# Patient Record
Sex: Male | Born: 1950 | Race: White | Hispanic: No | Marital: Single | Smoking: Former smoker
Health system: Southern US, Community
[De-identification: ages and names within clinical notes are randomized; demographics above are authoritative.]

## PROBLEM LIST (undated history)

## (undated) DIAGNOSIS — M3500 Sicca syndrome, unspecified: Secondary | ICD-10-CM

## (undated) DIAGNOSIS — E039 Hypothyroidism, unspecified: Secondary | ICD-10-CM

## (undated) DIAGNOSIS — I2510 Atherosclerotic heart disease of native coronary artery without angina pectoris: Secondary | ICD-10-CM

## (undated) DIAGNOSIS — J449 Chronic obstructive pulmonary disease, unspecified: Secondary | ICD-10-CM

## (undated) DIAGNOSIS — I2699 Other pulmonary embolism without acute cor pulmonale: Secondary | ICD-10-CM

## (undated) DIAGNOSIS — Z720 Tobacco use: Secondary | ICD-10-CM

## (undated) DIAGNOSIS — G7000 Myasthenia gravis without (acute) exacerbation: Secondary | ICD-10-CM

## (undated) DIAGNOSIS — J479 Bronchiectasis, uncomplicated: Secondary | ICD-10-CM

## (undated) DIAGNOSIS — K219 Gastro-esophageal reflux disease without esophagitis: Secondary | ICD-10-CM

## (undated) DIAGNOSIS — F418 Other specified anxiety disorders: Secondary | ICD-10-CM

## (undated) DIAGNOSIS — I951 Orthostatic hypotension: Secondary | ICD-10-CM

## (undated) HISTORY — PX: OTHER SURGICAL HISTORY: SHX169

---

## 2013-08-13 DIAGNOSIS — I951 Orthostatic hypotension: Secondary | ICD-10-CM | POA: Insufficient documentation

## 2013-12-28 ENCOUNTER — Inpatient Hospital Stay
Admit: 2013-12-28 | Discharge: 2013-12-30 | Discharge status: Other Acute Inpatient Hospital | Source: Other Acute Inpatient Hospital

## 2013-12-28 DIAGNOSIS — R55 Syncope and collapse: Secondary | ICD-10-CM | POA: Insufficient documentation

## 2013-12-28 DIAGNOSIS — M4802 Spinal stenosis, cervical region: Secondary | ICD-10-CM | POA: Insufficient documentation

## 2013-12-28 MED ORDER — venlafaxine (EFFEXOR) tablet 75 mg
75 | Freq: Three times a day (TID) | ORAL | Status: AC
Start: 2013-12-28 — End: 2013-12-29
  Administered 2013-12-29: 12:00:00 75 mg via ORAL

## 2013-12-28 MED ORDER — finasteride (PROSCAR) tablet 5 mg
5 | Freq: Every day | ORAL | Status: AC
Start: 2013-12-28 — End: 2013-12-30
  Administered 2013-12-29 – 2013-12-30 (×2): 5 mg via ORAL

## 2013-12-28 MED ORDER — albuterol 2.5 mg in 3 ml NS NEB inhalation
2.5 | RESPIRATORY_TRACT | Status: AC | PRN
Start: 2013-12-28 — End: 2013-12-30

## 2013-12-28 MED ORDER — pyridostigmine (MESTINON) tablet 30 mg
60 | Freq: Three times a day (TID) | ORAL | Status: AC
Start: 2013-12-28 — End: 2013-12-30
  Administered 2013-12-29 – 2013-12-30 (×6): 30 mg via ORAL

## 2013-12-28 MED ORDER — heparin (porcine) injection 5,000 Units
5000 | Freq: Three times a day (TID) | INTRAMUSCULAR | Status: AC
Start: 2013-12-28 — End: 2013-12-30
  Administered 2013-12-29 (×2): 5000 [IU] via SUBCUTANEOUS

## 2013-12-28 MED ORDER — sodium chloride 0.9 % flush 10 mL
INTRAMUSCULAR | Status: AC
Start: 2013-12-28 — End: 2013-12-30
  Administered 2013-12-29 – 2013-12-30 (×5): 10 mL via INTRAVENOUS

## 2013-12-28 MED ORDER — buPROPion (WELLBUTRIN) tablet 150 mg
75 | Freq: Two times a day (BID) | ORAL | Status: AC
Start: 2013-12-28 — End: 2013-12-30
  Administered 2013-12-29 – 2013-12-30 (×4): 150 mg via ORAL

## 2013-12-28 MED ORDER — montelukast (SINGULAIR) tablet 10 mg
10 | Freq: Every evening | ORAL | Status: AC
Start: 2013-12-28 — End: 2013-12-30
  Administered 2013-12-29 – 2013-12-30 (×2): 10 mg via ORAL

## 2013-12-28 MED ORDER — ondansetron (ZOFRAN) 4 mg/2 mL injection 4 mg
4 | Freq: Four times a day (QID) | INTRAMUSCULAR | Status: AC | PRN
Start: 2013-12-28 — End: 2013-12-30

## 2013-12-28 MED ORDER — mometasone-formoterol (DULERA HFA) 100-5 mcg/actuation inhaler 2 puff
100-5 | Freq: Two times a day (BID) | RESPIRATORY_TRACT | Status: AC
Start: 2013-12-28 — End: 2013-12-30
  Administered 2013-12-29 – 2013-12-30 (×4): 2 via RESPIRATORY_TRACT

## 2013-12-28 MED ORDER — atorvastatin (LIPITOR) tablet 10 mg
10 | Freq: Every evening | ORAL | Status: AC
Start: 2013-12-28 — End: 2013-12-30
  Administered 2013-12-29 – 2013-12-30 (×2): 10 mg via ORAL

## 2013-12-28 MED ORDER — aspirin EC tablet 81 mg
81 | Freq: Every day | ORAL | Status: AC
Start: 2013-12-28 — End: 2013-12-30
  Administered 2013-12-29 – 2013-12-30 (×2): 81 mg via ORAL

## 2013-12-28 MED ORDER — azaTHIOprine (IMURAN) tablet 200 mg
50 | Freq: Every evening | ORAL | Status: AC
Start: 2013-12-28 — End: 2013-12-30
  Administered 2013-12-29 – 2013-12-30 (×2): 200 mg via ORAL

## 2013-12-28 MED ORDER — azaTHIOprine (IMURAN) tablet 150 mg
50 | Freq: Every evening | ORAL | Status: AC
Start: 2013-12-28 — End: 2013-12-28

## 2013-12-28 MED ORDER — ondansetron (ZOFRAN) tablet 4 mg
4 | Freq: Four times a day (QID) | ORAL | Status: AC | PRN
Start: 2013-12-28 — End: 2013-12-30

## 2013-12-28 MED ORDER — tamsulosin (FLOMAX) capsule Cp24 0.4 mg
0.4 | Freq: Every evening | ORAL | Status: AC
Start: 2013-12-28 — End: 2013-12-30
  Administered 2013-12-29 – 2013-12-30 (×2): 0.4 mg via ORAL

## 2013-12-28 MED ORDER — cyanocobalamin (VITAMIN B-12) tablet 1,000 mcg
1000 | Freq: Every day | ORAL | Status: AC
Start: 2013-12-28 — End: 2013-12-30
  Administered 2013-12-29 – 2013-12-30 (×2): 1000 ug via ORAL

## 2013-12-28 MED ORDER — pantoprazole (PROTONIX) EC tablet 40 mg
40 | Freq: Every day | ORAL | Status: AC
Start: 2013-12-28 — End: 2013-12-30
  Administered 2013-12-29 – 2013-12-30 (×2): 40 mg via ORAL

## 2013-12-28 MED ORDER — levothyroxine (SYNTHROID, LEVOTHROID) tablet 224 mcg
112 | Freq: Every day | ORAL | Status: AC
Start: 2013-12-28 — End: 2013-12-30
  Administered 2013-12-29 – 2013-12-30 (×2): 224 ug via ORAL

## 2013-12-28 MED ORDER — midodrine (PROAMATINE) tablet 10 mg
10 | Freq: Three times a day (TID) | ORAL | Status: AC
Start: 2013-12-28 — End: 2013-12-30
  Administered 2013-12-29 – 2013-12-30 (×6): 10 mg via ORAL

## 2013-12-28 MED FILL — PYRIDOSTIGMINE BROMIDE 60 MG TABLET: 60 60 mg | ORAL | Qty: 1

## 2013-12-28 MED FILL — DULERA 100 MCG-5 MCG/ACTUATION HFA AEROSOL INHALER: 100-5 100-5 mcg/actuation | RESPIRATORY_TRACT | Qty: 8.8

## 2013-12-28 MED FILL — ATORVASTATIN 10 MG TABLET: 10 10 MG | ORAL | Qty: 1

## 2013-12-28 MED FILL — BUPROPION HCL 75 MG TABLET: 75 75 MG | ORAL | Qty: 2

## 2013-12-28 MED FILL — HEPARIN (PORCINE) 5,000 UNIT/ML INJECTION SOLUTION: 5000 5,000 unit/mL | INTRAMUSCULAR | Qty: 1

## 2013-12-28 MED FILL — TAMSULOSIN 0.4 MG CAPSULE: 0.4 0.4 mg | ORAL | Qty: 1

## 2013-12-28 MED FILL — LEVOTHYROXINE 112 MCG TABLET: 112 112 MCG | ORAL | Qty: 2

## 2013-12-28 MED FILL — MONTELUKAST 10 MG TABLET: 10 10 mg | ORAL | Qty: 1

## 2013-12-28 MED FILL — MIDODRINE 10 MG TABLET: 10 10 MG | ORAL | Qty: 1

## 2013-12-28 MED FILL — AZATHIOPRINE 50 MG TABLET: 50 50 mg | ORAL | Qty: 4

## 2013-12-28 NOTE — Unmapped (Signed)
Please see my note from today

## 2013-12-28 NOTE — Unmapped (Signed)
[facility]    Name: Andre Olson  MRN: [id number]  CSN: [id number]    Chief complaint:  Syncope    History of Present Illness:    Andre Olson is an 63 y.o. male  has a past medical history of Coronary artery disease; Cancer; BPH; COPD (chronic obstructive pulmonary disease); Hypothyroid; Myasthenia gravis; Orthostatic hypotension; Hypertension; GERD and Depression. admitted with orthostasis and cervical spine disease.    [facility] after noting abnl on cervical spine MRI.  He was initially seen for this 20 years ago in [place] but did not have sx.    He has been c/o orthostasis since March 2015 resulting in 4 episodes of LOC. Cardiac w/u incl ECHO, tilt table testing.  Was treated w midrine and cont to have sx so fluorinef was tried and had resulting high BP causing vision changes; did not feel well so came to [facility] after [facility] did not want to admit him.  He did start taking the effexor around the time his sx started or shortly after, 75 mg tid (short acting)    +fatigue, intermittent total bilateral upper arm numbness, stocking numbness of LE, tremor  -at the [facility] neuro was considering doing a     PMH:  ROS negative except for: noted above  PMH reviewed from resident note  PSH reviewed from resident note  [facility] reviewed from resident note  SOC: living situation  -tobacco: 36 pl yr, quit 2001  -alcohol: 12-14 cans of beer per week  -drug use: none  Meds   No current facility-administered medications on file prior to encounter.     No current outpatient prescriptions on file prior to encounter.     No Known Allergies      Vitals:     Temp:  [97.1 ??F (36.2 ??C)-97.6 ??F (36.4 ??C)] 97.6 ??F (36.4 ??C)  Heart Rate:  [58-66] 66  Resp:  [16-18] 16  BP: (118-140)/(63-75) 123/74 mmHg    Intake/Output Summary (Last 24 hours) at 12/29/13 1422  Last data filed at 12/29/13 1133   Gross per 24 hour   Intake    620 ml   Output   1500 ml      Net   -880 ml     EXAM: thin, NAD  ENT EOMI  SKIN: no rash or lesion  LYMPH: neg cervical, supraclavicular LAN  Neck: kyphosis moderate, scar over thyroid  Lungs:  BS normal  Heart: RRR, 2/6 sys murmur RUSB, no heart sounds  Abd soft, -HSM, nt nd  Ext: -cce  Neuro: DTRs symmetrical: 1+ biceps, 2+ patellar, plantar down going, no clocus, course tremor        Labs:    Laboratory:  Lab Results   Component Value Date    WBC 6.7 12/29/2013    HGB 15.2 12/29/2013    HCT 44.3 12/29/2013    MCV 94.1 12/29/2013    PLT 177 12/29/2013     Lab Results   Component Value Date    GLUCOSE 86 12/29/2013    CREATININE 1.10 12/29/2013    BUN 23 12/29/2013    NA 135 12/29/2013    K 4.0 12/29/2013    CL 101 12/29/2013    CO2 27 12/29/2013     Lab Results   Component Value Date    INR 1.0 12/29/2013    PROTIME 12.6 12/29/2013     12/27/13 MRA HEAD w/o Findings:  Brain MRI:   No acute territorial or brainstem infarcts are present. No focal areas of diffusion restriction are present. There is age-appropriate mild periventricular and subcortical T2 FLAIR hyperintensities consistent with chronic small vessel ischemic  disease. No significant abnormal white matter signal abnormalities are present. There is no evidence of cytotoxic or vasogenic edema. No acute intracerebral hemorrhage, mass effect or extra-axial fluid collection is present.     The ventricles and cisterns are normal in size, shape, and configuration. The corpus callosum, pituitary and the optic   chiasm appear normal. The sulci and gyri are unremarkable. The cerebellar tonsils are normal in position.  The anterior and posterior circulation arteries show expected normal flow voids. The dural venous sinuses are patent.  The orbital tissue planes are normal. The paranasal sinuses and the mastoid air cells are clear.   Brain MRA:   There is no ICA stenosis at the skull base. Both petrous and cavernous ICAs are normally patent. Both carotid termini and   paraclinoid ICAs are normally patent.  Both A1 and A2 ACAs are patent. The left A1 ACA is dominant. The anterior communicating artery is unremarkable. Both M1 and M2 MCAs are normally patent.  There is no MCA bifurcation aneurysm. The visualized posterior communicating arteries are unremarkable.  The right vertebral artery is moderately dominant. The left vertebral artery is diminutive. Both vertebral arteries join to form a normally patent basilar artery. There is no basilar apex aneurysm. Both posterior cerebral arteries are normally patent. There is no PICA aneurysm.   Neck MRA:   There is normal 3 vessel aortic arch. The great vessels arising from the arch shows no ostial stenosis. No evidence of subclavian artery stenosis. The bilateral common carotid arteries and both cervical internal carotid arteries are normally patent. There is no stenosis or occlusion. The right vertebral artery is moderately dominant. The left vertebral artery is markedly diminutive. Both vertebral arteries show normal antegrade flow. There is no flow-limiting vertebral or carotid artery dissection.   Impression:   There is no acute intracerebral hemorrhage, enhancing mass lesion or acute territorial infarct.   The anterior and posterior cerebral circulation arteries show no major branch occlusion.   The bilateral cervical internal carotid arteries show no hemodynamically significant focal diameter stenosis.   There is no flow-limiting vertebral or carotid artery dissection   12/27/13 XRAY CSPINE:Impression:   1. Anatomical alignment. Normal craniovertebral junction. No  acute fracture or traumatic subluxation.   2. 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   3. Upon flexion there is minimal 1 mm anterolisthesis of C2 on C3 likely on degenerative basis.   07/27/15MRI NECK:Findings: The alignment is anatomic. There is straightening of the cervical spine. The craniovertebral junction is normal. There is no tonsillar herniation.  The craniovertebral junction is normal. There is diffuse disc desiccation. Disc space narrowing is present at C5-C6 and C6-C7. There is no prevertebral soft tissue swelling. There is no acute fracture, traumatic listhesis or canal compromise by fracture.   C2-C3: There is mild right and moderate left foraminal stenosis due to uncovertebral DJD.   C3-C4: There is focal posterocentral disc protrusion compressing the thecal sac and mildly indenting the ventral aspect of the cord. There is no significant foraminal stenosis.   C4-C5: There is mild broad-based annular bulge. There is minor foraminal encroachment due to uncovertebral DJD. There is minimal 1 mm anterolisthesis of C4 in relation to C5.   C5-C6: There is posterior central osteophyte  compressing the thecal sac and indenting the ventral aspect of the cord. There is severe bilateral foraminal stenosis with probable impingement of the exiting C6 nerve roots on both sides. There is minimal 1 mm retrolisthesis of C5 in relation to C6 on degenerative basis.   C6-C7: There is posterior central osteophyte compressing the thecal sac and mildly indenting the ventral aspect of the cord.   There is no canal stenosis. There is severe bilateral foraminal stenosis due to uncovertebral DJD with possible impingement of the exiting C7 nerve roots. There is 2 mm retrolisthesis of C6 in relation to C7 on a degenerative basis.   C7-T1: Minor degenerative changes The cervical cord shows heterogenous intramedullary T2 signal. There is however no focal cord edema, syrinx cavitation or cord enlargement.   Impression:   Moderate multilevel spondyloarthrosis, most significant at C3-C4, C5-C6 and C6-C7 as described.   At C5-C6 and C6-C7 posterior central osteophytes are present resulting in spinal canal stenosis and mild indentation of the ventral aspect of the cord. There is foraminal stenosis with probable impingement of the exiting bilateral C6 and C7 nerve   roots.   There is no  acute fracture, traumatic subluxation or canal   compromise by fracture.         Assessment/Plan:      Principal Problem:    Syncope: cardiac w/u is negative, unlikely to be related to spinal disease if cord is not significantly affected.  Recommend d/c of effexor since 10% of patients will have orthostasis from this medication, observe off midodrine and florinef for now    Active Problems:    Stenosis, cervical spine, read as mild (see above) NS to see    Myasthenia gravis with exacerbation, adult form: care per [facility] neurology          I performed a history and physical examination of the patient and discussed the management with the residents and patient. I reviewed the resident's note and agree with the documented findings and plan of care.   Graylin Shiver, MD  12/29/2013  2:22 PM

## 2013-12-28 NOTE — Unmapped (Signed)
Pt admitted to [facility] to room 8034. Pt's vss, a/o, with no c/o pain at this time, will continue to monitor. Pt oriented to call light system and instructed to call for assistance.  Patient is resting in a low, locked bed with call light and bedside table within reach.

## 2013-12-28 NOTE — Unmapped (Signed)
[place] [facility]  Department of Medicine  History and Physical    Admitting Physician: Leia Alf, MD  Date of Admit: 12/28/2013    Subjective:     Chief Complaint:  63 yo male with PMH significant for seronegative Myasthenia Gravis presents as transfer from [place] with CC of syncope and bilateral arm numbness.     HPI: Patient endorses 4 episodes of full loss of consciousness for several seconds since March that seem to be progressing in severity and frequency. It is associated with positional changes as well as shortness of breath and vision problems similar to previous MG exacerbations. He was admitted to [facility] for 2 weeks for these episodes and diagnosed with orthostatic hypotension. Per patient, tilt table testing and multiple imaging studies including echos, carotids, MRI brain have all been normal. He was discharged on midodrine therapy but was later readmitted for a blood pressure of 64/45. Marland Kitchen He was started on florinef but developed a blood pressure of 198/107 with a headache, tingling around his scalp and back, and flushing and then was admitted to the [place]. At the [facility], he was worked up for potential causes of syncope including a MRI/MRA that revealed foraminal stenosis with probable impingement of the exiting bilateral C6 and C7 nerve roots. for which he transferred to [facility] for neurosurgical evaluation.     Patient continues to endorse neck pain with head rotation bilaterally and consistent orthostasis on standing. On further questioning, he reports a diagnosis of cervical stenosis in 1990s and followed with ortho in [place] for 5 years, but they said it wasn't severe enough for surgery. He endorses intermittent right arm numbness and weakness during this time.     He also endorses worsening of his double vision, which he states has been constant for years. Patient also thinks that his voice has become slowly softer and more hoarse in the last several  months in addition to increasing fatigue with deep inspiration/expiration. Stopped pyridostigmine 3 years ago because of associated nausea.       Past Medical History:    GERD  papillary thyroid CA s/p ablation  Hypothyroid  COPD  CAD (s/p stent to RCA 05/2012)  Ureteral stricture  BPH    Past Surgical History:    Ulnar nerve transposition - 2008 [facility] [place])  RCA stent placement - 2013  Dilation of Urethra   Ablation of Thyroid cancer - 1995    Medications:    1) AZATHIOPRINE TAB ACTIVE Strt:12-25-13   Give: 150 MG PO Q BEDTIME Stop:01-24-14     2) AZELASTINE INHL,NASAL ACTIVE Strt:12-25-13   Give: 2 PUFFS INH NAS BID Stop:01-24-14     3) BUDESONIDE/FORMOTEROL INHL,ORAL ACTIVE Strt:12-25-13   Give: 2 PUFFS INHL BID Stop:01-24-14     4) BUPROPION (SR 12-HR) TAB,SA ACTIVE Strt:12-27-13   Give: 150MG  PO BID Stop:01-24-14     5) CYANOCOBALAMIN TAB ACTIVE Strt:12-25-13   Give: PO QDAY Stop:01-24-14     6) FINASTERIDE TAB ACTIVE Strt:12-25-13   Give: 5MG  PO QDAY Stop:01-24-14      8) IPRATROPIUM SOLN,SPRAY,NASAL ACTIVE Strt:12-25-13   Give: 2 SPRAYS INH NAS TID Stop:01-01-14     9) LEVOTHYROXINE TAB ACTIVE Strt:12-26-13   Give: 0.224MG  PO QDAY7 Stop:01-25-14     10) MIDODRINE TAB ACTIVE Strt:12-25-13   Give: 10MG  PO TID Stop:01-24-14     11) MONTELUKAST TAB ACTIVE Strt:12-25-13   Give: 10MG  PO Q BEDTIME Stop:01-24-14      12) MULTIVITAMIN/OPHTH (  AREDS2) CAP/TAB ACTIVE Strt:12-25-13   Give: 1 CAP/TAB PO BID Stop:01-24-14   (FOR EYES)     13) PANTOPRAZOLE TAB,EC ACTIVE Strt:12-25-13   Give: 40MG  PO QDAY Stop:01-24-14     14) PRAVASTATIN TAB ACTIVE Strt:12-27-13   Give: 40MG  PO Q BEDTIME Stop:01-24-14     15) PYRIDOSTIGMINE TAB ACTIVE Strt:12-26-13   Give: 30MG  PO TID Stop:01-25-14     16) TAMSULOSIN CAP,ORAL ACTIVE Strt:12-25-13   Give: 0.4MG  PO Q BEDTIME Stop:01-24-14     17) VENLAFAXINE TAB ACTIVE Strt:12-25-13   Give: 75MG  PO TID Stop:01-24-14      Code Status:  Full Code    Allergies: NKDA    Social  history:   Living situation: Lives in [place], with his fiancee Debbie   Social/Occupation: Retired (but still mows lawns)   Tobacco:  1 ppd for 35 years   Alcohol: 1-2 beers per day   Illicit Drugs: none       Family history:  Non-contributory    Review of Systems:    Constitutional: Negative for fever, chills and unexpected weight change. +fatigue  Eyes: +blurry and double vision.   Respiratory: Baseline COPD shortness of breath, cough stable,   Cardiovascular: Intermittent chest pain with indigestion couple of times a month  Gastrointestinal: Negative for nausea, vomiting, constipation and blood in stool.  Genitourinary: Negative for hematuria.   Musculoskeletal: +Arthritis in thumbs; +Neck pain  Skin: Negative for rash and wound.   Neurological: +dizziness, syncope, light-headedness and headaches on positional changes      Objective:   Temp:  [97.5 ??F (36.4 ??C)] 97.5 ??F (36.4 ??C)  Heart Rate:  [65] 65  BP: (127)/(73) 127/73 mmHg  No intake or output data in the 24 hours ending 12/28/13 2013       General: Alert, well nourished, in no acute distress  HEENT: PERRL  Neck: Supple, no JVD  Lungs: Normal effort, CTAB, no rubs, crackles, or wheezes  Heart:  Regular rate and rhythm; Very soft heart sounds; +1/6 systolic murmur heard best over LSB. No carotid bruits appreciated  Abd: Abdomen soft and non-tender. BS+  Ext: No edema, 2+ pulses bilat in UE/LE.  Skin: Normal color, no rashes, 1 tattoo on each of forearms    Mental Status:     LOC/Orientation: Awake and alert. Oriented x4 to self, place, date and situation  Language/Speech: no aphasia and no dysarthria.    Cranial Nerve II - XII grossly intact    Motor:    R L   R L   Deltoid 5 5  Hip Flex 5 5   Biceps 5 5  Knee Ext 5 5   Triceps 5 5  Knee Flex 5 5   Wrist Ext 5 5  Plantar Flex 5 5   Wrist Flex 5 5  Ankle Dorsiflex 5 5   IO 5 5       Grip 5 5         Sensory: B/l sensation to pinprick diminished up to ankles; B/l sensation to light touch diminished up to  mid-shins. Endorses intermittent numbness and tingling in BUE that extends up to his shoulders  Coord: finger nose finger  normal bilateral, finger tapping normal bilateral and heel to shin ; somewhat limited to exertion fatigue. Mild tremor bilaterally with extended effort  Gait: Did not attempt gait testing. Became dizzy when transitioning to sitting position from supine position.    Deep Tendon Reflexes:    R L   Biceps 2  2   Patellar 2+ 2+   Achilles 2 2   Toes ? ?     Clonus: Normal tone, no clonus  Dysarthia:Hoarseness, soft speech upon long conversations    Laboratory Data: CBC, Renal Panel, EKG      Assessment & Plan:   This is a 63 y.o. male with PMH significant for seronegative Myasthenia Gravis presents as transfer from [place] with CC of syncope and bilateral arm numbness.       1. Orthostatic hypotension:  [facility]: patient's peripheral neuropathy and orthostatic hypotension may have an autoimmune etiology related to his MG.   -Per [facility] note: MG Ab panel, IVIG  -Neurology consulted; appreciate recommendations  -10 mg Midrodrine daily  -Possible discontinuation of venlafaxine for contribution to orthostasis    3. Syncope: Given association of position changes and hypotension, syncope most likely 2/2 orthostatic hypotension  -Consider Echo to r/o cardiogenic syncope  -f/u OSH records for tilt-table testing, carotid US    4. Cervical Stenosis: Endorses symmetric parasthesias in BUE with OSH MRI positive for cervical stenosis  -Neurosurgery consulted; Appreciate recs  -Monitor for focal neurologic deficit and vascular compromise    5. Myasthenia Gravis: diplopia, hypophonia that increases with sustained effort  -Appreciate Neurology Recs  -30 mg Pyridostigmine PO TID  -200 mg Azathioprine daily   -Zofran TID PRN  -obtain baseline Vital Capacity and Maximal Inspiratory Pressure  -Monitor for Myasthenic Crisis --> Dyspnea while supine, severe dysphagia, increased WOB ==> low threshold for intubation    6.  Peripheral Neuropathy  -w/u ongoing at [place]; f/u on TSH, SPEP, ANA, ESR, RPR, B12    7. Hypothyroid  -Continue Synthroid    8. Smoking Cessation  -Continue Wellbutrin    9. CAD  -Continue Statin  -Consider ASA    10. GERD  -Continue Protonix    11. BPH  -Continue Tamulosin, Finasteride     12. Depression  -Continue Venlafaxine    13. COPD  -Continue dulera, montelukast, +albuterol prn Q4    14. Vitamin B12 Deficiency  -Continue B12 dose    15. PPX  -SubQ Heparin, Fall Precautions      Damaris Geers,   8:13 PM, 12/28/2013

## 2013-12-28 NOTE — Unmapped (Signed)
[facility]  DEPARTMENT OF INTERNAL MEDICINE  HISTORY & PHYSICAL  Patient: Andre Olson  MRN: [id number]  Admitting Physician: Leia Alf, MD  Date of Admit: 12/28/2013    Chief Complaint     Cervical cord compression    History of Present Illness     Andre Olson is a 63 y.o. male with a history significant for seronegative Myasthenia Gravis, BPH, HTN and hx of orthostatic hypotension who is transferred from [facility] for neurosurgical evaluation of cervical cord compression.    Pt was being worked up and follow for orthostatic hypotension at [facility] by neurology. Recent admission to [facility] where he was diagnosed. He had 4 episodes with LOC for several seconds since 08/2013. It is associated with positional changes as well as shortness of breath and vision problems similar to previous MG exacerbations. At OSH he reports multiple tests including; TTE, tilt-table and MRI were within nl. He was discharged on midodrine and later readmitted for low blood pressures. Started on florinef. He monitors his BP at home and noted a bp >200/>100 and went to [facility] ED. Syncopal workup there was done including MRI/MRA which revealed likely impingement of b/l C6 and C7 nerve roots, for which he was transferred to [facility] for NSG eval.     He states he was previously diagnosed with cervical canal stenosis in the 90s in [place], but was not severe enough for surgery. Also hx of R arm numbness/tingling for which he underwent ulnar nerve release.       Review of Systems     Review of Systems   Constitutional: Positive for malaise/fatigue. Negative for fever, chills and weight loss.   HENT: Negative for nosebleeds and sore throat.    Eyes: Positive for blurred vision and double vision. Negative for pain.   Respiratory: Negative for cough, shortness of breath and wheezing.    Cardiovascular: Negative for chest pain, palpitations and orthopnea.   Gastrointestinal: Positive for heartburn. Negative for nausea,  vomiting, abdominal pain and diarrhea.   Genitourinary: Negative for dysuria, urgency and frequency.   Musculoskeletal: Positive for neck pain. Negative for back pain, joint pain and myalgias.   Skin: Negative for rash.   Neurological: Positive for dizziness, tingling and loss of consciousness. Negative for seizures and headaches.   Psychiatric/Behavioral: Negative for depression. The patient is not nervous/anxious.          Past Medical History     Past Medical History   Diagnosis Date   ??? Coronary artery disease    ??? Cancer        Past Surgical History     Past Surgical History   Procedure Laterality Date   ??? Total thyroidectomy  March 1995       Family History     Non-contribuatory    Social History     History     Social History   ??? Marital Status: Single     Spouse Name: N/A     Number of Children: N/A   ??? Years of Education: N/A     Occupational History   ??? Not on file.     Social History Main Topics   ??? Smoking status: Former Smoker -- 1.00 packs/day for 36 years     Types: Cigarettes     Quit date: 02/02/2000   ??? Smokeless tobacco: Not on file   ??? Alcohol Use: 7.2 - 8.4 oz/week     12-14 Cans of beer per week   ???  Drug Use: No   ??? Sexual Activity: Yes     Partners: Female     Other Topics Concern   ??? Not on file     Social History Narrative   ??? No narrative on file       Medications     Allergies:  No Known Allergies    Home Meds:  Prior to Admission medications    Medication Sig Start Date End Date Taking? Authorizing Provider   azaTHIOprine (IMURAN) 50 mg tablet Take 150 mg by mouth at bedtime.   Yes Historical Provider, MD   azelastine (ASTELIN) 137 mcg (0.1 %) nasal spray Use 2 sprays into each nostril 2 times a day. Use in each nostril as directed   Yes Historical Provider, MD   budesonide-formoterol (SYMBICORT) 80-4.5 mcg/actuation inhaler Inhale 2 puffs into the lungs 2 times a day.   Yes Historical Provider, MD   buPROPion SR (WELLBUTRIN SR) 150 MG tablet Take 150 mg by mouth 2 times a day.   Yes  Historical Provider, MD   cyanocobalamin (VITAMIN B-12) 1000 MCG tablet Take 1,000 mcg by mouth daily.   Yes Historical Provider, MD   finasteride (PROSCAR) 5 mg tablet Take 5 mg by mouth daily.   Yes Historical Provider, MD   ipratropium (ATROVENT) 0.03 % nasal spray Use 2 sprays into each nostril every 12 (twelve) hours.   Yes Historical Provider, MD   levothyroxine (SYNTHROID, LEVOTHROID) 112 MCG tablet Take 224 mcg by mouth daily.   Yes Historical Provider, MD   midodrine (PROAMATINE) 10 MG tablet Take 10 mg by mouth 3 times a day.   Yes Historical Provider, MD   montelukast (SINGULAIR) 10 mg tablet Take 10 mg by mouth at bedtime.   Yes Historical Provider, MD   multivitamin capsule Take 1 capsule by mouth daily.   Yes Historical Provider, MD   pantoprazole (PROTONIX) 40 MG tablet Take 40 mg by mouth every morning before breakfast.   Yes Historical Provider, MD   pravastatin (PRAVACHOL) 40 MG tablet Take 40 mg by mouth at bedtime.   Yes Historical Provider, MD   pyridostigmine (MESTINON) 60 mg tablet Take 60 mg by mouth 3 times a day.   Yes Historical Provider, MD   tamsulosin (FLOMAX) 0.4 mg Cp24 Take 0.4 mg by mouth at bedtime.   Yes Historical Provider, MD   venlafaxine (EFFEXOR) 75 MG tablet Take 75 mg by mouth 3 times a day.   Yes Historical Provider, MD        Inpatient Meds:  Scheduled:  ??? atorvastatin  10 mg Oral Nightly (2100)   ??? azaTHIOprine  200 mg Oral QHS   ??? buPROPion  150 mg Oral BID   ??? [START ON 12/29/2013] cyanocobalamin  1,000 mcg Oral Daily 0900   ??? [START ON 12/29/2013] finasteride  5 mg Oral Daily 0900   ??? heparin (porcine)  5,000 Units Subcutaneous Q8H   ??? [START ON 12/29/2013] levothyroxine  224 mcg Oral DAILY 0600   ??? midodrine  10 mg Oral TID   ??? mometasone-formoterol  2 puff Inhalation RT Q12H   ??? montelukast  10 mg Oral Nightly (2100)   ??? [START ON 12/29/2013] pantoprazole  40 mg Oral DAILY 0600   ??? pyridostigmine  30 mg Oral TID   ??? sodium chloride  10 mL Intravenous QS   ??? tamsulosin   0.4 mg Oral Nightly (2100)   ??? [START ON 12/29/2013] venlafaxine  75 mg Oral TID WC  Continuous:     [id number] 2.5 mg in 3 ml NS NEB inhalation, ondansetron, ondansetron    Vital Signs     Temp:  [97.5 ??F (36.4 ??C)] 97.5 ??F (36.4 ??C)  Heart Rate:  [65] 65  Resp:  [16] 16  BP: (127)/(73) 127/73 mmHg  No intake or output data in the 24 hours ending 12/28/13 2249    Physical Exam     Physical Exam   Constitutional: He is oriented to person, place, and time. He appears well-developed and well-nourished. No distress.   HENT:   Head: Normocephalic and atraumatic.   Mouth/Throat: No oropharyngeal exudate.   Eyes: EOM are normal. Pupils are equal, round, and reactive to light. No scleral icterus.   Neck: Normal range of motion. Neck supple. No JVD present. No thyromegaly present.   Cardiovascular: Normal rate and regular rhythm.    Murmur heard.  2/6 holosystolic murmur best heard of LUSB   Pulmonary/Chest: Effort normal and breath sounds normal. No respiratory distress. He has no wheezes.   Abdominal: Soft. Bowel sounds are normal. He exhibits no distension. There is no tenderness.   Musculoskeletal: Normal range of motion. He exhibits no tenderness.   Neurological: He is alert and oriented to person, place, and time. He has normal strength and normal reflexes. No cranial nerve deficit or sensory deficit. He exhibits normal muscle tone.   Skin: Skin is warm and dry. No rash noted.   Psychiatric: He has a normal mood and affect. His behavior is normal. Judgment normal.         Laboratory Data     CBC:   HCT: 44.4 (12/26/13 06:00)   HGB: 15.2 (12/26/13 06:00)   MCH: 31.9 (12/26/13 06:00)   MCHC: 34.2 (12/26/13 06:00)   MCV: 93.3 (12/26/13 06:00)   MPV: 9.4 (12/26/13 06:00)   PLT: 211 (12/26/13 06:00)   RBC: 4.76 (12/26/13 06:00)   RDW-CV: 12.4 (12/26/13 06:00)   WBC: 6.7 (12/26/13 06:00)     ANI GAP: 12 (11/16/12 06:00)   BUN: 10 (11/16/12 06:00)   CA: 8.3 (11/16/12 06:00)   CL: 110 (11/16/12 06:00)   CO2: 25  (11/16/12 06:00)   CREAT: 0.96 (11/16/12 06:00)   GLUCOSE: 93 (11/16/12 06:00)   K: 4.4 (11/16/12 06:00)   NA: 142 (11/16/12 06:00)   eGFR: >60 (11/16/12 06:00)   MG: 2.0 (12/26/13 06:00)   PO4: 3.7 (12/26/13 06:00)       Diagnostic Studies   From OSH  7/27/15MRI BRAIN AND STEM W/O Contrast:   12/27/13 MRA HEAD w/o Findings:   Brain MRI:   No acute territorial or brainstem infarcts are present. No focal   areas of diffusion restriction are present. There is   age-appropriate mild periventricular and subcortical T2 FLAIR   hyperintensities consistent with chronic small vessel ischemic   disease. No significant abnormal white matter signal   abnormalities are present. There is no evidence of cytotoxic or   vasogenic edema. No acute intracerebral hemorrhage, mass effect   or extra-axial fluid collection is present.   The ventricles and cisterns are normal in size, shape, and   configuration. The corpus callosum, pituitary and the optic   chiasm appear normal. The sulci and gyri are unremarkable. The   cerebellar tonsils are normal in position.   The anterior and posterior circulation arteries show expected   normal flow voids. The dural venous sinuses are patent.   The orbital tissue planes are normal. The paranasal sinuses and  the mastoid air cells are clear.   Brain MRA:   There is no ICA stenosis at the skull base. Both petrous and   cavernous ICAs are normally patent. Both carotid termini and   paraclinoid ICAs are normally patent. Both A1 and A2 ACAs are   patent. The left A1 ACA is dominant. The anterior communicating   artery is unremarkable. Both M1 and M2 MCAs are normally patent.   There is no MCA bifurcation aneurysm. The visualized posterior   communicating arteries are unremarkable.   The right vertebral artery is moderately dominant. The left   vertebral artery is diminutive. Both vertebral arteries join to   form a normally patent basilar artery. There is no basilar apex   aneurysm. Both posterior  cerebral arteries are normally patent.   There is no PICA aneurysm.   Neck MRA:   There is normal 3 vessel aortic arch. The great vessels arising   from the arch shows no ostial stenosis. No evidence of subclavian   artery stenosis. The bilateral common carotid arteries and both   cervical internal carotid arteries are normally patent. There is   no stenosis or occlusion. The right vertebral artery is   moderately dominant. The left vertebral artery is markedly   diminutive. Both vertebral arteries show normal antegrade flow.   There is no flow-limiting vertebral or carotid artery dissection.   Impression:   There is no acute intracerebral hemorrhage, enhancing mass lesion   or acute territorial infarct.   The anterior and posterior cerebral circulation arteries show no   major branch occlusion.   The bilateral cervical internal carotid arteries show no   hemodynamically significant focal diameter stenosis.   There is no flow-limiting vertebral or carotid artery dissection   12/27/13 XRAY CSPINE:Impression:   1. Anatomical alignment. Normal craniovertebral junction. No   acute fracture or traumatic subluxation.   2. 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   3. Upon flexion there is minimal 1 mm anterolisthesis of C2 on C3   likely on degenerative basis.   07/27/15MRI NECK:Findings: The alignment is anatomic. There is straightening of   the cervical spine. The craniovertebral junction is normal. There   is no tonsillar herniation. The craniovertebral junction is   normal. There is diffuse disc desiccation. Disc space narrowing   is present at C5-C6 and C6-C7. There is no prevertebral soft   tissue swelling.   There is no acute fracture, traumatic listhesis or canal   compromise by fracture.   C2-C3: There is mild right and moderate left foraminal stenosis   due to uncovertebral DJD.   C3-C4: There is focal posterocentral disc protrusion compressing   the thecal sac  and mildly indenting the ventral aspect of the   cord. There is no significant foraminal stenosis.   C4-C5: There is mild broad-based annular bulge. There is minor   foraminal encroachment due to uncovertebral DJD. There is minimal   1 mm anterolisthesis of C4 in relation to C5.   C5-C6: There is posterior central osteophyte compressing the   thecal sac and indenting the ventral aspect of the cord. There is   severe bilateral foraminal stenosis with probable impingement of   the exiting C6 nerve roots on both sides. There is minimal 1 mm   retrolisthesis of C5 in relation to C6 on degenerative basis.   C6-C7: There is posterior central osteophyte compressing the   thecal sac and mildly indenting  the ventral aspect of the cord.   There is no canal stenosis. There is severe bilateral foraminal   stenosis due to uncovertebral DJD with possible impingement of   the exiting C7 nerve roots. There is 2 mm retrolisthesis of C6 in   relation to C7 on a degenerative basis.   C7-T1: Minor degenerative changes   The cervical cord shows heterogenous intramedullary T2 signal.   There is however no focal cord edema, syrinx cavitation or cord   enlargement.   Impression:   Moderate multilevel spondyloarthrosis, most significant at C3-C4,   C5-C6 and C6-C7 as described.   At C5-C6 and C6-C7 posterior central osteophytes are present   resulting in spinal canal stenosis and mild indentation of the   ventral aspect of the cord. There is foraminal stenosis with   probable impingement of the exiting bilateral C6 and C7 nerve   roots.   There is no acute fracture, traumatic subluxation or canal   compromise by fracture.         Assessment & Plan     Andre Olson is a 63 y.o. male with Syncope. Medical problems being addressed in this encounter include the following:    Active Hospital Problems    Diagnosis Date Noted   ??? Syncope [780.2] 12/28/2013   ??? 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 Myasthenia gravis with  exacerbation, adult form [358.01] 12/28/2013      Resolved Hospital Problems    Diagnosis Date Noted Date Resolved   No resolved problems to display.     #Orthostatic hypotension  Per neuro, possibly autoimmune component a/w his myasthenia.   - Obtain outside imaging and TTE records  - Neurology consulted, appreciate recs  - continue midodine 10mg  qday  - zofran prn    #Cervical stenosis, with cord impingement  Seen on imaging incidentally at OSH  - NSG consulted, appreciate recs  - serial neuro checks    #Myasthenia Gravis  - Neurology consulted, appreciate recs  - cont Pyridostigmine 30mg  po tid, discuss with neuro increasing dose  - azathioprine 200mg  qhs  - bedside PFTs  - IS, ezpap    #Periphreal neuropathy  Unclear if 2/t cord impingement  - F/u Labs at [facility];  TSH, SPEP, ANA, ESR, RPR, B12    #COPD  Not in exacerbation  - cont home symbicort (therapeutic interchange), singular  - albuterol nebs q4h prn    #hypothyroidism  - cont synthroid    #CAD  - asa 81 daily, lipitor 10 qhs    #Depression  - cont venlafaxine    #GERD  - cont home ppi    #Vitamin b12 deficiency  - cont home b12 supplement     #BPH  - cont home tamulosin, finasteride    #Tobacco abuse  Counseled on tobacco cessation.   - cont Wellbutrin       Nutrition:   Diet Orders         Diet regular starting at 07/28 2114          Code Status:  Full Code      Signed:  Josue Hector, MD  Internal Medicine Resident  12/28/2013, 10:49 PM

## 2013-12-29 ENCOUNTER — Inpatient Hospital Stay: Admit: 2013-12-29 | Attending: Student in an Organized Health Care Education/Training Program

## 2013-12-29 LAB — CBC
Hematocrit: 44.3 % (ref 38.5–50.0)
Hemoglobin: 15.2 g/dL (ref 13.2–17.1)
MCH: 32.2 pg (ref 27.0–33.0)
MCHC: 34.2 g/dL (ref 32.0–36.0)
MCV: 94.1 fL (ref 80.0–100.0)
MPV: 7.9 fL (ref 7.5–11.5)
Platelets: 177 10*3/uL (ref 140–400)
RBC: 4.71 10*6/uL (ref 4.20–5.80)
RDW: 12.8 % (ref 11.0–15.0)
WBC: 6.7 10*3/uL (ref 3.8–10.8)

## 2013-12-29 LAB — RENAL FUNCTION PANEL W/EGFR
Albumin: 4.1 g/dL (ref 3.5–5.7)
Anion Gap: 7 mmol/L (ref 3–16)
BUN: 23 mg/dL (ref 7–25)
CO2: 27 mmol/L (ref 21–33)
Calcium: 9.1 mg/dL (ref 8.6–10.3)
Chloride: 101 mmol/L (ref 98–110)
Creatinine: 1.1 mg/dL (ref 0.60–1.30)
GFR MDRD Af Amer: 81 See note.
GFR MDRD Non Af Amer: 67 See note.
Glucose: 86 mg/dL (ref 70–100)
Osmolality, Calculated: 283 mOsm/kg (ref 278–305)
Phosphorus: 3.6 mg/dL (ref 2.1–4.5)
Potassium: 4 mmol/L (ref 3.5–5.3)
Sodium: 135 mmol/L (ref 133–146)

## 2013-12-29 LAB — PROTIME-INR
INR: 1 (ref 0.9–1.1)
Protime: 12.6 seconds (ref 11.6–14.4)

## 2013-12-29 MED ORDER — acetaminophen (TYLENOL) tablet 650 mg
325 | ORAL | Status: AC | PRN
Start: 2013-12-29 — End: 2013-12-30
  Administered 2013-12-29: 650 mg via ORAL

## 2013-12-29 MED ORDER — venlafaxine (EFFEXOR) tablet 37.5 mg
37.5 | Freq: Three times a day (TID) | ORAL | Status: AC
Start: 2013-12-29 — End: 2013-12-30
  Administered 2013-12-29 – 2013-12-30 (×4): 37.5 mg via ORAL

## 2013-12-29 MED ORDER — venlafaxine (EFFEXOR) tablet 25 mg
25 | Freq: Three times a day (TID) | ORAL | Status: AC
Start: 2013-12-29 — End: 2013-12-30

## 2013-12-29 MED ORDER — venlafaxine (EFFEXOR) tablet 25 mg
25 | Freq: Two times a day (BID) | ORAL | Status: AC
Start: 2013-12-29 — End: 2013-12-30

## 2013-12-29 MED FILL — LEVOTHYROXINE 112 MCG TABLET: 112 112 MCG | ORAL | Qty: 2

## 2013-12-29 MED FILL — AZATHIOPRINE 50 MG TABLET: 50 50 mg | ORAL | Qty: 4

## 2013-12-29 MED FILL — VENLAFAXINE 75 MG TABLET: 75 75 MG | ORAL | Qty: 1

## 2013-12-29 MED FILL — MIDODRINE 10 MG TABLET: 10 10 MG | ORAL | Qty: 1

## 2013-12-29 MED FILL — BUPROPION HCL 75 MG TABLET: 75 75 MG | ORAL | Qty: 2

## 2013-12-29 MED FILL — TAMSULOSIN 0.4 MG CAPSULE: 0.4 0.4 mg | ORAL | Qty: 1

## 2013-12-29 MED FILL — PYRIDOSTIGMINE BROMIDE 60 MG TABLET: 60 60 mg | ORAL | Qty: 1

## 2013-12-29 MED FILL — FINASTERIDE 5 MG TABLET: 5 5 mg | ORAL | Qty: 1

## 2013-12-29 MED FILL — MONTELUKAST 10 MG TABLET: 10 10 mg | ORAL | Qty: 1

## 2013-12-29 MED FILL — VENLAFAXINE 37.5 MG TABLET: 37.5 37.5 MG | ORAL | Qty: 1

## 2013-12-29 MED FILL — ATORVASTATIN 10 MG TABLET: 10 10 MG | ORAL | Qty: 1

## 2013-12-29 MED FILL — VITAMIN B-12  1,000 MCG TABLET: 1000 1000 MCG | ORAL | Qty: 1

## 2013-12-29 MED FILL — PANTOPRAZOLE 40 MG TABLET,DELAYED RELEASE: 40 40 MG | ORAL | Qty: 1

## 2013-12-29 MED FILL — TYLENOL 325 MG TABLET: 325 325 mg | ORAL | Qty: 2

## 2013-12-29 MED FILL — ASPIRIN 81 MG TABLET,DELAYED RELEASE: 81 81 MG | ORAL | Qty: 1

## 2013-12-29 MED FILL — HEPARIN (PORCINE) 5,000 UNIT/ML INJECTION SOLUTION: 5000 5,000 unit/mL | INTRAMUSCULAR | Qty: 1

## 2013-12-29 NOTE — Unmapped (Signed)
Please see my note from the date of this note

## 2013-12-29 NOTE — Unmapped (Signed)
Problem: Inadequate Airway Clearance  Goal: Patient will maintain patent airway  Assess and monitor breath sounds, cough and sputum (if present), and intake/output. Collaborate with respiratory therapy to administer medications and treatments.   Outcome: Progressing  Incentive Spirometry ordered to assist in lung expansion and improve oxygenation.

## 2013-12-29 NOTE — Unmapped (Signed)
[place]  Inpatient Discharge Summary     Patient: Andre Olson  Age: 63 y.o.    MRN: [id number]   CSN: [id number]    Date of Admission: 12/28/2013  Date of Discharge: 12/30/2013  Attending Physician: Leia Alf, MD   Primary Care Physician: Marolyn Haller, MD     Diagnoses Present on Admission     Past Medical History   Diagnosis Date   ??? Coronary artery disease    ??? Cancer    ??? Benign prostate hyperplasia    ??? COPD (chronic obstructive pulmonary disease)    ??? Hypothyroid    ??? Myasthenia gravis    ??? Orthostatic hypotension    ??? Hypertension    ??? GERD (gastroesophageal reflux disease)    ??? Depression         Discharge Diagnoses     Active Hospital Problems    Diagnosis Date Noted   ??? Syncope [780.2] 12/28/2013   ??? 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 Myasthenia gravis with exacerbation, adult form [358.01] 12/28/2013      Resolved Hospital Problems    Diagnosis Date Noted Date Resolved   No resolved problems to display.       Operations/Procedures Performed (include dates)     Surgeries: none      Lines and tubes:  Active Line / PIV Line    Name:   Placement date:   Placement time:   Site:   Days:    Peripheral IV Left Forearm        Forearm             Other Procedures / Pertinent Imaging: none      Consulting Services (include reason)          Consult Orders            Start     Ordered    12/28/13 2131  Inpatient consult to Neurology   Once     Provider:  (Not yet assigned)   Question Answer Comment   Reason for Consult? 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                   At C5-C6 and C6-C7 posterior central osteophytes are present         resulting in spinal canal stenosis and mild indentation of  the         ventral aspect of the cord. There is foraminal stenosis with         probable impingement of the exiting bilateral C6 and C7 nerve         roots.                   There is no acute fracture, traumatic subluxation or canal         compromise by fracture.   Provider:  (Not yet assigned)   Question Answer Comment   Reason for Consult? c5/c6 compression on MRI    Treatment previously provided by: [facility]    Consulting Provider Team: Marrion Coy TEAM        12/28/13 2130  Allergies     No Known Allergies    Discharge Medications        Medication List      TAKE these medications, which are NEW      Quantity/Refills    aspirin 81 MG EC tablet   Take 1 tablet (81 mg total) by mouth daily with breakfast.    Quantity:  30 tablet   Refills:  0       ondansetron 4 MG tablet   Commonly known as:  ZOFRAN   Take 1 tablet (4 mg total) by mouth every 6 hours as needed for Nausea.    Quantity:  20 tablet   Refills:  0       ondansetron 4 mg/2 mL Soln injection   Commonly known as:  ZOFRAN   Inject 2 mLs (4 mg total) into the vein every 6 hours as needed.    Refills:  0         TAKE these medication, which have CHANGED      Quantity/Refills    azaTHIOprine 100 mg tablet   Commonly known as:  IMURAN   Take 2 tablets (200 mg total) by mouth At bedtime.   What changed:    - medication strength  - how much to take  - when to take this    Refills:  0         TAKE these medications, which you were ALREADY TAKING      Quantity/Refills    azelastine 137 mcg (0.1 %) nasal spray   Commonly known as:  ASTELIN   Use 2 sprays into each nostril 2 times a day. Use in each nostril as directed    Refills:  0       budesonide-formoterol 80-4.5 mcg/actuation inhaler   Commonly known as:  SYMBICORT   Inhale 2 puffs into the lungs 2 times a day.    Refills:  0       buPROPion SR 150 MG tablet   Commonly known as:  WELLBUTRIN SR   Take 150 mg by mouth 2 times a day.    Refills:  0       cyanocobalamin 1000 MCG tablet   Commonly known as:   VITAMIN B-12   Take 1,000 mcg by mouth daily.    Refills:  0       finasteride 5 mg tablet   Commonly known as:  PROSCAR   Take 5 mg by mouth daily.    Refills:  0       ipratropium 0.03 % nasal spray   Commonly known as:  ATROVENT   Use 2 sprays into each nostril every 12 (twelve) hours.    Refills:  0       levothyroxine 112 MCG tablet   Commonly known as:  SYNTHROID, LEVOTHROID   Take 224 mcg by mouth daily.    Refills:  0       midodrine 10 MG tablet   Commonly known as:  PROAMATINE   Take 10 mg by mouth 3 times a day.    Refills:  0       montelukast 10 mg tablet   Commonly known as:  SINGULAIR   Take 10 mg by mouth at bedtime.    Refills:  0       multivitamin capsule   Take 1 capsule by mouth daily.    Refills:  0       pantoprazole 40 MG tablet   Commonly  known as:  PROTONIX   Take 40 mg by mouth every morning before breakfast.    Refills:  0       pravastatin 40 MG tablet   Commonly known as:  PRAVACHOL   Take 40 mg by mouth at bedtime.    Refills:  0       pyridostigmine 60 mg tablet   Commonly known as:  MESTINON   Take 60 mg by mouth 3 times a day.    Refills:  0       tamsulosin 0.4 mg Cp24   Commonly known as:  FLOMAX   Take 0.4 mg by mouth at bedtime.    Refills:  0       venlafaxine 75 MG tablet   Commonly known as:  EFFEXOR   Take 75 mg by mouth 3 times a day.    Refills:  0         Where to Get Your Medications    Information on where to get these meds is not yet available. Ask your nurse or doctor.        -  aspirin 81 MG EC tablet   -  azaTHIOprine 100 mg tablet   -  ondansetron 4 MG tablet   -  ondansetron 4 mg/2 mL Soln injection                        Discharge Exam     General: Alert, well nourished male in no acute distress.   Pulm: CTAB. Non-labored. No chest wall TTP. No external signs of chest wall trauma.   Cardiac: Regular rate and rhythm with +1/6 systolic murmur over LSB   Abd: Abdomen soft and non-tender. BS+   Skin: Warm, dry. No rashes or lesions.   Extremities: 2+ pulses bilat in  UE/LE.   Neuro: Alert and Oriented x3. Responds and moves all extremities appropriately. Decreased b/l sensation to light touch     Reason for Admission     Haroun Cotham is a 63 y.o. male with PMH of seronegative myasthenia gravis, BPH, HTN, orthostatic hypotension who presents as transfer from [place] for neurosurgical evaluation of foraminal stenosis with probable impingement of the exiting bilateral C6 and C7 nerve roots.    Hospital Course     Active Hospital Problems    Diagnosis Date Noted   ??? Syncope [780.2] 12/28/2013   ??? 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 Myasthenia gravis with exacerbation, adult form [358.01] 12/28/2013      Resolved Hospital Problems    Diagnosis Date Noted Date Resolved   No resolved problems to display.     #Orthostatic Hypotension  Being worked up at [place] with possible autoimmune etiology related to patient's Myasthenia Gravis. We continued his 10 mg Midodrine and would consider Venlafaxine taper to reduce potential complicating factors.    #Foraminal Stenosis  While being worked up for orthostatic hypotension at [facility], MRI/MRA revealed foraminal stenosis with probable impingement of the exiting bilateral C6 and C7 nerve roots. Patient was transferred to [facility] for neurosurgical evaluation who recommended no acute surgical intervention required at this time with activity as tolerated and no brace with plan for f/u with Orthopedic Surgeon Dr. Sandi Mealy after discharge.      #Myasthenia Gravis  Continued Pyridostigmine at 60 mg PO TID and 200 mg Azathioprine daily. Baseline NIF on admission were found to be -40+ and VC 4.3 L.      Condition on  Discharge     1. Functional Status: mildly impaired   Describe limitations, if any: Orthostatic Hypotension; Fall Risk    2. Mental Status: Alert/Oriented   Describe limitations, if any: None    3. Dietary Restrictions / Tube Feeding / TPN  Diet Orders         Diet regular starting at 07/28 2114        As listed above    4. Discharge  specific orders:   None required    5. Core measures followed: (if this is a core measure patient)  Discharge Weight: 164 lb 8 oz (74.617 kg)     Core Measure Documentation  Was the Pneumonia Vaccine Screening Completed?: Yes  Was the pneumococcal vaccine ordered?: No  Was the Influenza Vaccine Screening Completed?: No, April-September Discharge  The core measures checklist is complete for discharge?: Yes    Disposition     [facility]      Follow-Up Appointments     No future appointments.  Cephus Shelling, MD  [address]  [address]  [place] [address]  [phone]    On 01/05/2014        Signed:    Josue Hector, MD  12/30/2013, 2:12 PM

## 2013-12-29 NOTE — Unmapped (Signed)
[place]  DEPARTMENT OF INTERNAL MEDICINE  DAILY PROGRESS NOTE    Chief Complaint / Reason for Follow-Up     Andre Olson is a 63 y.o. male on hospital day 1.  The principal reason for today's follow up visit is Syncope.    Interval History / Subjective     No acute events overnight. Continues to endorse dizziness with standing. Easy fatigue with continued exertion.      Review of Systems     Review of Systems    As above    Medications     Scheduled Meds:  ??? aspirin  81 mg Oral Daily with breakfast   ??? atorvastatin  10 mg Oral Nightly (2100)   ??? azaTHIOprine  200 mg Oral QHS   ??? buPROPion  150 mg Oral BID   ??? cyanocobalamin  1,000 mcg Oral Daily 0900   ??? finasteride  5 mg Oral Daily 0900   ??? heparin (porcine)  5,000 Units Subcutaneous Q8H   ??? levothyroxine  224 mcg Oral DAILY 0600   ??? midodrine  10 mg Oral TID   ??? mometasone-formoterol  2 puff Inhalation RT Q12H   ??? montelukast  10 mg Oral Nightly (2100)   ??? pantoprazole  40 mg Oral DAILY 0600   ??? pyridostigmine  30 mg Oral TID   ??? sodium chloride  10 mL Intravenous QS   ??? tamsulosin  0.4 mg Oral Nightly (2100)   ??? venlafaxine  75 mg Oral TID WC     Continuous Infusions:   PRN Meds:albuterol 2.5 mg in 3 ml NS NEB inhalation, ondansetron, ondansetron    Vital Signs     Temp:  [97.1 ??F (36.2 ??C)-97.5 ??F (36.4 ??C)] 97.1 ??F (36.2 ??C)  Heart Rate:  [60-65] 63  Resp:  [16] 16  BP: (118-140)/(63-75) 140/75 mmHg    Intake/Output Summary (Last 24 hours) at 12/29/13 0754  Last data filed at 12/29/13 0602   Gross per 24 hour   Intake    260 ml   Output   1000 ml   Net   -740 ml       Physical Exam     General: Alert, well nourished male in no acute distress.   Pulm: CTAB. Non-labored. No chest wall TTP. No external signs of chest wall trauma.   Cardiac: Regular rate and rhythm with +1/6 systolic murmur over LSB    Abd: Abdomen soft and non-tender. BS+   Skin: Warm, dry. No rashes or lesions.  Extremities: 2+ pulses bilat in UE/LE.  Neuro: Alert and Oriented x3.  Responds and moves all extremities appropriately. Decreased b/l sensation to light touch unchanged from yesterday.    Laboratory Data         Lab 12/29/13  0451   WBC 6.7   HEMOGLOBIN 15.2   HEMATOCRIT 44.3   MEAN CORPUSCULAR VOLUME 94.1   PLATELETS 177         Lab 12/29/13  0451   SODIUM 135   POTASSIUM 4.0   CHLORIDE 101   CO2 27   BUN 23   CREATININE 1.10   GLUCOSE 86   CALCIUM 9.1   PHOSPHORUS 3.6         Lab 12/29/13  0451   INR 1.0   PROTHROMBIN TIME 12.6         Lab 12/29/13  0451   ALBUMIN 4.1         Specialty labs:    Per RT note:  NIF -40+   VC 4.3 L          Diagnostic Studies     none    Assessment & Plan     Andre Olson is a 63 y.o. male on HD# 1 with Syncope.  The medical issues being addressed in today's encounter are as follows:    Active Hospital Problems    Diagnosis Date Noted   ??? Syncope [780.2] 12/28/2013   ??? 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 Myasthenia gravis with exacerbation, adult form [358.01] 12/28/2013      Resolved Hospital Problems    Diagnosis Date Noted Date Resolved   No resolved problems to display.     1. [facility]ostatic hypotension: [facility]: patient's peripheral neuropathy and orthostatic hypotension may have an autoimmune etiology related to his MG.   -Neurology curbsided; will see if stays inpatient at [facility]. Otherwise, recommend transfer back to [place] to Medicine for completion of work up for orthostatic hypotension  -10 mg Midrodrine daily   -Weaning Venlafaxine over next 2 weeks    2. Syncope: Given association of position changes and hypotension, syncope most likely 2/2 orthostatic hypotension   -Consider Echo to r/o cardiogenic syncope   -f/u OSH records for tilt-table testing, carotid US     3. Cervical Stenosis: Endorses symmetric parasthesias in BUE with OSH MRI positive for cervical stenosis   -Neurosurgery consulted; Orthopedic covering for spine today; Appreciate recs --> if not surgical candidate, transfer back to [place] to complete w/u for  orthostatic hypotension/MG  -Monitor for focal neurologic deficit and vascular compromise     4. Myasthenia Gravis: diplopia, hypophonia that increases with sustained effort   -Appreciate Neurology Recs   -30 mg Pyridostigmine PO TID   -200 mg Azathioprine daily   -Zofran TID PRN   -obtain baseline Vital Capacity and Maximal Inspiratory Pressure   -Monitor for Myasthenic Crisis --> Dyspnea while supine, severe dysphagia, increased WOB ==> low threshold for intubation     5. Peripheral Neuropathy   -w/u ongoing at [place]; f/u on TSH, SPEP, ANA, ESR, RPR, B12     6. Chronic Health problems  - Continue Synthroid   -Continue Wellbutrin   -Continue Statin   -Consider ASA   -Continue Protonix   -Continue Tamulosin, Finasteride   -Continue dulera, montelukast, +albuterol prn Q4    -Continue B12 dose     7. PPX   -SubQ Heparin, Fall Precautions      Nutrition:   Diet Orders         Diet regular starting at 07/28 2114          Code Status: Full Code    Signed:  Maryclare Bean, MS  12/29/2013, 7:54 AM

## 2013-12-29 NOTE — Unmapped (Signed)
I have reviewed the resident's discharge summary and agree with the documented hospital course.    Salome Arnt , MD  Internal Medicine Attending  12/30/2013 5:13 PM

## 2013-12-29 NOTE — Unmapped (Signed)
Problem: Inadequate Gas Exchange  Goal: Patient is adequately oxygenated and ventilation is improved  Assess and monitor vital signs, oxygen saturation, respiratory status to include rate, depth, effort, and lung sounds, mental status, cyanosis, and labs (ABG???s). Monitor effects of medications that may sedate the patient. Collaborate with respiratory therapy to administer medications and treatments.  Outcome: Progressing  Aerosolized medications given to treat airway disease and improve gas exchange.

## 2013-12-29 NOTE — Unmapped (Signed)
Problem: Fall Prevention  Goal: Patient will remain free of falls  Assess and monitor vitals signs, neurological status including level of consciousness and orientation. Reassess fall risk per hospital policy.    Ensure arm band on, uncluttered walking paths in room, adequate room lighting, call light and overbed table within reach, bed in low position, wheels locked, side rails up per policy, and non-skid footwear provided.   Outcome: Progressing  Patient is free from falls. Patient's vs stable, A/O. Patient's room is free from clutter and non-skid footwear is in place. Patient is resting in a low, locked bed with call light and bedside table within reach.

## 2013-12-29 NOTE — Unmapped (Signed)
Pt performed VC and NIF.  He performed each three times with no complications.  He tolerated the procedure well.    NIF  -40+  VC  4.3 L

## 2013-12-29 NOTE — Unmapped (Deleted)
[place]  DEPARTMENT OF INTERNAL MEDICINE  DAILY PROGRESS NOTE    Chief Complaint / Reason for Follow-Up     Andre Olson is a 63 y.o. male on hospital day 1.  The principal reason for today's follow up visit is Syncope.    Interval History / Subjective     No acute events overnight. Continues to endorse dizziness with standing. Easy fatigue with continued exertion.      Review of Systems     Review of Systems    As above    Medications     Scheduled Meds:  ??? aspirin  81 mg Oral Daily with breakfast   ??? atorvastatin  10 mg Oral Nightly (2100)   ??? azaTHIOprine  200 mg Oral QHS   ??? buPROPion  150 mg Oral BID   ??? cyanocobalamin  1,000 mcg Oral Daily 0900   ??? finasteride  5 mg Oral Daily 0900   ??? heparin (porcine)  5,000 Units Subcutaneous Q8H   ??? levothyroxine  224 mcg Oral DAILY 0600   ??? midodrine  10 mg Oral TID   ??? mometasone-formoterol  2 puff Inhalation RT Q12H   ??? montelukast  10 mg Oral Nightly (2100)   ??? pantoprazole  40 mg Oral DAILY 0600   ??? pyridostigmine  30 mg Oral TID   ??? sodium chloride  10 mL Intravenous QS   ??? tamsulosin  0.4 mg Oral Nightly (2100)   ??? venlafaxine  37.5 mg Oral TID WC    Followed by   ??? [START ON 01/03/2014] venlafaxine  25 mg Oral TID WC    Followed by   ??? [START ON 01/08/2014] venlafaxine  25 mg Oral BID WC     Continuous Infusions:   PRN Meds:albuterol 2.5 mg in 3 ml NS NEB inhalation, ondansetron, ondansetron    Vital Signs     Temp:  [97.1 ??F (36.2 ??C)-97.6 ??F (36.4 ??C)] 97.6 ??F (36.4 ??C)  Heart Rate:  [58-66] 66  Resp:  [16-18] 16  BP: (118-140)/(63-75) 123/74 mmHg    Intake/Output Summary (Last 24 hours) at 12/29/13 1342  Last data filed at 12/29/13 1133   Gross per 24 hour   Intake    620 ml   Output   1500 ml   Net   -880 ml       Physical Exam     General: Alert, well nourished male in no acute distress.   Pulm: CTAB. Non-labored. No chest wall TTP. No external signs of chest wall trauma.   Cardiac: Regular rate and rhythm with +1/6 systolic murmur over LSB    Abd:  Abdomen soft and non-tender. BS+   Skin: Warm, dry. No rashes or lesions.  Extremities: 2+ pulses bilat in UE/LE.  Neuro: Alert and Oriented x3. Responds and moves all extremities appropriately. Decreased b/l sensation to light touch unchanged from yesterday.    Laboratory Data         Lab 12/29/13  0451   WBC 6.7   HEMOGLOBIN 15.2   HEMATOCRIT 44.3   MEAN CORPUSCULAR VOLUME 94.1   PLATELETS 177         Lab 12/29/13  0451   SODIUM 135   POTASSIUM 4.0   CHLORIDE 101   CO2 27   BUN 23   CREATININE 1.10   GLUCOSE 86   CALCIUM 9.1   PHOSPHORUS 3.6         Lab 12/29/13  0451   INR 1.0  PROTHROMBIN TIME 12.6         Lab 12/29/13  0451   ALBUMIN 4.1         Specialty labs:    Per RT note:     NIF -40+   VC 4.3 L          Diagnostic Studies     none    Assessment & Plan     Andre Olson is a 63 y.o. male on HD# 1 with Syncope.  The medical issues being addressed in today's encounter are as follows:    Active Hospital Problems    Diagnosis Date Noted   ??? Syncope [780.2] 12/28/2013   ??? 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 Myasthenia gravis with exacerbation, adult form [358.01] 12/28/2013      Resolved Hospital Problems    Diagnosis Date Noted Date Resolved   No resolved problems to display.     1. Orthostatic hypotension: [facility]: patient's peripheral neuropathy and orthostatic hypotension may have an autoimmune etiology related to his MG.   -Neurology curbsided; will see if stays inpatient at [facility] Otherwise, recommend transfer back to [place] to Medicine for completion of work up for orthostatic hypotension  -10 mg Midrodrine daily   -Weaning Venlafaxine over next 2 weeks    2. Syncope: Given association of position changes and hypotension, syncope most likely 2/2 orthostatic hypotension   -Consider Echo to r/o cardiogenic syncope   -f/u OSH records for tilt-table testing, carotid US     3. Cervical Stenosis: Endorses symmetric parasthesias in BUE with OSH MRI positive for cervical stenosis      -Neurosurgery consulted; Orthopedic covering for spine today; Appreciate recs --> if not surgical candidate, transfer back to [place] to complete w/u for orthostatic hypotension/MG  -Monitor for focal neurologic deficit and vascular compromise     4. Myasthenia Gravis: diplopia, hypophonia that increases with sustained effort   -Appreciate Neurology Recs   -30 mg Pyridostigmine PO TID   -200 mg Azathioprine daily   -Zofran TID PRN   -obtain baseline Vital Capacity and Maximal Inspiratory Pressure   -Monitor for Myasthenic Crisis --> Dyspnea while supine, severe dysphagia, increased WOB ==> low threshold for intubation     5. Peripheral Neuropathy   -w/u ongoing at [place]; f/u on TSH, SPEP, ANA, ESR, RPR, B12     6. Chronic Health problems  - Continue Synthroid   -Continue Wellbutrin   -Continue Statin   -Consider ASA   -Continue Protonix   -Continue Tamulosin, Finasteride   -Continue dulera, montelukast, +albuterol prn Q4    -Continue B12 dose     7. PPX   -SubQ Heparin, Fall Precautions      Nutrition:   Diet Orders         Diet regular starting at 07/28 2114          Code Status: Full Code    Signed:  Maryclare Bean, MS  12/29/2013, 1:42 PM

## 2013-12-29 NOTE — Unmapped (Signed)
FVC=3.9  NIF= >-40

## 2013-12-29 NOTE — Unmapped (Signed)
Problem: Pain  Goal: Patient???s pain is progressing toward patient???s stated pain goal  Assess and monitor patient???s pain using appropriate pain scale. Collaborate with interdisciplinary team and initiate plan and interventions as ordered. Re-assess patient???s pain level 30 - 60 minutes after pain management intervention.   Outcome: Progressing  Pt verbalizes understanding of 0-10 pain scale, no complaints of pain currently, will continue to monitor for pain

## 2013-12-29 NOTE — Unmapped (Signed)
ORTHOPAEDIC SPINE CONSULT H&P    ASSESSMENT:  Andre Olson is an 63 y.o. male with a PMHx of seronegative myasthenia gravis, BPH, HTN, orthostatic hypotension who presents as a transfer from [facility] with concern for cervical cord compression.     PLAN:  - no acute surgical intervention required at this time  -Activity as tolerated, no brace needed  - Patient imaging and case was discussed and reviewed with Dr. Sandi Mealy  - F/u with Dr. Sandi Mealy one week after discharge.    Danton Sewer, MD  PGY-1 Orthopedic Surgery  12/29/2013      ________________________________________________________________________      12/29/2013 2:05 PM  Requesting Service: Medicine  Ortho Attending: Dr. Launa Grill Andre Olson is an 63 y.o.  male.    HPI:  Transfer: 12/28/2013  Antibiotics: n/a    Andre Olson is an 63 y.o.  male with a PMHx of seronegative myasthenia gravis, BPH, HTN, orthostatic hypotension who presents as a transfer from [facility] with concern for cervical cord compression.     The patient has had several syncopal episodes since March 2015 and was has been taking midodrine for orthostatic hypotension. Was recently started on fluorinef. He has been taking his blood pressure at home and noted a recent reading of 240s/140s. Presented to [facility] with concern for this hypertension. They did not want to admit him and he was admitted to instead to [facility].     Patient reports having had 10 years of neck pain which has worsened over the last year. The pain is sharp, 4/10, and radiates down left and right arms as a tingling sensation in left and right arms and hands, both posteriorly and anteriorly. Says if he sleeps with head turned to left or right, he awakes with upper extremity numbness. Also complains of pain down right leg to feet that occurs daily.     During working at [place], an MRI/MRA was performed and revealed likely impingement of C6 and C7 nerve roots, bilateraly. The patient does note that  he was diagnosed with cervical canal stenosis in the 1990s but was not found to warrant surgery at that time. Also states history of right arm numbness/tingling status post ulnar nerve release in 2007 in [place], [place].     Patient does have myasthenia gravis, and reports fatigability of muscles, eye lids after exertion. Complains of dropping objects after holding in right hand for extended period of time. Patient is left handed.       Past Med/Surg/Family History  Past Medical History   Diagnosis Date   ??? Coronary artery disease    ??? Cancer    ??? Benign prostate hyperplasia    ??? COPD (chronic obstructive pulmonary disease)    ??? Hypothyroid    ??? Myasthenia gravis    ??? Orthostatic hypotension    ??? Hypertension    ??? GERD (gastroesophageal reflux disease)    ??? Depression      Past Surgical History   Procedure Laterality Date   ??? Total thyroidectomy  March 1995   ??? Ulnar nerve release       No family history on file.    Past Social History  History   Substance Use Topics   ??? Smoking status: Former Smoker -- 1.00 packs/day for 36 years     Types: Cigarettes     Quit date: 02/02/2000   ??? Smokeless tobacco: Not on file   ??? Alcohol Use: 7.2 -  8.4 oz/week     12-14 Cans of beer per week     No Known Allergies    Medications:  Prescriptions prior to admission   Medication Sig Dispense Refill   ??? azaTHIOprine (IMURAN) 50 mg tablet Take 150 mg by mouth at bedtime.       ??? azelastine (ASTELIN) 137 mcg (0.1 %) nasal spray Use 2 sprays into each nostril 2 times a day. Use in each nostril as directed       ??? budesonide-formoterol (SYMBICORT) 80-4.5 mcg/actuation inhaler Inhale 2 puffs into the lungs 2 times a day.       ??? buPROPion SR (WELLBUTRIN SR) 150 MG tablet Take 150 mg by mouth 2 times a day.       ??? cyanocobalamin (VITAMIN B-12) 1000 MCG tablet Take 1,000 mcg by mouth daily.       ??? finasteride (PROSCAR) 5 mg tablet Take 5 mg by mouth daily.       ??? ipratropium (ATROVENT) 0.03 % nasal spray Use 2 sprays into each nostril every  12 (twelve) hours.       ??? levothyroxine (SYNTHROID, LEVOTHROID) 112 MCG tablet Take 224 mcg by mouth daily.       ??? midodrine (PROAMATINE) 10 MG tablet Take 10 mg by mouth 3 times a day.       ??? montelukast (SINGULAIR) 10 mg tablet Take 10 mg by mouth at bedtime.       ??? multivitamin capsule Take 1 capsule by mouth daily.       ??? pantoprazole (PROTONIX) 40 MG tablet Take 40 mg by mouth every morning before breakfast.       ??? pravastatin (PRAVACHOL) 40 MG tablet Take 40 mg by mouth at bedtime.       ??? pyridostigmine (MESTINON) 60 mg tablet Take 60 mg by mouth 3 times a day.       ??? tamsulosin (FLOMAX) 0.4 mg Cp24 Take 0.4 mg by mouth at bedtime.       ??? venlafaxine (EFFEXOR) 75 MG tablet Take 75 mg by mouth 3 times a day.         Current Facility-Administered Medications   Medication Dose Route Frequency Provider Last Rate Last Dose   ??? albuterol 2.5 mg in 3 ml NS NEB inhalation   Nebulization RT Q4H PRN Barrie Folk Ditty, MD       ??? aspirin EC tablet 81 mg  81 mg Oral Daily with breakfast Barrie Folk Ditty, MD   81 mg at 12/29/13 0810   ??? atorvastatin (LIPITOR) tablet 10 mg  10 mg Oral Nightly (2100) Barrie Folk Ditty, MD   10 mg at 12/28/13 2240   ??? azaTHIOprine (IMURAN) tablet 200 mg  200 mg Oral QHS Barrie Folk Ditty, MD   200 mg at 12/28/13 2240   ??? buPROPion (WELLBUTRIN) tablet 150 mg  150 mg Oral BID Barrie Folk Ditty, MD   150 mg at 12/29/13 0809   ??? cyanocobalamin (VITAMIN B-12) tablet 1,000 mcg  1,000 mcg Oral Daily 0900 Barrie Folk Ditty, MD   1,000 mcg at 12/29/13 0809   ??? finasteride (PROSCAR) tablet 5 mg  5 mg Oral Daily 0900 Barrie Folk Ditty, MD   5 mg at 12/29/13 0809   ??? heparin (porcine) injection 5,000 Units  5,000 Units Subcutaneous Q8H Josue Hector, MD   5,000 Units at 12/29/13 0546   ??? levothyroxine (SYNTHROID, LEVOTHROID) tablet 224 mcg  224 mcg Oral DAILY 0600 Barrie Folk  Ditty, MD   224 mcg at 12/29/13 0545   ??? midodrine (PROAMATINE) tablet 10 mg  10 mg Oral TID Barrie Folk Ditty, MD   10 mg at 12/29/13  1244   ??? mometasone-formoterol (DULERA HFA) 100-5 mcg/actuation inhaler 2 puff  2 puff Inhalation RT Q12H Barrie Folk Ditty, MD   2 puff at 12/29/13 0930   ??? montelukast (SINGULAIR) tablet 10 mg  10 mg Oral Nightly (2100) Barrie Folk Ditty, MD   10 mg at 12/28/13 2240   ??? ondansetron (ZOFRAN) 4 mg/2 mL injection 4 mg  4 mg Intravenous Q6H PRN Barrie Folk Ditty, MD        Or   ??? ondansetron (ZOFRAN) tablet 4 mg  4 mg Oral Q6H PRN Barrie Folk Ditty, MD       ??? pantoprazole (PROTONIX) EC tablet 40 mg  40 mg Oral DAILY 0600 Barrie Folk Ditty, MD   40 mg at 12/29/13 0546   ??? pyridostigmine (MESTINON) tablet 30 mg  30 mg Oral TID Barrie Folk Ditty, MD   30 mg at 12/29/13 1244   ??? sodium chloride 0.9 % flush 10 mL  10 mL Intravenous QS Barrie Folk Ditty, MD   10 mL at 12/29/13 1245   ??? tamsulosin (FLOMAX) capsule Cp24 0.4 mg  0.4 mg Oral Nightly (2100) Barrie Folk Ditty, MD   0.4 mg at 12/28/13 2240   ??? venlafaxine (EFFEXOR) tablet 37.5 mg  37.5 mg Oral TID WC Leia Alf, MD   37.5 mg at 12/29/13 1244    Followed by   ??? [START ON 01/03/2014] venlafaxine (EFFEXOR) tablet 25 mg  25 mg Oral TID WC Leia Alf, MD        Followed by   ??? [START ON 01/08/2014] venlafaxine (EFFEXOR) tablet 25 mg  25 mg Oral BID WC Leia Alf, MD           ROS:  Negative except for HPI above    PHYSICAL EXAM:  Patient Vitals for the past 24 hrs:   BP Temp Temp src Pulse Resp SpO2 Height Weight   12/29/13 1133 123/74 mmHg 97.6 ??F (36.4 ??C) Oral 66 16 100 % - -   12/29/13 0930 - - - 63 16 98 % - -   12/29/13 0815 134/71 mmHg 97.5 ??F (36.4 ??C) Oral 58 18 97 % - -   12/29/13 0602 - - - - - - - 165 lb 6.4 oz (75.025 kg)   12/29/13 0500 140/75 mmHg 97.1 ??F (36.2 ??C) Oral 63 16 96 % - -   12/29/13 0100 118/63 mmHg 97.3 ??F (36.3 ??C) Oral 60 16 97 % - -   12/28/13 2053 - - - - - - 6' 2 (1.88 m) 175 lb (79.379 kg)   12/28/13 1953 127/73 mmHg 97.5 ??F (36.4 ??C) Oral 65 16 95 % - -     Pain Score: Pain Score:   4    General: NAD  HEENT: normocephalic,  atraumatic. PERRLA.    CV/P: unlabored breathing  ABD: belly soft, non-tender  MSK:  Spine:  no point tenderness along cervical, thoracic, lumbar spine,   LUE:  no obvious deformity or osseous TTP; gross SILT in radial/median/ulnar nerve distributions; finger cross, A-OK, thumbs up; radial pulse 2+  RUE:  no obvious deformity or osseous TTP; gross SILT in radial/median/ulnar nerve distributions; finger cross, A-OK, thumbs up; radial pulse 2+  LLE:  no obvious deformity or osseous TTP; gross SILT in sural/saphenous/tibial/deep peroneal/superficial peroneal  nerve distributions; patient reports subjective decrease in sensation to light touch along tibial nerve distribution versus right leg; DP and PT pulses 2+  RLE:  no obvious deformity or osseous TTP; gross SILT in sural/saphenous/tibial/deep peroneal/superficial peroneal nerve distributions; DP and PT pulses 2+    Upper Extremity Motor Exam   R L  D 5 5  B 5 5  T 5 5  WF 5 5  WE 5 5  Grip 5 5  IO 5 5    Lower Extremity Motor Exam   R L  HF 5 5  HE 5 5  Q 5 5  (right marginally weaker than left)  H 5 5 (right marginally weaker than left)  TA 5 5  GS 5 5  EHL 5 5       Procedures Performed:   none    LABS:  Lab Results   Component Value Date    WBC 6.7 12/29/2013    HGB 15.2 12/29/2013    HCT 44.3 12/29/2013    MCV 94.1 12/29/2013    PLT 177 12/29/2013     Lab Results   Component Value Date    GLUCOSE 86 12/29/2013    BUN 23 12/29/2013    CO2 27 12/29/2013    CREATININE 1.10 12/29/2013    K 4.0 12/29/2013    NA 135 12/29/2013    CL 101 12/29/2013    CALCIUM 9.1 12/29/2013     Lab Results   Component Value Date    INR 1.0 12/29/2013     No results found for this basename: SEDRATE     No results found for this basename: CRP       IMAGING:  [facility]   [facility]/13 MRA HEAD w/o Findings:   Brain MRI:   No acute territorial or brainstem infarcts are present. No focal areas of diffusion restriction are present. There is age-appropriate mild periventricular and subcortical  T2 FLAIR hyperintensities consistent with chronic small vessel ischemic disease. No significant abnormal white matter signal abnormalities are present. There is no evidence of cytotoxic or vasogenic edema. No acute intracerebral hemorrhage, mass effect or extra-axial fluid collection is present.   The ventricles and cisterns are normal in size, shape, and configuration. The corpus callosum, pituitary and the optic   chiasm appear normal. The sulci and gyri are unremarkable. The cerebellar tonsils are normal in position. The anterior and posterior circulation arteries show expected normal flow voids. The dural venous sinuses are patent. The orbital tissue planes are normal. The paranasal sinuses and the mastoid air cells are clear.   Brain MRA:   There is no ICA stenosis at the skull base. Both petrous and cavernous ICAs are normally patent. Both carotid termini and   paraclinoid ICAs are normally patent. Both A1 and A2 ACAs are patent. The left A1 ACA is dominant. The anterior communicating artery is unremarkable. Both M1 and M2 MCAs are normally patent. There is no MCA bifurcation aneurysm. The visualized posterior communicating arteries are unremarkable. The right vertebral artery is moderately dominant. The left vertebral artery is diminutive. Both vertebral arteries join to form a normally patent basilar artery. There is no basilar apex aneurysm. Both posterior cerebral arteries are normally patent. There is no PICA aneurysm.   Neck MRA:   There is normal 3 vessel aortic arch. The great vessels arising from the arch shows no ostial stenosis. No evidence of subclavian artery stenosis. The bilateral common carotid arteries and both cervical internal  carotid arteries are normally patent. There is no stenosis or occlusion. The right vertebral artery is moderately dominant. The left vertebral artery is markedly diminutive. Both vertebral arteries show normal antegrade flow. There is no flow-limiting vertebral or  carotid artery dissection.   Impression:   There is no acute intracerebral hemorrhage, enhancing mass lesion or acute territorial infarct.   The anterior and posterior cerebral circulation arteries show no major branch occlusion.   The bilateral cervical internal carotid arteries show no hemodynamically significant focal diameter stenosis.   There is no flow-limiting vertebral or carotid artery dissection   12/27/13 XRAY CSPINE:Impression:   1. Anatomical alignment. Normal craniovertebral junction. No acute fracture or traumatic subluxation.   2. 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   3. Upon flexion there is minimal 1 mm anterolisthesis of C2 on C3 likely on degenerative basis.   07/27/15MRI NECK:Findings: The alignment is anatomic. There is straightening of the cervical spine. The craniovertebral junction is normal. There is no tonsillar herniation. The craniovertebral junction is normal. There is diffuse disc desiccation. Disc space narrowing is present at C5-C6 and C6-C7. There is no prevertebral soft tissue swelling. There is no acute fracture, traumatic listhesis or canal compromise by fracture.   C2-C3: There is mild right and moderate left foraminal stenosis due to uncovertebral DJD.   C3-C4: There is focal posterocentral disc protrusion compressing the thecal sac and mildly indenting the ventral aspect of the cord. There is no significant foraminal stenosis.   C4-C5: There is mild broad-based annular bulge. There is minor foraminal encroachment due to uncovertebral DJD. There is minimal 1 mm anterolisthesis of C4 in relation to C5.   C5-C6: There is posterior central osteophyte compressing the thecal sac and indenting the ventral aspect of the cord. There is severe bilateral foraminal stenosis with probable impingement of the exiting C6 nerve roots on both sides. There is minimal 1 mm retrolisthesis of C5 in relation to C6 on degenerative basis.   C6-C7:  There is posterior central osteophyte compressing the thecal sac and mildly indenting the ventral aspect of the cord.   There is no canal stenosis. There is severe bilateral foraminal stenosis due to uncovertebral DJD with possible impingement of the exiting C7 nerve roots. There is 2 mm retrolisthesis of C6 in relation to C7 on a degenerative basis.   C7-T1: Minor degenerative changes The cervical cord shows heterogenous intramedullary T2 signal. There is however no focal cord edema, syrinx cavitation or cord enlargement.   Impression:   Moderate multilevel spondyloarthrosis, most significant at C3-C4, C5-C6 and C6-C7 as described.   At C5-C6 and C6-C7 posterior central osteophytes are present resulting in spinal canal stenosis and mild indentation of the ventral aspect of the cord. There is foraminal stenosis with probable impingement of the exiting bilateral C6 and C7 nerve   roots.   There is no acute fracture, traumatic subluxation or canal   compromise by fracture.       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

## 2013-12-30 LAB — CBC
Hematocrit: 45.2 % (ref 38.5–50.0)
Hemoglobin: 15.7 g/dL (ref 13.2–17.1)
MCH: 32.7 pg (ref 27.0–33.0)
MCHC: 34.8 g/dL (ref 32.0–36.0)
MCV: 93.9 fL (ref 80.0–100.0)
MPV: 8.1 fL (ref 7.5–11.5)
Platelets: 179 10E3/uL (ref 140–400)
RBC: 4.81 10E6/uL (ref 4.20–5.80)
RDW: 12.6 % (ref 11.0–15.0)
WBC: 7.2 10E3/uL (ref 3.8–10.8)

## 2013-12-30 LAB — RENAL FUNCTION PANEL W/EGFR
Albumin: 4.2 g/dL (ref 3.5–5.7)
Anion Gap: 8 mmol/L (ref 3–16)
BUN: 19 mg/dL (ref 7–25)
CO2: 26 mmol/L (ref 21–33)
Calcium: 9.3 mg/dL (ref 8.6–10.3)
Chloride: 104 mmol/L (ref 98–110)
Creatinine: 1.1 mg/dL (ref 0.60–1.30)
GFR MDRD Af Amer: 81 See note.
GFR MDRD Non Af Amer: 67 See note.
Glucose: 92 mg/dL (ref 70–100)
Osmolality, Calculated: 288 mosm/kg (ref 278–305)
Phosphorus: 3.9 mg/dL (ref 2.1–4.5)
Potassium: 4.2 mmol/L (ref 3.5–5.3)
Sodium: 138 mmol/L (ref 133–146)

## 2013-12-30 MED ORDER — ondansetron (ZOFRAN) 4 MG tablet
4 | ORAL_TABLET | Freq: Four times a day (QID) | ORAL | Status: AC | PRN
Start: 2013-12-30 — End: ?

## 2013-12-30 MED ORDER — aspirin 81 MG EC tablet
81 | ORAL_TABLET | Freq: Every day | ORAL | 1.00 refills | 30.00000 days | Status: AC
Start: 2013-12-30 — End: 2016-12-17

## 2013-12-30 MED ORDER — ondansetron (ZOFRAN) 4 mg/2 mL Soln injection
4 | Freq: Four times a day (QID) | INTRAMUSCULAR | Status: AC | PRN
Start: 2013-12-30 — End: ?

## 2013-12-30 MED ORDER — azaTHIOprine (IMURAN) 100 mg tablet
100 | Freq: Every evening | ORAL | Status: AC
Start: 2013-12-30 — End: ?

## 2013-12-30 MED FILL — ASPIRIN 81 MG TABLET,DELAYED RELEASE: 81 81 MG | ORAL | Qty: 1

## 2013-12-30 MED FILL — PANTOPRAZOLE 40 MG TABLET,DELAYED RELEASE: 40 40 MG | ORAL | Qty: 1

## 2013-12-30 MED FILL — LEVOTHYROXINE 112 MCG TABLET: 112 112 MCG | ORAL | Qty: 2

## 2013-12-30 NOTE — Unmapped (Signed)
[place]    Social Worker Discharge Summary     Patient name: Andre Olson                                        Patient MRN: [id number]  DOB: [birth year]                              Age: 63 y.o.              Gender: male  Patient emergency contact: Extended Emergency Contact Information  Primary Emergency Contact: Dietrich Pates [place]  Home Phone: [phone]  Relation: Significant other      Attending provider: Salome Arnt, MD  Primary care physician: Marolyn Haller, MD    The MD has indicated that the patient is ready for discharge.  Sou Nohr was referred and accepted at Facility Name: [facility]   Number to Call Report: [phone] Ask for Obed.  The patient will be transported by Name of Transport Service: [facility], molecular Number: [phone] at ETA of Transport: 4:00 pm    Transfer Mode/Level of Care: Basic Life Support Stretcher (BLS)    PASARR/HENS 7000 Completed: N/A    DC Summary and COC have been faxed to facility [phone]. A packet with progress notes was sent with the patient.     The plan has been reviewed:     Patient/Family Informed of Discharge Plan: Yes    Plan Reviewed With Patient, Family, or Significant Other: Yes    Patient and or family are aware and in agreement with the discharge plan: Yes    Family Member Name and Relationship Notified at Discharge: The patient is in charge of his own care        Plan reviewed with MD and other members of the health care team: Yes  Care Plan Completed: Yes    No further SW needs anticipated    Shaune Pollack, MSW, [place]  [phone]

## 2013-12-30 NOTE — Unmapped (Signed)
Ortho Nurse Clinician Consult Note:     Chart Reviewed.    Diagnosis/Activity/WBS: Pt is s/p C6-C7 foraminal stenosis. OOBAT without brace.     Wound Care: n/a    Ortho plan of care: Follow up with Dr. Sandi Mealy in 1 week.     DVT prophylaxis/Anticoagulation: per primary team. Encourage ambulation and SCDs.    Follow up has been scheduled with Dr. Sandi Mealy 8/5 at 0800. Information in dc navigator. Please call with questions or concerns.     Ortho Charge: [phone]

## 2013-12-30 NOTE — Unmapped (Signed)
[place]  DEPARTMENT OF INTERNAL MEDICINE  DAILY PROGRESS NOTE    Chief Complaint / Reason for Follow-Up     Andre Olson is a 63 y.o. male on hospital day 2.  The principal reason for today's follow up visit is Syncope.    Interval History / Subjective     No acute events overnight. Slept well overnight. Continues to endorse dizziness with extended standing. No changes in numbness/tingling.      Review of Systems     Review of Systems    As above.      Medications     Scheduled Meds:  ??? aspirin  81 mg Oral Daily with breakfast   ??? atorvastatin  10 mg Oral Nightly (2100)   ??? azaTHIOprine  200 mg Oral QHS   ??? buPROPion  150 mg Oral BID   ??? cyanocobalamin  1,000 mcg Oral Daily 0900   ??? finasteride  5 mg Oral Daily 0900   ??? heparin (porcine)  5,000 Units Subcutaneous Q8H   ??? levothyroxine  224 mcg Oral DAILY 0600   ??? midodrine  10 mg Oral TID   ??? mometasone-formoterol  2 puff Inhalation RT Q12H   ??? montelukast  10 mg Oral Nightly (2100)   ??? pantoprazole  40 mg Oral DAILY 0600   ??? pyridostigmine  30 mg Oral TID   ??? sodium chloride  10 mL Intravenous QS   ??? tamsulosin  0.4 mg Oral Nightly (2100)   ??? venlafaxine  37.5 mg Oral TID WC    Followed by   ??? [START ON 01/03/2014] venlafaxine  25 mg Oral TID WC    Followed by   ??? [START ON 01/08/2014] venlafaxine  25 mg Oral BID WC     Continuous Infusions:   PRN Meds:acetaminophen, albuterol 2.5 mg in 3 ml NS NEB inhalation, ondansetron, ondansetron    Vital Signs     Temp:  [97.2 ??F (36.2 ??C)-97.8 ??F (36.6 ??C)] 97.8 ??F (36.6 ??C)  Heart Rate:  [59-119] 60  Resp:  [16] 16  BP: (107-138)/(68-82) 131/73 mmHg    Intake/Output Summary (Last 24 hours) at 12/30/13 1141  Last data filed at 12/30/13 1129   Gross per 24 hour   Intake    730 ml   Output   1400 ml   Net   -670 ml       Physical Exam     General: Alert, well nourished male in no acute distress.   Pulm: CTAB. Non-labored.a.   Cardiac: Regular rate and rhythm with +1/6 systolic murmur over LSB    Abd: Abdomen soft and  non-tender. BS+   Skin: Warm, dry. No rashes or lesions.  Extremities: 2+ pulses bilat in UE/LE.  Neuro: Alert and Oriented x3. Responds and moves all extremities appropriately. Decreased b/l sensation to light touch that is stable    Laboratory Data         Lab 12/30/13  0522 12/29/13  0451   WBC 7.2 6.7   HEMOGLOBIN 15.7 15.2   HEMATOCRIT 45.2 44.3   MEAN CORPUSCULAR VOLUME 93.9 94.1   PLATELETS 179 177         Lab 12/30/13  0522 12/29/13  0451   SODIUM 138 135   POTASSIUM 4.2 4.0   CHLORIDE 104 101   CO2 26 27   BUN 19 23   CREATININE 1.10 1.10   GLUCOSE 92 86   CALCIUM 9.3 9.1   PHOSPHORUS 3.9 3.6  Lab 12/29/13  0451   INR 1.0   PROTHROMBIN TIME 12.6         Lab 12/30/13  0522 12/29/13  0451   ALBUMIN 4.2 4.1         Specialty labs:    Per RT note:     VC= 3.2L  NIF= -45cwp       Supine: 118/70 with HR 73  Sitting: 110/74 with HR of 79  Standing: 81/63 with HR of 163    Diagnostic Studies     none    Assessment & Plan     Andre Olson is a 63 y.o. male on HD# 2 with Syncope.  The medical issues being addressed in today's encounter are as follows:    Active Hospital Problems    Diagnosis Date Noted   ??? Syncope [780.2] 12/28/2013   ??? 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 Myasthenia gravis with exacerbation, adult form [358.01] 12/28/2013      Resolved Hospital Problems    Diagnosis Date Noted Date Resolved   No resolved problems to display.     1. [facility]ostatic hypotension: [facility]: patient's peripheral neuropathy and orthostatic hypotension may have an autoimmune etiology related to his MG; Neurology curbsided; Stable  -10 mg Midrodrine daily   -Would consider Venlafaxine Taper as possible confounding factor    2. Cervical Stenosis: Endorses symmetric parasthesias in BUE with OSH MRI positive for cervical stenosis; Stable  -Ortho covering for spine --> recommends no acute surgical intervention, with AAT, no brace and f/u OP in 1 week after discharge    3. Myasthenia Gravis: diplopia,  hypophonia that increases with sustained effort; Stable  -Continue 60 mg Pyridostigmine PO TID and 200 mg Azathioprine daily   -Monitor for Myasthenic Crisis --> Dyspnea while supine, severe dysphagia, increased WOB ==> low threshold for intubation      4. PPX   -SubQ Heparin, Fall Precautions    5. Dispo  -Will transfer back to [facility] to complete w/u for orthostatic hypotension    Nutrition:   Diet Orders         Diet regular starting at 07/28 2114          Code Status: Full Code    Signed:  Maryclare Bean, MS  12/30/2013, 11:41 AM

## 2013-12-30 NOTE — Unmapped (Signed)
ATTENDING PHYSICIAN DAILY PROGRESS NOTE     Subjective:      Patient ID: Andre Olson is a 64 y.o. male.  Hospital day: 2    Chief complaint: Syncope    Interval History:The patient reports feeling fairly well.  Still with + orthostasis.   Seen by orthopedics with no plan to intervene surgically or need for brace.      Objective:      Vital signs in last 24 hours:  Temp:  [97.2 ??F (36.2 ??C)-97.8 ??F (36.6 ??C)] 97.8 ??F (36.6 ??C)  Heart Rate:  [59-119] 60  Resp:  [16] 16  BP: (107-131)/(68-78) 131/73 mmHg    Intake/Output Summary (Last 24 hours) at 12/30/13 1709  Last data filed at 12/30/13 1404   Gross per 24 hour   Intake   1210 ml   Output    800 ml   Net    410 ml     GEN: alert, no acute distress, well developed, well nourished   HEENT: NC/ AT, MMM   CHEST: CTAB, no wheezes, rhonchi,  HEART: RR, , no m/r/g  ABD: soft, nt  EXT:, warm extremities, well perfused  NEURO: AOx4, grossly intact           Laboratory:  Lab Results   Component Value Date    WBC 7.2 12/30/2013    HGB 15.7 12/30/2013    HCT 45.2 12/30/2013    MCV 93.9 12/30/2013    PLT 179 12/30/2013     Lab Results   Component Value Date    GLUCOSE 92 12/30/2013    CREATININE 1.10 12/30/2013    BUN 19 12/30/2013    NA 138 12/30/2013    K 4.2 12/30/2013    CL 104 12/30/2013    CO2 26 12/30/2013     Lab Results   Component Value Date    INR 1.0 12/29/2013    PROTIME 12.6 12/29/2013     No results found for this basename: ALT, AST, GGT, ALKPHOS, BILITOT     No results found for this basename: COLORU, CLARITYU, PH, PROTEINUA, PHUR, LABSPEC, GLUCOSEU, KEYTONESU, BLOODU, LEUKOCYTESUR, NITRITE, BILIRUBINUR, UROBILINOGEN, RBCUA, WBCUA, BACTERIA, AMORPHOUS, CRYSTAL, CASTS       Assessment & Plan:       Cervical spinal stenosis - seen by ortho-spine, no need for urgent surgical intervention or bracing.  Will f/u as outpatient    Syncope- Orthostatic.  Cardiac work up has been negative.  Will transfer back to [facility] for further work up and management.     Hospital Day#2. I saw  and evaluated the patient. I agree with the findings and the plan of care as documented in the resident's note.      Salome Arnt, MD  12/30/2013  5:09 PM

## 2013-12-30 NOTE — Unmapped (Signed)
VC= 3.2L  NIF= -45cwp

## 2013-12-30 NOTE — Unmapped (Addendum)
CONTINUITY OF CARE FORM     Patient name: Andre Olson        Patient MRN: [id number]  DOB: January 23, 1951    Age: 63 y.o.    Gender: male  Patient emergency contact: Extended Emergency Contact Information  Primary Emergency Contact: Dietrich Pates [place]  Home Phone: [phone]  Relation: Significant other    Date of admission: 12/28/2013  Date of discharge:  12/30/2013    Attending provider: Salome Arnt, MD  Primary care physician: Marolyn Haller, MD    Code status: Full Code  Allergies: No Known Allergies    Diagnoses Present on Admission [facility] Problem List)   Primary Diagnosis: Syncope  Discharge Diagnosis :   Active Hospital Problems    Diagnosis Date Noted   ??? Syncope [780.2] 12/28/2013   ??? 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 Myasthenia gravis with exacerbation, adult form [358.01] 12/28/2013      Resolved Hospital Problems    Diagnosis Date Noted Date Resolved   No resolved problems to display.     Prognosis: good  Rehabilitation potential: good    Surgery and Date during this Admission   n/a    Diet     Diet Orders         Diet regular starting at 07/28 2114        Dysphagia Assessment and Recommendations:    As listed above    Services Required   Skilled Nursing: Yes    PT Interventions and Frequency:    PT Recommendations:      OT Interventions and Frequency:    OT Recommendations:      Speech Frequency and Recommendations:      Needs 24 hour supervision due to cognitive impairment: No    Weight bearing status: full as tolerated    Medications and Discharge Specific Orders   Medications:  Current Discharge Medication List      START taking these medications    Details   aspirin 81 MG EC tablet Take 1 tablet (81 mg total) by mouth daily with breakfast.  Qty: 30 tablet, Refills: 0      ondansetron (ZOFRAN) 4 MG tablet Take 1 tablet (4 mg total) by mouth every 6 hours as needed for Nausea.  Qty: 20 tablet, Refills: 0      ondansetron (ZOFRAN) 4 mg/2 mL Soln  injection Inject 2 mLs (4 mg total) into the vein every 6 hours as needed.         CONTINUE these medications which have CHANGED    Details   azaTHIOprine (IMURAN) 100 mg tablet Take 2 tablets (200 mg total) by mouth At bedtime.  Refills: 0         CONTINUE these medications which have NOT CHANGED    Details   azelastine (ASTELIN) 137 mcg (0.1 %) nasal spray Use 2 sprays into each nostril 2 times a day. Use in each nostril as directed      budesonide-formoterol (SYMBICORT) 80-4.5 mcg/actuation inhaler Inhale 2 puffs into the lungs 2 times a day.      buPROPion SR (WELLBUTRIN SR) 150 MG tablet Take 150 mg by mouth 2 times a day.      cyanocobalamin (VITAMIN B-12) 1000 MCG tablet Take 1,000 mcg by mouth daily.      finasteride (PROSCAR) 5 mg tablet Take 5 mg by mouth daily.      ipratropium (ATROVENT) 0.03 % nasal spray Use 2 sprays into each nostril every  12 (twelve) hours.      levothyroxine (SYNTHROID, LEVOTHROID) 112 MCG tablet Take 224 mcg by mouth daily.      midodrine (PROAMATINE) 10 MG tablet Take 10 mg by mouth 3 times a day.      montelukast (SINGULAIR) 10 mg tablet Take 10 mg by mouth at bedtime.      multivitamin capsule Take 1 capsule by mouth daily.      pantoprazole (PROTONIX) 40 MG tablet Take 40 mg by mouth every morning before breakfast.      pravastatin (PRAVACHOL) 40 MG tablet Take 40 mg by mouth at bedtime.      pyridostigmine (MESTINON) 60 mg tablet Take 60 mg by mouth 3 times a day.      tamsulosin (FLOMAX) 0.4 mg Cp24 Take 0.4 mg by mouth at bedtime.      venlafaxine (EFFEXOR) 75 MG tablet Take 75 mg by mouth 3 times a day.               Discharge specific orders:  None required    Isolation     Active Isolation    None      Removed Isolation    None        Physician Certification of Medically Necessary Transportation   Type and reason for transportation: Stretcher - patient is bedbound.  Reason for transport to another facility: continued care at [facility], home institution   Patient  requires: Continuous medical supervision enroute    Follow-up Appointments and [facility] Discharge Physician Name   Follow up with their PCP as scheduled.    Physician Signature and Credentials   Discharging Physician: Electronically signed by Barrie Folk Cerissa Zeiger  12/30/2013, 2:15 PM    SOCIAL WORK DOCUMENTATION     Facility/Agency:    Facility Name: [facility]    Number to Call Report: [phone] Ask for Obed    Level of Care at Discharge: [facility]           Less than 30 day convalescent stay: N/A    PASARR/HENS 7000 Completed: N/A    Family Member Name and Relationship Notified at Discharge: The patient is in charge of his own care         Social Worker Name and Telephone Number: Shaune Pollack, MSW, [place] [phone]                 NURSE DISCHARGE ASSESSMENT       Vitals:  Patient Vitals for the past 4 hrs:   BP Temp Temp src Pulse Resp SpO2   12/30/13 1129 131/73 mmHg 97.8 ??F (36.6 ??C) Oral 60 16 96 %        Orientation:       Orientation Level: Oriented X4    Respiratory:  Respiratory (WDL): Within Defined Limits  Respiratory Pattern: Regular  Chest Assessment: Chest expansion symmetrical  Bilateral Breath Sounds: Clear          Cardiac:  Cardiac (WDL): Exceptions to WDL  Heart Sounds: S1, S2  Pacemaker: No      Edema:  Peripheral Vascular (WDL): Within Defined Limits    Wounds:       Comfort/Mattress:       Musculoskeletal:  Musculoskeletal (WDL): Within Defined Limits    GI:  Gastrointestinal (WDL): Within Defined Limits  Last BM Date: 12/29/13    GU:  Genitourinary (WDL): Within Defined Limits    Lines and Drains:  Active Line / PIV Line  Name:   Placement date:   Placement time:   Site:   Days:    Peripheral IV Left Forearm        Forearm           ADL's:  Level of Assistance: Standby assist, set-up cues, supervision of patient - no hands on  Level of Assistance: Independent    Morse Fall Risk  Score: 35    Restraints:     Nurse and Credentials   RN Assessment Completed by: RN Giving  Report:: Jonny Ruiz 12/30/2013

## 2013-12-30 NOTE — Unmapped (Signed)
ORTHO SPINE PROGRESS NOTE    No major issues  Pain controlled  Denies numbness/tingling    Filed Vitals:    12/30/13 0100 12/30/13 0500 12/30/13 0607 12/30/13 0750   BP: 107/68 112/74  118/78   Pulse: 61 59  73   Temp: 97.4 ??F (36.3 ??C) 97.2 ??F (36.2 ??C)  97.3 ??F (36.3 ??C)   TempSrc: Oral Oral  Oral   Resp: 16 16  16    Height:       Weight:   164 lb 8 oz (74.617 kg)    SpO2: 96% 96%  99%       NAD    Upper Extremity Motor Exam   R L  D 5 5  B 5 5  T 5 5  WE 5 5  Grip 5 5  IO 5 5    Sensation:   Sensation intact to light touch in all cutaneous nerve distributions of the hand    Lower Extremity Motor Exam   R L  HF 5 5  HE 5 5  Q 5 5  H 5 5  TA 5 5  GS 5 5  EHL 5 5    Sensation:   Sensation intact in all cutaneous nerve distributions of the foot (SP, DP, Tibial, Saphenous, Sural) patient reports subjective decrease in sensation to light touch along tibial nerve distribution versus right leg but intact sensation grossly    Lab Results   Component Value Date    WBC 7.2 12/30/2013    HGB 15.7 12/30/2013    HCT 45.2 12/30/2013    MCV 93.9 12/30/2013    PLT 179 12/30/2013     Lab Results   Component Value Date    CREATININE 1.10 12/30/2013    BUN 19 12/30/2013    NA 138 12/30/2013    K 4.2 12/30/2013    CL 104 12/30/2013    CO2 26 12/30/2013     Lab Results   Component Value Date    INR 1.0 12/29/2013     No results found for this basename: SEDRATE     No results found for this basename: CRP       A/P:  63 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 Sandi Mealy one week after discharge.  -Anticoag per primary  -PTOT    Clarene Essex, MD

## 2013-12-30 NOTE — Unmapped (Signed)
Patient was discharged to [facility] hospital,he was transported by ambulance via stretcher ,report was given to [facility] nurse at [facility]

## 2014-01-05 ENCOUNTER — Ambulatory Visit

## 2014-01-28 ENCOUNTER — Inpatient Hospital Stay: Admit: 2014-01-28 | Discharge: 2014-01-28 | Attending: Emergency Medicine

## 2014-01-28 LAB — CBC WITH AUTO DIFFERENTIAL
Basophils %: 0.4 %
Basophils Absolute: 0 10*3/uL (ref 0.0–0.2)
Eosinophils %: 2.6 %
Eosinophils Absolute: 0.1 10*3/uL (ref 0.0–0.6)
Hematocrit: 41.9 % (ref 40.5–52.5)
Hemoglobin: 14.3 g/dL (ref 13.5–17.5)
Lymphocytes %: 32.3 %
Lymphocytes Absolute: 1.3 10*3/uL (ref 1.0–5.1)
MCH: 32.4 pg (ref 26.0–34.0)
MCHC: 34.2 g/dL (ref 31.0–36.0)
MCV: 94.8 fL (ref 80.0–100.0)
MPV: 7.6 fL (ref 5.0–10.5)
Monocytes %: 9.5 %
Monocytes Absolute: 0.4 10*3/uL (ref 0.0–1.3)
Neutrophils %: 55.2 %
Neutrophils Absolute: 2.3 10*3/uL (ref 1.7–7.7)
Platelets: 262 10*3/uL (ref 135–450)
RBC: 4.42 M/uL (ref 4.20–5.90)
RDW: 13 % (ref 12.4–15.4)
WBC: 4.2 10*3/uL (ref 4.0–11.0)

## 2014-01-28 LAB — COMPREHENSIVE METABOLIC PANEL
ALT: 21 U/L (ref 10–40)
AST: 33 U/L (ref 15–37)
Albumin/Globulin Ratio: 0.9 — ABNORMAL LOW (ref 1.1–2.2)
Albumin: 3.7 g/dL (ref 3.4–5.0)
Alkaline Phosphatase: 75 U/L (ref 40–129)
Anion Gap: 13 (ref 3–16)
BUN: 16 mg/dL (ref 7–20)
CO2: 21 mmol/L (ref 21–32)
Calcium: 9.2 mg/dL (ref 8.3–10.6)
Chloride: 105 mmol/L (ref 99–110)
Creatinine: 1.1 mg/dL (ref 0.8–1.3)
GFR African American: >60 (ref >60)
GFR Non-African American: >60 (ref >60)
Globulin: 4.1 g/dL
Glucose: 104 mg/dL — ABNORMAL HIGH (ref 70–99)
Potassium: 4.1 mmol/L (ref 3.5–5.1)
Sodium: 139 mmol/L (ref 136–145)
Total Bilirubin: 0.7 mg/dL (ref 0.0–1.0)
Total Protein: 7.8 g/dL (ref 6.4–8.2)

## 2014-01-28 LAB — TROPONIN: Troponin: <0.01 ng/mL (ref <0.01)

## 2014-01-28 MED ORDER — SODIUM CHLORIDE 0.9 % IV BOLUS
0.9 % | Freq: Once | INTRAVENOUS | Status: AC
Start: 2014-01-28 — End: 2014-01-28
  Administered 2014-01-28: 21:00:00 1000 mL via INTRAVENOUS

## 2014-01-28 MED FILL — SODIUM CHLORIDE 0.9 % IV SOLN: 0.9 % | INTRAVENOUS | Qty: 1000

## 2014-01-28 NOTE — ED Notes (Signed)
Pt is on a continuous pulse oximetry and telemetry monitoring. Pt continued on cycling blood pressure. Fall risk precautions in place, call light in reach, bed side table within reach, bed alarm on, will continue to monitor.          Michiel Sites, RN  01/28/14 [phone]

## 2014-01-28 NOTE — Discharge Instructions (Signed)
Orthostatic Hypotension: After Your Visit  Your Care Instructions  Orthostatic hypotension is a quick drop in blood pressure. It happens when you get up from sitting or lying down. You may feel faint, lightheaded, or dizzy.  When a person sits up or stands up, the body changes the way it pumps blood. This can slow the flow of blood to the brain for a very short time. And that can make you feel lightheaded.  Many medicines can cause this problem, especially in older people. Lack of fluids (dehydration) or illnesses such as diabetes or heart disease also can cause it.  Follow-up care is a key part of your treatment and safety. Be sure to make and go to all appointments, and call your doctor if you are having problems. It's also a good idea to know your test results and keep a list of the medicines you take.  How can you care for yourself at home?   Tell your doctor about any problems you have with your medicines.   If your doctor prescribes medicine to help prevent a low blood pressure problem, take it exactly as prescribed. Call your doctor if you think you are having a problem with your medicine.   Drink plenty of fluids, enough so that your urine is light yellow or clear like water. Choose water and other caffeine-free clear liquids. If you have kidney, heart, or liver disease and have to limit fluids, talk with your doctor before you increase the amount of fluids you drink.   Limit or avoid alcohol and caffeine.   Get up slowly from bed or after sitting for a long time. If you are in bed, roll to your side and swing your legs over the edge of the bed and onto the floor. Push your body up to a sitting position. Wait for a while before you slowly stand up. If you are dizzy or lightheaded, sit or lie down.  When should you call for help?  Call 911 anytime you think you may need emergency care. For example, call if:   You passed out (lost consciousness).  Watch closely for changes in your health, and be sure  to contact your doctor if:   You do not get better as expected.   Where can you learn more?   Go to [email or web address] and sign in to your MyChart account. Enter V984 in the Search Health Information box to learn more about "Orthostatic Hypotension: After Your Visit."    If you do not have an account, please click on the "Sign Up Now" link.      2006-2015 [employer]. Care instructions adapted under license by [facility]. This care instruction is for use with your licensed healthcare professional. If you have questions about a medical condition or this instruction, always ask your healthcare professional. Health[employer], Incorporated disclaims any warranty or liability for your use of this information.  Content Version: 10.5.422740; Current as of: July 23, 2013

## 2014-01-28 NOTE — ED Notes (Signed)
Pt resting in bed. No signs of distress. Regular respiratory pattern, normal respiratory depth, unlabored respirations.  Bed in lowest position, 2/2 bedrails up, bedside table within reach, bed wheels locked.  Denies any needs at this time.  Call light in reach. Will continue to monitor.          Michiel Sites, RN  01/28/14 [phone]

## 2014-01-28 NOTE — ED Provider Notes (Signed)
[facility] Emergency Department    Pt Name: Frank Kaiser  MRN: [id number]  Birthdate 1950/12/03  Date of evaluation: 01/28/2014  Provider: Sidonie Dickens, MD    CHIEF COMPLAINT  Chief Complaint   Patient presents with   ??? Hypotension     Pt in by squad from home.  Pt walking this afternoon and felt like he was going to pass out.  Pt has been hospitalized before for Orthostatic hypotension     HPI  Frank Kaiser is a 63 y.o. male who presents because of nearly passing out.  He has a history of myasthenia gravis and receives IV IG for treatment since his disease.  He has specialists at the [facility] hospital.  He has been told his disease affects his blood pressure and he has orthostatic hypotension.  He also takes midodrine and has been compliant with his other medicines.  His last infusion was a week ago.  He takes his blood pressure on a regular basis and noted today that he has orthostasis.  He tried to walk a short distance to the store and felt like he might pass out.  He denies any injury.  He does have some pain in the back of his neck which he typically experiences when his blood pressure gets low.  He has slight discomfort on the left side of his chest which is only present if he presses on it.  He denies any shortness of breath.  He denies any other recent illness.    REVIEW OF SYSTEMS:  No fever, no vomiting, normal fluid intake, no dysuria, no abdominal pain  See HPI for further details. Remainder of review of systems reviewed and negative.  Nursing notes reviewed.    PAST MEDICAL HISTORY  Past Medical History   Diagnosis Date   ??? Thyroid cancer (HCC)    ??? MI (myocardial infarction) (HCC)    ??? Orthostatic hypotension    ??? Myasthenia gravis (HCC)    ??? COPD (chronic obstructive pulmonary disease) (HCC)    ??? PTSD (post-traumatic stress disorder)    ??? Peripheral neuropathy (HCC)    ??? Depression      SURGICAL HISTORY  Past Surgical History   Procedure Laterality Date   ??? Total thyroidectomy     ??? Coronary  angioplasty with stent placement       MEDICATIONS:  No current facility-administered medications on file prior to encounter.     No current outpatient prescriptions on file prior to encounter.     ALLERGIES  Lamictal  FAMILY HISTORY  History reviewed. No pertinent family history.  SOCIAL HISTORY:  History   Substance Use Topics   ??? Smoking status: Current Some Day Smoker     Types: Cigars   ??? Smokeless tobacco: Not on file   ??? Alcohol Use: Yes      Comment: occ     IMMUNIZATIONS:  Noncontributory    PHYSICAL EXAM  VITAL SIGNS:  BP 102/77 mmHg   Pulse 79   Temp(Src) 97.7 ??F (36.5 ??C) (Oral)   Resp 16   Ht  (1.88 m)   Wt 164 lb (74.39 kg)   BMI 21.05 kg/m2   SpO2 97%  Constitutional:  63 y.o. male alert, cooperative, nontoxic  HENT:  Atraumatic, mucous membranes moist  Eyes:   Conjunctiva clear, no icterus  Neck:  Supple, no JVD, no signs of injury  Cardiovascular:  Regular, no rubs, no discernible murmur  Thorax & Lungs:  No accessory muscle usage,  clear, one area of point tenderness on the left chest  Abdomen:  Soft, non distended, bowel sounds present, NT  Back:  No deformity  Genitalia:  Deferred  Rectal:  Deferred  Extremities:  No cyanosis, no edema  Skin:  Warm, dry  Neurologic:  Alert, no slurred speech, no focal deficits  Psychiatric:  Affect appropriate    DIAGNOSTIC RESULTS:  Labs resulted at the time of this note reviewed.  Labs Reviewed   COMPREHENSIVE METABOLIC PANEL - Abnormal; Notable for the following:     Glucose 104 (*)     Albumin/Globulin Ratio 0.9 (*)     All other components within normal limits   CBC WITH AUTO DIFFERENTIAL   TROPONIN     EKG:  Read by me in the absence of a cardiologist shows:  Sinus rhythm, normal rate, normal conduction intervals, normal axis, no acute injury pattern, no prior EKG available for comparison     RADIOLOGY:  Plain x-rays were viewed by me: Xr Chest Portable    01/28/2014   Chest, portable upright.    Jan 28, 2014 03:07:44 PM:   INDICATION:  "low bp."                                        COMPARISON: None.      FINDINGS: The heart, lungs and mediastinal structures are within normal limits.        01/28/2014   IMPRESSION:  Normal portable chest.          ED COURSE:    Medications administered:  Medications   0.9 % sodium chloride bolus (0 mLs Intravenous Stopped 01/28/14 1822)     Filed Vitals:    01/28/14 1646 01/28/14 1701 01/28/14 1716 01/28/14 1731   BP: 140/80 132/78 130/79 129/73   Pulse: 66 66 61 64   Temp:       TempSrc:       Resp: Height:       Weight:       SpO2:         PROCEDURES:  None    CRITICAL CARE:  None    CONSULTATIONS:  Dr. Bayard Beaver from [place] in [place]    MEDICAL DECISION MAKING: [name] is a 63 y.o. male who presented because of nearly passing out with a history of orthostatic hypotension and MG.  His BPs are stable here after hydration.  I discussed the case with Dr. Bayard Beaver at the [facility] hospital as I did consider transfer for further care.  Dr. Bayard Beaver did not feel admission is warranted and did review the patient's medicine list and some notes from his recent admission.  She found a variety of medicines that can contribute to orthostasis and suggested he stop taking them over the weekend and report to her on Monday on his home blood pressure readings.  I went over the recommendations with the patient.  He immediately dismissed the idea of going off of any of them because he felt his doctor's finally found a good balance for him.  He will need to discuss this with his neurologist on Monday.  Dr Bayard Beaver also said she would speak with the prescribing physicians about his care as well.  Appropriate d/c instructions given.  Return to ED if worsening symptoms.  Referral for follow up care.     FINAL IMPRESSION:  1. Orthostatic hypotension        (Please note that I used voice recognition software to generate this note.  Occasionally words are mistranscribed despite my efforts to edit errors.)      Frank Pope, MD  01/28/14 1929

## 2014-01-29 LAB — EKG 12-LEAD
Atrial Rate: 79 {beats}/min
P Axis: 57 degrees
P-R Interval: 138 ms
Q-T Interval: 376 ms
QRS Duration: 88 ms
QTc Calculation (Bazett): 431 ms
R Axis: 47 degrees
T Axis: 71 degrees
Ventricular Rate: 79 {beats}/min

## 2014-06-17 NOTE — Unmapped (Signed)
Left message for patient, informing him that we needed the letter from [place] stated the date range he is approved to come to office and how many visits he is approved for.

## 2014-08-15 ENCOUNTER — Encounter: Attending: Family

## 2014-09-22 DIAGNOSIS — D72819 Decreased white blood cell count, unspecified: Secondary | ICD-10-CM | POA: Insufficient documentation

## 2015-06-13 DIAGNOSIS — I6522 Occlusion and stenosis of left carotid artery: Secondary | ICD-10-CM | POA: Insufficient documentation

## 2015-06-13 DIAGNOSIS — R200 Anesthesia of skin: Secondary | ICD-10-CM | POA: Insufficient documentation

## 2015-06-13 DIAGNOSIS — R202 Paresthesia of skin: Secondary | ICD-10-CM

## 2016-11-18 ENCOUNTER — Inpatient Hospital Stay

## 2016-12-17 ENCOUNTER — Ambulatory Visit: Admit: 2016-12-17 | Discharge: 2016-12-26

## 2016-12-17 DIAGNOSIS — N5201 Erectile dysfunction due to arterial insufficiency: Secondary | ICD-10-CM

## 2016-12-17 NOTE — Unmapped (Signed)
Review of Systems   Constitutional: Negative for chills, fever and malaise/fatigue.   HENT: Positive for congestion and sinus pain.    Eyes: Positive for double vision. Negative for blurred vision.   Respiratory: Positive for cough and shortness of breath.    Cardiovascular: Positive for chest pain (left sided chest pain pt Dx with PE). Negative for leg swelling.   Gastrointestinal: Positive for diarrhea. Negative for abdominal pain, constipation, nausea and vomiting.   Genitourinary: Positive for frequency and urgency. Negative for dysuria, flank pain and hematuria.   Musculoskeletal: Positive for back pain and neck pain.   Skin: Negative for itching and rash.   Neurological: Positive for dizziness and headaches.   Endo/Heme/Allergies: Bruises/bleeds easily.   Psychiatric/Behavioral: Positive for depression. The patient is nervous/anxious (PTSD).

## 2016-12-17 NOTE — Unmapped (Signed)
Patient: Andre Olson 12/17/2016  Referring physician: Provider Not In System  No address on file  Chief Complaint: erectile dysfunction    HPI: Andre Olson is a 66 y.o. male with PMH of myasthenia gravis, bilateral PE in May 2018, currently on Andre Olson, Andre Olson in December 2013, NSTEMI 2016 CAD s/p Inferior STEMI - PCI mid to distal RCA. Followed up at the [facility]. history of moderate LAD stenosis. TIA and NSTEMI in 06/2015 after receiving IVIG for Myasthenia Gravis  Non-STEMI status post left heart cath January 2017. Patent stent mid RCA. Moderate 60-70% stenosis of ostial PDA. Moderate to severe 60-70% stenosis in the mid LAD, PTSD, MDD, BPH, COPD smoking a couple cigars daily who is here to discuss his ED, peyronie's and need for testosterone replacement. He has SOB walking up and down stairs.   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 He feels he has noted a loss of the size of his penis which he has noted since about 2001 or 2002. He has not tried testosterone replacement in the past.     He has no erections in the morning. He is using a VED for erections and to minimize his peyronie's. He states this was diagnosed in 2009. He has not had penetration for a couple years now.  He states he was given viagra but he did not take it. He was scared to take the medication.     He states he has BPH. He was rx flomax and finasteride. He has been taking this for 6 years. His previous urology at the [facility] in [place]. He stated the wait to see the urologists was too long. He did have a TURP in 2016 at the [place] and a UDS on 05/24/14. He states he does not take finasteride about one year ago. He states he has never tried a day without his flomax.     Chief Complaint:  Inability to obtain erection, Loss of libido and Penile curvature during erection  Onset:  gradual  Duration:  years  Personal History:  Status:  Engaged currently  Sexual Orientation:   Heterosexual  Appearance:  Masculine    Sexual Health Inventory for Men (SHIM):         SCORE:  1-7 Severe ED   8-11 Moderate ED   12-16 Mild to Moderate ED   17-21 Mild ED      Libido:  1 1 = no interest     10 = high interest   Rigidity:  1 1 = none              10 = high   Tumescence: 1 1 - no change      10 = adequate     Orgasm:  1 1 = none              10 = very good   Partner Satisfaction: 1 1 = poor               10 = very satisfied     Contributing Factors:  cardiovascular disease, peripheral vascular disease and TURP    Prior Treatments:  Vaccum Device:  Yes    Location: penis Quality: ED Radiating: na/ Severity: n/a  Duration: chronic Timing: chronic  Context: see hpi Modifying factors: see hpi Associated Signs/Sx:  See hpi    Medical History:  Past Medical History:   Diagnosis Date   ??? Benign prostate hyperplasia    ???  Cancer (CMS Dx)    ??? COPD (chronic obstructive pulmonary disease) (CMS Dx)    ??? Coronary artery disease    ??? Depression    ??? GERD (gastroesophageal reflux disease)    ??? Hypertension    ??? Hypothyroid    ??? Myasthenia gravis (CMS Dx)    ??? Orthostatic hypotension      Past Surgical History:   Procedure Laterality Date   ??? TOTAL THYROIDECTOMY  March 1995   ??? ulnar nerve release       Medications:   Outpatient Meds:  Previous Medications    ATORVASTATIN (LIPITOR) 40 MG TABLET    Take by mouth.    AZATHIOPRINE (IMURAN) 100 MG TABLET    Take 2 tablets (200 mg total) by mouth At bedtime.    AZELASTINE (ASTELIN) 137 MCG (0.1 %) NASAL SPRAY    Use 2 sprays into each nostril 2 times a day. Use in each nostril as directed    BACLOFEN (LIORESAL) 10 MG TABLET    Take by mouth.    BUDESONIDE-FORMOTEROL (SYMBICORT) 80-4.5 MCG/ACTUATION INHALER    Inhale 2 puffs into the lungs 2 times a day.    BUPROPION SR (WELLBUTRIN SR) 150 MG TABLET    Take 150 mg by mouth 2 times a day.    CLOPIDOGREL (PLAVIX) 75 MG TABLET    Take by mouth.    CYANOCOBALAMIN (VITAMIN B-12) 1000 MCG TABLET    Take 1,000 mcg by mouth  daily.    DIVALPROEX (DEPAKOTE) 125 MG DELAYED RELEASE TABLET    Take by mouth.    FINASTERIDE (PROSCAR) 5 MG TABLET    Take 5 mg by mouth daily.    HYDROCODONE-ACETAMINOPHEN (NORCO) 5-325 MG PER TABLET    TK 1 T PO  BID PRN P    IPRATROPIUM (ATROVENT) 0.03 % NASAL SPRAY    Use 2 sprays into each nostril every 12 (twelve) hours.    IPRATROPIUM-ALBUTEROL (DUO-NEB) 0.5 MG-3 MG(2.5 MG BASE)/3 ML NEBULIZER SOLUTION    Inhale into the lungs.    LEVOTHYROXINE (SYNTHROID, LEVOTHROID) 112 MCG TABLET    Take 224 mcg by mouth daily.    MIDODRINE (PROAMATINE) 10 MG TABLET    Take 10 mg by mouth 3 times a day.    MONTELUKAST (SINGULAIR) 10 MG TABLET    Take 10 mg by mouth at bedtime.    MULTIVITAMIN CAPSULE    Take 1 capsule by mouth daily.    OMEPRAZOLE (PRILOSEC) 40 MG CAPSULE    Take by mouth.    ONDANSETRON (ZOFRAN) 4 MG TABLET    Take 1 tablet (4 mg total) by mouth every 6 hours as needed for Nausea.    ONDANSETRON (ZOFRAN) 4 MG/2 ML SOLN INJECTION    Inject 2 mLs (4 mg total) into the vein every 6 hours as needed.    PANTOPRAZOLE (PROTONIX) 40 MG TABLET    Take 40 mg by mouth every morning before breakfast.    PRAVASTATIN (PRAVACHOL) 40 MG TABLET    Take 40 mg by mouth at bedtime.    PYRIDOSTIGMINE (MESTINON) 60 MG TABLET    Take 60 mg by mouth 3 times a day.    PYRIDOSTIGMINE (MESTINON) 60 MG TABLET    Take by mouth.    RIVAROXABAN (XARELTO) 20 MG TAB    Take by mouth.    TAMSULOSIN (FLOMAX) 0.4 MG CP24    Take 0.4 mg by mouth at bedtime.    UNABLE TO FIND    inhaler  VENLAFAXINE (EFFEXOR) 75 MG TABLET    Take 75 mg by mouth 3 times a day.     Allergies:   Allergies   Allergen Reactions   ??? Dilaudid  [Hydromorphone (Bulk)]      hallucinations   ??? Lamotrigine Hives   SH:   Social History   Substance Use Topics   ??? Smoking status: Former Smoker     Packs/day: 1.00     Years: 36.00     Types: Cigarettes     Quit date: 02/02/2000   ??? Smokeless tobacco: Never Used   ??? Alcohol use 7.2 - 8.4 oz/week     12 - 14 Cans of beer per  week   1 - 1.5 ppd x 36 years  2 beers daily    FH: no Fhx of GU malignancies     ROS: see MA note    The following portions of the patient's history were reviewed and updated as appropriate: past medical history, past surgical history, current medications, allergies, past social history, past family history, problem list.    Objective:  Vitals:    12/17/16 0844   BP: 138/80   Pulse: 90   SpO2: 95%   Physical Exam  Constitutional: Appears well-developed and well-nourished. No distress. White male   Head: Normocephalic and atraumatic.   Eyes: Conjunctivae and EOM are normal.   Ears: Normal hearing and external ear.  Neck: Normal range of motion. No lymphadenopathy.   Cardiovascular: s1 s2  Pulmonary: Effort normal. No respiratory distress. Equal chest rise bilaterally  Abdominal: Soft. Nontender, nondistended. No rebound or guarding.   Musculoskeletal: Normal range of motion.   Neurological: Alert and oriented to person, place, and time.   Skin: Skin is warm and dry.   Psychiatric: Behavior is normal.  GU: Bilaterally descended testes. Normal penis. No scrotal/testicular masses. Normal palpable cord structures.   Labs:  No results for input(s): WBC, HGB, HCT, PLT in the last 72 hours.  No results for input(s): NA, K, CL, CO2, BUN, CREATININE, GLUCOSE, CALCIUM, MG, PHOS in the last 72 hours.  No results for input(s): INR, PROTIME in the last 72 hours.  No results found for: PSA    No results found for: COLORU, CLARITYU, PH, PROTEINUA, PHUR, LABSPEC, GLUCOSEU, BLOODU, LEUKOCYTESUR, NITRITE, BILIRUBINUR, UROBILINOGEN, RBCUA, WBCUA, BACTERIA, AMORPHOUS, CRYSTAL, CASTS    Imaging:   CONCLUSIONS    SUMMARY:    1. Left ventricle: The cavity size was normal. Wall thickness was  normal. Systolic function was at the lower limits of normal. The  estimated ejection fraction was in the range of 50% to 55%. Wall  motion was normal; there were no regional wall motion  abnormalities. Doppler parameters are consistent with  abnormal  left ventricular relaxation (grade 1 diastolic dysfunction).  2. Right ventricle: The cavity size was mildly to moderately  dilated. Wall thickness was normal. Systolic function was  normal.  3. Right atrium: The atrium was dilated.  4. Pulmonary arteries: Estimated PA peak pressure is 16mm Hg (S).    Impressions: Comparison was made to the study of 06/14/2015.  Ejection fraction appears to have slightly improved.     Assessment:  Andre Olson is a 66 y.o. male   1. ED  2. Peyronie's    Plan:  Discussed need for cardiac clearance to be on a PDE5i. Would like to ensure patient is not in need of nitrates nor at increased cardiac risk with PDE5i given his previous exertional angina worked  up in December. Would have patient return to discuss next steps based on this.     Discussed the etiology of ED and how his other medical conditions are impacting his erectile abilities and health. Discussed options for treatment of ED:     First line treatment are PDE5 inhibitors such as Viagra, Cialis, Levitra, and Stendra. Discussed that Viagra is best taken on an empty stomach and the duration of onset and action of each. Discussed side effects such as headache, blue vision, congestion, back pain, priapism, etc.      Discussed options for treatment of erectile dysfunction when oral PDE5 inhibitors fail:  Vacuum erection device (VED) -  Which he has used in the past   -discussed importance of having a regulator and a breakable constriction ring. He is not on blood thinners and would be a candidate for this therapy. Advised that this could only be used for ~30 minutes due to lack of blood flow to penis during constriction.   Intracorporal injection therapy (ICI)   -discussed side effects of priapism, penile pain, bruising and scarring at injection site   Intraurethral suppository (MUSE)   -discussed side effects of low blood pressure, penile pain, and priapism   Inflatable penile prosthesis (IPP   -discussed that  this is a surgical procedure with risk of bleeding, infection, device failure.     Discussed treatment options for peyronie's. Would need to evaluate degree of curvature first. Patient has a picture he will bring with him at his next visit. Did recommend a visit for an induced erection and measurement of his curvature. Discussed no treatement, xiaflex, versus plication.     All questions answered.     He will follow up after cardiac clearance.   Medical Decision Making  The following items were considered in medical decision making:  Obtain records and history from outside facility/provider  Review / order clinical lab tests  Review / order other diagnostic tests/interventions  Reviewed outside records    Mikenzi Raysor M. Allena Katz, MD

## 2017-01-14 ENCOUNTER — Ambulatory Visit

## 2017-05-01 NOTE — Unmapped (Signed)
Received request from [place] for last office notes 12/17/2016, and appt date and time. Faxed back successfully on this date. Pt is not scheduled for an appt at this time.

## 2018-02-06 ENCOUNTER — Emergency Department (HOSPITAL_COMMUNITY): Payer: Medicare HMO

## 2018-02-06 ENCOUNTER — Encounter (HOSPITAL_COMMUNITY): Payer: Self-pay | Admitting: Emergency Medicine

## 2018-02-06 ENCOUNTER — Inpatient Hospital Stay (HOSPITAL_COMMUNITY)
Admission: EM | Admit: 2018-02-06 | Discharge: 2018-03-05 | DRG: 870 | Disposition: A | Discharge status: Home Health | Payer: Medicare HMO | Attending: Internal Medicine | Admitting: Internal Medicine

## 2018-02-06 ENCOUNTER — Other Ambulatory Visit: Payer: Self-pay

## 2018-02-06 DIAGNOSIS — Z888 Allergy status to other drugs, medicaments and biological substances status: Secondary | ICD-10-CM

## 2018-02-06 DIAGNOSIS — M3500 Sicca syndrome, unspecified: Secondary | ICD-10-CM | POA: Diagnosis present

## 2018-02-06 DIAGNOSIS — J9601 Acute respiratory failure with hypoxia: Secondary | ICD-10-CM

## 2018-02-06 DIAGNOSIS — I2699 Other pulmonary embolism without acute cor pulmonale: Secondary | ICD-10-CM

## 2018-02-06 DIAGNOSIS — J441 Chronic obstructive pulmonary disease with (acute) exacerbation: Secondary | ICD-10-CM

## 2018-02-06 DIAGNOSIS — Z8701 Personal history of pneumonia (recurrent): Secondary | ICD-10-CM

## 2018-02-06 DIAGNOSIS — K219 Gastro-esophageal reflux disease without esophagitis: Secondary | ICD-10-CM | POA: Diagnosis present

## 2018-02-06 DIAGNOSIS — A403 Sepsis due to Streptococcus pneumoniae: Secondary | ICD-10-CM | POA: Diagnosis not present

## 2018-02-06 DIAGNOSIS — Z01818 Encounter for other preprocedural examination: Secondary | ICD-10-CM

## 2018-02-06 DIAGNOSIS — Z885 Allergy status to narcotic agent status: Secondary | ICD-10-CM

## 2018-02-06 DIAGNOSIS — Z955 Presence of coronary angioplasty implant and graft: Secondary | ICD-10-CM

## 2018-02-06 DIAGNOSIS — A419 Sepsis, unspecified organism: Secondary | ICD-10-CM | POA: Diagnosis present

## 2018-02-06 DIAGNOSIS — G9349 Other encephalopathy: Secondary | ICD-10-CM | POA: Diagnosis not present

## 2018-02-06 DIAGNOSIS — R5381 Other malaise: Secondary | ICD-10-CM | POA: Diagnosis not present

## 2018-02-06 DIAGNOSIS — J449 Chronic obstructive pulmonary disease, unspecified: Secondary | ICD-10-CM | POA: Diagnosis not present

## 2018-02-06 DIAGNOSIS — Z86711 Personal history of pulmonary embolism: Secondary | ICD-10-CM

## 2018-02-06 DIAGNOSIS — F419 Anxiety disorder, unspecified: Secondary | ICD-10-CM | POA: Diagnosis present

## 2018-02-06 DIAGNOSIS — Z79899 Other long term (current) drug therapy: Secondary | ICD-10-CM

## 2018-02-06 DIAGNOSIS — J80 Acute respiratory distress syndrome: Secondary | ICD-10-CM

## 2018-02-06 DIAGNOSIS — F17210 Nicotine dependence, cigarettes, uncomplicated: Secondary | ICD-10-CM | POA: Diagnosis present

## 2018-02-06 DIAGNOSIS — F28 Other psychotic disorder not due to a substance or known physiological condition: Secondary | ICD-10-CM | POA: Diagnosis not present

## 2018-02-06 DIAGNOSIS — G7001 Myasthenia gravis with (acute) exacerbation: Secondary | ICD-10-CM | POA: Diagnosis present

## 2018-02-06 DIAGNOSIS — T380X5A Adverse effect of glucocorticoids and synthetic analogues, initial encounter: Secondary | ICD-10-CM | POA: Diagnosis present

## 2018-02-06 DIAGNOSIS — R0602 Shortness of breath: Secondary | ICD-10-CM | POA: Diagnosis not present

## 2018-02-06 DIAGNOSIS — R040 Epistaxis: Secondary | ICD-10-CM | POA: Diagnosis not present

## 2018-02-06 DIAGNOSIS — I2510 Atherosclerotic heart disease of native coronary artery without angina pectoris: Secondary | ICD-10-CM | POA: Diagnosis present

## 2018-02-06 DIAGNOSIS — Z7982 Long term (current) use of aspirin: Secondary | ICD-10-CM

## 2018-02-06 DIAGNOSIS — Z9289 Personal history of other medical treatment: Secondary | ICD-10-CM

## 2018-02-06 DIAGNOSIS — Z79891 Long term (current) use of opiate analgesic: Secondary | ICD-10-CM

## 2018-02-06 DIAGNOSIS — I5033 Acute on chronic diastolic (congestive) heart failure: Secondary | ICD-10-CM | POA: Diagnosis present

## 2018-02-06 DIAGNOSIS — G8929 Other chronic pain: Secondary | ICD-10-CM | POA: Diagnosis present

## 2018-02-06 DIAGNOSIS — E785 Hyperlipidemia, unspecified: Secondary | ICD-10-CM | POA: Diagnosis present

## 2018-02-06 DIAGNOSIS — J181 Lobar pneumonia, unspecified organism: Secondary | ICD-10-CM | POA: Diagnosis not present

## 2018-02-06 DIAGNOSIS — Z7951 Long term (current) use of inhaled steroids: Secondary | ICD-10-CM

## 2018-02-06 DIAGNOSIS — Z574 Occupational exposure to toxic agents in agriculture: Secondary | ICD-10-CM

## 2018-02-06 DIAGNOSIS — F418 Other specified anxiety disorders: Secondary | ICD-10-CM | POA: Diagnosis not present

## 2018-02-06 DIAGNOSIS — Z6820 Body mass index (BMI) 20.0-20.9, adult: Secondary | ICD-10-CM

## 2018-02-06 DIAGNOSIS — G7000 Myasthenia gravis without (acute) exacerbation: Secondary | ICD-10-CM

## 2018-02-06 DIAGNOSIS — D649 Anemia, unspecified: Secondary | ICD-10-CM | POA: Diagnosis present

## 2018-02-06 DIAGNOSIS — J849 Interstitial pulmonary disease, unspecified: Secondary | ICD-10-CM

## 2018-02-06 DIAGNOSIS — J189 Pneumonia, unspecified organism: Secondary | ICD-10-CM

## 2018-02-06 DIAGNOSIS — E039 Hypothyroidism, unspecified: Secondary | ICD-10-CM | POA: Diagnosis present

## 2018-02-06 DIAGNOSIS — F329 Major depressive disorder, single episode, unspecified: Secondary | ICD-10-CM | POA: Diagnosis present

## 2018-02-06 DIAGNOSIS — I4891 Unspecified atrial fibrillation: Secondary | ICD-10-CM | POA: Diagnosis not present

## 2018-02-06 DIAGNOSIS — R042 Hemoptysis: Secondary | ICD-10-CM | POA: Diagnosis present

## 2018-02-06 DIAGNOSIS — Z77098 Contact with and (suspected) exposure to other hazardous, chiefly nonmedicinal, chemicals: Secondary | ICD-10-CM | POA: Diagnosis present

## 2018-02-06 DIAGNOSIS — Z720 Tobacco use: Secondary | ICD-10-CM | POA: Diagnosis present

## 2018-02-06 DIAGNOSIS — D72829 Elevated white blood cell count, unspecified: Secondary | ICD-10-CM | POA: Diagnosis present

## 2018-02-06 DIAGNOSIS — Z7901 Long term (current) use of anticoagulants: Secondary | ICD-10-CM

## 2018-02-06 DIAGNOSIS — J440 Chronic obstructive pulmonary disease with acute lower respiratory infection: Secondary | ICD-10-CM | POA: Diagnosis present

## 2018-02-06 DIAGNOSIS — R112 Nausea with vomiting, unspecified: Secondary | ICD-10-CM

## 2018-02-06 DIAGNOSIS — A219 Tularemia, unspecified: Secondary | ICD-10-CM | POA: Diagnosis present

## 2018-02-06 DIAGNOSIS — J9690 Respiratory failure, unspecified, unspecified whether with hypoxia or hypercapnia: Secondary | ICD-10-CM

## 2018-02-06 DIAGNOSIS — J984 Other disorders of lung: Secondary | ICD-10-CM | POA: Diagnosis present

## 2018-02-06 DIAGNOSIS — R0902 Hypoxemia: Secondary | ICD-10-CM

## 2018-02-06 DIAGNOSIS — J9600 Acute respiratory failure, unspecified whether with hypoxia or hypercapnia: Secondary | ICD-10-CM

## 2018-02-06 DIAGNOSIS — Z4659 Encounter for fitting and adjustment of other gastrointestinal appliance and device: Secondary | ICD-10-CM

## 2018-02-06 DIAGNOSIS — Z978 Presence of other specified devices: Secondary | ICD-10-CM

## 2018-02-06 DIAGNOSIS — E440 Moderate protein-calorie malnutrition: Secondary | ICD-10-CM

## 2018-02-06 HISTORY — DX: Tobacco use: Z72.0

## 2018-02-06 HISTORY — DX: Bronchiectasis, uncomplicated: J47.9

## 2018-02-06 HISTORY — DX: Chronic obstructive pulmonary disease, unspecified: J44.9

## 2018-02-06 HISTORY — DX: Sjogren syndrome, unspecified: M35.00

## 2018-02-06 HISTORY — DX: Other specified anxiety disorders: F41.8

## 2018-02-06 HISTORY — DX: Hypothyroidism, unspecified: E03.9

## 2018-02-06 HISTORY — DX: Other pulmonary embolism without acute cor pulmonale: I26.99

## 2018-02-06 HISTORY — DX: Myasthenia gravis without (acute) exacerbation: G70.00

## 2018-02-06 HISTORY — DX: Gastro-esophageal reflux disease without esophagitis: K21.9

## 2018-02-06 HISTORY — DX: Atherosclerotic heart disease of native coronary artery without angina pectoris: I25.10

## 2018-02-06 LAB — CBC WITH DIFFERENTIAL/PLATELET
Abs Immature Granulocytes: 0.1 10*3/uL (ref 0.0–0.1)
Basophils Absolute: 0 10*3/uL (ref 0.0–0.1)
Basophils Relative: 0 %
EOS PCT: 1 %
Eosinophils Absolute: 0.1 10*3/uL (ref 0.0–0.7)
HCT: 41.4 % (ref 39.0–52.0)
Hemoglobin: 13.8 g/dL (ref 13.0–17.0)
Immature Granulocytes: 1 %
Lymphocytes Relative: 6 %
Lymphs Abs: 0.7 10*3/uL (ref 0.7–4.0)
MCH: 32.1 pg (ref 26.0–34.0)
MCHC: 33.3 g/dL (ref 30.0–36.0)
MCV: 96.3 fL (ref 78.0–100.0)
Monocytes Absolute: 0.7 10*3/uL (ref 0.1–1.0)
Monocytes Relative: 5 %
Neutro Abs: 11.6 10*3/uL — ABNORMAL HIGH (ref 1.7–7.7)
Neutrophils Relative %: 87 %
Platelets: 188 10*3/uL (ref 150–400)
RBC: 4.3 MIL/uL (ref 4.22–5.81)
RDW: 13 % (ref 11.5–15.5)
WBC: 13.3 10*3/uL — AB (ref 4.0–10.5)

## 2018-02-06 LAB — BRAIN NATRIURETIC PEPTIDE: B Natriuretic Peptide: 89 pg/mL (ref 0.0–100.0)

## 2018-02-06 LAB — BASIC METABOLIC PANEL
Anion gap: 12 (ref 5–15)
BUN: 15 mg/dL (ref 8–23)
CHLORIDE: 105 mmol/L (ref 98–111)
CO2: 21 mmol/L — ABNORMAL LOW (ref 22–32)
CREATININE: 1.17 mg/dL (ref 0.61–1.24)
Calcium: 8.5 mg/dL — ABNORMAL LOW (ref 8.9–10.3)
GLUCOSE: 105 mg/dL — AB (ref 70–99)
POTASSIUM: 4.3 mmol/L (ref 3.5–5.1)
Sodium: 138 mmol/L (ref 135–145)

## 2018-02-06 LAB — I-STAT TROPONIN, ED: TROPONIN I, POC: 0.02 ng/mL (ref 0.00–0.08)

## 2018-02-06 MED ORDER — SODIUM CHLORIDE 0.9 % IV SOLN
500.0000 mg | Freq: Once | INTRAVENOUS | Status: AC
  Administered 2018-02-07: 500 mg via INTRAVENOUS

## 2018-02-06 MED ORDER — ALBUTEROL SULFATE (2.5 MG/3ML) 0.083% IN NEBU
5.0000 mg | INHALATION_SOLUTION | Freq: Once | RESPIRATORY_TRACT | Status: AC
  Administered 2018-02-06: 5 mg via RESPIRATORY_TRACT
  Filled 2018-02-06: qty 6

## 2018-02-06 MED ORDER — SODIUM CHLORIDE 0.9 % IV SOLN
1.0000 g | Freq: Once | INTRAVENOUS | Status: AC
  Administered 2018-02-07: 1 g via INTRAVENOUS
  Filled 2018-02-06: qty 10

## 2018-02-06 NOTE — ED Triage Notes (Signed)
Pt presents with chest tightness with sob, cough, congestion x 3 days; pt denies fever; productive cough with green phlem

## 2018-02-06 NOTE — H&P (Signed)
History and Physical    Curtis Preston ZOX:[id number] DOB: September 28, 1950 DOA: 02/06/2018  Referring MD/NP/PA:   PCP: No primary care provider on file.   Patient coming from:  The patient is coming from home.  At baseline, pt is independent for most of ADL.   Chief Complaint: Cough, shortness of breath  HPI: Curtis Preston is a 67 y.o. male with medical history significant of COPD, tobacco abuse, GERD, hypothyroidism, depression, mild venous gravis, CAD, stent placement, PE on Xarelto, who presents with cough and shortness breath.  Patient states that he has been having shortness of breath and cough for 3 days, which has worsened today.  He has productive cough with greenish colored sputum production.  Patient does not have chest pain, but states that he has chest tightness. Patient does not have fever, but has chills.  Patient denies nausea, vomiting, diarrhea, abdominal pain, symptoms of UTI or unilateral weakness.  Patient states that he is taking Imuran and pyridostigmine for myasthenia gravis.  He feels that his myasthenic gravis is stable.  ED Course: pt was found to have WBC 13.3, creatinine 1.17, negative troponin, BNP 89, temperature normal, heart rate initially 99-->79, tachypnea, oxygen saturation 90 to 97% on room air.  Chest x-ray showed multifocal patchy infiltration.  Patient is placed on telemetry bed of observation.  Review of Systems:   General: no fevers, has chills, no body weight gain, fatigue HEENT: no blurry vision, hearing changes or sore throat Respiratory: has dyspnea, coughing, no wheezing CV: no chest pain, no palpitations GI: no nausea, vomiting, abdominal pain, diarrhea, constipation GU: no dysuria, burning on urination, increased urinary frequency, hematuria  Ext: no leg edema Neuro: no unilateral weakness, numbness, or tingling, no vision change or hearing loss Skin: no rash, no skin tear. MSK: No muscle spasm, no deformity, no limitation of range of  movement in spin Heme: No easy bruising.  Travel history: No recent long distant travel.  Allergy:  Allergies  Allergen Reactions  . Hydromorphone Other (See Comments)    hallucinations   . Immune Globulin Other (See Comments)     Bilateral pulmonary embolism      Past Medical History:  Diagnosis Date  . CAD (coronary artery disease)   . COPD (chronic obstructive pulmonary disease) (HCC)   . Depression with anxiety   . GERD (gastroesophageal reflux disease)   . Hypothyroidism   . Myasthenia gravis (HCC)   . PE (pulmonary thromboembolism) (HCC)   . Tobacco abuse     Past Surgical History:  Procedure Laterality Date  . Right ulnar nerve impingement      Social History:  reports that he has been smoking cigarettes. He has been smoking about 0.50 packs per day. He does not have any smokeless tobacco history on file. He reports that he drinks alcohol. He reports that he does not use drugs.  Family History:  Family History  Problem Relation Age of Onset  . Heart attack Father      Prior to Admission medications   Medication Sig Start Date End Date Taking? Authorizing Provider  aspirin (ASPIRIN 81) 81 MG EC tablet Take 81 mg by mouth daily. 06/01/12  Yes [provider]  atorvastatin (LIPITOR) 40 MG tablet Take 40 mg by mouth at bedtime.   Yes [provider]  azathioprine (IMURAN) 100 MG tablet Take 200 mg by mouth at bedtime. 12/30/13  Yes [provider]  azelastine (ASTELIN) 0.1 % nasal spray Place 2 sprays into the nose  2 (two) times daily.   Yes [provider]  baclofen (LIORESAL) 10 MG tablet Take 10 mg by mouth 3 (three) times daily. 01/15/18  Yes [provider]  benzonatate (TESSALON) 200 MG capsule Take 1 capsule by mouth as needed for cough.   Yes [provider]  buPROPion (WELLBUTRIN SR) 150 MG 12 hr tablet Take 150 mg by mouth 2 (two) times daily.   Yes [provider]  Calcium Carb-Ergocalciferol  500-200 MG-UNIT TABS Take 1 tablet by mouth daily.   Yes [provider]  fluticasone (FLONASE) 50 MCG/ACT nasal spray Place 1 spray into the nose 2 (two) times daily.   Yes [provider]  guaiFENesin-dextromethorphan (ROBITUSSIN DM) 100-10 MG/5ML syrup Take 10 mLs by mouth 2 (two) times daily as needed for cough.  07/31/17  Yes [provider]  HYDROcodone-acetaminophen (NORCO/VICODIN) 5-325 MG tablet Take 1 tablet by mouth 3 (three) times daily as needed. 01/15/18  Yes [provider]  ipratropium (ATROVENT) 0.03 % nasal spray Place 1 spray into both nostrils every 12 (twelve) hours.   Yes [provider]  ipratropium-albuterol (DUONEB) 0.5-2.5 (3) MG/3ML SOLN Inhale 3 mLs into the lungs every 6 (six) hours. 10/21/16  Yes [provider]  levothyroxine (SYNTHROID, LEVOTHROID) 175 MCG tablet Take 175 mcg by mouth daily before breakfast.   Yes [provider]  mirtazapine (REMERON) 15 MG tablet Take 30 mg by mouth at bedtime.   Yes [provider]  montelukast (SINGULAIR) 10 MG tablet Take 10 mg by mouth at bedtime.   Yes [provider]  morphine (MS CONTIN) 15 MG 12 hr tablet Take 15 mg by mouth at bedtime. 01/15/18  Yes [provider]  Multiple Vitamins-Minerals (ICAPS AREDS 2 PO) Take 1 tablet by mouth 2 (two) times daily.   Yes [provider]  omeprazole (PRILOSEC) 40 MG capsule Take 40 mg by mouth 2 (two) times daily. 10/21/16  Yes [provider]  ondansetron (ZOFRAN) 4 MG tablet Take 4 mg by mouth every 6 (six) hours as needed for nausea or vomiting.  12/30/13  Yes [provider]  pregabalin (LYRICA) 50 MG capsule Take 50 mg by mouth at bedtime.   Yes [provider]  pyridostigmine (MESTINON) 60 MG tablet Take 60 mg by mouth 4 (four) times daily.  10/21/16  Yes [provider]  rivaroxaban (XARELTO) 20 MG TABS tablet Take 20 mg by mouth daily. 11/09/16  Yes  [provider]  Tiotropium Bromide-Olodaterol (STIOLTO RESPIMAT) 2.5-2.5 MCG/ACT AERS Inhale 2 puffs into the lungs daily.   Yes [provider]  venlafaxine (EFFEXOR) 75 MG tablet Take 225 mg by mouth daily.   Yes [provider]  vitamin B-12 (CYANOCOBALAMIN) 1000 MCG tablet Take 1,000 mcg by mouth daily.   Yes [provider]    Physical Exam: Vitals:   02/07/18 0030 02/07/18 0045 02/07/18 0115 02/07/18 0117  BP: 105/67 106/71 113/69   Pulse: 72 73 78   Resp: (!) 26 19 20    Temp:   98.2 F (36.8 C)   TempSrc:   Oral   SpO2: 97% 96% 95%   Weight:    74.2 kg  Height:    6\' 1"  (1.854 m)   General: Not in acute distress HEENT:       Eyes: PERRL, EOMI, no scleral icterus.       ENT: No discharge from the ears and nose, no pharynx injection, no tonsillar enlargement.  Neck: No JVD, no bruit, no mass felt. Heme: No neck lymph node enlargement. Cardiac: S1/S2, RRR, No murmurs, No gallops or rubs. Respiratory: decreased air movement bilaterally. No rales, wheezing, rhonchi or rubs. GI: Soft, nondistended, nontender, no rebound pain, no organomegaly, BS present. GU: No hematuria Ext: No pitting leg edema bilaterally. 2+DP/PT pulse bilaterally. Musculoskeletal: No joint deformities, No joint redness or warmth, no limitation of ROM in spin. Skin: No rashes.  Neuro: Alert, oriented X3, cranial nerves II-XII grossly intact, moves all extremities normally.  Psych: Patient is not psychotic, no suicidal or hemocidal ideation.  Labs on Admission: I have personally reviewed following labs and imaging studies  CBC: Recent Labs  Lab 02/06/18 2028  WBC 13.3*  NEUTROABS 11.6*  HGB 13.8  HCT 41.4  MCV 96.3  PLT 188   Basic Metabolic Panel: Recent Labs  Lab 02/06/18 2028  NA 138  K 4.3  CL 105  CO2 21*  GLUCOSE 105*  BUN 15  CREATININE 1.17  CALCIUM 8.5*   GFR: Estimated Creatinine Clearance: 62.5 mL/min (by C-G formula based on SCr  of 1.17 mg/dL). Liver Function Tests: No results for input(s): AST, ALT, ALKPHOS, BILITOT, PROT, ALBUMIN in the last 168 hours. No results for input(s): LIPASE, AMYLASE in the last 168 hours. No results for input(s): AMMONIA in the last 168 hours. Coagulation Profile: No results for input(s): INR, PROTIME in the last 168 hours. Cardiac Enzymes: No results for input(s): CKTOTAL, CKMB, CKMBINDEX, TROPONINI in the last 168 hours. BNP (last 3 results) No results for input(s): PROBNP in the last 8760 hours. HbA1C: No results for input(s): HGBA1C in the last 72 hours. CBG: No results for input(s): GLUCAP in the last 168 hours. Lipid Profile: No results for input(s): CHOL, HDL, LDLCALC, TRIG, CHOLHDL, LDLDIRECT in the last 72 hours. Thyroid Function Tests: No results for input(s): TSH, T4TOTAL, FREET4, T3FREE, THYROIDAB in the last 72 hours. Anemia Panel: No results for input(s): VITAMINB12, FOLATE, FERRITIN, TIBC, IRON, RETICCTPCT in the last 72 hours. Urine analysis: No results found for: COLORURINE, APPEARANCEUR, LABSPEC, PHURINE, GLUCOSEU, HGBUR, BILIRUBINUR, KETONESUR, PROTEINUR, UROBILINOGEN, NITRITE, LEUKOCYTESUR Sepsis Labs: @LABRCNTIP (procalcitonin:4,lacticidven:4) )No results found for this or any previous visit (from the past 240 hour(s)).   Radiological Exams on Admission: Dg Chest 2 View  Result Date: 02/06/2018 CLINICAL DATA:  Shortness of breath cough and congestion for 3 days. EXAM: CHEST - 2 VIEW COMPARISON:  None. FINDINGS: Left subclavian injectable port terminates at the expected location of the cavoatrial junction. Cardiomediastinal silhouette is normal. Mediastinal contours appear intact. There is no evidence of pleural effusion or pneumothorax. Patchy interstitial and alveolar opacities in bilateral lungs, with particular prominence in the right upper lobe, right middle lobe and left lower lobe. Possible pulmonary nodule in the right mid lung field. Osseous structures are  without acute abnormality. Soft tissues are grossly normal. IMPRESSION: Patchy interstitial and alveolar opacities in bilateral lungs. Findings may represent multifocal pneumonia or developing asymmetric pulmonary edema. Possible pulmonary nodule in the right mid lung field. Electronically Signed   By: Ted Mcalpine M.D.   On: 02/06/2018 21:12     EKG: Independently reviewed.  Sinus rhythm, QTC 459, no ischemic change.  Assessment/Plan Principal Problem:   Lobar pneumonia (HCC) Active Problems:   CAD (coronary artery disease)   COPD (chronic obstructive pulmonary disease) (HCC)   Depression with anxiety   GERD (gastroesophageal reflux disease)   Hypothyroidism   Myasthenia gravis (HCC)   PE (pulmonary thromboembolism) (HCC)   Tobacco  abuse   Sepsis (HCC)   Sepsis due to lobar pneumonia [facility]): Patient meets criteria for sepsis with leukocytosis and tachypnea.  Currently hemodynamically stable.  Due to immunosuppressed status, patient will need antibiotics with broad coverage.  - Will place on telemetry bed for obs. - IV Vancomycin and cefepime, azithromycin (patient received 1 dose of Rocephin in ED) - prn Albuterol Nebs, DuoNeb prn for SOB - Singulair and spirava - Urine legionella and S. pneumococcal antigen - Follow up blood culture x2, sputum culture and respiratory virus panel - will get Procalcitonin and trend lactic acid level per sepsis protocol - IVF: 2L of NS bolus in ED, followed by 100 mL per hour of NS   COPD (chronic obstructive pulmonary disease) (HCC): no wheezing or rhochi, no acute exacerbation -Breathing treatment as above  CAD (coronary artery disease): s/p of stent. No CP -ASA and lipitor  HLD: -Lipitor  GERD: -Protonix  Hypothyroidism:  -Continue home Synthroid  Myasthenia gravis: -on Imuran and pyridostigmine  PE (pulmonary thromboembolism) (HCC): -on Xarelto  Tobacco abuse -Nicotine patch  Depression with anxiety: No SI or  HI -Continue home medications.   DVT ppx: Xarelto Code Status: Full code Family Communication: None at bed side.  Disposition Plan:  Anticipate discharge back to previous home environment Consults called:  none Admission status: Obs / tele   Date of Service 02/07/2018    Lorretta Harp [facility] Pager [phone]  If 7PM-7AM, please contact night-coverage [email or web address] Password [email or web address] 02/07/2018, 7:18 AM

## 2018-02-06 NOTE — ED Provider Notes (Signed)
Patient placed in Quick Look pathway, seen and evaluated   Chief Complaint: Chest tightness  HPI:   Curtis Preston presents today with chest tightness.  He has been having cough with increased sputum production.  He has history of COPD, PE, CAD, still smokes 3/4 of a pack a day.  He was seen at urgent care and given given a neb treatment.   ROS: No fever  Physical Exam:   Gen: No distress  Neuro: Awake and Alert  Skin: Warm    Focused Exam: Lungs with coarse breath sounds bilaterally.    Initiation of care has begun. The patient has been counseled on the process, plan, and necessity for staying for the completion/evaluation, and the remainder of the medical screening examination    Norman Clay 02/06/18 2019    Margarita Grizzle, MD 02/12/18 1356

## 2018-02-06 NOTE — ED Provider Notes (Addendum)
[facility] EMERGENCY DEPARTMENT Provider Note   CSN: [id number] Arrival date & time: 02/06/18  2007     History   Chief Complaint Chief Complaint  Patient presents with  . Shortness of Breath  . Chest Pain  . Cough  . Congestion    HPI Raoul Ciano is a 67 y.o. male.  The history is provided by the patient and a relative.  Shortness of Breath  This is a recurrent problem. The average episode lasts 3 days. The problem occurs continuously.The current episode started more than 2 days ago. The problem has been gradually worsening. Associated symptoms include cough and wheezing. Pertinent negatives include no leg swelling. The problem's precipitants include smoke. He has tried nothing for the symptoms. The treatment provided no relief. He has had prior hospitalizations. He has had prior ED visits. Associated medical issues include COPD and pneumonia.  Cough  This is a recurrent problem. The current episode started more than 2 days ago. The problem occurs constantly. The problem has not changed since onset.The cough is productive of purulent sputum. There has been no fever. Associated symptoms include wheezing. Pertinent negatives include no sweats and no weight loss. His past medical history is significant for pneumonia and COPD.    History reviewed. No pertinent past medical history.  There are no active problems to display for this patient.   History reviewed. No pertinent surgical history.      Home Medications    Prior to Admission medications   Medication Sig Start Date End Date Taking? Authorizing Provider  aspirin (ASPIRIN 81) 81 MG EC tablet Take 81 mg by mouth daily. 06/01/12  Yes [provider]  atorvastatin (LIPITOR) 40 MG tablet Take 40 mg by mouth at bedtime.   Yes [provider]  azathioprine (IMURAN) 100 MG tablet Take 200 mg by mouth at bedtime. 12/30/13  Yes [provider]  azelastine (ASTELIN) 0.1 %  nasal spray Place 2 sprays into the nose 2 (two) times daily.   Yes [provider]  baclofen (LIORESAL) 10 MG tablet Take 10 mg by mouth 3 (three) times daily. 01/15/18  Yes [provider]  benzonatate (TESSALON) 200 MG capsule Take 1 capsule by mouth as needed for cough.   Yes [provider]  buPROPion (WELLBUTRIN SR) 150 MG 12 hr tablet Take 150 mg by mouth 2 (two) times daily.   Yes [provider]  Calcium Carb-Ergocalciferol 500-200 MG-UNIT TABS Take 1 tablet by mouth daily.   Yes [provider]  fluticasone (FLONASE) 50 MCG/ACT nasal spray Place 1 spray into the nose 2 (two) times daily.   Yes [provider]  guaiFENesin-dextromethorphan (ROBITUSSIN DM) 100-10 MG/5ML syrup Take 10 mLs by mouth 2 (two) times daily as needed for cough.  07/31/17  Yes [provider]  HYDROcodone-acetaminophen (NORCO/VICODIN) 5-325 MG tablet Take 1 tablet by mouth 3 (three) times daily as needed. 01/15/18  Yes [provider]  ipratropium (ATROVENT) 0.03 % nasal spray Place 1 spray into both nostrils every 12 (twelve) hours.   Yes [provider]  ipratropium-albuterol (DUONEB) 0.5-2.5 (3) MG/3ML SOLN Inhale 3 mLs into the lungs every 6 (six) hours. 10/21/16  Yes [provider]  levothyroxine (SYNTHROID, LEVOTHROID) 175 MCG tablet Take 175 mcg by mouth daily before breakfast.   Yes [provider]  mirtazapine (REMERON) 15 MG tablet Take 30 mg by mouth at bedtime.   Yes [provider]  montelukast (SINGULAIR) 10 MG tablet  Take 10 mg by mouth at bedtime.   Yes [provider]  morphine (MS CONTIN) 15 MG 12 hr tablet Take 15 mg by mouth at bedtime. 01/15/18  Yes [provider]  Multiple Vitamins-Minerals (ICAPS AREDS 2 PO) Take 1 tablet by mouth 2 (two) times daily.   Yes [provider]  omeprazole (PRILOSEC) 40 MG capsule Take 40 mg by mouth 2 (two) times daily. 10/21/16  Yes  [provider]  ondansetron (ZOFRAN) 4 MG tablet Take 4 mg by mouth every 6 (six) hours as needed for nausea or vomiting.  12/30/13  Yes [provider]  pregabalin (LYRICA) 50 MG capsule Take 50 mg by mouth at bedtime.   Yes [provider]  pyridostigmine (MESTINON) 60 MG tablet Take 60 mg by mouth 4 (four) times daily.  10/21/16  Yes [provider]  rivaroxaban (XARELTO) 20 MG TABS tablet Take 20 mg by mouth daily. 11/09/16  Yes [provider]  Tiotropium Bromide-Olodaterol (STIOLTO RESPIMAT) 2.5-2.5 MCG/ACT AERS Inhale 2 puffs into the lungs daily.   Yes [provider]  venlafaxine (EFFEXOR) 75 MG tablet Take 225 mg by mouth daily.   Yes [provider]  vitamin B-12 (CYANOCOBALAMIN) 1000 MCG tablet Take 1,000 mcg by mouth daily.   Yes [provider]    Family History History reviewed. No pertinent family history.  Social History Social History   Tobacco Use  . Smoking status: Current Every Day Smoker    Packs/day: 0.50    Types: Cigarettes  Substance Use Topics  . Alcohol use: Yes    Comment: occasional  . Drug use: Never     Allergies   Hydromorphone and Immune globulin   Review of Systems Review of Systems  Constitutional: Negative for weight loss.  Respiratory: Positive for cough and wheezing.   Cardiovascular: Negative for leg swelling.  All other systems reviewed and are negative.    Physical Exam Updated Vital Signs BP 111/71   Pulse 79   Temp 98.2 F (36.8 C) (Oral)   Resp (!) 23   Ht 6\' 1"  (1.854 m)   Wt 76.2 kg   SpO2 97%   BMI 22.16 kg/m   Physical Exam  Constitutional: He appears well-developed and well-nourished.  HENT:  Head: Normocephalic and atraumatic.  Mouth/Throat: No oropharyngeal exudate.  Eyes: Pupils are equal, round, and reactive to light. Conjunctivae are normal.  Neck: Normal range of motion. Neck supple. No JVD present.  Cardiovascular: Normal rate,  regular rhythm, normal heart sounds and intact distal pulses.  Pulmonary/Chest: No stridor. Tachypnea noted. He has wheezes. He has no rales.  Abdominal: Soft. Bowel sounds are normal. He exhibits no mass. There is no tenderness. There is no rebound and no guarding.  Musculoskeletal: Normal range of motion. He exhibits no edema or tenderness.  Neurological: He is alert. He displays normal reflexes.  Skin: Skin is warm and dry. Capillary refill takes less than 2 seconds. He is not diaphoretic.  Psychiatric: He has a normal mood and affect.     ED Treatments / Results  Labs (all labs ordered are listed, but only abnormal results are displayed) Results for orders placed or performed during the hospital encounter of 02/06/18  CBC with Differential  Result Value Ref Range   WBC 13.3 (H) 4.0 - 10.5 K/uL   RBC 4.30 4.22 - 5.81 MIL/uL   Hemoglobin 13.8 13.0 - 17.0 g/dL   HCT 16.1 09.6 - 04.5 %   MCV 96.3  78.0 - 100.0 fL   MCH 32.1 26.0 - 34.0 pg   MCHC 33.3 30.0 - 36.0 g/dL   RDW 74.1 28.7 - 86.7 %   Platelets 188 150 - 400 K/uL   Neutrophils Relative % 87 %   Neutro Abs 11.6 (H) 1.7 - 7.7 K/uL   Lymphocytes Relative 6 %   Lymphs Abs 0.7 0.7 - 4.0 K/uL   Monocytes Relative 5 %   Monocytes Absolute 0.7 0.1 - 1.0 K/uL   Eosinophils Relative 1 %   Eosinophils Absolute 0.1 0.0 - 0.7 K/uL   Basophils Relative 0 %   Basophils Absolute 0.0 0.0 - 0.1 K/uL   Immature Granulocytes 1 %   Abs Immature Granulocytes 0.1 0.0 - 0.1 K/uL  Basic metabolic panel  Result Value Ref Range   Sodium 138 135 - 145 mmol/L   Potassium 4.3 3.5 - 5.1 mmol/L   Chloride 105 98 - 111 mmol/L   CO2 21 (L) 22 - 32 mmol/L   Glucose, Bld 105 (H) 70 - 99 mg/dL   BUN 15 8 - 23 mg/dL   Creatinine, Ser 6.72 0.61 - 1.24 mg/dL   Calcium 8.5 (L) 8.9 - 10.3 mg/dL   GFR calc non Af Amer >60 >60 mL/min   GFR calc Af Amer >60 >60 mL/min   Anion gap 12 5 - 15  Brain natriuretic peptide  Result Value Ref Range   B  Natriuretic Peptide 89.0 0.0 - 100.0 pg/mL  I-stat troponin, ED  Result Value Ref Range   Troponin i, poc 0.02 0.00 - 0.08 ng/mL   Comment 3           Dg Chest 2 View  Result Date: 02/06/2018 CLINICAL DATA:  Shortness of breath cough and congestion for 3 days. EXAM: CHEST - 2 VIEW COMPARISON:  None. FINDINGS: Left subclavian injectable port terminates at the expected location of the cavoatrial junction. Cardiomediastinal silhouette is normal. Mediastinal contours appear intact. There is no evidence of pleural effusion or pneumothorax. Patchy interstitial and alveolar opacities in bilateral lungs, with particular prominence in the right upper lobe, right middle lobe and left lower lobe. Possible pulmonary nodule in the right mid lung field. Osseous structures are without acute abnormality. Soft tissues are grossly normal. IMPRESSION: Patchy interstitial and alveolar opacities in bilateral lungs. Findings may represent multifocal pneumonia or developing asymmetric pulmonary edema. Possible pulmonary nodule in the right mid lung field. Electronically Signed   By: Ted Mcalpine M.D.   On: 02/06/2018 21:12    EKG EKG Interpretation  Date/Time:  Friday February 06 2018 20:13:14 EDT Ventricular Rate:  97 PR Interval:  128 QRS Duration: 82 QT Interval:  362 QTC Calculation: 459 R Axis:   40 Text Interpretation:  Normal sinus rhythm Confirmed by Nicanor Alcon, Christiano Blandon ([id number]) on 02/06/2018 11:05:59 PM   Radiology Dg Chest 2 View  Result Date: 02/06/2018 CLINICAL DATA:  Shortness of breath cough and congestion for 3 days. EXAM: CHEST - 2 VIEW COMPARISON:  None. FINDINGS: Left subclavian injectable port terminates at the expected location of the cavoatrial junction. Cardiomediastinal silhouette is normal. Mediastinal contours appear intact. There is no evidence of pleural effusion or pneumothorax. Patchy interstitial and alveolar opacities in bilateral lungs, with particular prominence in the right upper  lobe, right middle lobe and left lower lobe. Possible pulmonary nodule in the right mid lung field. Osseous structures are without acute abnormality. Soft tissues are grossly normal. IMPRESSION: Patchy interstitial and alveolar opacities in bilateral lungs. Findings  may represent multifocal pneumonia or developing asymmetric pulmonary edema. Possible pulmonary nodule in the right mid lung field. Electronically Signed   By: Ted Mcalpine M.D.   On: 02/06/2018 21:12    Procedures Procedures (including critical care time)  Medications Ordered in ED Medications  albuterol (PROVENTIL) (2.5 MG/3ML) 0.083% nebulizer solution 5 mg (has no administration in time range)  cefTRIAXone (ROCEPHIN) 1 g in sodium chloride 0.9 % 100 mL IVPB (has no administration in time range)  azithromycin (ZITHROMAX) 500 mg in sodium chloride 0.9 % 250 mL IVPB (has no administration in time range)     Final Clinical Impressions(s) / ED Diagnoses   Final diagnoses:  Community acquired pneumonia, unspecified laterality  COPD exacerbation (HCC)   Will admit to medicine based on CURB 65 score.     Jossue Rubenstein, MD 02/06/18 1610    Cy Blamer, MD 02/06/18 2332

## 2018-02-07 ENCOUNTER — Other Ambulatory Visit: Payer: Self-pay

## 2018-02-07 ENCOUNTER — Encounter (HOSPITAL_COMMUNITY): Payer: Self-pay | Admitting: Internal Medicine

## 2018-02-07 DIAGNOSIS — A419 Sepsis, unspecified organism: Secondary | ICD-10-CM | POA: Diagnosis present

## 2018-02-07 DIAGNOSIS — I2699 Other pulmonary embolism without acute cor pulmonale: Secondary | ICD-10-CM | POA: Diagnosis present

## 2018-02-07 DIAGNOSIS — K219 Gastro-esophageal reflux disease without esophagitis: Secondary | ICD-10-CM | POA: Diagnosis present

## 2018-02-07 DIAGNOSIS — Z720 Tobacco use: Secondary | ICD-10-CM | POA: Diagnosis present

## 2018-02-07 DIAGNOSIS — I2510 Atherosclerotic heart disease of native coronary artery without angina pectoris: Secondary | ICD-10-CM | POA: Diagnosis present

## 2018-02-07 DIAGNOSIS — G7000 Myasthenia gravis without (acute) exacerbation: Secondary | ICD-10-CM | POA: Diagnosis present

## 2018-02-07 DIAGNOSIS — J189 Pneumonia, unspecified organism: Secondary | ICD-10-CM

## 2018-02-07 DIAGNOSIS — E039 Hypothyroidism, unspecified: Secondary | ICD-10-CM | POA: Diagnosis present

## 2018-02-07 DIAGNOSIS — J449 Chronic obstructive pulmonary disease, unspecified: Secondary | ICD-10-CM | POA: Diagnosis present

## 2018-02-07 DIAGNOSIS — F418 Other specified anxiety disorders: Secondary | ICD-10-CM | POA: Diagnosis present

## 2018-02-07 LAB — RESPIRATORY PANEL BY PCR
ADENOVIRUS-RVPPCR: NOT DETECTED
Bordetella pertussis: NOT DETECTED
CORONAVIRUS 229E-RVPPCR: NOT DETECTED
CORONAVIRUS NL63-RVPPCR: NOT DETECTED
CORONAVIRUS OC43-RVPPCR: NOT DETECTED
Chlamydophila pneumoniae: NOT DETECTED
Coronavirus HKU1: NOT DETECTED
Influenza A: NOT DETECTED
Influenza B: NOT DETECTED
MYCOPLASMA PNEUMONIAE-RVPPCR: NOT DETECTED
Metapneumovirus: NOT DETECTED
PARAINFLUENZA VIRUS 1-RVPPCR: NOT DETECTED
Parainfluenza Virus 2: NOT DETECTED
Parainfluenza Virus 3: NOT DETECTED
Parainfluenza Virus 4: NOT DETECTED
Respiratory Syncytial Virus: NOT DETECTED
Rhinovirus / Enterovirus: NOT DETECTED

## 2018-02-07 LAB — PROCALCITONIN: PROCALCITONIN: 0.16 ng/mL

## 2018-02-07 LAB — STREP PNEUMONIAE URINARY ANTIGEN: STREP PNEUMO URINARY ANTIGEN: NEGATIVE

## 2018-02-07 LAB — HIV ANTIBODY (ROUTINE TESTING W REFLEX): HIV Screen 4th Generation wRfx: NONREACTIVE

## 2018-02-07 LAB — LACTIC ACID, PLASMA
Lactic Acid, Venous: 1.4 mmol/L (ref 0.5–1.9)
Lactic Acid, Venous: 1.9 mmol/L (ref 0.5–1.9)

## 2018-02-07 MED ORDER — SODIUM CHLORIDE 0.9 % IV SOLN: 1.0000 g | Freq: Three times a day (TID) | INTRAVENOUS | Status: DC

## 2018-02-07 MED ORDER — GUAIFENESIN-DM 100-10 MG/5ML PO SYRP: 10.0000 mL | ORAL_SOLUTION | Freq: Two times a day (BID) | ORAL | Status: DC | PRN

## 2018-02-07 MED ORDER — SODIUM CHLORIDE 0.9 % IV SOLN
1.0000 g | Freq: Three times a day (TID) | INTRAVENOUS | Status: DC
  Administered 2018-02-07 – 2018-02-09 (×6): 1 g via INTRAVENOUS
  Filled 2018-02-07 (×8): qty 1

## 2018-02-07 MED ORDER — ASPIRIN EC 81 MG PO TBEC
81.0000 mg | DELAYED_RELEASE_TABLET | Freq: Every day | ORAL | Status: DC
  Administered 2018-02-07 – 2018-02-12 (×6): 81 mg via ORAL
  Filled 2018-02-07 (×7): qty 1

## 2018-02-07 MED ORDER — SODIUM CHLORIDE 0.9% FLUSH: 10.0000 mL | INTRAVENOUS | Status: DC | PRN

## 2018-02-07 MED ORDER — AZATHIOPRINE 50 MG PO TABS
200.0000 mg | ORAL_TABLET | Freq: Every day | ORAL | Status: DC
  Administered 2018-02-07 – 2018-02-10 (×4): 200 mg via ORAL
  Filled 2018-02-07 (×5): qty 4

## 2018-02-07 MED ORDER — PANTOPRAZOLE SODIUM 40 MG PO TBEC
40.0000 mg | DELAYED_RELEASE_TABLET | Freq: Every day | ORAL | Status: DC
  Administered 2018-02-07 – 2018-02-12 (×6): 40 mg via ORAL
  Filled 2018-02-07 (×8): qty 1

## 2018-02-07 MED ORDER — BENZONATATE 100 MG PO CAPS
200.0000 mg | ORAL_CAPSULE | ORAL | Status: DC | PRN
  Administered 2018-02-08 (×2): 200 mg via ORAL
  Filled 2018-02-07 (×2): qty 2

## 2018-02-07 MED ORDER — PROSIGHT PO TABS
1.0000 | ORAL_TABLET | Freq: Two times a day (BID) | ORAL | Status: DC
  Administered 2018-02-07 – 2018-02-12 (×12): 1 via ORAL
  Filled 2018-02-07 (×13): qty 1

## 2018-02-07 MED ORDER — HYDRALAZINE HCL 20 MG/ML IJ SOLN: 5.0000 mg | INTRAMUSCULAR | Status: DC | PRN

## 2018-02-07 MED ORDER — PYRIDOSTIGMINE BROMIDE 60 MG PO TABS
60.0000 mg | ORAL_TABLET | Freq: Four times a day (QID) | ORAL | Status: DC
  Administered 2018-02-07 – 2018-02-09 (×10): 60 mg via ORAL
  Filled 2018-02-07 (×14): qty 1

## 2018-02-07 MED ORDER — TIOTROPIUM BROMIDE MONOHYDRATE 18 MCG IN CAPS
18.0000 ug | ORAL_CAPSULE | Freq: Every day | RESPIRATORY_TRACT | Status: DC
  Administered 2018-02-07 – 2018-02-13 (×6): 18 ug via RESPIRATORY_TRACT
  Filled 2018-02-07 (×2): qty 5

## 2018-02-07 MED ORDER — NICOTINE 21 MG/24HR TD PT24
21.0000 mg | MEDICATED_PATCH | Freq: Every day | TRANSDERMAL | Status: DC
  Administered 2018-02-07 – 2018-02-13 (×7): 21 mg via TRANSDERMAL
  Filled 2018-02-07 (×7): qty 1

## 2018-02-07 MED ORDER — MIRTAZAPINE 15 MG PO TABS
30.0000 mg | ORAL_TABLET | Freq: Every day | ORAL | Status: DC
  Administered 2018-02-07 – 2018-02-12 (×6): 30 mg via ORAL
  Filled 2018-02-07: qty 2
  Filled 2018-02-07 (×2): qty 1
  Filled 2018-02-07 (×3): qty 2
  Filled 2018-02-07: qty 4
  Filled 2018-02-07: qty 2
  Filled 2018-02-07: qty 1

## 2018-02-07 MED ORDER — MORPHINE SULFATE ER 15 MG PO TBCR
15.0000 mg | EXTENDED_RELEASE_TABLET | Freq: Every day | ORAL | Status: DC
  Administered 2018-02-07 – 2018-02-12 (×6): 15 mg via ORAL
  Filled 2018-02-07 (×7): qty 1

## 2018-02-07 MED ORDER — BUPROPION HCL ER (SR) 150 MG PO TB12
150.0000 mg | ORAL_TABLET | Freq: Two times a day (BID) | ORAL | Status: DC
  Administered 2018-02-07 – 2018-02-12 (×12): 150 mg via ORAL
  Filled 2018-02-07 (×13): qty 1

## 2018-02-07 MED ORDER — ALBUTEROL SULFATE (2.5 MG/3ML) 0.083% IN NEBU
2.5000 mg | INHALATION_SOLUTION | RESPIRATORY_TRACT | Status: DC | PRN
  Administered 2018-02-09: 2.5 mg via RESPIRATORY_TRACT
  Filled 2018-02-07: qty 3

## 2018-02-07 MED ORDER — VENLAFAXINE HCL 75 MG PO TABS
225.0000 mg | ORAL_TABLET | Freq: Every day | ORAL | Status: DC
  Administered 2018-02-07 – 2018-02-12 (×6): 225 mg via ORAL
  Filled 2018-02-07 (×7): qty 3

## 2018-02-07 MED ORDER — VANCOMYCIN HCL IN DEXTROSE 1-5 GM/200ML-% IV SOLN
1000.0000 mg | INTRAVENOUS | Status: DC
  Administered 2018-02-08 – 2018-02-09 (×2): 1000 mg via INTRAVENOUS
  Filled 2018-02-07 (×2): qty 200

## 2018-02-07 MED ORDER — IPRATROPIUM-ALBUTEROL 0.5-2.5 (3) MG/3ML IN SOLN
3.0000 mL | Freq: Four times a day (QID) | RESPIRATORY_TRACT | Status: DC
  Administered 2018-02-07 – 2018-02-13 (×20): 3 mL via RESPIRATORY_TRACT
  Filled 2018-02-07 (×22): qty 3

## 2018-02-07 MED ORDER — PREGABALIN 50 MG PO CAPS
50.0000 mg | ORAL_CAPSULE | Freq: Every day | ORAL | Status: DC
  Administered 2018-02-07 – 2018-02-12 (×6): 50 mg via ORAL
  Filled 2018-02-07 (×2): qty 1
  Filled 2018-02-07: qty 2
  Filled 2018-02-07: qty 1
  Filled 2018-02-07 (×2): qty 2
  Filled 2018-02-07: qty 1

## 2018-02-07 MED ORDER — AZELASTINE HCL 0.1 % NA SOLN
2.0000 | Freq: Two times a day (BID) | NASAL | Status: DC
  Administered 2018-02-07 – 2018-02-13 (×11): 2 via NASAL
  Filled 2018-02-07 (×2): qty 30

## 2018-02-07 MED ORDER — CALCIUM CARBONATE-VITAMIN D 500-200 MG-UNIT PO TABS
1.0000 | ORAL_TABLET | Freq: Every day | ORAL | Status: DC
  Administered 2018-02-07 – 2018-02-12 (×6): 1 via ORAL
  Filled 2018-02-07 (×7): qty 1

## 2018-02-07 MED ORDER — HYDROCODONE-ACETAMINOPHEN 5-325 MG PO TABS
1.0000 | ORAL_TABLET | Freq: Three times a day (TID) | ORAL | Status: DC | PRN
  Administered 2018-02-07 – 2018-02-12 (×8): 1 via ORAL
  Filled 2018-02-07 (×8): qty 1

## 2018-02-07 MED ORDER — SODIUM CHLORIDE 0.9 % IV BOLUS
2000.0000 mL | Freq: Once | INTRAVENOUS | Status: AC
  Administered 2018-02-07: 2000 mL via INTRAVENOUS

## 2018-02-07 MED ORDER — RIVAROXABAN 20 MG PO TABS
20.0000 mg | ORAL_TABLET | Freq: Every day | ORAL | Status: DC
  Administered 2018-02-07 – 2018-02-09 (×3): 20 mg via ORAL
  Filled 2018-02-07 (×3): qty 1

## 2018-02-07 MED ORDER — LEVOTHYROXINE SODIUM 75 MCG PO TABS
175.0000 ug | ORAL_TABLET | Freq: Every day | ORAL | Status: DC
  Administered 2018-02-07 – 2018-02-12 (×5): 175 ug via ORAL
  Filled 2018-02-07 (×6): qty 1

## 2018-02-07 MED ORDER — DEXTROSE 5 % IV SOLN
250.0000 mg | INTRAVENOUS | Status: DC
  Administered 2018-02-08 – 2018-02-09 (×2): 250 mg via INTRAVENOUS
  Filled 2018-02-07 (×2): qty 250

## 2018-02-07 MED ORDER — ACETAMINOPHEN 325 MG PO TABS: 650.0000 mg | ORAL_TABLET | Freq: Four times a day (QID) | ORAL | Status: DC | PRN

## 2018-02-07 MED ORDER — SODIUM CHLORIDE 0.9 % IV SOLN
INTRAVENOUS | Status: DC
  Administered 2018-02-07 – 2018-02-12 (×9): via INTRAVENOUS

## 2018-02-07 MED ORDER — INFLUENZA VAC SPLIT HIGH-DOSE 0.5 ML IM SUSY
0.5000 mL | PREFILLED_SYRINGE | INTRAMUSCULAR | Status: DC
  Filled 2018-02-07: qty 0.5

## 2018-02-07 MED ORDER — BACLOFEN 10 MG PO TABS
10.0000 mg | ORAL_TABLET | Freq: Three times a day (TID) | ORAL | Status: DC
  Administered 2018-02-07 – 2018-02-12 (×19): 10 mg via ORAL
  Filled 2018-02-07 (×23): qty 1

## 2018-02-07 MED ORDER — SODIUM CHLORIDE 0.9% FLUSH
10.0000 mL | Freq: Two times a day (BID) | INTRAVENOUS | Status: DC
  Administered 2018-02-08 – 2018-02-13 (×5): 10 mL

## 2018-02-07 MED ORDER — ATORVASTATIN CALCIUM 40 MG PO TABS
40.0000 mg | ORAL_TABLET | Freq: Every day | ORAL | Status: DC
  Administered 2018-02-07 – 2018-02-12 (×6): 40 mg via ORAL
  Filled 2018-02-07 (×6): qty 1

## 2018-02-07 MED ORDER — VITAMIN B-12 1000 MCG PO TABS
1000.0000 ug | ORAL_TABLET | Freq: Every day | ORAL | Status: DC
  Administered 2018-02-07 – 2018-02-12 (×6): 1000 ug via ORAL
  Filled 2018-02-07 (×8): qty 1

## 2018-02-07 MED ORDER — MONTELUKAST SODIUM 10 MG PO TABS
10.0000 mg | ORAL_TABLET | Freq: Every day | ORAL | Status: DC
  Administered 2018-02-07 – 2018-02-19 (×12): 10 mg via ORAL
  Filled 2018-02-07 (×13): qty 1

## 2018-02-07 MED ORDER — FLUTICASONE PROPIONATE 50 MCG/ACT NA SUSP
1.0000 | Freq: Two times a day (BID) | NASAL | Status: DC
  Administered 2018-02-07 – 2018-02-13 (×11): 1 via NASAL
  Filled 2018-02-07: qty 16

## 2018-02-07 MED ORDER — ONDANSETRON HCL 4 MG PO TABS: 4.0000 mg | ORAL_TABLET | Freq: Four times a day (QID) | ORAL | Status: DC | PRN

## 2018-02-07 MED ORDER — ZOLPIDEM TARTRATE 5 MG PO TABS: 5.0000 mg | ORAL_TABLET | Freq: Every evening | ORAL | Status: DC | PRN

## 2018-02-07 MED ORDER — VANCOMYCIN HCL 10 G IV SOLR
1500.0000 mg | Freq: Once | INTRAVENOUS | Status: AC
  Administered 2018-02-07: 1500 mg via INTRAVENOUS
  Filled 2018-02-07: qty 1500

## 2018-02-07 NOTE — Plan of Care (Signed)
  Problem: Clinical Measurements: Goal: Respiratory complications will improve Outcome: Progressing   Problem: Coping: Goal: Level of anxiety will decrease Outcome: Not Progressing   

## 2018-02-07 NOTE — Progress Notes (Signed)
PROGRESS NOTE    Curtis Preston  WGN:[id number] DOB: 08-25-1950 DOA: 02/06/2018 PCP: No primary care provider on file.   Brief Narrative:  Curtis Preston is a 67 y.o. male with medical history significant of COPD, tobacco abuse, GERD, hypothyroidism, depression, mild venous gravis, CAD, stent placement, PE on Xarelto, who presents with cough and shortness breath.  Patient found to have multifocal lobar pneumonia on chest x-ray and is on Imuran for immunosuppression due to his myasthenia gravis.  Given his condition on presentation it was felt that he would benefit from admission to the hospital.  Since being placed in observation status patient reports increasing shortness of breath with sepsis parameters have improved   Assessment & Plan:   Principal Problem:   Sepsis (HCC) Active Problems:   Myasthenia gravis (HCC)   Lobar pneumonia (HCC)   CAD (coronary artery disease)   COPD (chronic obstructive pulmonary disease) (HCC)   Depression with anxiety   GERD (gastroesophageal reflux disease)   Hypothyroidism   PE (pulmonary thromboembolism) (HCC)   Tobacco abuse   Sepsis due to lobar pneumonia [facility]: Lambert Rhonda W): Patient meets criteria for sepsis with leukocytosis and tachypnea.  Currently hemodynamically stable.  Due to immunosuppressed status, patient is receiving antibiotics with broad coverage.  -Continue telemetry bed and observation status - IV Vancomycin and cefepime, azithromycin (patient received 1 dose of Rocephin in ED) - prn Albuterol Nebs, DuoNeb prn for SOB - Singulair and spirava - Urine legionella and S. pneumococcal antigen are pending - Follow up blood culture x2, sputum culture and respiratory virus panel are pending - ProCalcitonin level less than 1,  lactic acid 1.4 indictating improvement in sepsis parameters - IVF:  100 mL per hour of NS for the next 24 hours -Repeat chest x-ray in a.m.  COPD (chronic obstructive pulmonary disease) (HCC): no wheezing or  rhochi, no acute exacerbation -Breathing treatment as above  CAD (coronary artery disease): s/p of stent. No CP -ASA and lipitor  HLD: -Lipitor  GERD: -Protonix  Hypothyroidism:  -Continue home Synthroid  Myasthenia gravis: -on Imuran and pyridostigmine  PE (pulmonary thromboembolism) (HCC): -on Xarelto  Tobacco abuse -Nicotine patch  Depression with anxiety: No SI or HI -Continue home medications.   DVT ppx: Xarelto Code Status: Full code Family Communication: None at bed side.  Patient retains capacity  disposition Plan:  Anticipate discharge back to previous home environment Consults called:  none Admission status: Obs / tele    Antimicrobials: Azithromycin, cefepime, vancomycin  Subjective:  Very pleasant gentleman with worsening symptoms of increasing shortness of breath since admission to the hospital.  He is to have a dry cough..  Denies any wheezing.  Objective: Vitals:   02/07/18 0812 02/07/18 0831 02/07/18 1021 02/07/18 1256  BP: 133/71  117/70 117/62  Pulse: 71  69 73  Resp:    18  Temp:      TempSrc:      SpO2:  97%  95%  Weight:      Height:        Intake/Output Summary (Last 24 hours) at 02/07/2018 1306 Last data filed at 02/07/2018 1041 Gross per 24 hour  Intake 3911.58 ml  Output 600 ml  Net 3311.58 ml   Filed Weights   02/06/18 2016 02/07/18 0117  Weight: 76.2 kg 74.2 kg    Examination:  General exam: Appears calm and comfortable  Respiratory system: Clear to auscultation.  Mild use of accessory muscles of respiration.  Coarse breath sounds. Cardiovascular system: S1 & S2  heard, RRR. No JVD, murmurs, rubs, gallops or clicks. No pedal edema. Gastrointestinal system: Abdomen is nondistended, soft and nontender. No organomegaly or masses felt. Normal bowel sounds heard. Central nervous system: Alert and oriented. No focal neurological deficits. Extremities: Symmetric 5 x 5 power. Skin: No rashes, lesions or ulcers,  pale Psychiatry: Judgement and insight appear normal. Mood & affect appropriate.     Data Reviewed: I have personally reviewed following labs and imaging studies  CBC: Recent Labs  Lab 02/06/18 2028  WBC 13.3*  NEUTROABS 11.6*  HGB 13.8  HCT 41.4  MCV 96.3  PLT 188   Basic Metabolic Panel: Recent Labs  Lab 02/06/18 2028  NA 138  K 4.3  CL 105  CO2 21*  GLUCOSE 105*  BUN 15  CREATININE 1.17  CALCIUM 8.5*   GFR: Estimated Creatinine Clearance: 62.5 mL/min (by C-G formula based on SCr of 1.17 mg/dL). Sepsis Labs: Recent Labs  Lab 02/07/18 0034 02/07/18 0657  PROCALCITON 0.16  --   LATICACIDVEN 1.4 1.9    No results found for this or any previous visit (from the past 240 hour(s)).       Radiology Studies: Dg Chest 2 View  Result Date: 02/06/2018 CLINICAL DATA:  Shortness of breath cough and congestion for 3 days. EXAM: CHEST - 2 VIEW COMPARISON:  None. FINDINGS: Left subclavian injectable port terminates at the expected location of the cavoatrial junction. Cardiomediastinal silhouette is normal. Mediastinal contours appear intact. There is no evidence of pleural effusion or pneumothorax. Patchy interstitial and alveolar opacities in bilateral lungs, with particular prominence in the right upper lobe, right middle lobe and left lower lobe. Possible pulmonary nodule in the right mid lung field. Osseous structures are without acute abnormality. Soft tissues are grossly normal. IMPRESSION: Patchy interstitial and alveolar opacities in bilateral lungs. Findings may represent multifocal pneumonia or developing asymmetric pulmonary edema. Possible pulmonary nodule in the right mid lung field. Electronically Signed   By: Ted Mcalpine M.D.   On: 02/06/2018 21:12        Scheduled Meds: . aspirin EC  81 mg Oral Daily  . atorvastatin  40 mg Oral QHS  . azaTHIOprine  200 mg Oral QHS  . azelastine  2 spray Each Nare BID  . baclofen  10 mg Oral TID  . buPROPion  150  mg Oral BID  . calcium-vitamin D  1 tablet Oral Daily  . fluticasone  1 spray Each Nare BID  . [START ON 02/08/2018] Influenza vac split quadrivalent PF  0.5 mL Intramuscular Tomorrow-1000  . ipratropium-albuterol  3 mL Inhalation Q6H  . levothyroxine  175 mcg Oral QAC breakfast  . mirtazapine  30 mg Oral QHS  . montelukast  10 mg Oral QHS  . morphine  15 mg Oral QHS  . multivitamin  1 tablet Oral BID  . nicotine  21 mg Transdermal Daily  . pantoprazole  40 mg Oral Daily  . pregabalin  50 mg Oral QHS  . pyridostigmine  60 mg Oral QID  . rivaroxaban  20 mg Oral Daily  . sodium chloride flush  10-40 mL Intracatheter Q12H  . tiotropium  18 mcg Inhalation Daily  . venlafaxine  225 mg Oral Daily  . vitamin B-12  1,000 mcg Oral Daily   Continuous Infusions: . sodium chloride 100 mL/hr at 02/07/18 0357  . [START ON 02/08/2018] azithromycin    . ceFEPime (MAXIPIME) IV 1 g (02/07/18 1258)  . [START ON 02/08/2018] vancomycin  LOS: 0 days    Time spent: 45 minutes    Lahoma Crocker, MD FACP [facility] Pager [phone]  If 7PM-7AM, please contact night-coverage [email or web address] Password [email or web address] 02/07/2018, 1:06 PM

## 2018-02-07 NOTE — Progress Notes (Signed)
Pt reports 4/10 chest pian  Provided PRN norco  Vital signs documented  EKG completed placed in chart  Pt on 2L nasal cannula  Paged MD

## 2018-02-07 NOTE — Progress Notes (Signed)
Second page to MD regarding pt chest pain

## 2018-02-07 NOTE — Progress Notes (Signed)
Pharmacy Antibiotic Note  Denahi Ladzinski is a 67 y.o. male admitted on 02/06/2018 with pneumonia.  Pharmacy has been consulted for vancomycin dosing.  Plan: Vancomycin 1500 mg Iv x 1 then 1gm IV q24 hours F/u renal function, cultures and clinical course  Height: 6\' 1"  (185.4 cm) Weight: 168 lb (76.2 kg) IBW/kg (Calculated) : 79.9  Temp (24hrs), Avg:98.2 F (36.8 C), Min:98.2 F (36.8 C), Max:98.2 F (36.8 C)  Recent Labs  Lab 02/06/18 2028  WBC 13.3*  CREATININE 1.17    Estimated Creatinine Clearance: 64.2 mL/min (by C-G formula based on SCr of 1.17 mg/dL).    Allergies  Allergen Reactions  . Hydromorphone Other (See Comments)    hallucinations   . Immune Globulin Other (See Comments)     Bilateral pulmonary embolism      Antimicrobials this admission: vanc 9/7 >>  cetriaxone 9/7 x 1 Azithromycin 9/7>> Cefepime  9/7>>  Thank you for allowing pharmacy to be a part of this patient's care.  Talbert Cage Poteet 02/07/2018 1:00 AM

## 2018-02-08 ENCOUNTER — Observation Stay (HOSPITAL_COMMUNITY): Payer: Medicare HMO

## 2018-02-08 ENCOUNTER — Encounter (HOSPITAL_COMMUNITY): Payer: Self-pay | Admitting: Surgery

## 2018-02-08 DIAGNOSIS — K219 Gastro-esophageal reflux disease without esophagitis: Secondary | ICD-10-CM | POA: Diagnosis present

## 2018-02-08 DIAGNOSIS — J189 Pneumonia, unspecified organism: Secondary | ICD-10-CM | POA: Diagnosis not present

## 2018-02-08 DIAGNOSIS — E440 Moderate protein-calorie malnutrition: Secondary | ICD-10-CM | POA: Diagnosis present

## 2018-02-08 DIAGNOSIS — J440 Chronic obstructive pulmonary disease with acute lower respiratory infection: Secondary | ICD-10-CM | POA: Diagnosis present

## 2018-02-08 DIAGNOSIS — A403 Sepsis due to Streptococcus pneumoniae: Secondary | ICD-10-CM | POA: Diagnosis present

## 2018-02-08 DIAGNOSIS — J984 Other disorders of lung: Secondary | ICD-10-CM | POA: Diagnosis present

## 2018-02-08 DIAGNOSIS — E871 Hypo-osmolality and hyponatremia: Secondary | ICD-10-CM | POA: Diagnosis not present

## 2018-02-08 DIAGNOSIS — G7000 Myasthenia gravis without (acute) exacerbation: Secondary | ICD-10-CM | POA: Diagnosis not present

## 2018-02-08 DIAGNOSIS — K210 Gastro-esophageal reflux disease with esophagitis: Secondary | ICD-10-CM | POA: Diagnosis not present

## 2018-02-08 DIAGNOSIS — F28 Other psychotic disorder not due to a substance or known physiological condition: Secondary | ICD-10-CM | POA: Diagnosis not present

## 2018-02-08 DIAGNOSIS — J449 Chronic obstructive pulmonary disease, unspecified: Secondary | ICD-10-CM | POA: Diagnosis not present

## 2018-02-08 DIAGNOSIS — G9340 Encephalopathy, unspecified: Secondary | ICD-10-CM | POA: Diagnosis not present

## 2018-02-08 DIAGNOSIS — I2699 Other pulmonary embolism without acute cor pulmonale: Secondary | ICD-10-CM | POA: Diagnosis not present

## 2018-02-08 DIAGNOSIS — G9349 Other encephalopathy: Secondary | ICD-10-CM | POA: Diagnosis not present

## 2018-02-08 DIAGNOSIS — A219 Tularemia, unspecified: Secondary | ICD-10-CM | POA: Diagnosis present

## 2018-02-08 DIAGNOSIS — R0600 Dyspnea, unspecified: Secondary | ICD-10-CM | POA: Diagnosis not present

## 2018-02-08 DIAGNOSIS — R0902 Hypoxemia: Secondary | ICD-10-CM | POA: Diagnosis not present

## 2018-02-08 DIAGNOSIS — Z885 Allergy status to narcotic agent status: Secondary | ICD-10-CM | POA: Diagnosis not present

## 2018-02-08 DIAGNOSIS — I5030 Unspecified diastolic (congestive) heart failure: Secondary | ICD-10-CM | POA: Diagnosis not present

## 2018-02-08 DIAGNOSIS — R040 Epistaxis: Secondary | ICD-10-CM | POA: Diagnosis not present

## 2018-02-08 DIAGNOSIS — F419 Anxiety disorder, unspecified: Secondary | ICD-10-CM | POA: Diagnosis present

## 2018-02-08 DIAGNOSIS — Z888 Allergy status to other drugs, medicaments and biological substances status: Secondary | ICD-10-CM | POA: Diagnosis not present

## 2018-02-08 DIAGNOSIS — R0989 Other specified symptoms and signs involving the circulatory and respiratory systems: Secondary | ICD-10-CM | POA: Diagnosis not present

## 2018-02-08 DIAGNOSIS — R451 Restlessness and agitation: Secondary | ICD-10-CM | POA: Diagnosis not present

## 2018-02-08 DIAGNOSIS — R05 Cough: Secondary | ICD-10-CM | POA: Diagnosis not present

## 2018-02-08 DIAGNOSIS — R1310 Dysphagia, unspecified: Secondary | ICD-10-CM | POA: Diagnosis not present

## 2018-02-08 DIAGNOSIS — A021 Salmonella sepsis: Secondary | ICD-10-CM | POA: Diagnosis not present

## 2018-02-08 DIAGNOSIS — G7001 Myasthenia gravis with (acute) exacerbation: Secondary | ICD-10-CM | POA: Diagnosis present

## 2018-02-08 DIAGNOSIS — J439 Emphysema, unspecified: Secondary | ICD-10-CM | POA: Diagnosis not present

## 2018-02-08 DIAGNOSIS — Z95828 Presence of other vascular implants and grafts: Secondary | ICD-10-CM | POA: Diagnosis not present

## 2018-02-08 DIAGNOSIS — I5033 Acute on chronic diastolic (congestive) heart failure: Secondary | ICD-10-CM | POA: Diagnosis present

## 2018-02-08 DIAGNOSIS — R042 Hemoptysis: Secondary | ICD-10-CM | POA: Diagnosis present

## 2018-02-08 DIAGNOSIS — Z720 Tobacco use: Secondary | ICD-10-CM | POA: Diagnosis not present

## 2018-02-08 DIAGNOSIS — E039 Hypothyroidism, unspecified: Secondary | ICD-10-CM | POA: Diagnosis present

## 2018-02-08 DIAGNOSIS — J9601 Acute respiratory failure with hypoxia: Secondary | ICD-10-CM | POA: Diagnosis not present

## 2018-02-08 DIAGNOSIS — I4891 Unspecified atrial fibrillation: Secondary | ICD-10-CM | POA: Diagnosis not present

## 2018-02-08 DIAGNOSIS — T380X5A Adverse effect of glucocorticoids and synthetic analogues, initial encounter: Secondary | ICD-10-CM | POA: Diagnosis present

## 2018-02-08 DIAGNOSIS — J181 Lobar pneumonia, unspecified organism: Secondary | ICD-10-CM | POA: Diagnosis present

## 2018-02-08 DIAGNOSIS — R0603 Acute respiratory distress: Secondary | ICD-10-CM | POA: Diagnosis not present

## 2018-02-08 DIAGNOSIS — R0602 Shortness of breath: Secondary | ICD-10-CM | POA: Diagnosis present

## 2018-02-08 DIAGNOSIS — Z9911 Dependence on respirator [ventilator] status: Secondary | ICD-10-CM | POA: Diagnosis not present

## 2018-02-08 DIAGNOSIS — R202 Paresthesia of skin: Secondary | ICD-10-CM | POA: Diagnosis not present

## 2018-02-08 DIAGNOSIS — F17210 Nicotine dependence, cigarettes, uncomplicated: Secondary | ICD-10-CM | POA: Diagnosis not present

## 2018-02-08 DIAGNOSIS — I951 Orthostatic hypotension: Secondary | ICD-10-CM | POA: Diagnosis not present

## 2018-02-08 DIAGNOSIS — J9600 Acute respiratory failure, unspecified whether with hypoxia or hypercapnia: Secondary | ICD-10-CM | POA: Diagnosis not present

## 2018-02-08 DIAGNOSIS — M3500 Sicca syndrome, unspecified: Secondary | ICD-10-CM | POA: Diagnosis present

## 2018-02-08 DIAGNOSIS — F329 Major depressive disorder, single episode, unspecified: Secondary | ICD-10-CM | POA: Diagnosis present

## 2018-02-08 DIAGNOSIS — Z86711 Personal history of pulmonary embolism: Secondary | ICD-10-CM | POA: Diagnosis not present

## 2018-02-08 DIAGNOSIS — R634 Abnormal weight loss: Secondary | ICD-10-CM | POA: Diagnosis not present

## 2018-02-08 DIAGNOSIS — D849 Immunodeficiency, unspecified: Secondary | ICD-10-CM | POA: Diagnosis not present

## 2018-02-08 DIAGNOSIS — J849 Interstitial pulmonary disease, unspecified: Secondary | ICD-10-CM | POA: Diagnosis not present

## 2018-02-08 DIAGNOSIS — R5381 Other malaise: Secondary | ICD-10-CM | POA: Diagnosis not present

## 2018-02-08 DIAGNOSIS — R0981 Nasal congestion: Secondary | ICD-10-CM | POA: Diagnosis not present

## 2018-02-08 DIAGNOSIS — R918 Other nonspecific abnormal finding of lung field: Secondary | ICD-10-CM | POA: Diagnosis not present

## 2018-02-08 DIAGNOSIS — R231 Pallor: Secondary | ICD-10-CM | POA: Diagnosis not present

## 2018-02-08 DIAGNOSIS — Z6820 Body mass index (BMI) 20.0-20.9, adult: Secondary | ICD-10-CM | POA: Diagnosis not present

## 2018-02-08 DIAGNOSIS — J80 Acute respiratory distress syndrome: Secondary | ICD-10-CM | POA: Diagnosis present

## 2018-02-08 DIAGNOSIS — I2510 Atherosclerotic heart disease of native coronary artery without angina pectoris: Secondary | ICD-10-CM | POA: Diagnosis present

## 2018-02-08 DIAGNOSIS — D72829 Elevated white blood cell count, unspecified: Secondary | ICD-10-CM | POA: Diagnosis present

## 2018-02-08 DIAGNOSIS — A419 Sepsis, unspecified organism: Secondary | ICD-10-CM | POA: Diagnosis not present

## 2018-02-08 LAB — CBC
HEMATOCRIT: 34.3 % — AB (ref 39.0–52.0)
HEMOGLOBIN: 11.5 g/dL — AB (ref 13.0–17.0)
MCH: 32.6 pg (ref 26.0–34.0)
MCHC: 33.5 g/dL (ref 30.0–36.0)
MCV: 97.2 fL (ref 78.0–100.0)
Platelets: 183 10*3/uL (ref 150–400)
RBC: 3.53 MIL/uL — ABNORMAL LOW (ref 4.22–5.81)
RDW: 12.7 % (ref 11.5–15.5)
WBC: 11.8 10*3/uL — ABNORMAL HIGH (ref 4.0–10.5)

## 2018-02-08 LAB — LEGIONELLA PNEUMOPHILA SEROGP 1 UR AG: L. pneumophila Serogp 1 Ur Ag: NEGATIVE

## 2018-02-08 NOTE — Progress Notes (Signed)
PROGRESS NOTE    Curtis Preston  HYQ:[id number] DOB: 1951-05-14 DOA: 02/06/2018 PCP: No primary care provider on file.   Brief Narrative:  Curtis Preston is a 67 y.o. 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  He was tachycardic, and had leukocytosis, consistent with sepsis.  Patient found to have multifocal lobar pneumonia on chest x-ray. CT chest taken on 02/08/2018, as the patient was very symptomatic, confirmed multifocal PNA. He is on IV Vanco, Cefepime and Azithromycin, as well as nebulizers.  Blood and sputum culture are pending.  Assessment & Plan:   Principal Problem:   Sepsis (HCC) Active Problems:   CAD (coronary artery disease)   COPD (chronic obstructive pulmonary disease) (HCC)   Depression with anxiety   GERD (gastroesophageal reflux disease)   Hypothyroidism   Myasthenia gravis (HCC)   PE (pulmonary thromboembolism) (HCC)   Tobacco abuse   Lobar pneumonia (HCC)   Sepsis due to lobar pneumonia [facility]):  Currently hemodynamically stable.  Due to immunosuppressed status, patient is receiving antibiotics with broad coverage.  CT of the chest was performed today, as the symptoms did not significantly improve, confirm multifocal pneumonia, in no apparent PE is noted in the film.  Respiratory virus panel is negative.  Sputum cultures and blood cultures are pending. -Continue telemetry bed  - IV Vancomycin and cefepime, azithromycin  to - prn Albuterol Nebs, DuoNeb prn for SOB - Singulair and Spiriva - Urine legionella and S. pneumococcal antigen are pending - Follow up blood culture x2, sputum culture are pending - ProCalcitonin level less than 1,  lactic acid 1.7 indictating improvement in sepsis parameters - IVF:  100 mL per hour of NS  Mucinex as needed  COPD (chronic  obstructive pulmonary disease) (HCC): no wheezing or rhonchi -Breathing treatment as above  CAD (coronary artery disease): s/p of stent. No CP Continue ASA and lipitor  HLD: Continue Lipitor  GERD: Continue Protonix  Hypothyroidism:  Continue home Synthroid  Myasthenia gravis: -on Imuran and pyridostigmine  PE (pulmonary thromboembolism) (HCC): -on Xarelto 20 mg daily  Tobacco abuse -Nicotine patch Counseling provided  Depression with anxiety: No SI or HI -Continue home medications.   DVT ppx:  On Xarelto  code Status: Full code Family Communication: None at bed side.  Patient retains capacity  disposition Plan:  Anticipate discharge back to previous home environment when clinically stable Consults called:  none Admission status: Inpatient   Antimicrobials: Azithromycin, cefepime, vancomycin  Subjective:  Patient reports still shortness of breath, and nonproductive cough, in the setting of pneumonia.  He has intermittent chest pain with cough, but denies any palpitations.  No syncope or presyncope.  He denies any hemoptysis.  He denies any nausea or vomiting.  He denies any abdominal pain.  No dysuria or gross hematuria.  No lower extremity swelling or calf pain.  Objective: Vitals:   02/07/18 2053 02/08/18 0526 02/08/18 0736 02/08/18 1203  BP: 117/64 136/73  (!) 152/75  Pulse: 85 92  88  Resp: 18 18  (!) 21  Temp: 97.8 F (36.6 C) 97.6 F (36.4 C)  97.8 F (36.6 C)  TempSrc: Oral Oral  Oral  SpO2: 96% 93% 97% 91%  Weight:  76.2 kg    Height:        Intake/Output Summary (Last 24 hours) at 02/08/2018 1240 Last data filed at  02/08/2018 0500 Gross per 24 hour  Intake 4206.55 ml  Output 2472 ml  Net 1734.55 ml   Filed Weights   02/06/18 2016 02/07/18 0117 02/08/18 0526  Weight: 76.2 kg 74.2 kg 76.2 kg    Examination:  General exam: Appears uncomfortable due to cough and shortness of breath.  This is improved with nasal cannula at 2 L   respiratory system: No use of accessory muscles, coarse breath sounds.  No wheezing is noted.  Cardiovascular system: S1 & S2 heard, RRR. No JVD, no murmurs, rubs, gallops or clicks. No pedal edema. Gastrointestinal system: Abdomen is nondistended, soft and nontender. No organomegaly or masses felt. Normal bowel sounds heard. Central nervous system: Alert and oriented. No focal neurological deficits. Extremities: Symmetric 5 x 5 power. Skin: No rashes, lesions or ulcers, pale Psychiatry: Judgement and insight appear normal. Mood & affect appropriate.     Data Reviewed: I have personally reviewed following labs and imaging studies  CBC: Recent Labs  Lab 02/06/18 2028 02/08/18 0340  WBC 13.3* 11.8*  NEUTROABS 11.6*  --   HGB 13.8 11.5*  HCT 41.4 34.3*  MCV 96.3 97.2  PLT 188 183   Basic Metabolic Panel: Recent Labs  Lab 02/06/18 2028  NA 138  K 4.3  CL 105  CO2 21*  GLUCOSE 105*  BUN 15  CREATININE 1.17  CALCIUM 8.5*   GFR: Estimated Creatinine Clearance: 64.2 mL/min (by C-G formula based on SCr of 1.17 mg/dL). Sepsis Labs: Recent Labs  Lab 02/07/18 0034 02/07/18 0657  PROCALCITON 0.16  --   LATICACIDVEN 1.4 1.9    Recent Results (from the past 240 hour(s))  Blood culture (routine x 2)     Status: None (Preliminary result)   Collection Time: 02/06/18 11:35 PM  Result Value Ref Range Status   Specimen Description BLOOD RIGHT ANTECUBITAL  Final   Special Requests   Final    BOTTLES DRAWN AEROBIC AND ANAEROBIC Blood Culture adequate volume   Culture   Final    NO GROWTH 1 DAY Performed at [facility], [address] [place], [place] [address]    Report Status PENDING  Incomplete  Blood culture (routine x 2)     Status: None (Preliminary result)   Collection Time: 02/06/18 11:45 PM  Result Value Ref Range Status   Specimen Description BLOOD LEFT HAND  Final   Special Requests   Final    BOTTLES DRAWN AEROBIC AND ANAEROBIC Blood Culture adequate  volume   Culture   Final    NO GROWTH 1 DAY Performed at [facility], [address], [place], [place] [address]    Report Status PENDING  Incomplete  Respiratory Panel by PCR     Status: None   Collection Time: 02/07/18  8:24 AM  Result Value Ref Range Status   Adenovirus NOT DETECTED NOT DETECTED Final   Coronavirus 229E NOT DETECTED NOT DETECTED Final   Coronavirus HKU1 NOT DETECTED NOT DETECTED Final   Coronavirus NL63 NOT DETECTED NOT DETECTED Final   Coronavirus OC43 NOT DETECTED NOT DETECTED Final   Metapneumovirus NOT DETECTED NOT DETECTED Final   Rhinovirus / Enterovirus NOT DETECTED NOT DETECTED Final   Influenza A NOT DETECTED NOT DETECTED Final   Influenza B NOT DETECTED NOT DETECTED Final   Parainfluenza Virus 1 NOT DETECTED NOT DETECTED Final   Parainfluenza Virus 2 NOT DETECTED NOT DETECTED Final   Parainfluenza Virus 3 NOT DETECTED NOT DETECTED Final   Parainfluenza  Virus 4 NOT DETECTED NOT DETECTED Final   Respiratory Syncytial Virus NOT DETECTED NOT DETECTED Final   Bordetella pertussis NOT DETECTED NOT DETECTED Final   Chlamydophila pneumoniae NOT DETECTED NOT DETECTED Final   Mycoplasma pneumoniae NOT DETECTED NOT DETECTED Final    Comment: Performed at [facility], [address] [place], [place] [address]         Radiology Studies: Dg Chest 2 View  Result Date: 02/08/2018 CLINICAL DATA:  Chest pain and shortness of breath EXAM: CHEST - 2 VIEW COMPARISON:  February 06, 2018 FINDINGS: The left central line is stable. There appears to be a small bulla in the left apex. No pneumothorax. Bilateral pulmonary opacities most prominent in the mid lower lungs. No other interval changes or acute abnormalities. IMPRESSION: 1. Stable left Port-A-Cath. 2. Bilateral pulmonary opacities may represent worsening edema versus worsening bilateral pneumonia. Recommend clinical correlation and follow-up to resolution. Electronically Signed   By: Gerome Sam III  M.D   On: 02/08/2018 07:08   Dg Chest 2 View  Result Date: 02/06/2018 CLINICAL DATA:  Shortness of breath cough and congestion for 3 days. EXAM: CHEST - 2 VIEW COMPARISON:  None. FINDINGS: Left subclavian injectable port terminates at the expected location of the cavoatrial junction. Cardiomediastinal silhouette is normal. Mediastinal contours appear intact. There is no evidence of pleural effusion or pneumothorax. Patchy interstitial and alveolar opacities in bilateral lungs, with particular prominence in the right upper lobe, right middle lobe and left lower lobe. Possible pulmonary nodule in the right mid lung field. Osseous structures are without acute abnormality. Soft tissues are grossly normal. IMPRESSION: Patchy interstitial and alveolar opacities in bilateral lungs. Findings may represent multifocal pneumonia or developing asymmetric pulmonary edema. Possible pulmonary nodule in the right mid lung field. Electronically Signed   By: Ted Mcalpine M.D.   On: 02/06/2018 21:12   Ct Chest Wo Contrast  Result Date: 02/08/2018 CLINICAL DATA:  Progressive hypoxemia.  Pneumonia. EXAM: CT CHEST WITHOUT CONTRAST TECHNIQUE: Multidetector CT imaging of the chest was performed following the standard protocol without IV contrast. COMPARISON:  Chest x-ray 02/08/2018 FINDINGS: Cardiovascular: Heart size is normal. Atherosclerotic calcifications are present coronary arteries. Atherosclerotic changes are present at the aortic arch. Pulmonary arteries mildly prominent bilaterally. No significant pericardial effusion is present. Mediastinum/Nodes: No significant mediastinal, axillary, or hilar adenopathy is present. The esophagus is within normal limits. The thoracic inlet is unremarkable. Lungs/Pleura: Extensive confluent ground-glass attenuation is present throughout the lower lobes, the lingula, and right middle lobe. Consolidation is most evident in the superior segment of the left lower lobe. Extensive  centrilobular emphysematous changes are present. No significant pleural effusion is present. Upper Abdomen: Unremarkable. Musculoskeletal: A remote superior endplate fractures present at T7. Vertebral body heights alignment are otherwise maintained. No focal lytic or blastic lesions are present. Ribs are within normal limits. IMPRESSION: 1. Extensive confluent ground-glass attenuation throughout both lungs with consolidation more prevalent in the superior segment of the left lower lobe. The findings are nonspecific, but most concerning for multi lobar pneumonia. Edema or possibly could have a similar appearance. This is to extensive for atelectasis. Consolidation in the superior segment in the left lower lobe suggests infection. 2.  Aortic Atherosclerosis (ICD10-I70.0). 3.  Emphysema (ICD10-J43.9). 4. Coronary artery disease Electronically Signed   By: Marin Roberts M.D.   On: 02/08/2018 11:47        Scheduled Meds: . aspirin EC  81 mg Oral Daily  . atorvastatin  40 mg  Oral QHS  . azaTHIOprine  200 mg Oral QHS  . azelastine  2 spray Each Nare BID  . baclofen  10 mg Oral TID  . buPROPion  150 mg Oral BID  . calcium-vitamin D  1 tablet Oral Daily  . fluticasone  1 spray Each Nare BID  . Influenza vac split quadrivalent PF  0.5 mL Intramuscular Tomorrow-1000  . ipratropium-albuterol  3 mL Inhalation Q6H  . levothyroxine  175 mcg Oral QAC breakfast  . mirtazapine  30 mg Oral QHS  . montelukast  10 mg Oral QHS  . morphine  15 mg Oral QHS  . multivitamin  1 tablet Oral BID  . nicotine  21 mg Transdermal Daily  . pantoprazole  40 mg Oral Daily  . pregabalin  50 mg Oral QHS  . pyridostigmine  60 mg Oral QID  . rivaroxaban  20 mg Oral Daily  . sodium chloride flush  10-40 mL Intracatheter Q12H  . tiotropium  18 mcg Inhalation Daily  . venlafaxine  225 mg Oral Daily  . vitamin B-12  1,000 mcg Oral Daily   Continuous Infusions: . sodium chloride 100 mL/hr at 02/07/18 0357  .  azithromycin 250 mg (02/08/18 0012)  . ceFEPime (MAXIPIME) IV 1 g (02/07/18 2233)  . vancomycin 1,000 mg (02/08/18 0159)     LOS: 0 days    Time spent: 45 minutes    Marlowe Kays, MD FACP [facility] Pager [phone]  If 7PM-7AM, please contact night-coverage [email or web address] Password [email or web address] 02/08/2018, 12:40 PM

## 2018-02-09 ENCOUNTER — Encounter (HOSPITAL_COMMUNITY): Payer: Self-pay | Admitting: *Deleted

## 2018-02-09 DIAGNOSIS — M3500 Sicca syndrome, unspecified: Secondary | ICD-10-CM

## 2018-02-09 DIAGNOSIS — R634 Abnormal weight loss: Secondary | ICD-10-CM

## 2018-02-09 DIAGNOSIS — R231 Pallor: Secondary | ICD-10-CM

## 2018-02-09 DIAGNOSIS — Z79899 Other long term (current) drug therapy: Secondary | ICD-10-CM

## 2018-02-09 DIAGNOSIS — Z7901 Long term (current) use of anticoagulants: Secondary | ICD-10-CM

## 2018-02-09 DIAGNOSIS — D849 Immunodeficiency, unspecified: Secondary | ICD-10-CM

## 2018-02-09 DIAGNOSIS — Z86711 Personal history of pulmonary embolism: Secondary | ICD-10-CM

## 2018-02-09 DIAGNOSIS — R0902 Hypoxemia: Secondary | ICD-10-CM

## 2018-02-09 DIAGNOSIS — Z77098 Contact with and (suspected) exposure to other hazardous, chiefly nonmedicinal, chemicals: Secondary | ICD-10-CM

## 2018-02-09 DIAGNOSIS — R0600 Dyspnea, unspecified: Secondary | ICD-10-CM

## 2018-02-09 DIAGNOSIS — G7000 Myasthenia gravis without (acute) exacerbation: Secondary | ICD-10-CM

## 2018-02-09 DIAGNOSIS — F17210 Nicotine dependence, cigarettes, uncomplicated: Secondary | ICD-10-CM

## 2018-02-09 DIAGNOSIS — G7001 Myasthenia gravis with (acute) exacerbation: Secondary | ICD-10-CM

## 2018-02-09 DIAGNOSIS — Z888 Allergy status to other drugs, medicaments and biological substances status: Secondary | ICD-10-CM

## 2018-02-09 DIAGNOSIS — R05 Cough: Secondary | ICD-10-CM

## 2018-02-09 DIAGNOSIS — R0981 Nasal congestion: Secondary | ICD-10-CM

## 2018-02-09 DIAGNOSIS — Z8701 Personal history of pneumonia (recurrent): Secondary | ICD-10-CM

## 2018-02-09 DIAGNOSIS — Z885 Allergy status to narcotic agent status: Secondary | ICD-10-CM

## 2018-02-09 DIAGNOSIS — R202 Paresthesia of skin: Secondary | ICD-10-CM

## 2018-02-09 DIAGNOSIS — J9601 Acute respiratory failure with hypoxia: Secondary | ICD-10-CM

## 2018-02-09 DIAGNOSIS — R0989 Other specified symptoms and signs involving the circulatory and respiratory systems: Secondary | ICD-10-CM

## 2018-02-09 DIAGNOSIS — J439 Emphysema, unspecified: Secondary | ICD-10-CM

## 2018-02-09 LAB — MRSA PCR SCREENING: MRSA BY PCR: NEGATIVE

## 2018-02-09 LAB — BLOOD GAS, ARTERIAL
ACID-BASE EXCESS: 0.9 mmol/L (ref 0.0–2.0)
Bicarbonate: 24.2 mmol/L (ref 20.0–28.0)
DRAWN BY: 441371
O2 CONTENT: 3.5 L/min
O2 SAT: 94.3 %
PATIENT TEMPERATURE: 98.4
PCO2 ART: 33.7 mmHg (ref 32.0–48.0)
pH, Arterial: 7.47 — ABNORMAL HIGH (ref 7.350–7.450)
pO2, Arterial: 67.8 mmHg — ABNORMAL LOW (ref 83.0–108.0)

## 2018-02-09 MED ORDER — BENZONATATE 100 MG PO CAPS
200.0000 mg | ORAL_CAPSULE | Freq: Three times a day (TID) | ORAL | Status: DC
  Administered 2018-02-09 – 2018-02-12 (×12): 200 mg via ORAL
  Filled 2018-02-09 (×13): qty 2

## 2018-02-09 MED ORDER — CIPROFLOXACIN HCL 500 MG PO TABS: 500.0000 mg | ORAL_TABLET | Freq: Two times a day (BID) | ORAL | Status: DC

## 2018-02-09 MED ORDER — CIPROFLOXACIN IN D5W 400 MG/200ML IV SOLN
400.0000 mg | Freq: Two times a day (BID) | INTRAVENOUS | Status: DC
  Administered 2018-02-09 – 2018-02-13 (×8): 400 mg via INTRAVENOUS
  Filled 2018-02-09 (×9): qty 200

## 2018-02-09 MED ORDER — GUAIFENESIN-DM 100-10 MG/5ML PO SYRP: 10.0000 mL | ORAL_SOLUTION | ORAL | Status: DC | PRN

## 2018-02-09 NOTE — Progress Notes (Signed)
RT did NIF and VC for patient per order.  Pt taken off Bipap and placed on [place] 3 L for test.  Pt had good effort on both.  NIF > -40 VC 3.03L  RN notified.

## 2018-02-09 NOTE — Progress Notes (Signed)
PROGRESS NOTE   Kortland Nichols  GEX:[id number]    DOB: April 03, 1951    DOA: 02/06/2018  PCP: No primary care provider on file.   I have briefly reviewed patients previous medical records in [facility].  Brief Narrative:  67 year old male from [place], visiting his dying brother-in-law in [place], [place] of myasthenia gravis on Imuran and pyridostigmine, CAD status post stent, COPD, depression and anxiety, GERD, hypothyroid, PE on Xarelto and tobacco abuse, history of pneumonia February 2019, h/o exposure to agent orange in [place], recurrent PNA's, admitted for 33 days at the [place] last year for PNA, presented to [facility] ED on 9/6 with 3 days history of cough productive of greenish sputum, dyspnea, chills but no fever.  He was admitted for multilobar pneumonia and started empirically on broad-spectrum antibiotic-vancomycin, ceftriaxone and azithromycin in the context of his immunosuppressed state.  Despite this, patient progressively worsened.  ID, pulmonology and neurology consulted 9/9.  Due to concern for respiratory fatigue, patient was transferred to ICU for close monitoring and ventilation if needed.  Assessment & Plan:   Principal Problem:   Sepsis ([place]) Active Problems:   CAD (coronary artery disease)   COPD (chronic obstructive pulmonary disease) (HCC)   Depression with anxiety   GERD (gastroesophageal reflux disease)   Hypothyroidism   Myasthenia gravis ([place])   PE (pulmonary thromboembolism) (HCC)   Tobacco abuse   Lobar pneumonia (HCC)   Multifocal pneumonia suspected due to opportunistic infection in a immunocompromised host: Failed treatment with usual broad-spectrum antibiotics including vancomycin, ceftriaxone and azithromycin.  Work-up (blood cultures x2, RSV panel, MRSA PCR, urine Legionella and pneumococcal antigen negative).  Clinically worsened 9/9.  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  ID requested urine histoplasma antigen, serum cryptococcal antigen and pulmonology consulted for possible bronchoscopy and BAL for samples.  Vancomycin, ceftriaxone and azithromycin discontinued.  Empiric ciprofloxacin initiated to treat for tularensis.  Pulmonology plans bronchoscopy 9/10 and Xarelto held for same.  Acute respiratory failure with hypoxia: Secondary to pneumonia complicating myasthenia gravis, COPD.  Pulmonology consulted, felt that patient was tiring, transferred to ICU for close monitoring, may need BiPAP and if does not improve then intubation.  Myasthenia gravis: Patient on Imuran and pyridostigmine PTA.  I unsuccessfully attempted to reach patient's primary Neurologist Dr. Mable Fill in [place] to discuss his case.  Neurology consulted.  Check NIF.  ? Hold Imuran, Neurology to advise.  COPD: No clinical bronchospasm.  Continue oxygen, bronchodilators.  Per pulmonology.  Sees pulmonology/Dr. Jess Barters in [place].  CAD status post stent: No chest pain reported.  Continue aspirin, statins.  Hyperlipidemia: Atorvastatin.  GERD: PPI.  Hypothyroid: Continue Synthroid.  Tobacco abuse: Cessation counseled.  Continue nicotine patch.  History of pulmonary embolism: Xarelto held in anticipation of bronchoscopy 9/10.  Anxiety and depression: Stable.  No suicidal or homicidal ideation  Profound weight loss: Unclear etiology.  Needs further work-up.  Normocytic anemia: Follow CBCs.   DVT prophylaxis: Xarelto held for bronchoscopy 9/10 Code Status: Full Family Communication: I discussed with patient's fiance (phone: [phone]) Disposition: Patient transferred to ICU 9/9   Consultants:  Infectious disease Pulmonology Neurology  Procedures:  None  Antimicrobials:  IV ceftriaxone, vancomycin and azithromycin discontinued. IV Cipro 9/9 >   Subjective: Patient seen  this morning.  Reports not feeling better or even feeling worse since admission.  Ongoing cough,  productive, flecks of blood in sputum.  Chest pain only on coughing.  Mild dyspnea.  No wheezing.  ROS: As above.  Objective:  Vitals:   02/09/18 1500 02/09/18 1600 02/09/18 1615 02/09/18 1700  BP: 126/76 129/71 129/71 130/69  Pulse: 94 88 92 100  Resp: (!) 30 (!) 24 (!) 26 (!) 31  Temp:      TempSrc:      SpO2: 95% 96% 98% 98%  Weight:      Height:        Examination:  General exam: Pleasant middle-aged male, moderately built, frail and chronically ill looking lying propped up in bed without distress but having episodes of cough. Respiratory system: Harsh and diminished breath sounds bilaterally with scattered few crackles but no wheezing or rhonchi. Respiratory effort normal. Cardiovascular system: S1 & S2 heard, RRR. No JVD, murmurs, rubs, gallops or clicks. No pedal edema.  Telemetry personally reviewed: Sinus rhythm. Gastrointestinal system: Abdomen is nondistended, soft and nontender. No organomegaly or masses felt. Normal bowel sounds heard. Central nervous system: Alert and oriented. No focal neurological deficits. Extremities: Symmetric 5 x 5 power. Skin: No rashes, lesions or ulcers Psychiatry: Judgement and insight appear normal. Mood & affect appropriate.     Data Reviewed: I have personally reviewed following labs and imaging studies  CBC: Recent Labs  Lab 02/06/18 2028 02/08/18 0340  WBC 13.3* 11.8*  NEUTROABS 11.6*  --   HGB 13.8 11.5*  HCT 41.4 34.3*  MCV 96.3 97.2  PLT 188 010   Basic Metabolic Panel: Recent Labs  Lab 02/06/18 2028  NA 138  K 4.3  CL 105  CO2 21*  GLUCOSE 105*  BUN 15  CREATININE 1.17  CALCIUM 8.5*     Recent Results (from the past 240 hour(s))  Blood culture (routine x 2)     Status: None (Preliminary result)   Collection Time: 02/06/18 11:35 PM  Result Value Ref Range Status   Specimen Description BLOOD RIGHT ANTECUBITAL   Final   Special Requests   Final    BOTTLES DRAWN AEROBIC AND ANAEROBIC Blood Culture adequate volume   Culture   Final    NO GROWTH 2 DAYS Performed at [facility], [place] [address], [place], [place] [address]    Report Status PENDING  Incomplete  Blood culture (routine x 2)     Status: None (Preliminary result)   Collection Time: 02/06/18 11:45 PM  Result Value Ref Range Status   Specimen Description BLOOD LEFT HAND  Final   Special Requests   Final    BOTTLES DRAWN AEROBIC AND ANAEROBIC Blood Culture adequate volume   Culture   Final    NO GROWTH 2 DAYS Performed at [facility], [place] [address], [place], [place] [address]    Report Status PENDING  Incomplete  Respiratory Panel by PCR     Status: None   Collection Time: 02/07/18  8:24 AM  Result Value Ref Range Status   Adenovirus NOT DETECTED NOT DETECTED Final   Coronavirus 229E NOT DETECTED NOT DETECTED Final   Coronavirus HKU1 NOT DETECTED NOT DETECTED Final   Coronavirus NL63 NOT DETECTED NOT DETECTED Final   Coronavirus OC43 NOT DETECTED NOT DETECTED Final   Metapneumovirus NOT DETECTED NOT DETECTED Final   Rhinovirus / Enterovirus NOT DETECTED NOT DETECTED Final   Influenza A NOT DETECTED NOT DETECTED Final   Influenza B NOT DETECTED NOT DETECTED Final   Parainfluenza Virus 1 NOT DETECTED NOT DETECTED Final  Parainfluenza Virus 2 NOT DETECTED NOT DETECTED Final   Parainfluenza Virus 3 NOT DETECTED NOT DETECTED Final   Parainfluenza Virus 4 NOT DETECTED NOT DETECTED Final   Respiratory Syncytial Virus NOT DETECTED NOT DETECTED Final   Bordetella pertussis NOT DETECTED NOT DETECTED Final   Chlamydophila pneumoniae NOT DETECTED NOT DETECTED Final   Mycoplasma pneumoniae NOT DETECTED NOT DETECTED Final    Comment: Performed at [facility], [place] [address] [place], [place] [address]  MRSA PCR Screening     Status: None   Collection Time: 02/09/18  2:56 PM  Result Value Ref Range Status   MRSA by PCR  NEGATIVE NEGATIVE Final    Comment:        The GeneXpert MRSA Assay (FDA approved for NASAL specimens only), is one component of a comprehensive MRSA colonization surveillance program. It is not intended to diagnose MRSA infection nor to guide or monitor treatment for MRSA infections. Performed at [facility], [place] [address] [place],  [address]          Radiology Studies: Dg Chest 2 View  Result Date: 02/08/2018 CLINICAL DATA:  Chest pain and shortness of breath EXAM: CHEST - 2 VIEW COMPARISON:  February 06, 2018 FINDINGS: The left central line is stable. There appears to be a small bulla in the left apex. No pneumothorax. Bilateral pulmonary opacities most prominent in the mid lower lungs. No other interval changes or acute abnormalities. IMPRESSION: 1. Stable left Port-A-Cath. 2. Bilateral pulmonary opacities may represent worsening edema versus worsening bilateral pneumonia. Recommend clinical correlation and follow-up to resolution. Electronically Signed   By: Dorise Bullion III M.D   On: 02/08/2018 07:08   Ct Chest Wo Contrast  Result Date: 02/08/2018 CLINICAL DATA:  Progressive hypoxemia.  Pneumonia. EXAM: CT CHEST WITHOUT CONTRAST TECHNIQUE: Multidetector CT imaging of the chest was performed following the standard protocol without IV contrast. COMPARISON:  Chest x-ray 02/08/2018 FINDINGS: Cardiovascular: Heart size is normal. Atherosclerotic calcifications are present coronary arteries. Atherosclerotic changes are present at the aortic arch. Pulmonary arteries mildly prominent bilaterally. No significant pericardial effusion is present. Mediastinum/Nodes: No significant mediastinal, axillary, or hilar adenopathy is present. The esophagus is within normal limits. The thoracic inlet is unremarkable. Lungs/Pleura: Extensive confluent ground-glass attenuation is present throughout the lower lobes, the lingula, and right middle lobe. Consolidation is most evident in the  superior segment of the left lower lobe. Extensive centrilobular emphysematous changes are present. No significant pleural effusion is present. Upper Abdomen: Unremarkable. Musculoskeletal: A remote superior endplate fractures present at T7. Vertebral body heights alignment are otherwise maintained. No focal lytic or blastic lesions are present. Ribs are within normal limits. IMPRESSION: 1. Extensive confluent ground-glass attenuation throughout both lungs with consolidation more prevalent in the superior segment of the left lower lobe. The findings are nonspecific, but most concerning for multi lobar pneumonia. Edema or possibly could have a similar appearance. This is to extensive for atelectasis. Consolidation in the superior segment in the left lower lobe suggests infection. 2.  Aortic Atherosclerosis (ICD10-I70.0). 3.  Emphysema (ICD10-J43.9). 4. Coronary artery disease Electronically Signed   By: San Morelle M.D.   On: 02/08/2018 11:47        Scheduled Meds: . aspirin EC  81 mg Oral Daily  . atorvastatin  40 mg Oral QHS  . azaTHIOprine  200 mg Oral QHS  . azelastine  2 spray Each Nare BID  . baclofen  10 mg Oral TID  . benzonatate  200 mg Oral TID  . buPROPion  150 mg Oral BID  . calcium-vitamin D  1 tablet Oral Daily  . fluticasone  1 spray Each Nare BID  . Influenza vac split quadrivalent PF  0.5 mL Intramuscular Tomorrow-1000  . ipratropium-albuterol  3 mL Inhalation Q6H  . levothyroxine  175 mcg Oral QAC breakfast  . mirtazapine  30 mg Oral QHS  . montelukast  10 mg Oral QHS  . morphine  15 mg Oral QHS  . multivitamin  1 tablet Oral BID  . nicotine  21 mg Transdermal Daily  . pantoprazole  40 mg Oral Daily  . pregabalin  50 mg Oral QHS  . pyridostigmine  60 mg Oral QID  . sodium chloride flush  10-40 mL Intracatheter Q12H  . tiotropium  18 mcg Inhalation Daily  . venlafaxine  225 mg Oral Daily  . vitamin B-12  1,000 mcg Oral Daily   Continuous Infusions: . sodium  chloride 100 mL/hr at 02/09/18 0546  . ciprofloxacin 400 mg (02/09/18 1556)     LOS: 1 day     Vernell Leep, MD, FACP, [facility]. [facility] Pager [phone]  If 7PM-7AM, please contact night-coverage [email or web address] Password [facility] 02/09/2018, 6:43 PM

## 2018-02-09 NOTE — Consult Note (Deleted)
Neurology Consultation  Reason for Consult: myasthenia crisis Referring Physician: HONGALGI  CC: SOB  History is obtained from: chart  HPI: Curtis Preston is a 67 y.o. male resides in [place] and sees Alto Denver at the [place] for his care of MG. HE was in [place] for a [employer] when he noted 6 day history of SOB which persisted until 3 days ago when it began to be associated with green sputum. His HX includes: "COPD, tobacco abuse, GERD, hypothyroidism, depression, mild venous gravis, CAD, stent placement, PE on Xarelto." Today he was found to have worsening SOB requiring CPAP and movement to ICU. While in the ED he was found to have WBC 13.3, BNP 89 and O2 saturation of 90-97%.  Patient was started on Azithromycin and Cipro but later D/C'd and placed on Vancomycin.   Chest CT showed "Consolidation in the superior segment in the left lower lobe suggests infection."  At home he is on Mestinon 60 mg 4 times daily.    ROS: A 14 point ROS was performed and is negative except as noted in the HPI.   Past Medical History:  Diagnosis Date  . CAD (coronary artery disease)   . COPD (chronic obstructive pulmonary disease) (HCC)   . Depression with anxiety   . GERD (gastroesophageal reflux disease)   . Hypothyroidism   . Myasthenia gravis (HCC)   . PE (pulmonary thromboembolism) (HCC)   . Tobacco abuse     Family History  Problem Relation Age of Onset  . Heart attack Father      Social History:   reports that he has been smoking cigarettes. He has been smoking about 0.50 packs per day. He has never used smokeless tobacco. He reports that he drinks alcohol. He reports that he does not use drugs.  Medications  Current Facility-Administered Medications:  .  0.9 %  sodium chloride infusion, , Intravenous, Continuous, Lorretta Harp, MD, Last Rate: 100 mL/hr at 02/09/18 0546 .  acetaminophen (TYLENOL) tablet 650 mg, 650 mg, Oral, Q6H PRN, Lorretta Harp, MD .  albuterol  (PROVENTIL) (2.5 MG/3ML) 0.083% nebulizer solution 2.5 mg, 2.5 mg, Nebulization, Q4H PRN, Lorretta Harp, MD, 2.5 mg at 02/09/18 1048 .  aspirin EC tablet 81 mg, 81 mg, Oral, Daily, Lorretta Harp, MD, 81 mg at 02/09/18 1032 .  atorvastatin (LIPITOR) tablet 40 mg, 40 mg, Oral, QHS, Lorretta Harp, MD, 40 mg at 02/08/18 2226 .  azathioprine (IMURAN) tablet 200 mg, 200 mg, Oral, QHS, Lorretta Harp, MD, 200 mg at 02/08/18 2228 .  azelastine (ASTELIN) 0.1 % nasal spray 2 spray, 2 spray, Each Nare, BID, Lorretta Harp, MD, 2 spray at 02/09/18 1033 .  baclofen (LIORESAL) tablet 10 mg, 10 mg, Oral, TID, Lorretta Harp, MD, 10 mg at 02/09/18 1034 .  benzonatate (TESSALON) capsule 200 mg, 200 mg, Oral, TID, Hongalgi, Anand D, MD, 200 mg at 02/09/18 1032 .  buPROPion (WELLBUTRIN SR) 12 hr tablet 150 mg, 150 mg, Oral, BID, Lorretta Harp, MD, 150 mg at 02/09/18 1032 .  calcium-vitamin D (OSCAL WITH D) 500-200 MG-UNIT per tablet 1 tablet, 1 tablet, Oral, Daily, Lorretta Harp, MD, 1 tablet at 02/09/18 1032 .  fluticasone (FLONASE) 50 MCG/ACT nasal spray 1 spray, 1 spray, Each Nare, BID, Lorretta Harp, MD, 1 spray at 02/09/18 1033 .  guaiFENesin-dextromethorphan (ROBITUSSIN DM) 100-10 MG/5ML syrup 10 mL, 10 mL, Oral, Q4H PRN, Hongalgi, Anand D, MD .  hydrALAZINE (APRESOLINE) injection 5 mg, 5 mg, Intravenous, Q2H PRN, Lorretta Harp,  MD .  HYDROcodone-acetaminophen (NORCO/VICODIN) 5-325 MG per tablet 1 tablet, 1 tablet, Oral, TID PRN, Lorretta Harp, MD, 1 tablet at 02/09/18 0425 .  Influenza vac split quadrivalent PF (FLUZONE HIGH-DOSE) injection 0.5 mL, 0.5 mL, Intramuscular, Tomorrow-1000, Sheehan, Theresa C, MD .  ipratropium-albuterol (DUONEB) 0.5-2.5 (3) MG/3ML nebulizer solution 3 mL, 3 mL, Inhalation, Q6H, Lorretta Harp, MD, 3 mL at 02/09/18 1409 .  levothyroxine (SYNTHROID, LEVOTHROID) tablet 175 mcg, 175 mcg, Oral, QAC breakfast, Lorretta Harp, MD, 175 mcg at 02/09/18 0806 .  mirtazapine (REMERON) tablet 30 mg, 30 mg, Oral, QHS, Lorretta Harp, MD, 30 mg  at 02/08/18 2226 .  montelukast (SINGULAIR) tablet 10 mg, 10 mg, Oral, QHS, Lorretta Harp, MD, 10 mg at 02/08/18 2226 .  morphine (MS CONTIN) 12 hr tablet 15 mg, 15 mg, Oral, QHS, Lorretta Harp, MD, 15 mg at 02/08/18 2226 .  multivitamin (PROSIGHT) tablet 1 tablet, 1 tablet, Oral, BID, Lorretta Harp, MD, 1 tablet at 02/09/18 1032 .  nicotine (NICODERM CQ - dosed in mg/24 hours) patch 21 mg, 21 mg, Transdermal, Daily, Lorretta Harp, MD, 21 mg at 02/09/18 1032 .  ondansetron (ZOFRAN) tablet 4 mg, 4 mg, Oral, Q6H PRN, Lorretta Harp, MD .  pantoprazole (PROTONIX) EC tablet 40 mg, 40 mg, Oral, Daily, Lorretta Harp, MD, 40 mg at 02/09/18 1032 .  pregabalin (LYRICA) capsule 50 mg, 50 mg, Oral, QHS, Lorretta Harp, MD, 50 mg at 02/08/18 2227 .  pyridostigmine (MESTINON) tablet 60 mg, 60 mg, Oral, QID, Lorretta Harp, MD, 60 mg at 02/09/18 1330 .  sodium chloride flush (NS) 0.9 % injection 10-40 mL, 10-40 mL, Intracatheter, Q12H, Lahoma Crocker, MD, 10 mL at 02/08/18 2228 .  sodium chloride flush (NS) 0.9 % injection 10-40 mL, 10-40 mL, Intracatheter, PRN, Lahoma Crocker, MD .  tiotropium [facility]) inhalation capsule 18 mcg, 18 mcg, Inhalation, Daily, Lorretta Harp, MD, 18 mcg at 02/09/18 0858 .  venlafaxine (EFFEXOR) tablet 225 mg, 225 mg, Oral, Daily, Lorretta Harp, MD, 225 mg at 02/09/18 1034 .  vitamin B-12 (CYANOCOBALAMIN) tablet 1,000 mcg, 1,000 mcg, Oral, Daily, Lorretta Harp, MD, 1,000 mcg at 02/09/18 1032 .  zolpidem (AMBIEN) tablet 5 mg, 5 mg, Oral, QHS PRN, Lorretta Harp, MD   Exam: Current vital signs: BP 131/74   Pulse 97   Temp 98.4 F (36.9 C) (Oral)   Resp (!) 31   Ht 6\' 1"  (1.854 m)   Wt 76.3 kg   SpO2 97%   BMI 22.20 kg/m  Vital signs in last 24 hours: Temp:  [98.4 F (36.9 C)-99.3 F (37.4 C)] 98.4 F (36.9 C) (09/09 1214) Pulse Rate:  [86-101] 97 (09/09 1409) Resp:  [20-31] 31 (09/09 1409) BP: (126-131)/(73-79) 131/74 (09/09 1214) SpO2:  [92 %-97 %] 97 % (09/09 1409) FiO2 (%):  [40 %] 40 % (09/09  1409) Weight:  [76.3 kg] 76.3 kg (09/09 0606)  GENERAL: Awake, alert in NAD HEENT: - Normocephalic and atraumatic,  LUNGS - SOB on BiPAP CV -  equal pulses bilaterally. Ext: warm, well perfused, intact peripheral pulses, __ edema      Labs I have reviewed labs in epic and the results pertinent to this consultation are:   CBC    Component Value Date/Time   WBC 11.8 (H) 02/08/2018 0340   RBC 3.53 (L) 02/08/2018 0340   HGB 11.5 (L) 02/08/2018 0340   HCT 34.3 (L) 02/08/2018 0340   PLT 183 02/08/2018 0340   MCV 97.2 02/08/2018 0340   MCH  32.6 02/08/2018 0340   MCHC 33.5 02/08/2018 0340   RDW 12.7 02/08/2018 0340   LYMPHSABS 0.7 02/06/2018 2028   MONOABS 0.7 02/06/2018 2028   EOSABS 0.1 02/06/2018 2028   BASOSABS 0.0 02/06/2018 2028    CMP     Component Value Date/Time   NA 138 02/06/2018 2028   K 4.3 02/06/2018 2028   CL 105 02/06/2018 2028   CO2 21 (L) 02/06/2018 2028   GLUCOSE 105 (H) 02/06/2018 2028   BUN 15 02/06/2018 2028   CREATININE 1.17 02/06/2018 2028   CALCIUM 8.5 (L) 02/06/2018 2028   GFRNONAA >60 02/06/2018 2028   GFRAA >60 02/06/2018 2028    Lipid Panel  No results found for: CHOL, TRIG, HDL, CHOLHDL, VLDL, LDLCALC, LDLDIRECT   Imaging I have reviewed the images obtained  CT-scan of the chest --Extensive confluent ground-glass attenuation throughout both lungs with consolidation more prevalent in the superior segment of the left lower lobe. The findings are nonspecific, but most concerning for multi lobar pneumonia. Edema or possibly could have a similar appearance. This is to extensive for atelectasis.Consolidation in the superior segment in the left lower lobe suggests infection.  Assessment:     Recommendations:  Drugs to avoid if you have Myasthenia Gravis:   Many drugs have been reported to have adverse effects in patients with MG (see below). However, not all patients react adversely to all these drugs. Conversely, not all "safe" drugs  can be used with impunity in patients with MG.As a rule, the listed drugs should be avoided whenever possible, and patients with MG should be followed closely when any new drug is introduced.  Drugs that may exacerbate MG  Antibiotics  Aminoglycosides: e.g., streptomycin, tobramycin, kanamycin  Quinolones: e.g., ciprofloxacin, levofloxacin, ofloxacin, gatifloxacin  Macrolides: e.g., erythromycin, azithromycin, telithromycin  Nondepolarizing muscle relaxants for surgery  d-Tubocurarine (curare), pancuronium, vecuronium, atracurium  Beta-blocking agents  Propranolol, atenolol, metoprolol  Local anesthetics and related agents  Procaine, Xylocaine in large amounts  Procainamide (for arrhythmias)  Botulinum toxin  Botox exacerbates weakness.  Quinine derivatives  Quinine, quinidine, chloroquine, mefloquine (Lariam)  Magnesium  Decreases ACh release  Penicillamine  May cause MG  Drugs with important interactions in MG  Cyclosporine  Broad range of drug interactions, which may raise or lower cyclosporine levels  Azathioprine  Avoid allopurinol; combination may result in myelosuppression.

## 2018-02-09 NOTE — Consult Note (Signed)
INFECTIOUS DISEASE ATTENDING ADDENDUM:   Date: 02/09/2018  Patient name: Curtis Preston  Medical record number: [id number]  Date of birth: 03/19/1951   This patient has been seen and discussed with the [facility] . Please see his note for complete details. I have reviewed the pertinent HPI, Past medical, surgical, family, social history, ROS, allergies, medications, pertinent laboratory and radiographic data and examined the patient independently.  I concur with his findings with the following additions/corrections:  This is a fascinating 67 year old man with history of myasthenia COPD with the with emphysema, smoking prior pulmonary embolism who had retired from his job as a Clinical research associate.  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  On talking to him about his job he did mention that he has run over several moles in the past several weeks.  Upon further questioning he also stated that they had run over several rabbits as well over the last several months.  In the past 3 months he has noticed an involuntary weight loss of 30 pounds with increasing sputum production and worsening dyspnea.  He tells me that he told his primary care physician in [place] about this but that he did not seem very concerned.  He has apparently had approximately 7 or 8 episodes of pneumonia in the past 8 years.  This 1 however is 1 of the worst that he has had.  He denies having any pet birds recently though he did have some remotely.  He has dogs and cats.  He has been visiting [place] because his wife is attending to her brother who is nearing the end of his life.  2 days prior to admission he had developed worsening shortness of breath with cough and chest tightness and greenish colored sputum production as well as a fever.  He was admitted to the service and a chest x-ray was performed which showed that she infiltrates  throughout the lungs.  CT scan of the chest without contrast showed patchy infiltrates throughout the lungs with some more consolidation appears segment of the left lower lobe.  He has also extensive bronchiectatic changes.  Despite rod spectrum antibiotics vancomycin and ceftriaxone and azithromycin he has felt worse.  His Legionella urine antigen has been negative as has his pneumococcal urine antigen.  We are consulted to the fact that he has had a multifocal pneumonia that is not improved much despite already 3 days of broad-spectrum antimicrobials.  Additionally he is being immunosuppressed with Imuran.    On exam when I examined him earlier he certainly was chronically ill-appearing though not acutely ill appearing.  He did have some significant weakness and it was difficult for him to sit up in bed he had crackles posteriorly in his lungs.   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  Certainly he would be at risk also for other organisms such as nocardia though his exposure to vegetation beyond mowing grass does not seem extensive.  Certainly also he will be asked risk for infection with an an endemic mycosis such as histoplasma.  Send off a urine histoplasma antigen as well as a serum cryptococcal antigen though his imaging does not suggest cryptococcus.  I have discontinued his systemic antibiotics in anticipation of bronchoscopy tomorrow.  I was going to withhold ciprofloxacin which would be the drug that I would choose to use to treat Francisella and him to optimize the yield on cultures with bronchoscopy.  However since we examined him this afternoon his clinical status has deteriorated and he has been moved to the stepdown unit into her heart.  Therefore I will initiate ciprofloxacin.  We have warned the microbiology lab and I spoke with Simeon Craft by the fact that we  are concerned about Francisella tularensis.  They will take appropriate bio safety lab measures.  Note it is not transmissible disease human to human but more something that we have to worry about in the lab.  Also see if we can send off serologies for the CDC for Francisella.  On bronchoscopy I would send cultures for bacterial cultures for AFB stains and cultures fungal stains and cultures and for cultures on buffered charcoal yeast extract i.e. cultures such as Legionella and that can be done for organisms such as nocardia.  I will hold off on any further empiric antimicrobial  therapy at present    [facility] 02/09/2018, 3:18 PM

## 2018-02-09 NOTE — Consult Note (Signed)
Curtis Preston  ZOX:[id number] DOB: Apr 05, 1951 DOA: 02/06/2018 PCP: No primary care provider on file.    LOS: 1 day   Reason for Consult / Chief Complaint:  Pneumonia in immunocompromised patient.   Consulting MD and date of consult:  Dr. Waymon Amato  HPI/summary of hospital stay:  67 year old male with PMH as below, which is significant for Myasthenia gravis on imuran, COPD, PE on Xarelto, and CAD. He was in his usual state of health (gets winded daily) until about early August of this year, when he developed worsening shortness of breath with associated productive cough (green sputum). This persisted for about 6 weeks, until about 9/6, when his symptoms significant worsened after traveling here to [place] from [place] (where he lives). SOB worsened as did his productive cough, which caused him to present to ED. Workup in ED suggested pneumonia and he was started on empiric antibiotics. CT demonstrated bilateral ground glass, and most concerning for multi-focal pneumonia. Infectious disease consultation sees benefit in bronchoscopy for culture. PCCM consulted.   Subjective    Assessment & Plan:   Acute hypoxemic respiratory failure secondary to community acquired pneumonia in an immunosuppressed patient - Empiric ABX as recommended by Infectious disease consultants. - Will plan for bronchoscopy 9/10 1400 for culture. NPO after midnight. - Follow cultures, urinary bacterial antigens.  - Supplemental O2 to keep SpO2 90-95% (currently 2L [place])  Hemoptysis: Very mild, just some specks in his sputum. seems to have been induced by continued coughing.  - Cough suppression: he already gets hefty doses of morphine scheduled, will add tessalon.    COPD: Does not seem to be having an acute exacerbation.  - continue supplemental O2 - Scheduled duonebs, budesonide.  - Holding home Stiolto - Take care not to hyperoxygenate   Myasthenia Gravis - management per primary includes continue imuran,  pyridostigmine   Consultants: date of consult/date signed off (if applicable)/final recs  ID  Procedures:   Significant Diagnostic Tests: CT chest 9/9 > Extensive confluent ground-glass attenuation throughout both lungs with consolidation more prevalent in the superior segment of the left lower lobe. The findings are nonspecific, but most concerning for multi lobar pneumonia. Edema or possibly could have a similar appearance. This is to extensive for atelectasis. Consolidation in the superior segment in the left lower lobe suggests infection.  Micro Data: Blood 9/6 > Sputum 9/6 > RVP 9/7: Negative  Antimicrobials:  Azithromycin 9/6 > 9/9 Cefepime 9/6 > 9/9  Vancomycin 9/6 > 9/9 Ciprofloxacin 9/9 >   Objective    Examination: General: Elderly chronically ill appearing male in NAD HENT: [place]/AT, PERRL, no JVD Lungs: Rales bilaterally. Very mild dyspnea on 2 liters.  Cardiovascular: RRR, no MRG Abdomen: Soft, non-tender, non-distended Extremities: No acute deformity Neuro: Alert, oriented, non-focal  Blood pressure 129/73, pulse 99, temperature 99 F (37.2 C), resp. rate 20, height 6\' 1"  (1.854 m), weight 76.3 kg, SpO2 93 %.        Intake/Output Summary (Last 24 hours) at 02/09/2018 1131 Last data filed at 02/09/2018 0916 Gross per 24 hour  Intake 3521.67 ml  Output 1750 ml  Net 1771.67 ml   Filed Weights   02/07/18 0117 02/08/18 0526 02/09/18 0606  Weight: 74.2 kg 76.2 kg 76.3 kg     Labs    CBC: Recent Labs  Lab 02/06/18 2028 02/08/18 0340  WBC 13.3* 11.8*  NEUTROABS 11.6*  --   HGB 13.8 11.5*  HCT 41.4 34.3*  MCV 96.3 97.2  PLT 188 183   Basic Metabolic Panel: Recent Labs  Lab 02/06/18 2028  NA 138  K 4.3  CL 105  CO2 21*  GLUCOSE 105*  BUN 15  CREATININE 1.17  CALCIUM 8.5*   GFR: Estimated Creatinine Clearance: 64.3 mL/min (by C-G formula based on SCr of 1.17 mg/dL). Recent Labs  Lab 02/06/18 2028 02/07/18 0034 02/07/18 0657  02/08/18 0340  PROCALCITON  --  0.16  --   --   WBC 13.3*  --   --  11.8*  LATICACIDVEN  --  1.4 1.9  --    Liver Function Tests: No results for input(s): AST, ALT, ALKPHOS, BILITOT, PROT, ALBUMIN in the last 168 hours. No results for input(s): LIPASE, AMYLASE in the last 168 hours. No results for input(s): AMMONIA in the last 168 hours. ABG No results found for: PHART, PCO2ART, PO2ART, HCO3, TCO2, ACIDBASEDEF, O2SAT  Coagulation Profile: No results for input(s): INR, PROTIME in the last 168 hours. Cardiac Enzymes: No results for input(s): CKTOTAL, CKMB, CKMBINDEX, TROPONINI in the last 168 hours. HbA1C: No results found for: HGBA1C CBG: No results for input(s): GLUCAP in the last 168 hours.   Review of Systems:     Past medical history  He,  has a past medical history of CAD (coronary artery disease), COPD (chronic obstructive pulmonary disease) (HCC), Depression with anxiety, GERD (gastroesophageal reflux disease), Hypothyroidism, Myasthenia gravis (HCC), PE (pulmonary thromboembolism) (HCC), and Tobacco abuse.   Surgical History    Past Surgical History:  Procedure Laterality Date  . Right ulnar nerve impingement       Social History   Social History   Socioeconomic History  . Marital status: Single    Spouse name: Not on file  . Number of children: Not on file  . Years of education: Not on file  . Highest education level: Not on file  Occupational History  . Not on file  Social Needs  . Financial resource strain: Patient refused  . Food insecurity:    Worry: Patient refused    Inability: Patient refused  . Transportation needs:    Medical: Patient refused    Non-medical: Patient refused  Tobacco Use  . Smoking status: Current Every Day Smoker    Packs/day: 0.50    Types: Cigarettes  . Smokeless tobacco: Never Used  Substance and Sexual Activity  . Alcohol use: Yes    Comment: occasional  . Drug use: Never  . Sexual activity: Not on file  Lifestyle   . Physical activity:    Days per week: Not on file    Minutes per session: Not on file  . Stress: Not on file  Relationships  . Social connections:    Talks on phone: Patient refused    Gets together: Patient refused    Attends religious service: Patient refused    Active member of club or organization: Patient refused    Attends meetings of clubs or organizations: Patient refused    Relationship status: Patient refused  . Intimate partner violence:    Fear of current or ex partner: Patient refused    Emotionally abused: Patient refused    Physically abused: Patient refused    Forced sexual activity: Patient refused  Other Topics Concern  . Not on file  Social History Narrative  . Not on file  ,  reports that he has been smoking cigarettes. He has been smoking about 0.50 packs per day. He has never used smokeless tobacco. He reports that he drinks  alcohol. He reports that he does not use drugs.   Family history   His family history includes Heart attack in his father.   Allergies Allergies  Allergen Reactions  . Hydromorphone Other (See Comments)    hallucinations   . Immune Globulin Other (See Comments)     Bilateral pulmonary embolism      Home meds  Prior to Admission medications   Medication Sig Start Date End Date Taking? Authorizing Provider  aspirin (ASPIRIN 81) 81 MG EC tablet Take 81 mg by mouth daily. 06/01/12  Yes [provider]  atorvastatin (LIPITOR) 40 MG tablet Take 40 mg by mouth at bedtime.   Yes [provider]  azathioprine (IMURAN) 100 MG tablet Take 200 mg by mouth at bedtime. 12/30/13  Yes [provider]  azelastine (ASTELIN) 0.1 % nasal spray Place 2 sprays into the nose 2 (two) times daily.   Yes [provider]  baclofen (LIORESAL) 10 MG tablet Take 10 mg by mouth 3 (three) times daily. 01/15/18  Yes [provider]  benzonatate (TESSALON) 200 MG capsule Take 1 capsule by mouth as needed for cough.    Yes [provider]  buPROPion (WELLBUTRIN SR) 150 MG 12 hr tablet Take 150 mg by mouth 2 (two) times daily.   Yes [provider]  Calcium Carb-Ergocalciferol 500-200 MG-UNIT TABS Take 1 tablet by mouth daily.   Yes [provider]  fluticasone (FLONASE) 50 MCG/ACT nasal spray Place 1 spray into the nose 2 (two) times daily.   Yes [provider]  guaiFENesin-dextromethorphan (ROBITUSSIN DM) 100-10 MG/5ML syrup Take 10 mLs by mouth 2 (two) times daily as needed for cough.  07/31/17  Yes [provider]  HYDROcodone-acetaminophen (NORCO/VICODIN) 5-325 MG tablet Take 1 tablet by mouth 3 (three) times daily as needed. 01/15/18  Yes [provider]  ipratropium (ATROVENT) 0.03 % nasal spray Place 1 spray into both nostrils every 12 (twelve) hours.   Yes [provider]  ipratropium-albuterol (DUONEB) 0.5-2.5 (3) MG/3ML SOLN Inhale 3 mLs into the lungs every 6 (six) hours. 10/21/16  Yes [provider]  levothyroxine (SYNTHROID, LEVOTHROID) 175 MCG tablet Take 175 mcg by mouth daily before breakfast.   Yes [provider]  mirtazapine (REMERON) 15 MG tablet Take 30 mg by mouth at bedtime.   Yes [provider]  montelukast (SINGULAIR) 10 MG tablet Take 10 mg by mouth at bedtime.   Yes [provider]  morphine (MS CONTIN) 15 MG 12 hr tablet Take 15 mg by mouth at bedtime. 01/15/18  Yes [provider]  Multiple Vitamins-Minerals (ICAPS AREDS 2 PO) Take 1 tablet by mouth 2 (two) times daily.   Yes [provider]  omeprazole (PRILOSEC) 40 MG capsule Take 40 mg by mouth 2 (two) times daily. 10/21/16  Yes [provider]  ondansetron (ZOFRAN) 4 MG tablet Take 4 mg by mouth every 6 (six) hours as needed for nausea or vomiting.  12/30/13  Yes [provider]  pregabalin (LYRICA) 50 MG capsule Take 50 mg by mouth at bedtime.   Yes [provider]  pyridostigmine (MESTINON)  60 MG tablet Take 60 mg by mouth 4 (four) times daily.  10/21/16  Yes [provider]  rivaroxaban (XARELTO) 20 MG TABS tablet Take 20 mg by mouth daily. 11/09/16  Yes [provider]  Tiotropium Bromide-Olodaterol (STIOLTO RESPIMAT) 2.5-2.5 MCG/ACT AERS Inhale 2 puffs into the lungs daily.   Yes [provider]  venlafaxine (  EFFEXOR) 75 MG tablet Take 225 mg by mouth daily.   Yes [provider]  vitamin B-12 (CYANOCOBALAMIN) 1000 MCG tablet Take 1,000 mcg by mouth daily.   Yes [provider]      Joneen Roach, AGACNP-BC [facility] Pager [phone] or [phone]  02/09/2018 12:05 PM

## 2018-02-09 NOTE — Consult Note (Addendum)
[facility]    Date of Admission:  02/06/2018   Total days of antibiotics 3 (azithromycin, cefepime, vancomycin)        Day 1 ciprofloxacin       Reason for Consult: shortness of breath and cough, pneumonia    Referring Provider: Algis Liming, MD Primary Care Provider: none  Assessment: 67 year-old male, immunocompromised, who has hypoxemia, dyspnea, and cough associated with multilobar ground-glass infiltrates on CT. 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 He is also not responding to the antibiotics and his radiographic findings are not typical of bacterial pneumonia and other common organisms. HIV, RPP, urine legionella and S. pneumo antigens are all negative. Blood cultures are negative at 48 hours but F. Tularensis requires longer to grow, so I've asked the lab to extend cultures to 10 days. Differential is broad however, and must still consider histoplasmosis, aspergillus, nocardia, PCP, and other fungal organisms, as well as viral causes; must also consider ILD and autoimmune causes; this could be toxin-induced pneumonitis from agent [place] that he was exposed to in [place]. It is less likely he has TB but no known risk, or cancer given smoking history but CT did not reveal tumor. He has been seen by pulmonology and will undergo BAL tomorrow. Given he is not getting better with current antibiotics, we recommend stopping them, as this is unlikely to be a typical bacterial pneumonia. We will start empiric treatment of F. Tularensis with ciprofloxacin given he has worsened this afternoon, although we would have preferred to start after BAL.    Plan: 1. Discontinue current antibiotics 2. Start ciprofloxacin for F. tularensis 3. Appreciate Pulmonology for BAL on 9/10 4. BAL order: routine bacterial culture supplemented with cysteine (required for F. tularensis to grow), AFB  culture and stain, fungal culture and stain, BCYE agar culture (for legionella and nocardia), gram stain, cell count. Growth is optimal at [facility]. 5. Have already asked micro lab to extend blood cultures to 10 days. May need longer. 6. Order urine histoplasma antigen, serum cryptococcal antigen  Principal Problem:   Sepsis ([place]) Active Problems:   CAD (coronary artery disease)   COPD (chronic obstructive pulmonary disease) (H[facility])   Depression with anxiety   GERD (gastroesophageal reflux disease)   Hypothyroidism   Myasthenia gravis ([place])   PE (pulmonary thromboembolism) ([facility])   Tobacco abuse   Lobar pneumonia ([facility])   Scheduled Meds: . aspirin EC  81 mg Oral Daily  . atorvastatin  40 mg Oral QHS  . azaTHIOprine  200 mg Oral QHS  . azelastine  2 spray Each Nare BID  . baclofen  10 mg Oral TID  . benzonatate  200 mg Oral TID  . buPROPion  150 mg Oral BID  . calcium-vitamin D  1 tablet Oral Daily  . fluticasone  1 spray Each Nare BID  . Influenza vac split quadrivalent PF  0.5 mL Intramuscular Tomorrow-1000  . ipratropium-albuterol  3 mL Inhalation Q6H  . levothyroxine  175 mcg Oral QAC breakfast  . mirtazapine  30 mg Oral QHS  . montelukast  10 mg Oral QHS  . morphine  15 mg Oral QHS  . multivitamin  1 tablet Oral BID  . nicotine  21 mg Transdermal Daily  . pantoprazole  40 mg Oral Daily  . pregabalin  50 mg Oral QHS  . pyridostigmine  60 mg Oral QID  .  sodium chloride flush  10-40 mL Intracatheter Q12H  . tiotropium  18 mcg Inhalation Daily  . venlafaxine  225 mg Oral Daily  . vitamin B-12  1,000 mcg Oral Daily   Continuous Infusions: . sodium chloride 100 mL/hr at 02/09/18 0546  . ciprofloxacin 400 mg (02/09/18 1556)   PRN Meds:.acetaminophen, albuterol, guaiFENesin-dextromethorphan, hydrALAZINE, HYDROcodone-acetaminophen, ondansetron, sodium chloride flush, zolpidem  HPI: Curtis Preston is a 67 y.o. 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 He reports starting to feel sick about 2-3 weeks ago with a cold, cough, and fatigue. Then about 4-5 days ago his cough worsened and he began to experience increased shortness of breath and chest tightness. His symptoms are worsened with exertion and lying flat, and nothing makes it better. He reports his brother-in-law has been coughing for months but has not seen a doctor. He endorses chills over the last few weeks. Over the past 3 months he has lost approximately 30 pounds, has had increasing sputum production, and progressively worsening dyspnea. He denies fevers or frequent sweats, and denies exposure to TB. He is from [place], [place] and is a retired Chief Executive Officer, and now works in [employer]. He reports running over moles and rabbits with the lawn mower, as recently as five months ago. He denies exposure to birds or bats. He denies travel recently but has live in [place], [place], and [place]. He was exposed to agent [place] during the [place] war. He has had pneumonia five times in the last eight years, some which have required hospitalization, most recently in February 2019 with CAP. However, he feels worse this time than prior episodes of pneumonia because he has more trouble catching his breath, and has more chest tightness anteriorly and posteriorly. He denies hemoptysis, hematochezia, hematuria, rashes, adenopathy. He has a long history of smoking. He denies IV drug use or heavy alcohol use.    Review of Systems: Review of Systems  Constitutional: Positive for chills, malaise/fatigue and weight loss. Negative for diaphoresis and fever.  HENT: Positive for congestion.   Eyes: Negative for photophobia and redness.  Respiratory: Positive for cough, sputum production and shortness of breath. Negative for hemoptysis and wheezing.     Cardiovascular: Positive for chest pain and orthopnea. Negative for palpitations, leg swelling and PND.  Gastrointestinal: Negative for abdominal pain, blood in stool, constipation, diarrhea, melena, nausea and vomiting.  Genitourinary: Negative for dysuria, frequency, hematuria and urgency.  Musculoskeletal: Negative for joint pain.  Skin: Negative for rash.  Neurological: Positive for tingling. Negative for sensory change, focal weakness and headaches.  Psychiatric/Behavioral: Negative for substance abuse.    Past Medical History:  Diagnosis Date  . Bronchiectasis ([place])   . CAD (coronary artery disease)   . COPD (chronic obstructive pulmonary disease) ([place])   . Depression with anxiety   . GERD (gastroesophageal reflux disease)   . Hypothyroidism   . Myasthenia gravis ([place])   . PE (pulmonary thromboembolism) ([place])   . Sjogren's disease ([place])   . Tobacco abuse     Social History   Tobacco Use  . Smoking status: Current Every Day Smoker    Packs/day: 0.50    Types: Cigarettes  . Smokeless tobacco: Never Used  Substance Use Topics  . Alcohol use: Yes    Comment: occasional  . Drug use: Never    Family History  Problem Relation Age of Onset  . Heart attack Father    Allergies  Allergen Reactions  . Hydromorphone Other (See Comments)    hallucinations   . Immune Globulin Other (See Comments)     Bilateral pulmonary embolism      OBJECTIVE: Blood pressure 131/74, pulse 97, temperature 98.4 F (36.9 C), temperature source Oral, resp. rate (!) 31, height _0  (1.854 m), weight 76.3 kg, SpO2 97 %.  Physical Exam  Constitutional: He is oriented to person, place, and time. He appears ill. He appears distressed.  HENT:  Mouth/Throat: No oropharyngeal exudate or posterior oropharyngeal edema.  Eyes: Pupils are equal, round, and reactive to light. EOM are normal.  Neck: Normal range of motion. Neck supple.  Cardiovascular: Normal rate, regular rhythm, normal heart  sounds and intact distal pulses. Exam reveals no gallop and no friction rub.  No murmur heard. Pulmonary/Chest: Tachypnea noted. He is in respiratory distress. He has no decreased breath sounds. He has no wheezes. He has rales in the right middle field, the right lower field, the left middle field and the left lower field.  Abdominal: Soft. Bowel sounds are normal. He exhibits no distension. There is no tenderness.  Musculoskeletal:       Right lower leg: Normal.       Left lower leg: Normal.  Lymphadenopathy:    He has no cervical adenopathy.  Neurological: He is alert and oriented to person, place, and time.  Skin: Skin is warm. No rash noted. He is not diaphoretic. There is pallor.    Lab Results Lab Results  Component Value Date   WBC 11.8 (H) 02/08/2018   HGB 11.5 (L) 02/08/2018   HCT 34.3 (L) 02/08/2018   MCV 97.2 02/08/2018   PLT 183 02/08/2018    Lab Results  Component Value Date   CREATININE 1.17 02/06/2018   BUN 15 02/06/2018   NA 138 02/06/2018   K 4.3 02/06/2018   CL 105 02/06/2018   CO2 21 (L) 02/06/2018   No results found for: ALT, AST, GGT, ALKPHOS, BILITOT   Microbiology: Recent Results (from the past 240 hour(s))  Blood culture (routine x 2)     Status: None (Preliminary result)   Collection Time: 02/06/18 11:35 PM  Result Value Ref Range Status   Specimen Description BLOOD RIGHT ANTECUBITAL  Final   Special Requests   Final    BOTTLES DRAWN AEROBIC AND ANAEROBIC Blood Culture adequate volume   Culture   Final    NO GROWTH 2 DAYS Performed at [facility], [place] [address], [place], [place] [address]    Report Status PENDING  Incomplete  Blood culture (routine x 2)     Status: None (Preliminary result)   Collection Time: 02/06/18 11:45 PM  Result Value Ref Range Status   Specimen Description BLOOD LEFT HAND  Final   Special Requests   Final    BOTTLES DRAWN AEROBIC AND ANAEROBIC Blood Culture adequate volume   Culture   Final    NO GROWTH 2  DAYS Performed at [facility], [place] [address], [place], [place] [address]    Report Status PENDING  Incomplete  Respiratory Panel by PCR     Status: None   Collection Time: 02/07/18  8:24 AM  Result Value Ref Range Status   Adenovirus NOT DETECTED NOT DETECTED Final   Coronavirus 229E NOT DETECTED NOT DETECTED Final   Coronavirus HKU1 NOT DETECTED NOT DETECTED Final   Coronavirus NL63  NOT DETECTED NOT DETECTED Final   Coronavirus OC43 NOT DETECTED NOT DETECTED Final   Metapneumovirus NOT DETECTED NOT DETECTED Final   Rhinovirus / Enterovirus NOT DETECTED NOT DETECTED Final   Influenza A NOT DETECTED NOT DETECTED Final   Influenza B NOT DETECTED NOT DETECTED Final   Parainfluenza Virus 1 NOT DETECTED NOT DETECTED Final   Parainfluenza Virus 2 NOT DETECTED NOT DETECTED Final   Parainfluenza Virus 3 NOT DETECTED NOT DETECTED Final   Parainfluenza Virus 4 NOT DETECTED NOT DETECTED Final   Respiratory Syncytial Virus NOT DETECTED NOT DETECTED Final   Bordetella pertussis NOT DETECTED NOT DETECTED Final   Chlamydophila pneumoniae NOT DETECTED NOT DETECTED Final   Mycoplasma pneumoniae NOT DETECTED NOT DETECTED Final    Comment: Performed at [facility], [place] [address] [place], [place] [address]    Durenda Hurt, [facility] [place] [facility] [phone] pager  02/09/2018, 4:07 PM

## 2018-02-09 NOTE — Consult Note (Addendum)
NEURO HOSPITALIST CONSULT NOTE   Requesting physician: Dr. Algis Liming  Reason for Consult: Myasthenia gravis complicating pneumonia  History obtained from:    Patient and Chart     HPI:                                                                                                                                          Curtis Preston is an 67 y.o. male with myasthenia gravis, immunocompromised due to chronic Imuran treatment, who presented to the ED on 9/6 with chest tightness, SOB and congestion x 3 days, with cough productive of green phlegm. He is from [facility], and is here in [place] visiting his dying brother-in-law.  Multifocal PNA and sepsis were diagnosed and IV Vanco, Cefepime and Azithromycin were started.   Despite antibiotic treatment, the patient has progressively worsened; due to concern for respiratory fatigue, the patient was transferred to the ICU today for close monitoring and ventilation if needed.   Per ID note, his radiographic findings are not typical of bacterial pneumonia and other common organisms. The DDx for his PNA is broad, including what is felt most likely to be a pneumonia caused by Franscicella tularensis because of his animal exposure, weight loss, and pulmonary symptoms. Other components of ID's DDX include histoplasmosis, aspergillus, nocardia, PCP, and other fungal organisms, as well as viral causes. ID feels that ILD and autoimmune causes as well as a toxin-induced pneumonitis from agent Haig Prophet (he was exposed in [place]) are also to be considered. It is less likely he has TB given no known risk. HIV, RPP, urine legionella and S. pneumo antigens were all negative. Of note, he had been admitted for 33 days at the [place] last year for PNA. He has been seen by pulmonology and will undergo BAL tomorrow.  Complicating the picture, the patient has a PMHx of myasthenia gravis on Imuran and pyridostigmine. Home dose of Mestinon is 60 mg 4  times daily. Per patient, his Mestinon dose was recently increased, and he endorses significantly increased secretions since then, although it is not clear if this is due to the cholinergic effect of Mestinon or if the increased secretions are entirely due to his PNA.     His PMHx also includes 3 prior episodes of PE anticoagulated with Xarelto, CAD status post stent, COPD, depression and anxiety, GERD, hypothyroid, PE on Xarelto and tobacco abuse, history of pneumonia February 2019, h/o exposure to agent orange in [place], recurrent PNA's.    Past Medical History:  Diagnosis Date  . Bronchiectasis ([place])   . CAD (coronary artery disease)   . COPD (chronic obstructive pulmonary disease) ([place])   . Depression with anxiety   . GERD (gastroesophageal reflux disease)   . Hypothyroidism   . Myasthenia gravis ([place])   .  PE (pulmonary thromboembolism) ([place])   . Sjogren's disease ([place])   . Tobacco abuse     Past Surgical History:  Procedure Laterality Date  . Right ulnar nerve impingement      Family History  Problem Relation Age of Onset  . Heart attack Father               Social History:  reports that he has been smoking cigarettes. He has been smoking about 0.50 packs per day. He has never used smokeless tobacco. He reports that he drinks alcohol. He reports that he does not use drugs.  Allergies  Allergen Reactions  . Hydromorphone Other (See Comments)    hallucinations   . Immune Globulin Other (See Comments)     Bilateral pulmonary embolism      MEDICATIONS:                                                                                                                     Prior to Admission:  Medications Prior to Admission  Medication Sig Dispense Refill Last Dose  . aspirin (ASPIRIN 81) 81 MG EC tablet Take 81 mg by mouth daily.   02/06/2018 at 1300  . atorvastatin (LIPITOR) 40 MG tablet Take 40 mg by mouth at bedtime.   02/05/2018 at Unknown time  . azathioprine (IMURAN) 100  MG tablet Take 200 mg by mouth at bedtime.   02/05/2018 at Unknown time  . azelastine (ASTELIN) 0.1 % nasal spray Place 2 sprays into the nose 2 (two) times daily.   02/06/2018 at Unknown time  . baclofen (LIORESAL) 10 MG tablet Take 10 mg by mouth 3 (three) times daily.  3 02/06/2018 at Unknown time  . benzonatate (TESSALON) 200 MG capsule Take 1 capsule by mouth as needed for cough.   unk  . buPROPion (WELLBUTRIN SR) 150 MG 12 hr tablet Take 150 mg by mouth 2 (two) times daily.   02/06/2018 at Unknown time  . Calcium Carb-Ergocalciferol 500-200 MG-UNIT TABS Take 1 tablet by mouth daily.   02/06/2018 at Unknown time  . fluticasone (FLONASE) 50 MCG/ACT nasal spray Place 1 spray into the nose 2 (two) times daily.   02/06/2018 at Unknown time  . guaiFENesin-dextromethorphan (ROBITUSSIN DM) 100-10 MG/5ML syrup Take 10 mLs by mouth 2 (two) times daily as needed for cough.    unk  . HYDROcodone-acetaminophen (NORCO/VICODIN) 5-325 MG tablet Take 1 tablet by mouth 3 (three) times daily as needed.  0 Past Week at Unknown time  . ipratropium (ATROVENT) 0.03 % nasal spray Place 1 spray into both nostrils every 12 (twelve) hours.   02/06/2018 at Unknown time  . ipratropium-albuterol (DUONEB) 0.5-2.5 (3) MG/3ML SOLN Inhale 3 mLs into the lungs every 6 (six) hours.   02/06/2018 at Unknown time  . levothyroxine (SYNTHROID, LEVOTHROID) 175 MCG tablet Take 175 mcg by mouth daily before breakfast.   02/06/2018 at Unknown time  . mirtazapine (REMERON) 15 MG tablet Take 30 mg by mouth at bedtime.  02/05/2018 at Unknown time  . montelukast (SINGULAIR) 10 MG tablet Take 10 mg by mouth at bedtime.   02/05/2018 at Unknown time  . morphine (MS CONTIN) 15 MG 12 hr tablet Take 15 mg by mouth at bedtime.  0 Past Week at Unknown time  . Multiple Vitamins-Minerals (ICAPS AREDS 2 PO) Take 1 tablet by mouth 2 (two) times daily.   02/06/2018 at Unknown time  . omeprazole (PRILOSEC) 40 MG capsule Take 40 mg by mouth 2 (two) times daily.   02/06/2018 at  Unknown time  . ondansetron (ZOFRAN) 4 MG tablet Take 4 mg by mouth every 6 (six) hours as needed for nausea or vomiting.    unk  . pregabalin (LYRICA) 50 MG capsule Take 50 mg by mouth at bedtime.   02/05/2018 at Unknown time  . pyridostigmine (MESTINON) 60 MG tablet Take 60 mg by mouth 4 (four) times daily.    02/06/2018 at Unknown time  . rivaroxaban (XARELTO) 20 MG TABS tablet Take 20 mg by mouth daily.   02/06/2018 at 1300  . Tiotropium Bromide-Olodaterol (STIOLTO RESPIMAT) 2.5-2.5 MCG/ACT AERS Inhale 2 puffs into the lungs daily.   02/06/2018 at Unknown time  . venlafaxine (EFFEXOR) 75 MG tablet Take 225 mg by mouth daily.   02/06/2018 at 1300  . vitamin B-12 (CYANOCOBALAMIN) 1000 MCG tablet Take 1,000 mcg by mouth daily.   02/06/2018 at Unknown time   Scheduled: . aspirin EC  81 mg Oral Daily  . atorvastatin  40 mg Oral QHS  . azaTHIOprine  200 mg Oral QHS  . azelastine  2 spray Each Nare BID  . baclofen  10 mg Oral TID  . benzonatate  200 mg Oral TID  . buPROPion  150 mg Oral BID  . calcium-vitamin D  1 tablet Oral Daily  . fluticasone  1 spray Each Nare BID  . Influenza vac split quadrivalent PF  0.5 mL Intramuscular Tomorrow-1000  . ipratropium-albuterol  3 mL Inhalation Q6H  . levothyroxine  175 mcg Oral QAC breakfast  . mirtazapine  30 mg Oral QHS  . montelukast  10 mg Oral QHS  . morphine  15 mg Oral QHS  . multivitamin  1 tablet Oral BID  . nicotine  21 mg Transdermal Daily  . pantoprazole  40 mg Oral Daily  . pregabalin  50 mg Oral QHS  . pyridostigmine  60 mg Oral QID  . sodium chloride flush  10-40 mL Intracatheter Q12H  . tiotropium  18 mcg Inhalation Daily  . venlafaxine  225 mg Oral Daily  . vitamin B-12  1,000 mcg Oral Daily   Continuous: . sodium chloride 100 mL/hr at 02/09/18 2300  . ciprofloxacin Stopped (02/09/18 1656)     ROS:  As  per HPI.   Blood pressure (!) 144/74, pulse 99, temperature 98.4 F (36.9 C), temperature source Oral, resp. rate (!) 33, height _0  (1.854 m), weight 76.3 kg, SpO2 98 %.   General Examination:                                                                                                       Physical Exam  GENERAL: Awake, alert in NAD HEENT: - Normocephalic and atraumatic,  LUNGS - SOB on BiPAP CV -  equal pulses bilaterally. Ext: warm, well perfused, intact peripheral pulses, no edema  Neurological Examination Mental Status: Alert, fully oriented. Appears anxious and somewhat agitated. Unable to assess affect definitively due to face being obscured by BIPAP device. Naming intact. Speech hypophonic but fluent without errors of grammar or syntax. Able to answer all questions correctly. Able to follow all commands.  Cranial Nerves: II:  Visual fields grossly normal. PERRL.   III,IV, VI: Bilateral mild ptosis, worse on the left. EOM are full horizontally, with mildly impaired upgaze, worse on the left. No nystagmus.  V,VII: Face symmetric in the context of BIPAP mask. Facial temp sensation equal bilaterally VIII: hearing intact to voice IX,X: Unable to assess (BIPAP) XI: Head at midline XII: Unable to assess Motor: Bilateral upper extremities: 4 to 4-/5 proximally and distally with easy fatiguability.  Bilateral lower extremities: 3/5 hip flexion, 4/5 knee extension and ankle dorsiflexion. Sensory:Temp and light touch intact x 4 without extinction.  Deep Tendon Reflexes: 2+ bilateral upper extremities and patellae. 1+ achilles bilaterally.  Plantars: Right: downgoing   Left: downgoing Cerebellar: Tremulous without ataxia when performing FNF bilaterally.  Gait: Unable to assess   Lab Results: Basic Metabolic Panel: Recent Labs  Lab 02/06/18 2028  NA 138  K 4.3  CL 105  CO2 21*  GLUCOSE 105*  BUN 15  CREATININE 1.17  CALCIUM 8.5*    CBC: Recent Labs  Lab  02/06/18 2028 02/08/18 0340  WBC 13.3* 11.8*  NEUTROABS 11.6*  --   HGB 13.8 11.5*  HCT 41.4 34.3*  MCV 96.3 97.2  PLT 188 183    Cardiac Enzymes: No results for input(s): CKTOTAL, CKMB, CKMBINDEX, TROPONINI in the last 168 hours.  Lipid Panel: No results for input(s): CHOL, TRIG, HDL, CHOLHDL, VLDL, LDLCALC in the last 168 hours.  Imaging: Dg Chest 2 View  Result Date: 02/08/2018 CLINICAL DATA:  Chest pain and shortness of breath EXAM: CHEST - 2 VIEW COMPARISON:  February 06, 2018 FINDINGS: The left central line is stable. There appears to be a small bulla in the left apex. No pneumothorax. Bilateral pulmonary opacities most prominent in the mid lower lungs. No other interval changes or acute abnormalities. IMPRESSION: 1. Stable left Port-A-Cath. 2. Bilateral pulmonary opacities may represent worsening edema versus worsening bilateral pneumonia. Recommend clinical correlation and follow-up to resolution. Electronically Signed   By: Dorise Bullion III M.D   On: 02/08/2018 07:08   Ct Chest Wo Contrast  Result Date: 02/08/2018 CLINICAL DATA:  Progressive hypoxemia.  Pneumonia. EXAM: CT CHEST WITHOUT CONTRAST  TECHNIQUE: Multidetector CT imaging of the chest was performed following the standard protocol without IV contrast. COMPARISON:  Chest x-ray 02/08/2018 FINDINGS: Cardiovascular: Heart size is normal. Atherosclerotic calcifications are present coronary arteries. Atherosclerotic changes are present at the aortic arch. Pulmonary arteries mildly prominent bilaterally. No significant pericardial effusion is present. Mediastinum/Nodes: No significant mediastinal, axillary, or hilar adenopathy is present. The esophagus is within normal limits. The thoracic inlet is unremarkable. Lungs/Pleura: Extensive confluent ground-glass attenuation is present throughout the lower lobes, the lingula, and right middle lobe. Consolidation is most evident in the superior segment of the left lower lobe. Extensive  centrilobular emphysematous changes are present. No significant pleural effusion is present. Upper Abdomen: Unremarkable. Musculoskeletal: A remote superior endplate fractures present at T7. Vertebral body heights alignment are otherwise maintained. No focal lytic or blastic lesions are present. Ribs are within normal limits. IMPRESSION: 1. Extensive confluent ground-glass attenuation throughout both lungs with consolidation more prevalent in the superior segment of the left lower lobe. The findings are nonspecific, but most concerning for multi lobar pneumonia. Edema or possibly could have a similar appearance. This is to extensive for atelectasis. Consolidation in the superior segment in the left lower lobe suggests infection. 2.  Aortic Atherosclerosis (ICD10-I70.0). 3.  Emphysema (ICD10-J43.9). 4. Coronary artery disease Electronically Signed   By: San Morelle M.D.   On: 02/08/2018 11:47    Assessment: 67 year old male with myasthenia gravis crisis complicating patchy bilateral pneumonia  1. On azathioprine as an outpatient. Immunocompromise from this medication most likely is contributing to his inability to clear infection.  2. Although his NIF is mildly decreased at -40, his FVC is severely decreased at 0.65 L. He also has asymmetric ptosis, weakness of upgaze and diffuse upper extremity weakness. He is using abdominal accessory muscles of respiration despite BIPAP. These findings and clinical history are most consistent with myasthenia gravis exacerbation leading to MG crisis, complicating his patchy bilateral pneumonia   3. Endorses recent excessive secretions suggestive possibly of a mild overlapping cholinergic crisis brought on by his Mestinon, the dosage of which was recently increased, per patient.  4. Cannot be treated with IVIG due to prior complication of bilateral PE.  5. CT chest report does not describe an enlarged thymus gland. Of note, he states that he has never had a  thymectomy.   Recommendations: 1. There is an indication for plasma exchange (PLEX), as although his NIF is mildly decreased at -40, his FVC is severely decreased at 0.65 L. He also has asymmetric ptosis, weakness of upgaze and diffuse upper extremity weakness. He is using abdominal accessory muscles of respiration despite BIPAP.  2. Would consider holding azathioprine for a period of weeks to months in order to increase the likelihood that he will be able to clear his infection once organism is identified and optimal antibiotic regimen prescribed. If he has gradual worsening during this period of time, he may need to have scheduled PLEX treatments until he can be restarted on an immune-suppressant (e.g. azathioprine) or immune-modulating agent (e.g. Cellcept) for his MG.  3. Decrease Mestinon by 50% to 30 mg QID.  4. Respiratory to continue to assess NIF and FVC at regular intervals.   5. Avoid medications, especially select antibiotics, known to exacerbate myasthenia gravis.  6. Smoking cessation counseling.  7. Discussed with ICU team.   50 minutes spent in the neurological evaluation and management of this critically ill patient  Electronically signed: Dr. Kerney Elbe 02/09/2018, 7:40 PM

## 2018-02-09 NOTE — Progress Notes (Signed)
RT took patient off BIPAP and placed on 3L O2 for NIF/VC test. Patient got -40 on NIF, and .650L on VC.

## 2018-02-10 ENCOUNTER — Encounter (HOSPITAL_COMMUNITY)
Admission: EM | Disposition: A | Discharge status: Home Health | Payer: Self-pay | Source: Home / Self Care | Attending: Pulmonary Disease

## 2018-02-10 ENCOUNTER — Inpatient Hospital Stay (HOSPITAL_COMMUNITY): Payer: Medicare HMO

## 2018-02-10 ENCOUNTER — Encounter (HOSPITAL_COMMUNITY): Payer: Medicare HMO

## 2018-02-10 DIAGNOSIS — I2699 Other pulmonary embolism without acute cor pulmonale: Secondary | ICD-10-CM

## 2018-02-10 DIAGNOSIS — J189 Pneumonia, unspecified organism: Secondary | ICD-10-CM

## 2018-02-10 DIAGNOSIS — R918 Other nonspecific abnormal finding of lung field: Secondary | ICD-10-CM

## 2018-02-10 DIAGNOSIS — Z720 Tobacco use: Secondary | ICD-10-CM

## 2018-02-10 DIAGNOSIS — A419 Sepsis, unspecified organism: Secondary | ICD-10-CM

## 2018-02-10 LAB — HEPARIN LEVEL (UNFRACTIONATED): Heparin Unfractionated: 0.2 IU/mL — ABNORMAL LOW (ref 0.30–0.70)

## 2018-02-10 LAB — CBC WITH DIFFERENTIAL/PLATELET
Abs Immature Granulocytes: 0.1 10*3/uL (ref 0.0–0.1)
BASOS ABS: 0 10*3/uL (ref 0.0–0.1)
Basophils Relative: 0 %
EOS ABS: 0.3 10*3/uL (ref 0.0–0.7)
Eosinophils Relative: 4 %
HCT: 33.8 % — ABNORMAL LOW (ref 39.0–52.0)
Hemoglobin: 11.4 g/dL — ABNORMAL LOW (ref 13.0–17.0)
Immature Granulocytes: 1 %
Lymphocytes Relative: 10 %
Lymphs Abs: 1 10*3/uL (ref 0.7–4.0)
MCH: 32.9 pg (ref 26.0–34.0)
MCHC: 33.7 g/dL (ref 30.0–36.0)
MCV: 97.7 fL (ref 78.0–100.0)
Monocytes Absolute: 0.6 10*3/uL (ref 0.1–1.0)
Monocytes Relative: 6 %
Neutro Abs: 7.3 10*3/uL (ref 1.7–7.7)
Neutrophils Relative %: 79 %
Platelets: 226 10*3/uL (ref 150–400)
RBC: 3.46 MIL/uL — ABNORMAL LOW (ref 4.22–5.81)
RDW: 13 % (ref 11.5–15.5)
WBC: 9.2 10*3/uL (ref 4.0–10.5)

## 2018-02-10 LAB — SEDIMENTATION RATE: Sed Rate: 127 mm/hr — ABNORMAL HIGH (ref 0–16)

## 2018-02-10 LAB — COMPREHENSIVE METABOLIC PANEL
ALK PHOS: 110 U/L (ref 38–126)
ALT: 29 U/L (ref 0–44)
AST: 36 U/L (ref 15–41)
Albumin: 2 g/dL — ABNORMAL LOW (ref 3.5–5.0)
Anion gap: 10 (ref 5–15)
BUN: 7 mg/dL — ABNORMAL LOW (ref 8–23)
CALCIUM: 8.1 mg/dL — AB (ref 8.9–10.3)
CO2: 26 mmol/L (ref 22–32)
Chloride: 102 mmol/L (ref 98–111)
Creatinine, Ser: 0.89 mg/dL (ref 0.61–1.24)
Glucose, Bld: 112 mg/dL — ABNORMAL HIGH (ref 70–99)
Potassium: 4.1 mmol/L (ref 3.5–5.1)
Sodium: 138 mmol/L (ref 135–145)
Total Bilirubin: 0.8 mg/dL (ref 0.3–1.2)
Total Protein: 5.4 g/dL — ABNORMAL LOW (ref 6.5–8.1)

## 2018-02-10 LAB — C-REACTIVE PROTEIN: CRP: 36.1 mg/dL — ABNORMAL HIGH (ref <1.0)

## 2018-02-10 LAB — HISTOPLASMA ANTIGEN, URINE: Histoplasma Antigen, urine: <0.5 (ref <0.5)

## 2018-02-10 LAB — LACTATE DEHYDROGENASE: LDH: 311 U/L — AB (ref 98–192)

## 2018-02-10 LAB — APTT: aPTT: 41 seconds — ABNORMAL HIGH (ref 24–36)

## 2018-02-10 SURGERY — VIDEO BRONCHOSCOPY WITHOUT FLUORO
Anesthesia: Moderate Sedation | Laterality: Bilateral

## 2018-02-10 MED ORDER — ORAL CARE MOUTH RINSE
15.0000 mL | Freq: Two times a day (BID) | OROMUCOSAL | Status: DC
  Administered 2018-02-10 – 2018-02-12 (×4): 15 mL via OROMUCOSAL

## 2018-02-10 MED ORDER — HEPARIN (PORCINE) IN NACL 100-0.45 UNIT/ML-% IJ SOLN
1200.0000 [IU]/h | INTRAMUSCULAR | Status: AC
  Administered 2018-02-10: 1200 [IU]/h via INTRAVENOUS
  Filled 2018-02-10: qty 250

## 2018-02-10 MED ORDER — PYRIDOSTIGMINE BROMIDE 60 MG PO TABS
30.0000 mg | ORAL_TABLET | Freq: Four times a day (QID) | ORAL | Status: DC
  Administered 2018-02-10 – 2018-02-11 (×6): 30 mg via ORAL
  Filled 2018-02-10 (×10): qty 0.5

## 2018-02-10 MED ORDER — CHLORHEXIDINE GLUCONATE 0.12 % MT SOLN
15.0000 mL | Freq: Two times a day (BID) | OROMUCOSAL | Status: DC
  Administered 2018-02-10 – 2018-02-12 (×5): 15 mL via OROMUCOSAL
  Filled 2018-02-10 (×6): qty 15

## 2018-02-10 NOTE — Progress Notes (Signed)
[facility] for Infectious Disease  Date of Admission:  02/06/2018   Total days of antibiotics 5        Day 2 ciprofloxacin         ASSESSMENT: 67 year-old male, immunocompromised, with atypical pneumonia. Differential is still broad. We still suspect this is due to F. Tularensis and have started ciprofloxacin. This may also be PCP pneumonia given ground-glass infiltrates and elevated LDH, and pulmonary intensivist will send sputum PCP DFA. Also on the differential is Lymphoid interstitial pneumonia (form of ILD), a known complication of Sjogren's, which can present like this with weight loss, pleuritic chest pain, and fatigue. We would really like to have BAL, but Bronchoscopy could not be done today due to risk of bleeding from Xarelto. It is scheduled for tomorrow. We will continue ciprofloxacin even though it is known to exacerbate myathenia gravis because of the potential for Tularemia. Tularemia is also treated with aminoglycosides and tetracyclines, which also worsen MG.   PLAN: 1. Continue ciprofloxacin 2. Appreciate pulmonology and neuro input 3. BAL on 9/11: routine bacterial culture supplemented with cysteine (required for F. tularensis to grow), AFB culture and stain, fungal culture and stain, BCYE agar culture (for legionella and nocardia), gram stain, cell count. Growth is optimal at 35C. 4. F/u BCx, histoplasma Ag, PCP DFA  Principal Problem:   Sepsis ([place]) Active Problems:   CAD (coronary artery disease)   COPD (chronic obstructive pulmonary disease) ([facility])   Depression with anxiety   GERD (gastroesophageal reflux disease)   Hypothyroidism   Myasthenia gravis ([place])   PE (pulmonary thromboembolism) ([facility])   Tobacco abuse   Lobar pneumonia ([facility])   Scheduled Meds: . aspirin EC  81 mg Oral Daily  . atorvastatin  40 mg Oral QHS  . azaTHIOprine  200 mg Oral QHS  . azelastine  2 spray Each Nare BID  . baclofen  10 mg Oral TID  . benzonatate  200 mg Oral TID    . buPROPion  150 mg Oral BID  . calcium-vitamin D  1 tablet Oral Daily  . chlorhexidine  15 mL Mouth Rinse BID  . fluticasone  1 spray Each Nare BID  . Influenza vac split quadrivalent PF  0.5 mL Intramuscular Tomorrow-1000  . ipratropium-albuterol  3 mL Inhalation Q6H  . levothyroxine  175 mcg Oral QAC breakfast  . mouth rinse  15 mL Mouth Rinse q12n4p  . mirtazapine  30 mg Oral QHS  . montelukast  10 mg Oral QHS  . morphine  15 mg Oral QHS  . multivitamin  1 tablet Oral BID  . nicotine  21 mg Transdermal Daily  . pantoprazole  40 mg Oral Daily  . pregabalin  50 mg Oral QHS  . pyridostigmine  30 mg Oral QID  . sodium chloride flush  10-40 mL Intracatheter Q12H  . tiotropium  18 mcg Inhalation Daily  . venlafaxine  225 mg Oral Daily  . vitamin B-12  1,000 mcg Oral Daily   Continuous Infusions: . sodium chloride 100 mL/hr at 02/10/18 1254  . ciprofloxacin Stopped (02/10/18 0504)  . heparin     PRN Meds:.acetaminophen, albuterol, guaiFENesin-dextromethorphan, hydrALAZINE, HYDROcodone-acetaminophen, ondansetron, sodium chloride flush, zolpidem   SUBJECTIVE: Mr. Sievers is feeling better this morning. He is still coughing with productive blood-tinged sputum. He is still feeling short of breath. No other complaints. He reports his home hospitals are the [facility] in [place], [place], and [facility] in [place]  [facility] in the [facility] city.     Review of Systems: Review of Systems  Constitutional: Negative for chills, diaphoresis and fever.  Respiratory: Positive for cough, hemoptysis, sputum production and shortness of breath.   Gastrointestinal: Negative.   Neurological: Negative.     Allergies  Allergen Reactions  . Hydromorphone Other (See Comments)    hallucinations   . Immune Globulin Other (See Comments)     Bilateral pulmonary embolism      OBJECTIVE: Vitals:   02/10/18 1000 02/10/18 1100 02/10/18 1122 02/10/18 1200  BP: 139/75 130/72  128/79   Pulse: 89 94  100  Resp: (!) 26 (!) 37  (!) 43  Temp:   98.3 F (36.8 C)   TempSrc:   Oral   SpO2: 100% 99%  99%  Weight:      Height:       Body mass index is 22.22 kg/m.  Physical Exam  Constitutional: He is oriented to person, place, and time. He appears ill. He appears distressed.  Cardiovascular: Normal rate, regular rhythm and normal heart sounds.  No murmur heard. Pulmonary/Chest: Accessory muscle usage present. Tachypnea noted. He is in respiratory distress. He has rales in the right middle field, the right lower field, the left middle field and the left lower field.  Abdominal: Soft. Bowel sounds are normal. There is no tenderness.  Neurological: He is alert and oriented to person, place, and time.  Skin: Skin is warm.    Lab Results Lab Results  Component Value Date   WBC 9.2 02/10/2018   HGB 11.4 (L) 02/10/2018   HCT 33.8 (L) 02/10/2018   MCV 97.7 02/10/2018   PLT 226 02/10/2018    Lab Results  Component Value Date   CREATININE 0.89 02/10/2018   BUN 7 (L) 02/10/2018   NA 138 02/10/2018   K 4.1 02/10/2018   CL 102 02/10/2018   CO2 26 02/10/2018    Lab Results  Component Value Date   ALT 29 02/10/2018   AST 36 02/10/2018   ALKPHOS 110 02/10/2018   BILITOT 0.8 02/10/2018     Microbiology: Recent Results (from the past 240 hour(s))  Blood culture (routine x 2)     Status: None (Preliminary result)   Collection Time: 02/06/18 11:35 PM  Result Value Ref Range Status   Specimen Description BLOOD RIGHT ANTECUBITAL  Final   Special Requests   Final    BOTTLES DRAWN AEROBIC AND ANAEROBIC Blood Culture adequate volume   Culture   Final    NO GROWTH 3 DAYS Performed at [facility], [place] [address], [place], [place] [address]    Report Status PENDING  Incomplete  Blood culture (routine x 2)     Status: None (Preliminary result)   Collection Time: 02/06/18 11:45 PM  Result Value Ref Range Status   Specimen Description BLOOD LEFT HAND  Final    Special Requests   Final    BOTTLES DRAWN AEROBIC AND ANAEROBIC Blood Culture adequate volume   Culture   Final    NO GROWTH 3 DAYS Performed at [facility], [place] [address], [place], [place] [address]    Report Status PENDING  Incomplete  Respiratory Panel by PCR     Status: None   Collection Time: 02/07/18  8:24 AM  Result Value Ref Range Status   Adenovirus NOT DETECTED NOT DETECTED Final   Coronavirus 229E NOT DETECTED NOT DETECTED Final   Coronavirus HKU1 NOT DETECTED NOT DETECTED Final  Coronavirus NL63 NOT DETECTED NOT DETECTED Final   Coronavirus OC43 NOT DETECTED NOT DETECTED Final   Metapneumovirus NOT DETECTED NOT DETECTED Final   Rhinovirus / Enterovirus NOT DETECTED NOT DETECTED Final   Influenza A NOT DETECTED NOT DETECTED Final   Influenza B NOT DETECTED NOT DETECTED Final   Parainfluenza Virus 1 NOT DETECTED NOT DETECTED Final   Parainfluenza Virus 2 NOT DETECTED NOT DETECTED Final   Parainfluenza Virus 3 NOT DETECTED NOT DETECTED Final   Parainfluenza Virus 4 NOT DETECTED NOT DETECTED Final   Respiratory Syncytial Virus NOT DETECTED NOT DETECTED Final   Bordetella pertussis NOT DETECTED NOT DETECTED Final   Chlamydophila pneumoniae NOT DETECTED NOT DETECTED Final   Mycoplasma pneumoniae NOT DETECTED NOT DETECTED Final    Comment: Performed at [facility], [place] [address], [place], [place] [address]  MRSA PCR Screening     Status: None   Collection Time: 02/09/18  2:56 PM  Result Value Ref Range Status   MRSA by PCR NEGATIVE NEGATIVE Final    Comment:        The GeneXpert MRSA Assay (FDA approved for NASAL specimens only), is one component of a comprehensive MRSA colonization surveillance program. It is not intended to diagnose MRSA infection nor to guide or monitor treatment for MRSA infections. Performed at [facility], [place] [address], [place],  [address]     Durenda Hurt, [facility] for Infectious  [facility] [phone] pager   02/10/2018, 1:44 PM

## 2018-02-10 NOTE — Progress Notes (Signed)
TRH sign off note  I saw the patient yesterday, consulted ID, PCCM and Neurology.  Patient was then transferred to ICU for close monitoring in case his respiratory status should decline due to respiratory muscle fatigue from myasthenia gravis in which case he would need mechanical ventilatory support.  I discussed with Dr. Craige Cotta, PCCM this morning and he has kindly taken over complete care of this patient onto his service.  TRH will sign off at this time.  Please consult [place] back for any further assistance.  Marcellus Scott, MD, FACP, [facility]. [facility] Pager [phone]  If 7PM-7AM, please contact night-coverage [email or web address] Password [email or web address] 02/10/2018, 2:05 PM

## 2018-02-10 NOTE — Progress Notes (Signed)
RT Fannie Knee called for assistance, new orders for pt to be placed on Bipap and collect an ABG. On arrival pt sitting upright in bed, skin warm and dry, using accessory muscles, BBS crackles, very anxious. Dr. Craige Cotta with PCCM had been to bedside to evaluate pt. I assisted with transfer to 2H05. No interventions from RRT

## 2018-02-10 NOTE — Progress Notes (Signed)
VC: 2.4L ( Patient began to cough before exhaling all air on both attempts)  NIF: Greater than -40 with good effort.

## 2018-02-10 NOTE — Progress Notes (Signed)
Neurology Progress Note   S:// Patient is seen and examined in the ICU. Was receiving breathing treatment at the time. Was able to provide coherent history and was cooperative with exam.   O:// Current vital signs: BP 123/61   Pulse 99   Temp 98.6 F (37 C) (Oral)   Resp (!) 24   Ht 6\' 1"  (1.854 m)   Wt 76.4 kg   SpO2 95%   BMI 22.22 kg/m  Vital signs in last 24 hours: Temp:  [98.4 F (36.9 C)-99.8 F (37.7 C)] 98.6 F (37 C) (09/10 0753) Pulse Rate:  [75-101] 99 (09/10 0759) Resp:  [19-34] 24 (09/10 0759) BP: (103-144)/(58-82) 123/61 (09/10 0759) SpO2:  [89 %-99 %] 95 % (09/10 0759) FiO2 (%):  [40 %] 40 % (09/10 0115) Weight:  [76.4 kg] 76.4 kg (09/10 0500) GENERAL: Awake, alert in NAD HEENT: - Normocephalic and atraumatic, dry mm, no LN++, no Thyromegally LUNGS -scattered rales, on BiPAP all night CV - S1S2 RRR, no m/r/g, equal pulses bilaterally. ABDOMEN - Soft, nontender, nondistended with normoactive BS Ext: warm, well perfused, intact peripheral pulses, no edema NEURO:  Mental Status: AA&Ox3  Language: speech is not dysarthric.  Naming, repetition, fluency, and comprehension intact. Cranial Nerves: PERRL, left ptosis more prominent than right ptosis.  Extraocular movements are full horizontally but mildly impaired on upward gaze with mild disconjugate gaze in all directions and diplopia on and gazes in each direction, visual fields full, no facial asymmetry, facial sensation intact, hearing intact, tongue/uvula/soft palate midline, normal sternocleidomastoid and trapezius muscle strength. No evidence of tongue atrophy or fibrillations Motor: Bilateral upper extremities are 4/5 with easy fatigability.  Bilateral lower extremities are 4/5 also with easy fatigability. Tone: is normal and bulk is normal Sensation- Intact to light touch bilaterally Coordination: FTN intact bilaterally, no ataxia in BLE. Gait- deferred  Medications  Current Facility-Administered  Medications:  .  0.9 %  sodium chloride infusion, , Intravenous, Continuous, Lorretta Harp, MD, Last Rate: 100 mL/hr at 02/10/18 0600 .  acetaminophen (TYLENOL) tablet 650 mg, 650 mg, Oral, Q6H PRN, Lorretta Harp, MD .  albuterol (PROVENTIL) (2.5 MG/3ML) 0.083% nebulizer solution 2.5 mg, 2.5 mg, Nebulization, Q4H PRN, Lorretta Harp, MD, 2.5 mg at 02/09/18 1048 .  aspirin EC tablet 81 mg, 81 mg, Oral, Daily, Lorretta Harp, MD, 81 mg at 02/09/18 1032 .  atorvastatin (LIPITOR) tablet 40 mg, 40 mg, Oral, QHS, Lorretta Harp, MD, 40 mg at 02/09/18 2125 .  azathioprine (IMURAN) tablet 200 mg, 200 mg, Oral, QHS, Lorretta Harp, MD, 200 mg at 02/09/18 2128 .  azelastine (ASTELIN) 0.1 % nasal spray 2 spray, 2 spray, Each Nare, BID, Lorretta Harp, MD, 2 spray at 02/09/18 2153 .  baclofen (LIORESAL) tablet 10 mg, 10 mg, Oral, TID, Lorretta Harp, MD, 10 mg at 02/09/18 2128 .  benzonatate (TESSALON) capsule 200 mg, 200 mg, Oral, TID, Hongalgi, Anand D, MD, 200 mg at 02/09/18 2129 .  buPROPion [facility] SR) 12 hr tablet 150 mg, 150 mg, Oral, BID, Lorretta Harp, MD, 150 mg at 02/09/18 2128 .  calcium-vitamin D (OSCAL WITH D) 500-200 MG-UNIT per tablet 1 tablet, 1 tablet, Oral, Daily, Lorretta Harp, MD, 1 tablet at 02/09/18 1032 .  ciprofloxacin (CIPRO) IVPB 400 mg, 400 mg, Intravenous, Q12H, Daiva Eves, Lisette Grinder, MD, Stopped at 02/10/18 [phone]  fluticasone (FLONASE) 50 MCG/ACT nasal spray 1 spray, 1 spray, Each Nare, BID, Lorretta Harp, MD, 1 spray at 02/09/18 2153 .  guaiFENesin-dextromethorphan (ROBITUSSIN DM) 100-10  MG/5ML syrup 10 mL, 10 mL, Oral, Q4H PRN, Hongalgi, Anand D, MD .  hydrALAZINE (APRESOLINE) injection 5 mg, 5 mg, Intravenous, Q2H PRN, Lorretta Harp, MD .  HYDROcodone-acetaminophen (NORCO/VICODIN) 5-325 MG per tablet 1 tablet, 1 tablet, Oral, TID PRN, Lorretta Harp, MD, 1 tablet at 02/09/18 0425 .  Influenza vac split quadrivalent PF (FLUZONE HIGH-DOSE) injection 0.5 mL, 0.5 mL, Intramuscular, Tomorrow-1000, Sheehan, Theresa C, MD .   ipratropium-albuterol (DUONEB) 0.5-2.5 (3) MG/3ML nebulizer solution 3 mL, 3 mL, Inhalation, Q6H, Lorretta Harp, MD, 3 mL at 02/10/18 0759 .  levothyroxine (SYNTHROID, LEVOTHROID) tablet 175 mcg, 175 mcg, Oral, QAC breakfast, Lorretta Harp, MD, 175 mcg at 02/10/18 0827 .  mirtazapine (REMERON) tablet 30 mg, 30 mg, Oral, QHS, Lorretta Harp, MD, 30 mg at 02/09/18 2126 .  montelukast (SINGULAIR) tablet 10 mg, 10 mg, Oral, QHS, Lorretta Harp, MD, 10 mg at 02/09/18 2130 .  morphine (MS CONTIN) 12 hr tablet 15 mg, 15 mg, Oral, QHS, Lorretta Harp, MD, 15 mg at 02/09/18 2127 .  multivitamin (PROSIGHT) tablet 1 tablet, 1 tablet, Oral, BID, Lorretta Harp, MD, 1 tablet at 02/09/18 2127 .  nicotine (NICODERM CQ - dosed in mg/24 hours) patch 21 mg, 21 mg, Transdermal, Daily, Lorretta Harp, MD, 21 mg at 02/09/18 1032 .  ondansetron (ZOFRAN) tablet 4 mg, 4 mg, Oral, Q6H PRN, Lorretta Harp, MD .  pantoprazole (PROTONIX) EC tablet 40 mg, 40 mg, Oral, Daily, Lorretta Harp, MD, 40 mg at 02/09/18 1032 .  pregabalin (LYRICA) capsule 50 mg, 50 mg, Oral, QHS, Lorretta Harp, MD, 50 mg at 02/09/18 2130 .  pyridostigmine (MESTINON) tablet 30 mg, 30 mg, Oral, QID, Caryl Pina, MD .  sodium chloride flush (NS) 0.9 % injection 10-40 mL, 10-40 mL, Intracatheter, Q12H, Lahoma Crocker, MD, 10 mL at 02/08/18 2228 .  sodium chloride flush (NS) 0.9 % injection 10-40 mL, 10-40 mL, Intracatheter, PRN, Lahoma Crocker, MD .  tiotropium [facility]) inhalation capsule 18 mcg, 18 mcg, Inhalation, Daily, Lorretta Harp, MD, 18 mcg at 02/10/18 0759 .  venlafaxine (EFFEXOR) tablet 225 mg, 225 mg, Oral, Daily, Lorretta Harp, MD, 225 mg at 02/09/18 1034 .  vitamin B-12 (CYANOCOBALAMIN) tablet 1,000 mcg, 1,000 mcg, Oral, Daily, Lorretta Harp, MD, 1,000 mcg at 02/09/18 1032 .  zolpidem (AMBIEN) tablet 5 mg, 5 mg, Oral, QHS PRN, Lorretta Harp, MD Labs CBC    Component Value Date/Time   WBC 9.2 02/10/2018 0359   RBC 3.46 (L) 02/10/2018 0359   HGB 11.4 (L) 02/10/2018 0359   HCT  33.8 (L) 02/10/2018 0359   PLT 226 02/10/2018 0359   MCV 97.7 02/10/2018 0359   MCH 32.9 02/10/2018 0359   MCHC 33.7 02/10/2018 0359   RDW 13.0 02/10/2018 0359   LYMPHSABS 1.0 02/10/2018 0359   MONOABS 0.6 02/10/2018 0359   EOSABS 0.3 02/10/2018 0359   BASOSABS 0.0 02/10/2018 0359    CMP     Component Value Date/Time   NA 138 02/10/2018 0359   K 4.1 02/10/2018 0359   CL 102 02/10/2018 0359   CO2 26 02/10/2018 0359   GLUCOSE 112 (H) 02/10/2018 0359   BUN 7 (L) 02/10/2018 0359   CREATININE 0.89 02/10/2018 0359   CALCIUM 8.1 (L) 02/10/2018 0359   PROT 5.4 (L) 02/10/2018 0359   ALBUMIN 2.0 (L) 02/10/2018 0359   AST 36 02/10/2018 0359   ALT 29 02/10/2018 0359   ALKPHOS 110 02/10/2018 0359   BILITOT 0.8 02/10/2018 0359   GFRNONAA >60 02/10/2018  1610   GFRAA >60 02/10/2018 0359   Imaging I have reviewed images in epic and the results pertinent to this consultation are: No CNS imaging to review  Assessment:  67 year old man with possible myasthenic crisis in the setting of complicated patchy bilateral pneumonia. He has had multiple hospitalizations for treatment of this pneumonia and has had somewhat of a resistant course of treatment of the pneumonia. Currently with increasing difficulty breathing and increasing work of breathing that required him to be moved from the hospital floor to the intensive care unit for respiratory support. His most recent NIF is greater than -40 and vital capacity is 3 L. At this time, he is not at a point where he needs emergent intubation. The fact that he has been on chronic immunosuppression with Imuran, and been on Mestinon which might have caused cholinergic effects including increasing secretions, might have contributed to the treatment resistant or recurrent pneumonias. At this time, I think it is prudent to take him off of Imuran for that short term. I have attempted to reach his primary neurologist in [facility], and awaiting a  callback at this time.   Impression: Complicated pneumonia-likely secondary to immune suppression Possible myasthenic exacerbation as well as cholinergic crisis  Recommendations: -Continue with his respiratory checks every 6 to daily 8 hours. - If his NIF is below -20, and FVC's fall below 30cc/kg, can consider intubation for respiratory support at that time. - At this point, it is going to be complicated to do plasma exchange as he is also on anticoagulation for his PE, which would also preclude him from IVIG as that can be thrombophilic and is not indicated in patients with PE/DVT. - If he continues to worsen in terms of his respiratory status, I will recommend doing plasma exchange at that time.  Until then, I would treat him with antibiotics, provide respiratory support as well as continue with his Mestinon 30 mg 4 times a day, which is the half of his prior dose, and respiratory checks. -Keep off of Imuran - We will attempt to touch base with the outpatient neurologist who has taken care of him for many years to see if they have any input in his current condition.  -- Milon Dikes, MD [facility] Pager: [phone] If 7pm to 7am, please call on call as listed on AMION.   CRITICAL CARE ATTESTATION This patient is critically ill and at significant risk of neurological worsening, death and care requires constant monitoring of vital signs, hemodynamics,respiratory and cardiac monitoring. I spent 30  minutes of neurocritical care time performing neurological assessment, discussion with family, other specialists and medical decision making of high complexityin the care of  this patient.

## 2018-02-10 NOTE — Progress Notes (Signed)
ANTICOAGULATION CONSULT NOTE - Initial Consult  Pharmacy Consult for Heparin Indication: history of pulmonary embolus  Allergies  Allergen Reactions  . Hydromorphone Other (See Comments)    hallucinations   . Immune Globulin Other (See Comments)     Bilateral pulmonary embolism      Patient Measurements: Height: 6\' 1"  (185.4 cm) Weight: 168 lb 6.9 oz (76.4 kg) IBW/kg (Calculated) : 79.9 HEPARIN DW (KG): 76.3   Vital Signs: Temp: 98.6 F (37 C) (09/10 0753) Temp Source: Oral (09/10 0753) BP: 135/71 (09/10 0900) Pulse Rate: 86 (09/10 0900)  Labs: Recent Labs    02/08/18 0340 02/10/18 0359  HGB 11.5* 11.4*  HCT 34.3* 33.8*  PLT 183 226  CREATININE  --  0.89    Estimated Creatinine Clearance: 84.7 mL/min (by C-G formula based on SCr of 0.89 mg/dL).   Medical History: Past Medical History:  Diagnosis Date  . Bronchiectasis (HCC)   . CAD (coronary artery disease)   . COPD (chronic obstructive pulmonary disease) (HCC)   . Depression with anxiety   . GERD (gastroesophageal reflux disease)   . Hypothyroidism   . Myasthenia gravis (HCC)   . PE (pulmonary thromboembolism) (HCC)   . Sjogren's disease (HCC)   . Tobacco abuse     Medications:  Scheduled:  . aspirin EC  81 mg Oral Daily  . atorvastatin  40 mg Oral QHS  . azaTHIOprine  200 mg Oral QHS  . azelastine  2 spray Each Nare BID  . baclofen  10 mg Oral TID  . benzonatate  200 mg Oral TID  . buPROPion  150 mg Oral BID  . calcium-vitamin D  1 tablet Oral Daily  . chlorhexidine  15 mL Mouth Rinse BID  . fluticasone  1 spray Each Nare BID  . Influenza vac split quadrivalent PF  0.5 mL Intramuscular Tomorrow-1000  . ipratropium-albuterol  3 mL Inhalation Q6H  . levothyroxine  175 mcg Oral QAC breakfast  . mouth rinse  15 mL Mouth Rinse q12n4p  . mirtazapine  30 mg Oral QHS  . montelukast  10 mg Oral QHS  . morphine  15 mg Oral QHS  . multivitamin  1 tablet Oral BID  . nicotine  21 mg Transdermal Daily   . pantoprazole  40 mg Oral Daily  . pregabalin  50 mg Oral QHS  . pyridostigmine  30 mg Oral QID  . sodium chloride flush  10-40 mL Intracatheter Q12H  . tiotropium  18 mcg Inhalation Daily  . venlafaxine  225 mg Oral Daily  . vitamin B-12  1,000 mcg Oral Daily   Infusions:  . sodium chloride 100 mL/hr at 02/10/18 0600  . ciprofloxacin Stopped (02/10/18 0504)    Assessment: Pt is a 30 yoM with a history of PE 10/2016. Pt was on rivaroxaban PTA, last dose 9/9. Patient started on heparin gtt in the interim. Given last dose of rivaroxaban, will plan to start heparin gtt later this afternoon. Baseline hemoglobin stable.     Goal of Therapy:  Heparin level 0.3-0.7 units/ml Monitor platelets by anticoagulation protocol: Yes   Plan:  - Start heparin infusion at 1200 units/hr - Turn off heparin drip on 9/11 @6  AM, broch planned for 11 AM - Obtain baseline aPTT/HL  - Will not check level due to short infusion time. In future, would check aPTT given recent rivaroxaban use - Continue to monitor H&H and platelets  Marcelino Freestone, PharmD PGY2 Cardiology Pharmacy Resident Phone [phone] 02/10/2018 10:43 AM

## 2018-02-10 NOTE — Progress Notes (Signed)
RT did NIF and VC with patient. Patient got -40 NIF and 2.7L VC. Patient tolerating well with good effort.

## 2018-02-10 NOTE — Progress Notes (Signed)
Curtis Preston  YYT:[id number] DOB: [birth year] DOA: 02/06/2018 PCP: No primary care provider on file.    LOS: 2 days   Reason for Consult / Chief Complaint:  Pneumonia in immunocompromised patient.   Consulting MD and date of consult:  Dr. Waymon Amato  HPI/summary of hospital stay:  67 year old male with PMH as below, which is significant for Myasthenia gravis on imuran, COPD, PE on Xarelto, and CAD. He was in his usual state of health (gets winded daily) until about early August of this year, when he developed worsening shortness of breath with associated productive cough (green sputum). This persisted for about 6 weeks, until about 9/6, when his symptoms significant worsened after traveling here to [place] from [place] (where he lives). SOB worsened as did his productive cough, which caused him to present to ED. Workup in ED suggested pneumonia and he was started on empiric antibiotics. CT demonstrated bilateral ground glass, and most concerning for multi-focal pneumonia. Infectious disease consultation sees benefit in bronchoscopy for culture. PCCM consulted and recommended transfer to the intensive care unit for monitoring of respiratory status.  Subjective  Patient has no complaints this morning.  He states that he is feeling much better.  Case discussed with neurology this morning.  Vital capacity of 3 L left greater than -20 this morning.  Assessment & Plan:   Acute hypoxemic respiratory failure secondary to community acquired pneumonia in an immunosuppressed patient, possible atypical organism - CT imaging with BL GGO and multiple intraparenchymal cyst/pneumatocele formation, LDH 311, would consider the diagnosis of PJP pneumonia  - LIP would be in the differential as well, BL GGO with cyst formation in a patient with Sjogrens syndrome  - Continue antimicrobial regimen per infectious disease - will send sputum for PCP DFA in the meantime  - Patient was on Xarelto which has been  held since yesterday (last dose 9/9).   - This has postponed his bronchoscopy plans for today.  - We will start patient on heparin drip in the meantime to start this evening. Discussed with pharmacy.  - Plan for possible inpatient bronchoscopy tomorrow.   - This was discussed with Dr. Craige Cotta who saw the patient yesterday. - Bronch at 11AM tomorrow - Hold heparin at 6AM  - NPO midnight   Myasthenia Gravis, with possible exacerbation  - management at this point per neurology - agree that with history of PE, IVIG would increase his risk of recurrent VTE  - plasma exchange may be the best option  - He will need an HD cath and CCM can place this if needed  - Recommend increasing the frequency of NIF/VC checks to q6 Hours  - if NIF falls below -20 or VC <30cc/kg for him would be less than 2.2L then we will consider intubation and mechanical support. - the ciprofloxacin will likely continue to exacerbate his MG - would avoid if possible    Hemoptysis - stable, improved  COPD - continue scheduled duonebs    Consultants: date of consult/date signed off (if applicable)/final recs  ID  Procedures:  Significant Diagnostic Tests: CT chest 9/9 > Extensive confluent ground-glass attenuation throughout both lungs with consolidation more prevalent in the superior segment of the left lower lobe. The findings are nonspecific, but most concerning for multi lobar pneumonia. Edema or possibly could have a similar appearance. This is to extensive for atelectasis. Consolidation in the superior segment in the left lower lobe suggests infection.  Micro Data: Blood 9/6 > Sputum  9/6 > RVP 9/7: Negative  Antimicrobials:  Azithromycin 9/6 > 9/9 Cefepime 9/6 > 9/9  Vancomycin 9/6 > 9/9 Ciprofloxacin 9/9 >  Objective    Examination: General appearance: 67 y.o., male, NAD, conversant, apperars comfortable in bed  Eyes: anicteric sclerae, moist conjunctivae; tracking appropriately  HENT: NCAT;  oropharynx, MMM, no mucosal ulcerations; Neck: Trachea midline; no JVD  Lungs: BL rhonchi, no wheeze  CV: RRR, S1, S2, distant heart tones, no overt murmur  Abdomen: Soft, non-tender; non-distended, BS present  Extremities: No peripheral edema or radial and DP pulses present bilaterally  Skin: Normal temperature, turgor and texture; no rash Psych: Appropriate affect, normal mood  Neuro: Alert and oriented to person and place, no focal deficit    Blood pressure 135/71, pulse 86, temperature 98.6 F (37 C), temperature source Oral, resp. rate (!) 37, height 6\' 1"  (1.854 m), weight 76.4 kg, SpO2 100 %.    FiO2 (%):  [40 %] 40 %   Intake/Output Summary (Last 24 hours) at 02/10/2018 1012 Last data filed at 02/10/2018 0803 Gross per 24 hour  Intake 1613.05 ml  Output 2351 ml  Net -737.95 ml   Filed Weights   02/08/18 0526 02/09/18 0606 02/10/18 0500  Weight: 76.2 kg 76.3 kg 76.4 kg   Labs    CBC: Recent Labs  Lab 02/06/18 2028 02/08/18 0340 02/10/18 0359  WBC 13.3* 11.8* 9.2  NEUTROABS 11.6*  --  7.3  HGB 13.8 11.5* 11.4*  HCT 41.4 34.3* 33.8*  MCV 96.3 97.2 97.7  PLT 188 183 226   Basic Metabolic Panel: Recent Labs  Lab 02/06/18 2028 02/10/18 0359  NA 138 138  K 4.3 4.1  CL 105 102  CO2 21* 26  GLUCOSE 105* 112*  BUN 15 7*  CREATININE 1.17 0.89  CALCIUM 8.5* 8.1*   GFR: Estimated Creatinine Clearance: 84.7 mL/min (by C-G formula based on SCr of 0.89 mg/dL). Recent Labs  Lab 02/06/18 2028 02/07/18 0034 02/07/18 0657 02/08/18 0340 02/10/18 0359  PROCALCITON  --  0.16  --   --   --   WBC 13.3*  --   --  11.8* 9.2  LATICACIDVEN  --  1.4 1.9  --   --    Liver Function Tests: Recent Labs  Lab 02/10/18 0359  AST 36  ALT 29  ALKPHOS 110  BILITOT 0.8  PROT 5.4*  ALBUMIN 2.0*   No results for input(s): LIPASE, AMYLASE in the last 168 hours. No results for input(s): AMMONIA in the last 168 hours. ABG    Component Value Date/Time   PHART 7.470 (H)  02/09/2018 1344   PCO2ART 33.7 02/09/2018 1344   PO2ART 67.8 (L) 02/09/2018 1344   HCO3 24.2 02/09/2018 1344   O2SAT 94.3 02/09/2018 1344    Coagulation Profile: No results for input(s): INR, PROTIME in the last 168 hours. Cardiac Enzymes: No results for input(s): CKTOTAL, CKMB, CKMBINDEX, TROPONINI in the last 168 hours. HbA1C: No results found for: HGBA1C CBG: No results for input(s): GLUCAP in the last 168 hours.  CXR: reviewed - BL infiltrates present   CT Chest: BL GGO with intraparenchymal cyst formation   This patient is critically ill with multiple organ system failure, impending respiratory failure; which, requires frequent high complexity decision making, assessment, support, evaluation, and titration of therapies. This was completed through the application of advanced monitoring technologies and extensive interpretation of multiple databases. During this encounter critical care time was devoted to patient care services described in this note  for 36 minutes.   Josephine Igo, DO [facility] 02/10/2018 10:12 AM  Personal pager: [phone] If unanswered, please page CCM On-call: #[phone]

## 2018-02-10 NOTE — Progress Notes (Signed)
NIF: greater than -40 VC: 3.0L  With good patient effort.

## 2018-02-11 ENCOUNTER — Encounter (HOSPITAL_COMMUNITY)
Admission: EM | Disposition: A | Discharge status: Home Health | Payer: Self-pay | Source: Home / Self Care | Attending: Pulmonary Disease

## 2018-02-11 ENCOUNTER — Encounter (HOSPITAL_COMMUNITY): Payer: Medicare HMO

## 2018-02-11 DIAGNOSIS — J9690 Respiratory failure, unspecified, unspecified whether with hypoxia or hypercapnia: Secondary | ICD-10-CM

## 2018-02-11 DIAGNOSIS — J449 Chronic obstructive pulmonary disease, unspecified: Secondary | ICD-10-CM

## 2018-02-11 DIAGNOSIS — J849 Interstitial pulmonary disease, unspecified: Secondary | ICD-10-CM

## 2018-02-11 LAB — CBC
HEMATOCRIT: 30.8 % — AB (ref 39.0–52.0)
Hemoglobin: 10 g/dL — ABNORMAL LOW (ref 13.0–17.0)
MCH: 32.1 pg (ref 26.0–34.0)
MCHC: 32.5 g/dL (ref 30.0–36.0)
MCV: 98.7 fL (ref 78.0–100.0)
Platelets: 296 10*3/uL (ref 150–400)
RBC: 3.12 MIL/uL — ABNORMAL LOW (ref 4.22–5.81)
RDW: 13.1 % (ref 11.5–15.5)
WBC: 7.6 10*3/uL (ref 4.0–10.5)

## 2018-02-11 LAB — HEPARIN LEVEL (UNFRACTIONATED): Heparin Unfractionated: 0.14 IU/mL — ABNORMAL LOW (ref 0.30–0.70)

## 2018-02-11 LAB — APTT: aPTT: 63 seconds — ABNORMAL HIGH (ref 24–36)

## 2018-02-11 SURGERY — VIDEO BRONCHOSCOPY WITHOUT FLUORO
Anesthesia: Moderate Sedation | Laterality: Bilateral

## 2018-02-11 MED ORDER — HEPARIN (PORCINE) IN NACL 100-0.45 UNIT/ML-% IJ SOLN
1350.0000 [IU]/h | INTRAMUSCULAR | Status: AC
  Administered 2018-02-11 (×2): 1200 [IU]/h via INTRAVENOUS
  Administered 2018-02-12: 1350 [IU]/h via INTRAVENOUS
  Filled 2018-02-11 (×2): qty 250

## 2018-02-11 NOTE — Progress Notes (Signed)
Curtis Preston  ZOX:[id number] DOB: 08/31/1950 DOA: 02/06/2018 PCP: No primary care provider on file.    LOS: 3 days   Reason for Consult / Chief Complaint:  Pneumonia in immunocompromised patient.   Consulting MD and date of consult:  Dr. Waymon Amato  HPI/summary of hospital stay:  67 year old male with PMH as below, which is significant for Myasthenia gravis on imuran, COPD, PE on Xarelto, and CAD. He was in his usual state of health (gets winded daily) until about early August of this year, when he developed worsening shortness of breath with associated productive cough (green sputum). This persisted for about 6 weeks, until about 9/6, when his symptoms significant worsened after traveling here to [place] from [place] (where he lives). SOB worsened as did his productive cough, which caused him to present to ED. Workup in ED suggested pneumonia and he was started on empiric antibiotics. CT demonstrated bilateral ground glass, and most concerning for multi-focal pneumonia. Infectious disease consultation sees benefit in bronchoscopy for culture. PCCM consulted and recommended transfer to the intensive care unit for monitoring of respiratory status.  Subjective  Acute hypoxemic respiratory failure requiring HFNC  Assessment & Plan:   Acute hypoxemic respiratory failure secondary to community acquired pneumonia in an immunosuppressed patient, possible atypical organism Acute hypoxemic respiratory failure - CT imaging with BL GGO and multiple intraparenchymal cyst/pneumatocele formation, LDH 311, would consider the diagnosis of PJP pneumonia  - LIP would be in the differential as well, BL GGO with cyst formation in a patient with Sjogrens syndrome  - Continue antimicrobial regimen per infectious disease - Will send sputum for PCP DFA in the meantime  - Patient was on Xarelto which has been held (last dose 9/9).   - Heparin drip on hold for potential bronch today - Bronch today if ID  feels it necessary, my concern is that patient will need to be intubated - If no bronch will restart heparin - NPO for now, if will bronch then ill need ETT - Titrate O2 for sat of 88-92%  Myasthenia Gravis, with possible exacerbation  - Management at this point per neurology - Agree that with history of PE, IVIG would increase his risk of recurrent VTE, will hold ofF IVIG at this point - Plasma exchange may be the best option but patient is stable from a MG stantpoint - He will need an HD cath and CCM can place this if needed  - If NIF falls below -20 or VC <30cc/kg for him would be less than 2.2L then we will consider intubation and mechanical support. - The ciprofloxacin will likely continue to exacerbate his MG, will defer to ID - Would avoid if possible    Hemoptysis - Stable, improved   COPD - Continue scheduled duonebs   Consultants: date of consult/date signed off (if applicable)/final recs  ID  Procedures:  Significant Diagnostic Tests: CT chest 9/9 > Extensive confluent ground-glass attenuation throughout both lungs with consolidation more prevalent in the superior segment of the left lower lobe. The findings are nonspecific, but most concerning for multi lobar pneumonia. Edema or possibly could have a similar appearance. This is to extensive for atelectasis. Consolidation in the superior segment in the left lower lobe suggests infection.  Micro Data: Blood 9/6 > Sputum 9/6 > RVP 9/7: Negative  Antimicrobials:  Azithromycin 9/6 > 9/9 Cefepime 9/6 > 9/9  Vancomycin 9/6>>>9/9 Ciprofloxacin 9/9>>>  Objective    Examination: General appearance: 67 y.o., male, NAD, conversant,  apperars comfortable in bed  Eyes: anicteric sclerae, moist conjunctivae; tracking appropriately  HENT: NCAT; oropharynx, MMM, no mucosal ulcerations; Neck: Trachea midline; no JVD  Lungs: BL rhonchi, no wheeze  CV: RRR, S1, S2, distant heart tones, no overt murmur  Abdomen: Soft,  non-tender; non-distended, BS present  Extremities: No peripheral edema or radial and DP pulses present bilaterally  Skin: Normal temperature, turgor and texture; no rash Psych: Appropriate affect, normal mood  Neuro: Alert and oriented to person and place, no focal deficit    Blood pressure 117/69, pulse 76, temperature 98.3 F (36.8 C), temperature source Oral, resp. rate (!) 32, height 6\' 1"  (1.854 m), weight 76.3 kg, SpO2 99 %.        Intake/Output Summary (Last 24 hours) at 02/11/2018 0931 Last data filed at 02/11/2018 0742 Gross per 24 hour  Intake 3249.74 ml  Output 2925 ml  Net 324.74 ml   Filed Weights   02/09/18 0606 02/10/18 0500 02/11/18 0530  Weight: 76.3 kg 76.4 kg 76.3 kg   Labs    CBC: Recent Labs  Lab 02/06/18 2028 02/08/18 0340 02/10/18 0359  WBC 13.3* 11.8* 9.2  NEUTROABS 11.6*  --  7.3  HGB 13.8 11.5* 11.4*  HCT 41.4 34.3* 33.8*  MCV 96.3 97.2 97.7  PLT 188 183 226   Basic Metabolic Panel: Recent Labs  Lab 02/06/18 2028 02/10/18 0359  NA 138 138  K 4.3 4.1  CL 105 102  CO2 21* 26  GLUCOSE 105* 112*  BUN 15 7*  CREATININE 1.17 0.89  CALCIUM 8.5* 8.1*   GFR: Estimated Creatinine Clearance: 84.5 mL/min (by C-G formula based on SCr of 0.89 mg/dL). Recent Labs  Lab 02/06/18 2028 02/07/18 0034 02/07/18 0657 02/08/18 0340 02/10/18 0359  PROCALCITON  --  0.16  --   --   --   WBC 13.3*  --   --  11.8* 9.2  LATICACIDVEN  --  1.4 1.9  --   --    Liver Function Tests: Recent Labs  Lab 02/10/18 0359  AST 36  ALT 29  ALKPHOS 110  BILITOT 0.8  PROT 5.4*  ALBUMIN 2.0*   No results for input(s): LIPASE, AMYLASE in the last 168 hours. No results for input(s): AMMONIA in the last 168 hours. ABG    Component Value Date/Time   PHART 7.470 (H) 02/09/2018 1344   PCO2ART 33.7 02/09/2018 1344   PO2ART 67.8 (L) 02/09/2018 1344   HCO3 24.2 02/09/2018 1344   O2SAT 94.3 02/09/2018 1344    Coagulation Profile: No results for input(s): INR,  PROTIME in the last 168 hours. Cardiac Enzymes: No results for input(s): CKTOTAL, CKMB, CKMBINDEX, TROPONINI in the last 168 hours. HbA1C: No results found for: HGBA1C CBG: No results for input(s): GLUCAP in the last 168 hours.  CXR: reviewed - BL infiltrates present   CT Chest: BL GGO with intraparenchymal cyst formation   The patient is critically ill with multiple organ systems failure and requires high complexity decision making for assessment and support, frequent evaluation and titration of therapies, application of advanced monitoring technologies and extensive interpretation of multiple databases.   Critical Care Time devoted to patient care services described in this note is  34  Minutes. This time reflects time of care of this signee Dr Koren Bound. This critical care time does not reflect procedure time, or teaching time or supervisory time of PA/NP/Med student/Med Resident etc but could involve care discussion time.  Alyson Reedy, M.D. [facility]/Critical  Care Medicine. Pager: [phone]. After hours pager: [phone].

## 2018-02-11 NOTE — Progress Notes (Signed)
NIF 2.4-2.7 L done x 2, NIF greater than -40 x2. CCM MD aware.

## 2018-02-11 NOTE — Progress Notes (Signed)
ANTICOAGULATION CONSULT NOTE - Initial Consult  Pharmacy Consult for Heparin Indication: history of pulmonary embolus  Allergies  Allergen Reactions  . Hydromorphone Other (See Comments)    hallucinations   . Immune Globulin Other (See Comments)     Bilateral pulmonary embolism      Patient Measurements: Height: 6\' 1"  (185.4 cm) Weight: 168 lb 3.4 oz (76.3 kg) IBW/kg (Calculated) : 79.9 HEPARIN DW (KG): 76.3   Vital Signs: Temp: 98.3 F (36.8 C) (09/11 0742) Temp Source: Oral (09/11 0742) BP: 136/74 (09/11 1000) Pulse Rate: 89 (09/11 1000)  Labs: Recent Labs    02/10/18 0359 02/10/18 1404  HGB 11.4*  --   HCT 33.8*  --   PLT 226  --   APTT  --  41*  HEPARINUNFRC  --  0.20*  CREATININE 0.89  --     Estimated Creatinine Clearance: 84.5 mL/min (by C-G formula based on SCr of 0.89 mg/dL).   Medical History: Past Medical History:  Diagnosis Date  . Bronchiectasis (HCC)   . CAD (coronary artery disease)   . COPD (chronic obstructive pulmonary disease) (HCC)   . Depression with anxiety   . GERD (gastroesophageal reflux disease)   . Hypothyroidism   . Myasthenia gravis (HCC)   . PE (pulmonary thromboembolism) (HCC)   . Sjogren's disease (HCC)   . Tobacco abuse     Medications:  Scheduled:  . aspirin EC  81 mg Oral Daily  . atorvastatin  40 mg Oral QHS  . azaTHIOprine  200 mg Oral QHS  . azelastine  2 spray Each Nare BID  . baclofen  10 mg Oral TID  . benzonatate  200 mg Oral TID  . buPROPion  150 mg Oral BID  . calcium-vitamin D  1 tablet Oral Daily  . chlorhexidine  15 mL Mouth Rinse BID  . fluticasone  1 spray Each Nare BID  . Influenza vac split quadrivalent PF  0.5 mL Intramuscular Tomorrow-1000  . ipratropium-albuterol  3 mL Inhalation Q6H  . levothyroxine  175 mcg Oral QAC breakfast  . mouth rinse  15 mL Mouth Rinse q12n4p  . mirtazapine  30 mg Oral QHS  . montelukast  10 mg Oral QHS  . morphine  15 mg Oral QHS  . multivitamin  1 tablet  Oral BID  . nicotine  21 mg Transdermal Daily  . pantoprazole  40 mg Oral Daily  . pregabalin  50 mg Oral QHS  . pyridostigmine  30 mg Oral QID  . sodium chloride flush  10-40 mL Intracatheter Q12H  . tiotropium  18 mcg Inhalation Daily  . venlafaxine  225 mg Oral Daily  . vitamin B-12  1,000 mcg Oral Daily   Infusions:  . sodium chloride 100 mL/hr at 02/11/18 1000  . ciprofloxacin Stopped (02/11/18 0502)    Assessment: Pt is a 67 yoM with a history of PE 10/2016. Pt was on rivaroxaban PTA, last dose 9/9. Patient started on heparin gtt in the interim. Hemoglobin stable.   Patient was started on heparin 1200 units/hr last evening, was discontinued in anticipation of bronch this morning. No longer planning for bronch.    Goal of Therapy: APTT level 66-102s  Heparin level 0.3-0.7 units/ml Monitor platelets by anticoagulation protocol: Yes   Plan:  - Re-start heparin infusion at 1200 units/hr - Check anti-Xa level and aPTT, given recent rivaroxaban use, in 6 hours and daily while on heparin - Check CBC in 6 hours  - Continue to monitor H&H  and platelets   Marcelino Freestone, PharmD PGY2 Cardiology Pharmacy Resident Phone [phone] 02/11/2018 10:52 AM

## 2018-02-11 NOTE — Progress Notes (Signed)
ANTICOAGULATION CONSULT NOTE   Pharmacy Consult for Heparin Indication: history of pulmonary embolus  Allergies  Allergen Reactions  . Hydromorphone Other (See Comments)    hallucinations   . Immune Globulin Other (See Comments)     Bilateral pulmonary embolism      Patient Measurements: Height: 6\' 1"  (185.4 cm) Weight: 168 lb 3.4 oz (76.3 kg) IBW/kg (Calculated) : 79.9 HEPARIN DW (KG): 76.3   Vital Signs: Temp: 98.4 F (36.9 C) (09/11 1620) Temp Source: Oral (09/11 1620) BP: 134/82 (09/11 1800) Pulse Rate: 89 (09/11 1800)  Labs: Recent Labs    02/10/18 0359 02/10/18 1404 02/11/18 1804  HGB 11.4*  --  10.0*  HCT 33.8*  --  30.8*  PLT 226  --  296  APTT  --  41* 63*  HEPARINUNFRC  --  0.20*  --   CREATININE 0.89  --   --     Estimated Creatinine Clearance: 84.5 mL/min (by C-G formula based on SCr of 0.89 mg/dL).   Medical History: Past Medical History:  Diagnosis Date  . Bronchiectasis (HCC)   . CAD (coronary artery disease)   . COPD (chronic obstructive pulmonary disease) (HCC)   . Depression with anxiety   . GERD (gastroesophageal reflux disease)   . Hypothyroidism   . Myasthenia gravis (HCC)   . PE (pulmonary thromboembolism) (HCC)   . Sjogren's disease (HCC)   . Tobacco abuse     Medications:  Scheduled:  . aspirin EC  81 mg Oral Daily  . atorvastatin  40 mg Oral QHS  . azelastine  2 spray Each Nare BID  . baclofen  10 mg Oral TID  . benzonatate  200 mg Oral TID  . buPROPion  150 mg Oral BID  . calcium-vitamin D  1 tablet Oral Daily  . chlorhexidine  15 mL Mouth Rinse BID  . fluticasone  1 spray Each Nare BID  . Influenza vac split quadrivalent PF  0.5 mL Intramuscular Tomorrow-1000  . ipratropium-albuterol  3 mL Inhalation Q6H  . levothyroxine  175 mcg Oral QAC breakfast  . mouth rinse  15 mL Mouth Rinse q12n4p  . mirtazapine  30 mg Oral QHS  . montelukast  10 mg Oral QHS  . morphine  15 mg Oral QHS  . multivitamin  1 tablet Oral BID   . nicotine  21 mg Transdermal Daily  . pantoprazole  40 mg Oral Daily  . pregabalin  50 mg Oral QHS  . pyridostigmine  30 mg Oral QID  . sodium chloride flush  10-40 mL Intracatheter Q12H  . tiotropium  18 mcg Inhalation Daily  . venlafaxine  225 mg Oral Daily  . vitamin B-12  1,000 mcg Oral Daily   Infusions:  . sodium chloride Stopped (02/11/18 1756)  . ciprofloxacin Stopped (02/11/18 1717)  . heparin Stopped (02/11/18 1756)    Assessment: Pt is a 74 yoM with a history of PE 10/2016. Pt was on rivaroxaban PTA, last dose 9/9. Patient started on heparin gtt in the interim. Hemoglobin stable.   Patient was started on heparin 1200 units/hr last evening, was discontinued in anticipation of bronch this morning. No longer planning for bronch.    Heparin level subtherapeutic at 0.14, aPTT slightly subtherapeutic at 63 - correlating now. Hgb 10, plt 296. No s/sx of bleeding. No infusion issues.   Goal of Therapy: APTT level 66-102s  Heparin level 0.3-0.7 units/ml Monitor platelets by anticoagulation protocol: Yes   Plan:  - Increase heparin infusion to  1300 units/hr - Check anti-Xa level in 6 hours - Check CBC in 6 hours  - Continue to monitor H&H and platelets  Girard Cooter, PharmD Clinical Pharmacist  Pager: [phone] Phone: [phone] 02/11/2018 7:03 PM

## 2018-02-11 NOTE — Progress Notes (Addendum)
Neurology Progress Note   S:// Patient is seen and examined in the ICU. Patient appears in much better spirits and health than yesterday. Patient awake and alert in bed NAD on 10 L [place]. Cooperative with exam and understands POC. Patient will not per CC be receiving a bronch at this time.    O:// Current vital signs: BP 117/69   Pulse 76   Temp 98.3 F (36.8 C) (Oral)   Resp (!) 32   Ht 6\' 1"  (1.854 m)   Wt 76.3 kg   SpO2 99%   BMI 22.19 kg/m  Vital signs in last 24 hours: Temp:  [98 F (36.7 C)-98.6 F (37 C)] 98.3 F (36.8 C) (09/11 0742) Pulse Rate:  [76-100] 76 (09/11 0700) Resp:  [18-43] 32 (09/11 0700) BP: (96-153)/(51-79) 117/69 (09/11 0700) SpO2:  [92 %-100 %] 99 % (09/11 0803) Weight:  [76.3 kg] 76.3 kg (09/11 0530)   GENERAL: Awake, alert in NAD HEENT: - Normocephalic and atraumatic, dry mm,  LUNGS -scattered rales, on 10 L [place] CV - S1/S2 RRR, no m/r/g, equal pulses bilaterally. ABDOMEN - Soft, nontender, nondistended with normoactive BS Ext: warm, well perfused, intact peripheral pulses, no edema  NEURO:  Mental Status: AA&Ox3  Language: speech is not dysarthric.  Naming, repetition, fluency, and comprehension intact. Cranial Nerves: PERRL, left ptosis more prominent than right ptosis.  Patient noted diplopia with right lateral gaze, none on left gaze but mildly impaired on upward gaze with mild disconjugate gaze in all directions except left gaze. an visual fields full, no facial asymmetry, facial sensation intact, hearing intact, tongue/uvula/soft palate midline, normal sternocleidomastoid and trapezius muscle strength. No evidence of tongue atrophy or fibrillations Motor: Bilateral upper extremities are 4/5 with easy fatigability.  Bilateral lower extremities are 4/5 also with easy fatigability. Tone: is normal and bulk is normal Sensation- Intact to light touch bilaterally Coordination: FTN intact bilaterally, no ataxia in BLE. Gait-  deferred  Medications  Current Facility-Administered Medications:  .  0.9 %  sodium chloride infusion, , Intravenous, Continuous, Lorretta Harp, MD, Last Rate: 100 mL/hr at 02/11/18 0850 .  acetaminophen (TYLENOL) tablet 650 mg, 650 mg, Oral, Q6H PRN, Lorretta Harp, MD .  albuterol (PROVENTIL) (2.5 MG/3ML) 0.083% nebulizer solution 2.5 mg, 2.5 mg, Nebulization, Q4H PRN, Lorretta Harp, MD, 2.5 mg at 02/09/18 1048 .  aspirin EC tablet 81 mg, 81 mg, Oral, Daily, Lorretta Harp, MD, 81 mg at 02/10/18 0948 .  atorvastatin (LIPITOR) tablet 40 mg, 40 mg, Oral, QHS, Lorretta Harp, MD, 40 mg at 02/10/18 2120 .  azathioprine (IMURAN) tablet 200 mg, 200 mg, Oral, QHS, Lorretta Harp, MD, 200 mg at 02/10/18 2121 .  azelastine (ASTELIN) 0.1 % nasal spray 2 spray, 2 spray, Each Nare, BID, Lorretta Harp, MD, 2 spray at 02/10/18 1031 .  baclofen (LIORESAL) tablet 10 mg, 10 mg, Oral, TID, Lorretta Harp, MD, 10 mg at 02/10/18 2122 .  benzonatate (TESSALON) capsule 200 mg, 200 mg, Oral, TID, Hongalgi, Anand D, MD, 200 mg at 02/10/18 2124 .  buPROPion [facility] SR) 12 hr tablet 150 mg, 150 mg, Oral, BID, Lorretta Harp, MD, 150 mg at 02/10/18 2122 .  calcium-vitamin D (OSCAL WITH D) 500-200 MG-UNIT per tablet 1 tablet, 1 tablet, Oral, Daily, Lorretta Harp, MD, 1 tablet at 02/10/18 0948 .  chlorhexidine (PERIDEX) 0.12 % solution 15 mL, 15 mL, Mouth Rinse, BID, Craige Cotta, Vineet, MD, 15 mL at 02/10/18 2123 .  ciprofloxacin (CIPRO) IVPB 400 mg, 400 mg, Intravenous, Q12H, Zenaida Niece  Dam, Lisette Grinder, MD, Stopped at 02/11/18 0502 .  fluticasone (FLONASE) 50 MCG/ACT nasal spray 1 spray, 1 spray, Each Nare, BID, Lorretta Harp, MD, 1 spray at 02/10/18 1031 .  guaiFENesin-dextromethorphan (ROBITUSSIN DM) 100-10 MG/5ML syrup 10 mL, 10 mL, Oral, Q4H PRN, Hongalgi, Anand D, MD .  hydrALAZINE (APRESOLINE) injection 5 mg, 5 mg, Intravenous, Q2H PRN, Lorretta Harp, MD .  HYDROcodone-acetaminophen (NORCO/VICODIN) 5-325 MG per tablet 1 tablet, 1 tablet, Oral, TID PRN, Lorretta Harp,  MD, 1 tablet at 02/09/18 0425 .  Influenza vac split quadrivalent PF (FLUZONE HIGH-DOSE) injection 0.5 mL, 0.5 mL, Intramuscular, Tomorrow-1000, Sheehan, Theresa C, MD .  ipratropium-albuterol (DUONEB) 0.5-2.5 (3) MG/3ML nebulizer solution 3 mL, 3 mL, Inhalation, Q6H, Lorretta Harp, MD, 3 mL at 02/11/18 0802 .  levothyroxine (SYNTHROID, LEVOTHROID) tablet 175 mcg, 175 mcg, Oral, QAC breakfast, Lorretta Harp, MD, 175 mcg at 02/10/18 0827 .  MEDLINE mouth rinse, 15 mL, Mouth Rinse, q12n4p, Sood, Vineet, MD, 15 mL at 02/10/18 1549 .  mirtazapine (REMERON) tablet 30 mg, 30 mg, Oral, QHS, Lorretta Harp, MD, 30 mg at 02/10/18 2124 .  montelukast (SINGULAIR) tablet 10 mg, 10 mg, Oral, QHS, Lorretta Harp, MD, 10 mg at 02/10/18 2121 .  morphine (MS CONTIN) 12 hr tablet 15 mg, 15 mg, Oral, QHS, Lorretta Harp, MD, 15 mg at 02/10/18 2120 .  multivitamin (PROSIGHT) tablet 1 tablet, 1 tablet, Oral, BID, Lorretta Harp, MD, 1 tablet at 02/10/18 2122 .  nicotine (NICODERM CQ - dosed in mg/24 hours) patch 21 mg, 21 mg, Transdermal, Daily, Lorretta Harp, MD, 21 mg at 02/10/18 1015 .  ondansetron (ZOFRAN) tablet 4 mg, 4 mg, Oral, Q6H PRN, Lorretta Harp, MD .  pantoprazole (PROTONIX) EC tablet 40 mg, 40 mg, Oral, Daily, Lorretta Harp, MD, 40 mg at 02/10/18 1015 .  pregabalin (LYRICA) capsule 50 mg, 50 mg, Oral, QHS, Lorretta Harp, MD, 50 mg at 02/10/18 2121 .  pyridostigmine (MESTINON) tablet 30 mg, 30 mg, Oral, QID, Caryl Pina, MD, 30 mg at 02/10/18 2124 .  sodium chloride flush (NS) 0.9 % injection 10-40 mL, 10-40 mL, Intracatheter, Q12H, Lahoma Crocker, MD, 10 mL at 02/08/18 2228 .  sodium chloride flush (NS) 0.9 % injection 10-40 mL, 10-40 mL, Intracatheter, PRN, Lahoma Crocker, MD .  tiotropium [facility]) inhalation capsule 18 mcg, 18 mcg, Inhalation, Daily, Lorretta Harp, MD, 18 mcg at 02/11/18 0806 .  venlafaxine (EFFEXOR) tablet 225 mg, 225 mg, Oral, Daily, Lorretta Harp, MD, 225 mg at 02/10/18 0949 .  vitamin B-12 (CYANOCOBALAMIN)  tablet 1,000 mcg, 1,000 mcg, Oral, Daily, Lorretta Harp, MD, 1,000 mcg at 02/10/18 0948 .  zolpidem (AMBIEN) tablet 5 mg, 5 mg, Oral, QHS PRN, Lorretta Harp, MD Labs CBC    Component Value Date/Time   WBC 9.2 02/10/2018 0359   RBC 3.46 (L) 02/10/2018 0359   HGB 11.4 (L) 02/10/2018 0359   HCT 33.8 (L) 02/10/2018 0359   PLT 226 02/10/2018 0359   MCV 97.7 02/10/2018 0359   MCH 32.9 02/10/2018 0359   MCHC 33.7 02/10/2018 0359   RDW 13.0 02/10/2018 0359   LYMPHSABS 1.0 02/10/2018 0359   MONOABS 0.6 02/10/2018 0359   EOSABS 0.3 02/10/2018 0359   BASOSABS 0.0 02/10/2018 0359    CMP     Component Value Date/Time   NA 138 02/10/2018 0359   K 4.1 02/10/2018 0359   CL 102 02/10/2018 0359   CO2 26 02/10/2018 0359   GLUCOSE 112 (H) 02/10/2018 0359   BUN  7 (L) 02/10/2018 0359   CREATININE 0.89 02/10/2018 0359   CALCIUM 8.1 (L) 02/10/2018 0359   PROT 5.4 (L) 02/10/2018 0359   ALBUMIN 2.0 (L) 02/10/2018 0359   AST 36 02/10/2018 0359   ALT 29 02/10/2018 0359   ALKPHOS 110 02/10/2018 0359   BILITOT 0.8 02/10/2018 0359   GFRNONAA >60 02/10/2018 0359   GFRAA >60 02/10/2018 0359   Imaging I have reviewed images in epic and the results pertinent to this consultation are: No CNS imaging to review  Valentina Lucks, MSN, NP-C [facility] [phone]   Attending Neurologist note to follow:  Assessment:  67 year old man with possible myasthenic crisis in the setting of complicated patchy bilateral pneumonia. He has had multiple hospitalizations for treatment of this pneumonia and has had somewhat of a resistant course of treatment of the pneumonia. Currently with increasing difficulty breathing and increasing work of breathing that required him to be moved from the hospital floor to the intensive care unit for respiratory support. His most recent NIF is greater than -40 and vital capacity is 2.4-2.7L. At this time, he is not at a point where he needs emergent intubation. The  fact that he has been on chronic immunosuppression with Imuran, and been on Mestinon which might have caused cholinergic effects including increasing secretions, might have contributed to the treatment resistant or recurrent pneumonias. At this time, I think it is prudent to take him off of Imuran for that short term. I have attempted to reach his primary neurologist in [facility], and awaiting a callback at this time.   Impression: Complicated pneumonia-likely secondary to immune suppression Possible myasthenic exacerbation as well as cholinergic crisis-more likely cholinergic crisis, improving with reduced mestinon dose  Recommendations: -Continue with his respiratory checks every 6 to daily 8 hours. - If his NIF is below -20, and FVC's fall below 30cc/kg, can consider intubation for respiratory support at that time. - At this point, it is going to be complicated to do plasma exchange as he is also on anticoagulation for his PE, which would also preclude him from IVIG as that can be thrombophilic and is not indicated in patients with PE/DVT. - If he continues to worsen in terms of his respiratory status, I will recommend doing plasma exchange at that time.  Until then, I would treat him with antibiotics, provide respiratory support as well as continue with his Mestinon 30 mg 4 times a day, which is the half of his prior dose, and respiratory checks. -Keep off of Imuran - I have left messages for his outpatient neurologist and have not received a call back yet.  I will update with any recommendations received from them if I receive a call back.  Attending Neurohospitalist Addendum Patient seen and examined with APP/Resident. Agree with the history and physical as documented above. Agree with the plan as documented, which I helped formulate. I have independently reviewed the chart, obtained history, review of systems and examined the patient.I have personally reviewed pertinent  head/neck/spine imaging (CT/MRI).  CRITICAL CARE ATTESTATION This patient is critically ill and at significant risk of neurological worsening, death and care requires constant monitoring of vital signs, hemodynamics, respiratory, and cardiac monitoring. I spent 30  minutes of neurocritical care time performing neurological assessment, discussion with family, other specialists and medical decision making of high complexity in the care of  this patient.  Please feel free to call with any questions. --- Milon Dikes, MD [facility] Pager: [phone]  If  7pm to 7am, please call on call as listed on AMION.

## 2018-02-11 NOTE — Progress Notes (Signed)
[name] for Infectious Disease  Date of Admission:  02/06/2018   Total days of antibiotics 6        Day 3 ciprofloxacin         ASSESSMENT: 67 year-old male, immunocompromised, with atypical pneumonia. Differential is still broad including tularemia, pcp, LIP. His urine histoplasma antigen was negative. Blood cultures are still negative but F. Tularensis is slow growing. PCP DFA is pending but is not as sensitive as testing with BAL. Pulmonologist is rightfully concerned that Curtis Preston will need intubation during bronchoscopy given his current oxygen requirement. We discussed whether to do the procedure and decided that we will hold off for now. While on cipro, yield for tularemia from BAL samples is lower. If this is indeed tularemia, we expect him to improve; his lungs sound better today. If he worsens, he will likely require intubation and can proceed with bronch then. We would still like the bronchoscopy as the possibility of autoimmune pneumonitis is high. We will continue to monitor him and will also send labs to [facility] for tularemia.   PLAN: 1. Continue ciprofloxacin 2. Hold off on bronchoscopy for now. Would still recommend getting in the future if he doesn't improve.  3. F/u BCx, PCP DFA 4. Send tularemia labs to [facility]  Principal Problem:   Sepsis ([place]) Active Problems:   CAD (coronary artery disease)   COPD (chronic obstructive pulmonary disease) (HCC)   Depression with anxiety   GERD (gastroesophageal reflux disease)   Hypothyroidism   Myasthenia gravis ([place])   PE (pulmonary thromboembolism) (HCC)   Tobacco abuse   Lobar pneumonia (HCC)   Scheduled Meds: . aspirin EC  81 mg Oral Daily  . atorvastatin  40 mg Oral QHS  . azaTHIOprine  200 mg Oral QHS  . azelastine  2 spray Each Nare BID  . baclofen  10 mg Oral TID  . benzonatate  200 mg Oral TID  . buPROPion  150 mg Oral BID  . calcium-vitamin D  1 tablet Oral Daily  . chlorhexidine  15 mL Mouth Rinse BID   . fluticasone  1 spray Each Nare BID  . Influenza vac split quadrivalent PF  0.5 mL Intramuscular Tomorrow-1000  . ipratropium-albuterol  3 mL Inhalation Q6H  . levothyroxine  175 mcg Oral QAC breakfast  . mouth rinse  15 mL Mouth Rinse q12n4p  . mirtazapine  30 mg Oral QHS  . montelukast  10 mg Oral QHS  . morphine  15 mg Oral QHS  . multivitamin  1 tablet Oral BID  . nicotine  21 mg Transdermal Daily  . pantoprazole  40 mg Oral Daily  . pregabalin  50 mg Oral QHS  . pyridostigmine  30 mg Oral QID  . sodium chloride flush  10-40 mL Intracatheter Q12H  . tiotropium  18 mcg Inhalation Daily  . venlafaxine  225 mg Oral Daily  . vitamin B-12  1,000 mcg Oral Daily   Continuous Infusions: . sodium chloride 100 mL/hr at 02/11/18 1000  . ciprofloxacin Stopped (02/11/18 0502)  . heparin     PRN Meds:.acetaminophen, albuterol, guaiFENesin-dextromethorphan, hydrALAZINE, HYDROcodone-acetaminophen, ondansetron, sodium chloride flush, zolpidem   SUBJECTIVE: Curtis Preston states that he is feeling better today because his breathing is better, he is having less chest tightness, and his cough is better. He feels like he has less "gunk" in his lungs. He states that his sputum is still blood-tinged.  Review of Systems: Review of Systems  Constitutional: Negative for chills, diaphoresis and fever.  Respiratory: Positive for cough, hemoptysis, sputum production and shortness of breath. Negative for wheezing.   Cardiovascular: Negative for chest pain.  Gastrointestinal: Negative for abdominal pain, nausea and vomiting.    Allergies  Allergen Reactions  . Hydromorphone Other (See Comments)    hallucinations   . Immune Globulin Other (See Comments)     Bilateral pulmonary embolism      OBJECTIVE: Vitals:   02/11/18 0800 02/11/18 0803 02/11/18 0900 02/11/18 1000  BP:   (!) 144/75 136/74  Pulse: 85  94 89  Resp: (!) 34  (!) 33 (!) 33  Temp:      TempSrc:      SpO2: 92% 99% 95% 100%    Weight:      Height:       Body mass index is 22.19 kg/m.  Physical Exam  Constitutional: He is oriented to person, place, and time. He appears ill. No distress.  Cardiovascular: Normal rate, regular rhythm and normal heart sounds.  Pulmonary/Chest: Effort normal. He has rales in the right middle field, the right lower field, the left middle field and the left lower field.  Rales less prominent today.  Abdominal: Soft. Bowel sounds are normal. He exhibits no distension. There is no tenderness.  Neurological: He is alert and oriented to person, place, and time.    Lab Results Lab Results  Component Value Date   WBC 9.2 02/10/2018   HGB 11.4 (L) 02/10/2018   HCT 33.8 (L) 02/10/2018   MCV 97.7 02/10/2018   PLT 226 02/10/2018    Lab Results  Component Value Date   CREATININE 0.89 02/10/2018   BUN 7 (L) 02/10/2018   NA 138 02/10/2018   K 4.1 02/10/2018   CL 102 02/10/2018   CO2 26 02/10/2018    Lab Results  Component Value Date   ALT 29 02/10/2018   AST 36 02/10/2018   ALKPHOS 110 02/10/2018   BILITOT 0.8 02/10/2018     Microbiology: Recent Results (from the past 240 hour(s))  Blood culture (routine x 2)     Status: None (Preliminary result)   Collection Time: 02/06/18 11:35 PM  Result Value Ref Range Status   Specimen Description BLOOD RIGHT ANTECUBITAL  Final   Special Requests   Final    BOTTLES DRAWN AEROBIC AND ANAEROBIC Blood Culture adequate volume   Culture   Final    NO GROWTH 4 DAYS Performed at [facility], [place] [address], [place], [place] [address]    Report Status PENDING  Incomplete  Blood culture (routine x 2)     Status: None (Preliminary result)   Collection Time: 02/06/18 11:45 PM  Result Value Ref Range Status   Specimen Description BLOOD LEFT HAND  Final   Special Requests   Final    BOTTLES DRAWN AEROBIC AND ANAEROBIC Blood Culture adequate volume   Culture   Final    NO GROWTH 4 DAYS Performed at [facility], [place]  [address] [place], [place] [address]    Report Status PENDING  Incomplete  Respiratory Panel by PCR     Status: None   Collection Time: 02/07/18  8:24 AM  Result Value Ref Range Status   Adenovirus NOT DETECTED NOT DETECTED Final   Coronavirus 229E NOT DETECTED NOT DETECTED Final   Coronavirus HKU1 NOT DETECTED NOT DETECTED Final   Coronavirus NL63 NOT DETECTED NOT DETECTED Final   Coronavirus OC43 NOT DETECTED NOT DETECTED  Final   Metapneumovirus NOT DETECTED NOT DETECTED Final   Rhinovirus / Enterovirus NOT DETECTED NOT DETECTED Final   Influenza A NOT DETECTED NOT DETECTED Final   Influenza B NOT DETECTED NOT DETECTED Final   Parainfluenza Virus 1 NOT DETECTED NOT DETECTED Final   Parainfluenza Virus 2 NOT DETECTED NOT DETECTED Final   Parainfluenza Virus 3 NOT DETECTED NOT DETECTED Final   Parainfluenza Virus 4 NOT DETECTED NOT DETECTED Final   Respiratory Syncytial Virus NOT DETECTED NOT DETECTED Final   Bordetella pertussis NOT DETECTED NOT DETECTED Final   Chlamydophila pneumoniae NOT DETECTED NOT DETECTED Final   Mycoplasma pneumoniae NOT DETECTED NOT DETECTED Final    Comment: Performed at [facility], [place] [address] [place], [place] [address]  MRSA PCR Screening     Status: None   Collection Time: 02/09/18  2:56 PM  Result Value Ref Range Status   MRSA by PCR NEGATIVE NEGATIVE Final    Comment:        The GeneXpert MRSA Assay (FDA approved for NASAL specimens only), is one component of a comprehensive MRSA colonization surveillance program. It is not intended to diagnose MRSA infection nor to guide or monitor treatment for MRSA infections. Performed at [facility], [place] [address], [place], [place] [address]     Krystianna Soth  Guzman Cisneros, [facility] for [facility] [place] [facility] [phone] pager   [phone] cell 02/11/2018, 11:05 AM

## 2018-02-12 ENCOUNTER — Inpatient Hospital Stay (HOSPITAL_COMMUNITY): Payer: Medicare HMO

## 2018-02-12 DIAGNOSIS — I951 Orthostatic hypotension: Secondary | ICD-10-CM

## 2018-02-12 LAB — BASIC METABOLIC PANEL
Anion gap: 8 (ref 5–15)
BUN: 8 mg/dL (ref 8–23)
CALCIUM: 7.5 mg/dL — AB (ref 8.9–10.3)
CO2: 25 mmol/L (ref 22–32)
CREATININE: 0.77 mg/dL (ref 0.61–1.24)
Chloride: 105 mmol/L (ref 98–111)
GFR calc Af Amer: >60 mL/min (ref >60)
GFR calc non Af Amer: >60 mL/min (ref >60)
GLUCOSE: 89 mg/dL (ref 70–99)
POTASSIUM: 3.4 mmol/L — AB (ref 3.5–5.1)
SODIUM: 138 mmol/L (ref 135–145)

## 2018-02-12 LAB — BLOOD GAS, ARTERIAL
ACID-BASE EXCESS: 2 mmol/L (ref 0.0–2.0)
BICARBONATE: 25.4 mmol/L (ref 20.0–28.0)
DRAWN BY: 511471
FIO2: 100
O2 Saturation: 91.9 %
PATIENT TEMPERATURE: 98.6
PH ART: 7.467 — AB (ref 7.350–7.450)
pCO2 arterial: 35.6 mmHg (ref 32.0–48.0)
pO2, Arterial: 60.5 mmHg — ABNORMAL LOW (ref 83.0–108.0)

## 2018-02-12 LAB — HEPARIN LEVEL (UNFRACTIONATED)
Heparin Unfractionated: 0.44 IU/mL (ref 0.30–0.70)
Heparin Unfractionated: 1.9 IU/mL — ABNORMAL HIGH (ref 0.30–0.70)

## 2018-02-12 LAB — CBC
HCT: 30.2 % — ABNORMAL LOW (ref 39.0–52.0)
Hemoglobin: 9.6 g/dL — ABNORMAL LOW (ref 13.0–17.0)
MCH: 31.9 pg (ref 26.0–34.0)
MCHC: 31.8 g/dL (ref 30.0–36.0)
MCV: 100.3 fL — AB (ref 78.0–100.0)
PLATELETS: 343 10*3/uL (ref 150–400)
RBC: 3.01 MIL/uL — ABNORMAL LOW (ref 4.22–5.81)
RDW: 13.1 % (ref 11.5–15.5)
WBC: 6.7 10*3/uL (ref 4.0–10.5)

## 2018-02-12 LAB — PNEUMOCYSTIS JIROVECI SMEAR BY DFA: PNEUMOCYSTIS JIROVECI AG: NEGATIVE

## 2018-02-12 LAB — MAGNESIUM: MAGNESIUM: 2 mg/dL (ref 1.7–2.4)

## 2018-02-12 LAB — PHOSPHORUS: Phosphorus: 3.1 mg/dL (ref 2.5–4.6)

## 2018-02-12 MED ORDER — LEVOTHYROXINE SODIUM 75 MCG PO TABS
175.0000 ug | ORAL_TABLET | Freq: Every day | ORAL | Status: DC
  Administered 2018-02-13: 175 ug via ORAL
  Filled 2018-02-12: qty 1

## 2018-02-12 MED ORDER — PYRIDOSTIGMINE BROMIDE 60 MG PO TABS
30.0000 mg | ORAL_TABLET | Freq: Four times a day (QID) | ORAL | Status: DC
  Administered 2018-02-12 – 2018-02-13 (×3): 30 mg via ORAL
  Filled 2018-02-12 (×8): qty 0.5

## 2018-02-12 MED ORDER — POTASSIUM CHLORIDE CRYS ER 20 MEQ PO TBCR
40.0000 meq | EXTENDED_RELEASE_TABLET | Freq: Three times a day (TID) | ORAL | Status: AC
  Administered 2018-02-12 (×2): 40 meq via ORAL
  Filled 2018-02-12 (×2): qty 2

## 2018-02-12 MED ORDER — RIVAROXABAN 20 MG PO TABS
20.0000 mg | ORAL_TABLET | Freq: Every day | ORAL | Status: DC
  Administered 2018-02-12: 20 mg via ORAL
  Filled 2018-02-12: qty 1

## 2018-02-12 MED ORDER — FUROSEMIDE 10 MG/ML IJ SOLN
20.0000 mg | Freq: Once | INTRAMUSCULAR | Status: AC
  Administered 2018-02-12: 20 mg via INTRAVENOUS
  Filled 2018-02-12: qty 2

## 2018-02-12 NOTE — Progress Notes (Signed)
ANTICOAGULATION CONSULT NOTE   Pharmacy Consult for Heparin > Xarelto Indication: history of pulmonary embolus  Allergies  Allergen Reactions  . Hydromorphone Other (See Comments)    hallucinations   . Immune Globulin Other (See Comments)     Bilateral pulmonary embolism      Patient Measurements: Height: 6\' 1"  (185.4 cm) Weight: 168 lb 3.4 oz (76.3 kg) IBW/kg (Calculated) : 79.9 HEPARIN DW (KG): 76.3   Vital Signs: Temp: 98.1 F (36.7 C) (09/12 1653) Temp Source: Oral (09/12 1653) BP: 135/76 (09/12 1653) Pulse Rate: 91 (09/12 1653)  Labs: Recent Labs    02/10/18 0359  02/10/18 1404 02/11/18 1804 02/12/18 0315 02/12/18 0500 02/12/18 0948 02/12/18 1525  HGB 11.4*  --   --  10.0*  --  9.6*  --   --   HCT 33.8*  --   --  30.8*  --  30.2*  --   --   PLT 226  --   --  296  --  343  --   --   APTT  --   --  41* 63*  --   --   --   --   HEPARINUNFRC  --    < > 0.20* 0.14* 0.44  --  1.90* <0.10*  CREATININE 0.89  --   --   --   --  0.77  --   --    < > = values in this interval not displayed.    Estimated Creatinine Clearance: 94.1 mL/min (by C-G formula based on SCr of 0.77 mg/dL).   Medical History: Past Medical History:  Diagnosis Date  . Bronchiectasis (HCC)   . CAD (coronary artery disease)   . COPD (chronic obstructive pulmonary disease) (HCC)   . Depression with anxiety   . GERD (gastroesophageal reflux disease)   . Hypothyroidism   . Myasthenia gravis (HCC)   . PE (pulmonary thromboembolism) (HCC)   . Sjogren's disease (HCC)   . Tobacco abuse     Medications:  Scheduled:  . aspirin EC  81 mg Oral Daily  . atorvastatin  40 mg Oral QHS  . azelastine  2 spray Each Nare BID  . baclofen  10 mg Oral TID  . benzonatate  200 mg Oral TID  . buPROPion  150 mg Oral BID  . calcium-vitamin D  1 tablet Oral Daily  . chlorhexidine  15 mL Mouth Rinse BID  . fluticasone  1 spray Each Nare BID  . Influenza vac split quadrivalent PF  0.5 mL Intramuscular  Tomorrow-1000  . ipratropium-albuterol  3 mL Inhalation Q6H  . [START ON 02/13/2018] levothyroxine  175 mcg Oral QAC breakfast  . mouth rinse  15 mL Mouth Rinse q12n4p  . mirtazapine  30 mg Oral QHS  . montelukast  10 mg Oral QHS  . morphine  15 mg Oral QHS  . multivitamin  1 tablet Oral BID  . nicotine  21 mg Transdermal Daily  . pantoprazole  40 mg Oral Daily  . pregabalin  50 mg Oral QHS  . pyridostigmine  30 mg Oral QID  . rivaroxaban  20 mg Oral Q supper  . sodium chloride flush  10-40 mL Intracatheter Q12H  . tiotropium  18 mcg Inhalation Daily  . venlafaxine  225 mg Oral Daily  . vitamin B-12  1,000 mcg Oral Daily   Infusions:  . sodium chloride 100 mL/hr at 02/12/18 1750  . ciprofloxacin 200 mL/hr at 02/12/18 1750    Assessment: Pt  is a [age] yoM with a history of PE 10/2016. Pt was on rivaroxaban PTA, last dose 9/9. Patient started on heparin gtt in the interim.   Patient was started on heparin 1200 units/hr on 9/10, was discontinued in anticipation of bronch on 9/11. No longer planning for bronch, so heparin was restarted. No longer following aPTT given adequate renal function and time for rivaroxaban washout.   Heparin level this afternoon was drawn through the PICC line initially and falsely high at 1.91, then re-drawn and low at <0.1. Heparin has since been discontinued and the patient has transitioned to Xarelto.   Goal of Therapy: Heparin level 0.3-0.7 units/ml Monitor platelets by anticoagulation protocol: Yes   Plan:  - Heparin drip discontinued - Continue Xarelto 20 mg qd with supper  Thank you for allowing pharmacy to be a part of this patient's care.  Georgina PillionElizabeth Ganesh Deeg, PharmD, BCPS Clinical Pharmacist Please check [facility]ION for all [facility] numbers 02/12/2018 6:29 PM

## 2018-02-12 NOTE — Evaluation (Signed)
Physical Therapy Evaluation Patient Details Name: Curtis Preston MRN: [id number] DOB: 01/14/1951 Today's Date: 02/12/2018   History of Present Illness  67 yo admitted with SOB, multifocal PNA and sepsis. PMhx: myasthenia gravis, CoPD, PE, CAD  Clinical Impression  Pt pleasant on arrival, discussing time in [place] and how he loves to dance. Pt on 8L at arrival with SpO2 97-100%. Sitting EOB pt desat SpO2 84%, pt increased to 10L hfnc with cues for pursed lip breathing, SpO2 recovered to 94%. Able to return to 8L at completion of session. Pt with report of dizziness on sitting, orthostatics taken see below. Pt mod I for bed mobility with min assist for transfers. Pt with decreased strength, coordination, and balance as well as decreased endurance (see below for full PT problem list). Pt will benefit acutely from skilled therapy to address previously mentioned deficits to increase functional mobility and activity tolerance as well as to decrease fall risk.   Orthostatic VS for the past 24 hrs (Last 3 readings):  BP- Lying Pulse- Lying BP- Sitting Pulse- Sitting BP- Standing at 0 minutes Pulse- Standing at 0 minutes  02/12/18 1021 136/85 95 134/82 101 97/76 93         Follow Up Recommendations Supervision for mobility/OOB;Outpatient PT    Equipment Recommendations  3in1 (PT)    Recommendations for Other Services       Precautions / Restrictions Precautions Precautions: Fall      Mobility  Bed Mobility Overal bed mobility: Modified Independent             General bed mobility comments: increased time, use of rail on L   Transfers Overall transfer level: Needs assistance Equipment used: Rolling walker (2 wheeled) Transfers: Sit to/from UGI CorporationStand;Stand Pivot Transfers Sit to Stand: Min assist Stand pivot transfers: Min assist       General transfer comment: pt unsteady with report of dizziness, two sit to stand trials knee buckle on stand, second trial bil knee block.  stand pivot transfer min a to steady and lines   Ambulation/Gait             General Gait Details: pt deferred   Stairs            Wheelchair Mobility    Modified Rankin (Stroke Patients Only)       Balance Overall balance assessment: Needs assistance Sitting-balance support: Feet supported;Single extremity supported Sitting balance-Leahy Scale: Fair Sitting balance - Comments: able to sit without bil UE support  Postural control: Posterior lean   Standing balance-Leahy Scale: Poor Standing balance comment: pt with posterior lean at stand, dizziness with some sway.                              Pertinent Vitals/Pain Pain Assessment: 0-10 Pain Score: 2  Pain Location: neck and right shoulder Pain Descriptors / Indicators: Aching Pain Intervention(s): Limited activity within patient's tolerance;Repositioned;Monitored during session;Premedicated before session    Home Living Family/patient expects to be discharged to:: Private residence Living Arrangements: Spouse/significant other;Non-relatives/Friends Available Help at Discharge: Family Type of Home: House Home Access: Stairs to enter Entrance Stairs-Rails: None Entrance Stairs-Number of Steps: 2 Home Layout: Two level;Able to live on main level with bedroom/bathroom;Laundry or work area in [employer]ney Bowesbasement Home Equipment: Environmental consultantWalker - 2 wheels;Cane - single point Additional Comments: fiancee, brother, son, grandkids all live with pt    Prior Function Level of Independence: Independent  Comments: no O2 prior, retired, works in the yard, Estate manager/land agent        Extremity/Trunk Assessment   Upper Extremity Assessment Upper Extremity Assessment: Generalized weakness;Overall [facility] for tasks assessed    Lower Extremity Assessment Lower Extremity Assessment: Generalized weakness    Cervical / Trunk Assessment Cervical / Trunk Assessment: Kyphotic  Communication   Communication:  No difficulties  Cognition Arousal/Alertness: Awake/alert Behavior During Therapy: WFL for tasks assessed/performed Overall Cognitive Status: Within Functional Limits for tasks assessed                                        General Comments      Exercises     Assessment/Plan    PT Assessment Patient needs continued PT services  PT Problem List Decreased strength;Decreased mobility;Decreased safety awareness;Decreased coordination;Decreased activity tolerance;Cardiopulmonary status limiting activity;Decreased balance;Decreased knowledge of precautions       PT Treatment Interventions DME instruction;Functional mobility training;Balance training;Patient/family education;Gait training;Therapeutic activities;Stair training;Therapeutic exercise    PT Goals (Current goals can be found in the Care Plan section)  Acute Rehab PT Goals Patient Stated Goal: go home  PT Goal Formulation: With patient Time For Goal Achievement: 02/26/18 Potential to Achieve Goals: Fair    Frequency Min 3X/week   Barriers to discharge        Co-evaluation               AM-PAC PT "6 Clicks" Daily Activity  Outcome Measure Difficulty turning over in bed (including adjusting bedclothes, sheets and blankets)?: A Little Difficulty moving from lying on back to sitting on the side of the bed? : A Lot Difficulty sitting down on and standing up from a chair with arms (e.g., wheelchair, bedside commode, etc,.)?: Unable Help needed moving to and from a bed to chair (including a wheelchair)?: A Little Help needed walking in hospital room?: A Little Help needed climbing 3-5 steps with a railing? : A Lot 6 Click Score: 14    End of Session Equipment Utilized During Treatment: Gait belt;Oxygen Activity Tolerance: Patient tolerated treatment well;Patient limited by fatigue;Treatment limited secondary to medical complications (Comment)(orthostatics, dizzy ) Patient left: in chair;with call  bell/phone within reach;with chair alarm set Nurse Communication: Mobility status;Other (comment)(O2 status ) PT Visit Diagnosis: Unsteadiness on feet (R26.81);History of falling (Z91.81);Muscle weakness (generalized) (M62.81);Other abnormalities of gait and mobility (R26.89)    Time: 4098-1191 PT Time Calculation (min) (ACUTE ONLY): 28 min   Charges:   PT Evaluation $PT Eval Moderate Complexity: 1 Mod PT Treatments $Therapeutic Activity: 8-22 mins        Carma Lair, [place]  Acute Rehab [phone]   Carma Lair 02/12/2018, 1:19 PM

## 2018-02-12 NOTE — Progress Notes (Signed)
NIF -40 VC 2.4 patient had good effort.

## 2018-02-12 NOTE — Progress Notes (Signed)
NIF and VC performed per MD order.   NIF greater than -40cmH2O and VC of 2.3L with good effort.

## 2018-02-12 NOTE — Progress Notes (Addendum)
[facility]  Date of Admission:  02/06/2018   Total days of antibiotics 7        Day 4 ciprofloxacin         ASSESSMENT: 67 year-old male, immunocompromised,with atypical pneumonia. 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 Blood cultures are no growth at 5 days. We have filled out paperwork to send tularemia antibodies to [facility]. PCP DFA is negative but PCP PNA is still on the differential. LIP is also possible given his autoimmune history. We need a bronchoscopy to identify a cause but that has been deferred at this time for risk of intubation. He seems to be doing somewhat better, so we will continue current treatment.   PLAN: 1. Continue ciprofloxacin 2. Hold off bronch for now 3. F/u BCx  Principal Problem:   Sepsis (HCC) Active Problems:   CAD (coronary artery disease)   COPD (chronic obstructive pulmonary disease) (HCC)   Depression with anxiety   GERD (gastroesophageal reflux disease)   Hypothyroidism   Myasthenia gravis (HCC)   PE (pulmonary thromboembolism) (HCC)   Tobacco abuse   Multifocal pneumonia   Respiratory failure (HCC)   Scheduled Meds: . aspirin EC  81 mg Oral Daily  . atorvastatin  40 mg Oral QHS  . azelastine  2 spray Each Nare BID  . baclofen  10 mg Oral TID  . benzonatate  200 mg Oral TID  . buPROPion  150 mg Oral BID  . calcium-vitamin D  1 tablet Oral Daily  . chlorhexidine  15 mL Mouth Rinse BID  . fluticasone  1 spray Each Nare BID  . Influenza vac split quadrivalent PF  0.5 mL Intramuscular Tomorrow-1000  . ipratropium-albuterol  3 mL Inhalation Q6H  . levothyroxine  175 mcg Oral QAC breakfast  . mouth rinse  15 mL Mouth Rinse q12n4p  . mirtazapine  30 mg Oral QHS  . montelukast  10 mg Oral QHS  . morphine  15 mg Oral QHS  . multivitamin  1 tablet Oral BID  . nicotine  21 mg Transdermal Daily  . pantoprazole  40 mg Oral Daily  . pregabalin  50 mg Oral QHS  . pyridostigmine  30 mg Oral QID   . sodium chloride flush  10-40 mL Intracatheter Q12H  . tiotropium  18 mcg Inhalation Daily  . venlafaxine  225 mg Oral Daily  . vitamin B-12  1,000 mcg Oral Daily   Continuous Infusions: . sodium chloride 100 mL/hr at 02/12/18 0853  . ciprofloxacin Stopped (02/12/18 0442)  . heparin 1,350 Units/hr (02/12/18 0700)   PRN Meds:.acetaminophen, albuterol, guaiFENesin-dextromethorphan, hydrALAZINE, HYDROcodone-acetaminophen, ondansetron, sodium chloride flush, zolpidem   SUBJECTIVE: Curtis Preston reports feeling better and that his breathing capacity is better. The cough however has been heavier and more productive and blood-tinged today. He is feeling congested and reports dry sinuses and some nose bleeding when he blows his nose. He also report feeling dizzy this morning associated with rapid head movement.   Review of Systems: Review of Systems  Constitutional: Negative for chills, diaphoresis and fever.  HENT: Positive for congestion.   Respiratory: Positive for cough, hemoptysis, sputum production and shortness of breath.   Cardiovascular: Negative for chest pain.  Gastrointestinal: Negative.   Neurological: Positive for dizziness.    Allergies  Allergen Reactions  . Hydromorphone Other (See Comments)    hallucinations   . Immune Globulin Other (See Comments)     Bilateral  pulmonary embolism      OBJECTIVE: Vitals:   02/12/18 0600 02/12/18 0700 02/12/18 0738 02/12/18 0827  BP: 113/60 121/60    Pulse:  86    Resp:  (!) 28    Temp:   98.6 F (37 C)   TempSrc:   Oral   SpO2:  93%  100%  Weight:      Height:       Body mass index is 22.19 kg/m.  Physical Exam  Constitutional: He is oriented to person, place, and time. He appears ill. No distress.  Neck: Neck supple.  Cardiovascular: Normal rate, regular rhythm and normal heart sounds.  No murmur heard. Pulmonary/Chest: Tachypnea noted. He has rales in the right lower field and the left lower field.  Abdominal:  Soft. Bowel sounds are normal. He exhibits no distension. There is no tenderness.  Neurological: He is alert and oriented to person, place, and time.  Skin: Skin is warm.    Lab Results Lab Results  Component Value Date   WBC 6.7 02/12/2018   HGB 9.6 (L) 02/12/2018   HCT 30.2 (L) 02/12/2018   MCV 100.3 (H) 02/12/2018   PLT 343 02/12/2018    Lab Results  Component Value Date   CREATININE 0.77 02/12/2018   BUN 8 02/12/2018   NA 138 02/12/2018   K 3.4 (L) 02/12/2018   CL 105 02/12/2018   CO2 25 02/12/2018    Lab Results  Component Value Date   ALT 29 02/10/2018   AST 36 02/10/2018   ALKPHOS 110 02/10/2018   BILITOT 0.8 02/10/2018     Microbiology: Recent Results (from the past 240 hour(s))  Blood culture (routine x 2)     Status: None (Preliminary result)   Collection Time: 02/06/18 11:35 PM  Result Value Ref Range Status   Specimen Description BLOOD RIGHT ANTECUBITAL  Final   Special Requests   Final    BOTTLES DRAWN AEROBIC AND ANAEROBIC Blood Culture adequate volume   Culture   Final    HOLDING FOR TEN DAYS CORRECTED ON 09/12 AT [id number]: PREVIOUSLY REPORTED AS NO GROWTH 5 DAYS Performed at [facility], [address] [place], [place] [address]    Report Status PENDING  Incomplete  Blood culture (routine x 2)     Status: None (Preliminary result)   Collection Time: 02/06/18 11:45 PM  Result Value Ref Range Status   Specimen Description BLOOD LEFT HAND  Final   Special Requests   Final    BOTTLES DRAWN AEROBIC AND ANAEROBIC Blood Culture adequate volume   Culture   Final    HOLDING FOR TEN DAYS CORRECTED ON 09/12 AT 0837: PREVIOUSLY REPORTED AS NO GROWTH 5 DAYS Performed at [facility], [address] [place], [place] [address]    Report Status PENDING  Incomplete  Respiratory Panel by PCR     Status: None   Collection Time: 02/07/18  8:24 AM  Result Value Ref Range Status   Adenovirus NOT DETECTED NOT DETECTED Final   Coronavirus 229E NOT  DETECTED NOT DETECTED Final   Coronavirus HKU1 NOT DETECTED NOT DETECTED Final   Coronavirus NL63 NOT DETECTED NOT DETECTED Final   Coronavirus OC43 NOT DETECTED NOT DETECTED Final   Metapneumovirus NOT DETECTED NOT DETECTED Final   Rhinovirus / Enterovirus NOT DETECTED NOT DETECTED Final   Influenza A NOT DETECTED NOT DETECTED Final   Influenza B NOT DETECTED NOT DETECTED Final   Parainfluenza Virus 1 NOT DETECTED NOT  DETECTED Final   Parainfluenza Virus 2 NOT DETECTED NOT DETECTED Final   Parainfluenza Virus 3 NOT DETECTED NOT DETECTED Final   Parainfluenza Virus 4 NOT DETECTED NOT DETECTED Final   Respiratory Syncytial Virus NOT DETECTED NOT DETECTED Final   Bordetella pertussis NOT DETECTED NOT DETECTED Final   Chlamydophila pneumoniae NOT DETECTED NOT DETECTED Final   Mycoplasma pneumoniae NOT DETECTED NOT DETECTED Final    Comment: Performed at [facility], [address] [place], [place] [address]  MRSA PCR Screening     Status: None   Collection Time: 02/09/18  2:56 PM  Result Value Ref Range Status   MRSA by PCR NEGATIVE NEGATIVE Final    Comment:        The GeneXpert MRSA Assay (FDA approved for NASAL specimens only), is one component of a comprehensive MRSA colonization surveillance program. It is not intended to diagnose MRSA infection nor to guide or monitor treatment for MRSA infections. Performed at [facility], [address] [place], [place] [address]   Pneumocystis smear by DFA     Status: None   Collection Time: 02/10/18  7:32 PM  Result Value Ref Range Status   Specimen Source-PJSRC SPUTUM  Final   Pneumocystis jiroveci Ag NEGATIVE  Final    Comment: Performed at [facility] Performed at [facility], [address], [place], [place] [address]     Gweneth Fritter, [facility] for Infectious Disease [facility] [phone] pager    02/12/2018, 9:10 AM

## 2018-02-12 NOTE — Progress Notes (Signed)
NIF-40 VC 2.4 .. Performed by RT patient had great effort

## 2018-02-12 NOTE — Plan of Care (Signed)
  Problem: Education: Goal: Knowledge of General Education information will improve Description Including pain rating scale, medication(s)/side effects and non-pharmacologic comfort measures Outcome: Progressing   Problem: Clinical Measurements: Goal: Ability to maintain clinical measurements within normal limits will improve Outcome: Progressing   Problem: Coping: Goal: Level of anxiety will decrease Outcome: Progressing   Problem: Pain Managment: Goal: General experience of comfort will improve Outcome: Progressing  Pt given bedtime dose of MS Contin Problem: Safety: Goal: Ability to remain free from injury will improve Outcome: Progressing   Problem: Safety: Goal: Ability to remain free from injury will improve Outcome: Progressing   Problem: Skin Integrity: Goal: Risk for impaired skin integrity will decrease Outcome: Progressing

## 2018-02-12 NOTE — Significant Event (Signed)
Rapid Response Event Note  Overview: Time Called: 2040 Arrival Time: 2050 Event Type: Respiratory Called by RRT for acute hypoxia  Initial Focused Assessment: Upon arrival, Mr. Curtis Preston is alert, oriented x4 on NRB.  He states he is SOB and has some soreness across his upper abdomen/diaphragm.  Denies CP.  Afebrile, HR 104 ST, BP 144/81, RR 20-22 with sats 99% on NRB. Productive cough with tan secretions. Reported low sats on 88-91% on VM.  NiF -40.  BBS Clear and diminished in the bases.  Curtis Preston ACNP and Dr. Oval Preston on department and came to bedside.  Interventions: Orders from PCCM: -Stat PCXR -ABG -Lasix 20mg  IV now  Plan of Care (if not transferred): -Incentive Spirometry when awake -Mobilize to chair when appropriate -Assess response from Lasix  -Wean Fio2 for sats >92% -Notify primary svc/RRRN of further clinical decompensations  Event Summary: Pt stabilized in room Call ended 2128  Curtis Preston, Curtis Preston

## 2018-02-12 NOTE — Progress Notes (Signed)
Patient having nasal discomfort. Placed patient on Venti mask 10L. Patient comfortable

## 2018-02-12 NOTE — Progress Notes (Signed)
Patient satting 88-91% on 10L Venti mask with increased WOB RT placed patient on 100% NRB  Patient satting 100%.  RT will titrate down.  Rapid has been called to bedside.

## 2018-02-12 NOTE — Care Management Note (Addendum)
Case Management Note  Patient Details  Name: Curtis Preston MRN: [id number] Date of Birth: 1951/05/08  Subjective/Objective:  67 year old male presented with worsening SOB and productive cough. PMH which is significant for Myasthenia gravis on imuran, COPD, PE on Xarelto, and CAD. Patient traveled  to [place] from [place] (where he lives) to visit his family. Patient is followed by [facility] [phone], unsure of his PCP's name; [facility]: Dr. Jani Files [phone]. Rx are obtained through [facility]. Patient indicated he lives at home with his fiancee, independent with ADLs PTA, and he occasionally utilizes a Programmer, multimedia.                  Action/Plan: Upon speaking to Dr. Nelda Marseille, patient wants to return back to [place], with assistance from CM requested. Patient is currently on HFO2 @ 8L, with transport via airplane not an option at this time d/t patient's current high O2 requirement. Non-emergent ambulance transport isn't covered by Medicare, with cost based on mileage, with payment often required upfront-oxygen cost required for transport would be separate from transport cost. CM met with patient to discuss transitional needs. Patient informed CM of his Veteran status, and suggested [place] could possibly assist with providing ambulance transportation. Patient also indicated he could ask his brother (who lives locally), if he's agreeable to providing car transport to [place], and will update CM. Patient denied having home O2 PTA; CM can assist with the arrangement of home/portable O2 but would require a <O2 liter flow, qualifying O2 sats to qualify for a portable concentrator. CM will contact patient's [place] providers, as requested, to inquire if transport arrangement is an option and will update patient/attending once determined.  CM will continue to follow.   Expected Discharge Date:  02/08/18               Expected Discharge  Plan:  (TBD)  In-House Referral:  NA  Discharge planning Services  CM Consult, DC out of service area  Post Acute Care Choice:  NA Choice offered to:  NA  DME Arranged:  N/A DME Agency:  NA  HH Arranged:  NA HH Agency:  NA  Status of Service:  In process, will continue to follow  If discussed at Long Length of Stay Meetings, dates discussed:    Additional Comments: 02/13/18 @ 1538-Alicianna Litchford RNCM-Call received from April, [place] transfer coordinator. [employer], SW for [facility] will be the point of contact to assist with VA benefit needs. [phone]. [phone]/pager: [phone].   02/12/18 @ 1454-Nyko Gell RNCM-CM spoke to the transfer coordinator for [facility] to inquire if transportation could be arranged under patient's VA benefits. CM was informed to contact the transport coordinator at the nearest [place] facility to review patient's transfer benefits, then initiate the transportation if coverage was determined. [facility] [place] PCP was verified as: Dr. Delynn Flavin @ [phone]; Case Manager: Cora Daniels @ [phone]: [phone]. CM spoke to April, transfer coordinator with [facility]; patient's information must be entered/loaded into [facility] system prior to verifying benefits. April will f/u with CM on 02/13/18 once his benefits are determined. CM updated patient and will continue to follow.   Midge Minium RN, BSN, NCM-BC, ACM-RN [phone] 02/12/2018, 11:44 AM

## 2018-02-12 NOTE — Progress Notes (Signed)
Curtis Preston  WUJ:[id number] DOB: 02/26/1951 DOA: 02/06/2018 PCP: No primary care provider on file.    LOS: 4 days   Reason for Consult / Chief Complaint:  Pneumonia in immunocompromised patient.   Consulting MD and date of consult:  Dr. Waymon AmatoHongalgi  HPI/summary of hospital stay:  67 year old male with PMH as below, which is significant for Myasthenia gravis on imuran, COPD, PE on Xarelto, and CAD. He was in his usual state of health (gets winded daily) until about early August of this year, when he developed worsening shortness of breath with associated productive cough (green sputum). This persisted for about 6 weeks, until about 9/6, when his symptoms significant worsened after traveling here to [place] from [place] (where he lives). SOB worsened as did his productive cough, which caused him to present to ED. Workup in ED suggested pneumonia and he was started on empiric antibiotics. CT demonstrated bilateral ground glass, and most concerning for multi-focal pneumonia. Infectious disease consultation sees benefit in bronchoscopy for culture. PCCM consulted and recommended transfer to the intensive care unit for monitoring of respiratory status.  Subjective  No events overnight, HFNC flow rate is improving  I reviewed CXR myself, bilateral pulmonary infiltrate noted  Assessment & Plan:   Acute hypoxemic respiratory failure secondary to community acquired pneumonia in an immunosuppressed patient, possible atypical organism Acute hypoxemic respiratory failure - CT imaging with BL GGO and multiple intraparenchymal cyst/pneumatocele formation, LDH 311, would consider the diagnosis of PJP pneumonia  - LIP would be in the differential as well, BL GGO with cyst formation in a patient with Sjogrens syndrome  - Continue antimicrobial regimen per infectious disease - PCP negative - Restart xarelto since no bronch for now - Hold off bronchoscopy for now - Heart healthy diabetic diet -  Titrate O2 for sat of 88-92% - Will need to consult with case management, patient wants to go back to [place] but transportation on such high flow ill be an issue.  Myasthenia Gravis, with possible exacerbation  - Management at this point per neurology - Agree that with history of PE, IVIG would increase his risk of recurrent VTE, will hold ofF IVIG at this point - Plasma exchange may be the best option but patient is stable from a MG stantpoint - He will need an HD cath and CCM can place this if needed, no need for now - If NIF falls below -20 or VC <30cc/kg for him would be less than 2.2L then we will consider intubation and mechanical support. - The ciprofloxacin will likely continue to exacerbate his MG, will defer to ID  Hemoptysis - Stable, improved   COPD - Continue scheduled duonebs   [facility] with PCCM following as consult starting 9/13  Consultants: date of consult/date signed off (if applicable)/final recs  ID  Procedures:  Significant Diagnostic Tests: CT chest 9/9 > Extensive confluent ground-glass attenuation throughout both lungs with consolidation more prevalent in the superior segment of the left lower lobe. The findings are nonspecific, but most concerning for multi lobar pneumonia. Edema or possibly could have a similar appearance. This is to extensive for atelectasis. Consolidation in the superior segment in the left lower lobe suggests infection.  Micro Data: Blood 9/6 > Sputum 9/6 > RVP 9/7: Negative  Antimicrobials:  Azithromycin 9/6 > 9/9 Cefepime 9/6 > 9/9  Vancomycin 9/6>>>9/9 Ciprofloxacin 9/9>>>  Objective    Examination: General appearance: 67 y.o., male, NAD, conversant, apperars  comfortable in bed  Eyes: anicteric sclerae, moist conjunctivae; tracking appropriately  HENT: NCAT; oropharynx, MMM, no mucosal ulcerations; Neck: Trachea midline; no JVD  Lungs: BL rhonchi, no wheeze  CV: RRR, S1, S2, distant heart tones, no  overt murmur  Abdomen: Soft, non-tender; non-distended, BS present  Extremities: No peripheral edema or radial and DP pulses present bilaterally  Skin: Normal temperature, turgor and texture; no rash Psych: Appropriate affect, normal mood  Neuro: Alert and oriented to person and place, no focal deficit    Blood pressure 121/60, pulse 86, temperature 98.6 F (37 C), temperature source Oral, resp. rate (!) 28, height 6\' 1"  (1.854 m), weight 76.3 kg, SpO2 100 %.    FiO2 (%):  [40 %] 40 %   Intake/Output Summary (Last 24 hours) at 02/12/2018 0915 Last data filed at 02/12/2018 0738 Gross per 24 hour  Intake 3203.73 ml  Output 3615 ml  Net -411.27 ml   Filed Weights   02/09/18 0606 02/10/18 0500 02/11/18 0530  Weight: 76.3 kg 76.4 kg 76.3 kg   Labs    CBC: Recent Labs  Lab 02/06/18 2028 02/08/18 0340 02/10/18 0359 02/11/18 1804 02/12/18 0500  WBC 13.3* 11.8* 9.2 7.6 6.7  NEUTROABS 11.6*  --  7.3  --   --   HGB 13.8 11.5* 11.4* 10.0* 9.6*  HCT 41.4 34.3* 33.8* 30.8* 30.2*  MCV 96.3 97.2 97.7 98.7 100.3*  PLT 188 183 226 296 343   Basic Metabolic Panel: Recent Labs  Lab 02/06/18 2028 02/10/18 0359 02/12/18 0500  NA 138 138 138  K 4.3 4.1 3.4*  CL 105 102 105  CO2 21* 26 25  GLUCOSE 105* 112* 89  BUN 15 7* 8  CREATININE 1.17 0.89 0.77  CALCIUM 8.5* 8.1* 7.5*  MG  --   --  2.0  PHOS  --   --  3.1   GFR: Estimated Creatinine Clearance: 94.1 mL/min (by C-G formula based on SCr of 0.77 mg/dL). Recent Labs  Lab 02/07/18 0034 02/07/18 0657 02/08/18 0340 02/10/18 0359 02/11/18 1804 02/12/18 0500  PROCALCITON 0.16  --   --   --   --   --   WBC  --   --  11.8* 9.2 7.6 6.7  LATICACIDVEN 1.4 1.9  --   --   --   --    Liver Function Tests: Recent Labs  Lab 02/10/18 0359  AST 36  ALT 29  ALKPHOS 110  BILITOT 0.8  PROT 5.4*  ALBUMIN 2.0*   No results for input(s): LIPASE, AMYLASE in the last 168 hours. No results for input(s): AMMONIA in the last 168  hours. ABG    Component Value Date/Time   PHART 7.470 (H) 02/09/2018 1344   PCO2ART 33.7 02/09/2018 1344   PO2ART 67.8 (L) 02/09/2018 1344   HCO3 24.2 02/09/2018 1344   O2SAT 94.3 02/09/2018 1344    Coagulation Profile: No results for input(s): INR, PROTIME in the last 168 hours. Cardiac Enzymes: No results for input(s): CKTOTAL, CKMB, CKMBINDEX, TROPONINI in the last 168 hours. HbA1C: No results found for: HGBA1C CBG: No results for input(s): GLUCAP in the last 168 hours.  CXR: reviewed - BL infiltrates present   CT Chest: BL GGO with intraparenchymal cyst formation   Discussed with PCCM-NP and TRH-MD.  Alyson Reedy, M.D. [facility] Pulmonary/Critical Care Medicine. Pager: [phone]. After hours pager: [phone].

## 2018-02-12 NOTE — Progress Notes (Signed)
ANTICOAGULATION CONSULT NOTE   Pharmacy Consult for Heparin Indication: history of pulmonary embolus  Allergies  Allergen Reactions  . Hydromorphone Other (See Comments)    hallucinations   . Immune Globulin Other (See Comments)     Bilateral pulmonary embolism      Patient Measurements: Height: 6\' 1"  (185.4 cm) Weight: 168 lb 3.4 oz (76.3 kg) IBW/kg (Calculated) : 79.9 HEPARIN DW (KG): 76.3   Vital Signs: Temp: 98.8 F (37.1 C) (09/12 0330) Temp Source: Oral (09/12 0330) BP: 113/60 (09/12 0600) Pulse Rate: 80 (09/12 0500)  Labs: Recent Labs    02/10/18 0359 02/10/18 1404 02/11/18 1804 02/12/18 0315 02/12/18 0500  HGB 11.4*  --  10.0*  --  9.6*  HCT 33.8*  --  30.8*  --  30.2*  PLT 226  --  296  --  343  APTT  --  41* 63*  --   --   HEPARINUNFRC  --  0.20* 0.14* 0.44  --   CREATININE 0.89  --   --   --   --     Estimated Creatinine Clearance: 84.5 mL/min (by C-G formula based on SCr of 0.89 mg/dL).   Medical History: Past Medical History:  Diagnosis Date  . Bronchiectasis (HCC)   . CAD (coronary artery disease)   . COPD (chronic obstructive pulmonary disease) (HCC)   . Depression with anxiety   . GERD (gastroesophageal reflux disease)   . Hypothyroidism   . Myasthenia gravis (HCC)   . PE (pulmonary thromboembolism) (HCC)   . Sjogren's disease (HCC)   . Tobacco abuse     Medications:  Scheduled:  . aspirin EC  81 mg Oral Daily  . atorvastatin  40 mg Oral QHS  . azelastine  2 spray Each Nare BID  . baclofen  10 mg Oral TID  . benzonatate  200 mg Oral TID  . buPROPion  150 mg Oral BID  . calcium-vitamin D  1 tablet Oral Daily  . chlorhexidine  15 mL Mouth Rinse BID  . fluticasone  1 spray Each Nare BID  . Influenza vac split quadrivalent PF  0.5 mL Intramuscular Tomorrow-1000  . ipratropium-albuterol  3 mL Inhalation Q6H  . levothyroxine  175 mcg Oral QAC breakfast  . mouth rinse  15 mL Mouth Rinse q12n4p  . mirtazapine  30 mg Oral QHS  .  montelukast  10 mg Oral QHS  . morphine  15 mg Oral QHS  . multivitamin  1 tablet Oral BID  . nicotine  21 mg Transdermal Daily  . pantoprazole  40 mg Oral Daily  . pregabalin  50 mg Oral QHS  . pyridostigmine  30 mg Oral QID  . sodium chloride flush  10-40 mL Intracatheter Q12H  . tiotropium  18 mcg Inhalation Daily  . venlafaxine  225 mg Oral Daily  . vitamin B-12  1,000 mcg Oral Daily   Infusions:  . sodium chloride 100 mL/hr at 02/12/18 0600  . ciprofloxacin 400 mg (02/12/18 0337)  . heparin 1,350 Units/hr (02/12/18 0600)    Assessment: Pt is a [age] yoM with a history of PE 10/2016. Pt was on rivaroxaban PTA, last dose 9/9. Patient started on heparin gtt in the interim.   Patient was started on heparin 1200 units/hr on 9/10, was discontinued in anticipation of bronch on 9/11. No longer planning for bronch, so heparin was restarted. No longer following aPTT given adequate renal function and time for rivaroxaban washout.   Heparin level therapeutic at  0.44 at 1350 units/hr. Hgb 9.6, plt 343. Hemoglobin trending down likely 2/2 to fluids.  No s/sx of bleeding. No infusion issues per nursing.   Goal of Therapy: Heparin level 0.3-0.7 units/ml Monitor platelets by anticoagulation protocol: Yes   Plan:  - Continue heparin infusion at 1350 units/hr - Check anti-Xa level in 6 hours - Continue to monitor H&H and platelets  Chauncey Mann, Pharmacy Student

## 2018-02-12 NOTE — Progress Notes (Signed)
PCCM INTERVAL PROGRESS NOTE   Called to bedside to evaluate patient for worsening hypoxia. Now using 15L non-rebreather, on which he seems pretty comfortable. CXR ordered is concerning for worsening opacifications. He is being treated with empiric antibiotics although ID is concerned this may be an inflammatory process. Notably tonight his volume balance is 6L + this admission.   Plan: ABG Lasix 20mg  Re-assess after lasix.   Joneen RoachPaul Ajmal Kathan, AGACNP-BC [facility]/Critical Care Pager [phone] or [phone]  02/12/2018 10:22 PM

## 2018-02-12 NOTE — Progress Notes (Signed)
ANTICOAGULATION CONSULT NOTE   Pharmacy Consult for Heparin > Xarelto Indication: history of pulmonary embolus  Allergies  Allergen Reactions  . Hydromorphone Other (See Comments)    hallucinations   . Immune Globulin Other (See Comments)     Bilateral pulmonary embolism      Patient Measurements: Height: 6\' 1"  (185.4 cm) Weight: 168 lb 3.4 oz (76.3 kg) IBW/kg (Calculated) : 79.9 HEPARIN DW (KG): 76.3   Vital Signs: Temp: 97.6 F (36.4 C) (09/12 1341) Temp Source: Oral (09/12 1341) BP: 140/87 (09/12 1341) Pulse Rate: 88 (09/12 1341)  Labs: Recent Labs    02/10/18 0359  02/10/18 1404 02/11/18 1804 02/12/18 0315 02/12/18 0500 02/12/18 0948  HGB 11.4*  --   --  10.0*  --  9.6*  --   HCT 33.8*  --   --  30.8*  --  30.2*  --   PLT 226  --   --  296  --  343  --   APTT  --   --  41* 63*  --   --   --   HEPARINUNFRC  --    < > 0.20* 0.14* 0.44  --  1.90*  CREATININE 0.89  --   --   --   --  0.77  --    < > = values in this interval not displayed.    Estimated Creatinine Clearance: 94.1 mL/min (by C-G formula based on SCr of 0.77 mg/dL).   Medical History: Past Medical History:  Diagnosis Date  . Bronchiectasis (HCC)   . CAD (coronary artery disease)   . COPD (chronic obstructive pulmonary disease) (HCC)   . Depression with anxiety   . GERD (gastroesophageal reflux disease)   . Hypothyroidism   . Myasthenia gravis (HCC)   . PE (pulmonary thromboembolism) (HCC)   . Sjogren's disease (HCC)   . Tobacco abuse     Medications:  Scheduled:  . aspirin EC  81 mg Oral Daily  . atorvastatin  40 mg Oral QHS  . azelastine  2 spray Each Nare BID  . baclofen  10 mg Oral TID  . benzonatate  200 mg Oral TID  . buPROPion  150 mg Oral BID  . calcium-vitamin D  1 tablet Oral Daily  . chlorhexidine  15 mL Mouth Rinse BID  . fluticasone  1 spray Each Nare BID  . Influenza vac split quadrivalent PF  0.5 mL Intramuscular Tomorrow-1000  . ipratropium-albuterol  3 mL  Inhalation Q6H  . [START ON 02/13/2018] levothyroxine  175 mcg Oral QAC breakfast  . mouth rinse  15 mL Mouth Rinse q12n4p  . mirtazapine  30 mg Oral QHS  . montelukast  10 mg Oral QHS  . morphine  15 mg Oral QHS  . multivitamin  1 tablet Oral BID  . nicotine  21 mg Transdermal Daily  . pantoprazole  40 mg Oral Daily  . pregabalin  50 mg Oral QHS  . pyridostigmine  30 mg Oral QID  . sodium chloride flush  10-40 mL Intracatheter Q12H  . tiotropium  18 mcg Inhalation Daily  . venlafaxine  225 mg Oral Daily  . vitamin B-12  1,000 mcg Oral Daily   Infusions:  . sodium chloride 100 mL/hr at 02/12/18 1200  . ciprofloxacin Stopped (02/12/18 0442)  . heparin 1,350 Units/hr (02/12/18 1322)    Assessment: Pt is a 49 yoM with a history of PE 10/2016. Pt was on rivaroxaban PTA, last dose 9/9. Patient started on heparin  gtt in the interim.   Patient was started on heparin 1200 units/hr on 9/10, was discontinued in anticipation of bronch on 9/11. No longer planning for bronch, so heparin was restarted. No longer following aPTT given adequate renal function and time for rivaroxaban washout.   Heparin level therapeutic at 0.44 at 1350 units/hr. Hgb 9.6, plt 343. Hemoglobin trending down  Pharmacy asked to change heparin to xarelto this evening.  Goal of Therapy: Heparin level 0.3-0.7 units/ml Monitor platelets by anticoagulation protocol: Yes   Plan:  Stop heparin at 5 pm. Start Xarelto 20 mg at 5 pm.  Reece LeaderJessica Daniya Aramburo, Colon FlatteryPharm D, BCPS, [facility] Clinical Pharmacist Phone [phone]  02/12/2018 3:24 PM

## 2018-02-13 ENCOUNTER — Inpatient Hospital Stay (HOSPITAL_COMMUNITY): Payer: Medicare HMO

## 2018-02-13 DIAGNOSIS — J80 Acute respiratory distress syndrome: Secondary | ICD-10-CM

## 2018-02-13 DIAGNOSIS — J9601 Acute respiratory failure with hypoxia: Secondary | ICD-10-CM

## 2018-02-13 DIAGNOSIS — R1310 Dysphagia, unspecified: Secondary | ICD-10-CM

## 2018-02-13 LAB — MAGNESIUM: Magnesium: 2.1 mg/dL (ref 1.7–2.4)

## 2018-02-13 LAB — POCT I-STAT 3, ART BLOOD GAS (G3+)
Acid-Base Excess: 2 mmol/L (ref 0.0–2.0)
BICARBONATE: 27.5 mmol/L (ref 20.0–28.0)
O2 SAT: 100 %
PCO2 ART: 49.1 mmHg — AB (ref 32.0–48.0)
PH ART: 7.356 (ref 7.350–7.450)
PO2 ART: 492 mmHg — AB (ref 83.0–108.0)
Patient temperature: 98.6
TCO2: 29 mmol/L (ref 22–32)

## 2018-02-13 LAB — GLUCOSE, CAPILLARY
GLUCOSE-CAPILLARY: 112 mg/dL — AB (ref 70–99)
Glucose-Capillary: 147 mg/dL — ABNORMAL HIGH (ref 70–99)

## 2018-02-13 LAB — BASIC METABOLIC PANEL
Anion gap: 11 (ref 5–15)
BUN: 10 mg/dL (ref 8–23)
CALCIUM: 8.1 mg/dL — AB (ref 8.9–10.3)
CHLORIDE: 99 mmol/L (ref 98–111)
CO2: 27 mmol/L (ref 22–32)
Creatinine, Ser: 0.84 mg/dL (ref 0.61–1.24)
GFR calc Af Amer: >60 mL/min (ref >60)
Glucose, Bld: 102 mg/dL — ABNORMAL HIGH (ref 70–99)
POTASSIUM: 4.1 mmol/L (ref 3.5–5.1)
SODIUM: 137 mmol/L (ref 135–145)

## 2018-02-13 LAB — CBC
HCT: 32.7 % — ABNORMAL LOW (ref 39.0–52.0)
HEMOGLOBIN: 10.5 g/dL — AB (ref 13.0–17.0)
MCH: 31.7 pg (ref 26.0–34.0)
MCHC: 32.1 g/dL (ref 30.0–36.0)
MCV: 98.8 fL (ref 78.0–100.0)
Platelets: 399 10*3/uL (ref 150–400)
RBC: 3.31 MIL/uL — AB (ref 4.22–5.81)
RDW: 13.1 % (ref 11.5–15.5)
WBC: 10.7 10*3/uL — ABNORMAL HIGH (ref 4.0–10.5)

## 2018-02-13 LAB — MISC LABCORP TEST (SEND OUT): Labcorp test code: 99895

## 2018-02-13 LAB — PHOSPHORUS: PHOSPHORUS: 4.1 mg/dL (ref 2.5–4.6)

## 2018-02-13 MED ORDER — DEXMEDETOMIDINE HCL IN NACL 400 MCG/100ML IV SOLN
0.0000 ug/kg/h | INTRAVENOUS | Status: DC
  Administered 2018-02-13 – 2018-02-14 (×3): 1.2 ug/kg/h via INTRAVENOUS
  Administered 2018-02-14: 1 ug/kg/h via INTRAVENOUS
  Administered 2018-02-14 (×3): 1.2 ug/kg/h via INTRAVENOUS
  Administered 2018-02-15: 1.5 ug/kg/h via INTRAVENOUS
  Administered 2018-02-15 (×2): 1.2 ug/kg/h via INTRAVENOUS
  Administered 2018-02-15: 1.5 ug/kg/h via INTRAVENOUS
  Administered 2018-02-15: 1.2 ug/kg/h via INTRAVENOUS
  Filled 2018-02-13 (×12): qty 100

## 2018-02-13 MED ORDER — CALCIUM CARBONATE-VITAMIN D 500-200 MG-UNIT PO TABS
1.0000 | ORAL_TABLET | Freq: Every day | ORAL | Status: DC
  Administered 2018-02-14 – 2018-02-24 (×11): 1
  Filled 2018-02-13 (×11): qty 1

## 2018-02-13 MED ORDER — CHLORHEXIDINE GLUCONATE CLOTH 2 % EX PADS
6.0000 | MEDICATED_PAD | Freq: Every day | CUTANEOUS | Status: DC
  Administered 2018-02-13: 6 via TOPICAL

## 2018-02-13 MED ORDER — DEXMEDETOMIDINE HCL IN NACL 200 MCG/50ML IV SOLN
0.0000 ug/kg/h | INTRAVENOUS | Status: DC
  Administered 2018-02-13: 0.4 ug/kg/h via INTRAVENOUS
  Filled 2018-02-13: qty 50

## 2018-02-13 MED ORDER — ORAL CARE MOUTH RINSE
15.0000 mL | OROMUCOSAL | Status: DC
  Administered 2018-02-13 – 2018-02-22 (×89): 15 mL via OROMUCOSAL

## 2018-02-13 MED ORDER — CHLORHEXIDINE GLUCONATE 0.12% ORAL RINSE (MEDLINE KIT)
15.0000 mL | Freq: Two times a day (BID) | OROMUCOSAL | Status: DC
  Administered 2018-02-13 – 2018-02-22 (×16): 15 mL via OROMUCOSAL

## 2018-02-13 MED ORDER — VITAL AF 1.2 CAL PO LIQD: 1000.0000 mL | ORAL | Status: DC

## 2018-02-13 MED ORDER — NOREPINEPHRINE 4 MG/250ML-% IV SOLN
0.0000 ug/min | INTRAVENOUS | Status: DC
  Administered 2018-02-14: 2 ug/min via INTRAVENOUS
  Filled 2018-02-13: qty 250

## 2018-02-13 MED ORDER — LEVOTHYROXINE SODIUM 175 MCG PO TABS
175.0000 ug | ORAL_TABLET | Freq: Every day | ORAL | Status: DC
  Administered 2018-02-14 – 2018-02-24 (×9): 175 ug
  Filled 2018-02-13 (×12): qty 1

## 2018-02-13 MED ORDER — ETOMIDATE 2 MG/ML IV SOLN
20.0000 mg | Freq: Once | INTRAVENOUS | Status: AC
  Administered 2018-02-13: 20 mg via INTRAVENOUS

## 2018-02-13 MED ORDER — RIVAROXABAN 20 MG PO TABS
20.0000 mg | ORAL_TABLET | Freq: Every day | ORAL | Status: DC
  Administered 2018-02-13 – 2018-02-23 (×11): 20 mg
  Filled 2018-02-13 (×12): qty 1

## 2018-02-13 MED ORDER — FENTANYL 2500MCG IN NS 250ML (10MCG/ML) PREMIX INFUSION
25.0000 ug/h | INTRAVENOUS | Status: DC
  Administered 2018-02-13: 50 ug/h via INTRAVENOUS
  Administered 2018-02-14: 400 ug/h via INTRAVENOUS
  Administered 2018-02-14 (×2): 200 ug/h via INTRAVENOUS
  Administered 2018-02-15 (×2): 400 ug/h via INTRAVENOUS
  Administered 2018-02-15: 200 ug/h via INTRAVENOUS
  Administered 2018-02-15: 400 ug/h via INTRAVENOUS
  Administered 2018-02-16: 375 ug/h via INTRAVENOUS
  Administered 2018-02-16: 400 ug/h via INTRAVENOUS
  Administered 2018-02-16: 375 ug/h via INTRAVENOUS
  Administered 2018-02-16: 400 ug/h via INTRAVENOUS
  Administered 2018-02-17: 275 ug/h via INTRAVENOUS
  Administered 2018-02-17 – 2018-02-18 (×3): 250 ug/h via INTRAVENOUS
  Administered 2018-02-18: 200 ug/h via INTRAVENOUS
  Administered 2018-02-19: 175 ug/h via INTRAVENOUS
  Administered 2018-02-20 (×2): 200 ug/h via INTRAVENOUS
  Administered 2018-02-21 – 2018-02-22 (×2): 150 ug/h via INTRAVENOUS
  Filled 2018-02-13 (×24): qty 250

## 2018-02-13 MED ORDER — VITAL AF 1.2 CAL PO LIQD
1500.0000 mL | ORAL | Status: DC
  Administered 2018-02-13 – 2018-02-22 (×5): 1500 mL
  Filled 2018-02-13 (×11): qty 1500

## 2018-02-13 MED ORDER — SODIUM CHLORIDE 0.9% FLUSH
10.0000 mL | INTRAVENOUS | Status: DC | PRN
  Administered 2018-02-13 – 2018-02-27 (×3): 10 mL
  Filled 2018-02-13 (×3): qty 40

## 2018-02-13 MED ORDER — HEPARIN SOD (PORK) LOCK FLUSH 100 UNIT/ML IV SOLN
500.0000 [IU] | INTRAVENOUS | Status: AC | PRN
  Administered 2018-02-13: 500 [IU]

## 2018-02-13 MED ORDER — DOCUSATE SODIUM 50 MG/5ML PO LIQD: 100.0000 mg | Freq: Two times a day (BID) | ORAL | Status: DC | PRN

## 2018-02-13 MED ORDER — PYRIDOSTIGMINE BROMIDE 60 MG PO TABS
30.0000 mg | ORAL_TABLET | Freq: Four times a day (QID) | ORAL | Status: DC
  Administered 2018-02-13 – 2018-02-24 (×40): 30 mg
  Filled 2018-02-13 (×49): qty 0.5

## 2018-02-13 MED ORDER — FENTANYL BOLUS VIA INFUSION
25.0000 ug | INTRAVENOUS | Status: DC | PRN
  Administered 2018-02-14 – 2018-02-22 (×18): 25 ug via INTRAVENOUS
  Filled 2018-02-13: qty 25

## 2018-02-13 MED ORDER — CHLORHEXIDINE GLUCONATE 0.12% ORAL RINSE (MEDLINE KIT)
15.0000 mL | Freq: Two times a day (BID) | OROMUCOSAL | Status: DC
  Administered 2018-02-13 – 2018-02-15 (×4): 15 mL via OROMUCOSAL

## 2018-02-13 MED ORDER — PRO-STAT SUGAR FREE PO LIQD
30.0000 mL | Freq: Every day | ORAL | Status: DC
  Administered 2018-02-13 – 2018-02-22 (×10): 30 mL
  Filled 2018-02-13 (×9): qty 30

## 2018-02-13 MED ORDER — BACLOFEN 10 MG PO TABS
10.0000 mg | ORAL_TABLET | Freq: Three times a day (TID) | ORAL | Status: DC
  Administered 2018-02-13 – 2018-02-24 (×31): 10 mg
  Filled 2018-02-13 (×36): qty 1

## 2018-02-13 MED ORDER — ROCURONIUM BROMIDE 50 MG/5ML IV SOLN
50.0000 mg | Freq: Once | INTRAVENOUS | Status: AC
  Administered 2018-02-13: 50 mg via INTRAVENOUS

## 2018-02-13 MED ORDER — IPRATROPIUM-ALBUTEROL 0.5-2.5 (3) MG/3ML IN SOLN
3.0000 mL | Freq: Four times a day (QID) | RESPIRATORY_TRACT | Status: DC
  Administered 2018-02-13 – 2018-02-22 (×36): 3 mL via RESPIRATORY_TRACT
  Filled 2018-02-13 (×36): qty 3

## 2018-02-13 MED ORDER — PROPOFOL 1000 MG/100ML IV EMUL: 0.0000 ug/kg/min | INTRAVENOUS | Status: DC

## 2018-02-13 MED ORDER — VITAMIN B-12 1000 MCG PO TABS
1000.0000 ug | ORAL_TABLET | Freq: Every day | ORAL | Status: DC
  Administered 2018-02-14 – 2018-02-24 (×11): 1000 ug
  Filled 2018-02-13 (×11): qty 1

## 2018-02-13 MED ORDER — FAMOTIDINE IN NACL 20-0.9 MG/50ML-% IV SOLN
20.0000 mg | Freq: Two times a day (BID) | INTRAVENOUS | Status: DC
  Administered 2018-02-13 – 2018-02-20 (×15): 20 mg via INTRAVENOUS
  Filled 2018-02-13 (×15): qty 50

## 2018-02-13 MED ORDER — MIDAZOLAM HCL 2 MG/2ML IJ SOLN
2.0000 mg | Freq: Once | INTRAMUSCULAR | Status: AC
  Administered 2018-02-13: 2 mg via INTRAVENOUS

## 2018-02-13 MED ORDER — FENTANYL CITRATE (PF) 100 MCG/2ML IJ SOLN: 50.0000 ug | Freq: Once | INTRAMUSCULAR | Status: AC

## 2018-02-13 MED ORDER — SODIUM CHLORIDE 0.9 % IV SOLN
INTRAVENOUS | Status: DC | PRN
  Administered 2018-02-13: 500 mL via INTRAVENOUS
  Administered 2018-02-18: 18:00:00 via INTRAVENOUS
  Administered 2018-02-19: 250 mL via INTRAVENOUS
  Administered 2018-02-19: 1000 mL via INTRAVENOUS

## 2018-02-13 MED ORDER — ORAL CARE MOUTH RINSE: 15.0000 mL | OROMUCOSAL | Status: DC

## 2018-02-13 MED ORDER — FENTANYL CITRATE (PF) 100 MCG/2ML IJ SOLN
100.0000 ug | Freq: Once | INTRAMUSCULAR | Status: AC
  Administered 2018-02-13: 100 ug via INTRAVENOUS

## 2018-02-13 MED ORDER — SULFAMETHOXAZOLE-TRIMETHOPRIM 400-80 MG/5ML IV SOLN
370.0000 mg | Freq: Three times a day (TID) | INTRAVENOUS | Status: AC
  Administered 2018-02-13 – 2018-02-17 (×14): 370 mg via INTRAVENOUS
  Filled 2018-02-13 (×16): qty 23.13

## 2018-02-13 MED ORDER — ATORVASTATIN CALCIUM 40 MG PO TABS
40.0000 mg | ORAL_TABLET | Freq: Every day | ORAL | Status: DC
  Administered 2018-02-13 – 2018-02-23 (×9): 40 mg
  Filled 2018-02-13 (×11): qty 1

## 2018-02-13 MED ORDER — FENTANYL CITRATE (PF) 100 MCG/2ML IJ SOLN
50.0000 ug | INTRAMUSCULAR | Status: DC | PRN
  Administered 2018-02-15: 50 ug via INTRAVENOUS

## 2018-02-13 MED ORDER — PREGABALIN 50 MG PO CAPS
50.0000 mg | ORAL_CAPSULE | Freq: Every day | ORAL | Status: DC
  Administered 2018-02-13 – 2018-02-23 (×9): 50 mg
  Filled 2018-02-13 (×9): qty 1

## 2018-02-13 MED ORDER — FENTANYL CITRATE (PF) 100 MCG/2ML IJ SOLN
50.0000 ug | INTRAMUSCULAR | Status: DC | PRN
  Administered 2018-02-13 – 2018-02-15 (×2): 50 ug via INTRAVENOUS

## 2018-02-13 MED ORDER — SODIUM CHLORIDE 0.9% FLUSH
10.0000 mL | Freq: Two times a day (BID) | INTRAVENOUS | Status: DC
  Administered 2018-02-13: 30 mL
  Administered 2018-02-14 – 2018-02-21 (×8): 10 mL
  Administered 2018-02-22: 20 mL
  Administered 2018-02-22 – 2018-02-28 (×8): 10 mL

## 2018-02-13 NOTE — Progress Notes (Signed)
Pt was unable to swallow this am.  Denied it was due to SOB.  Stated it was due to Myasthenia gravis.  Dr. Sharon SellerMcClung made aware. O2 sat 95-97% on 15L via non rebreather.  Curtis Preston,

## 2018-02-13 NOTE — Progress Notes (Addendum)
RT NOTE: Patient achieved NIF of -40 and VC of 2.0L with good patient effort. RT will continue to monitor.

## 2018-02-13 NOTE — Progress Notes (Signed)
Telephone conversation note  Spoke with the patient's neurologist Dr. Langston MaskerMorris from [place]. The patient is a very complicated history of myasthenia gravis. He also has a complicated interstitial lung disease history  From a neurological standpoint, does not appear to be in myasthenic crisis. It is okay to continue holding his Imuran for now. His primary neurologist and the medicine and pulmonology team over in the hospital [place] are in discussions with each other to consider the best immunosuppressive therapy at this time.  Given that he has had PEs on and off of anticoagulation, which I am not sure is true or not, I would be very reluctant to keep him off of anticoagulant even for doing plasma exchange catheter insertion.  As an outpatient, he is being considered for Rituxan instead of the Imuran per the primary neurologist.  All decisions will be made on outpatient appointments and do not need to be initiated at this time.  I do not think he needs plasma exchange at this time as discussed above with his primary neurologist as well.  I communicated with Dr. Sharon SellerMcclung who is his primary hospitalist today about this conversation that I had with the neurologist.   Neurology will be available as needed Please call with questions  -- Milon DikesAshish Empress Newmann, MD [facility] Pager: [phone] If 7pm to 7am, please call on call as listed on AMION.

## 2018-02-13 NOTE — Procedures (Signed)
Bronchoscopy Procedure Note Curtis Preston [id number] September 13, 1950  Procedure: Bronchoscopy Indications: Obtain specimens for culture and/or other diagnostic studies  Procedure Details Consent: Risks of procedure as well as the alternatives and risks of each were explained to the (patient/caregiver).  Consent for procedure obtained. Time Out: Verified patient identification, verified procedure, site/side was marked, verified correct patient position, special equipment/implants available, medications/allergies/relevent history reviewed, required imaging and test results available.  Performed  In preparation for procedure, patient was given 100% FiO2 and bronchoscope lubricated. Sedation: Benzodiazepines, Muscle relaxants, Etomidate and Fentanyl  Airway entered and the following bronchi were examined: RUL, RML, RLL, LUL, LLL and Bronchi.   BAL from the right lung Bronchoscope removed.  , Patient placed back on 100% FiO2 at conclusion of procedure.    Evaluation Hemodynamic Status: BP stable throughout; O2 sats: stable throughout Patient's Current Condition: stable Specimens:  Sent serosanguinous fluid Complications: No apparent complications Patient did tolerate procedure well.   [name] 02/13/2018

## 2018-02-13 NOTE — Progress Notes (Signed)
Initial Nutrition Assessment  DOCUMENTATION CODES:   Non-severe (moderate) malnutrition in context of chronic illness  INTERVENTION:   Vital AF 1.2 @ 60 ml/hr (1440 ml/day) via OG tube 30 ml Prostat daily  Provides: 1828 kcal, 123 grams protein, and 1167 ml free water.    NUTRITION DIAGNOSIS:   Moderate Malnutrition related to chronic illness(MG, COPD) as evidenced by moderate fat depletion, moderate muscle depletion.  GOAL:   Patient will meet greater than or equal to 90% of their needs  MONITOR:   TF tolerance, Vent status  REASON FOR ASSESSMENT:   Consult, Ventilator Enteral/tube feeding initiation and management  ASSESSMENT:   Pt with PMH of myasthenia gravis on imuran, COPD, PE on Xarelto, and CAD. Pt was admitted 9/6 after 6 weeks of productive cough now tx to ICU 9/13 with acute hypoxemic respiratory failure secondary to CAP in an immunosuppressed pt with possible atypical organism.    CT imaging with BL GGO and multiple intraparenchymal cyst/pneumatocele formation, LDH 311, would consider the diagnosis of PJP pneumonia   Patient is currently intubated on ventilator support MV: 11.8 L/min Temp (24hrs), Avg:98.5 F (36.9 C), Min:97.6 F (36.4 C), Max:99.3 F (37.4 C)  Medications reviewed and include: vitamin B 12 Labs reviewed BP: 66/50 MAP: 57 - per RN due to sedation which she is adjusting     NUTRITION - FOCUSED PHYSICAL EXAM:    Most Recent Value  Orbital Region  Severe depletion  Upper Arm Region  Unable to assess  Thoracic and Lumbar Region  Moderate depletion  Buccal Region  Unable to assess  Temple Region  Moderate depletion  Clavicle Bone Region  Unable to assess  Clavicle and Acromion Bone Region  Moderate depletion  Scapular Bone Region  Unable to assess  Dorsal Hand  Mild depletion  Patellar Region  Moderate depletion  Anterior Thigh Region  Moderate depletion  Posterior Calf Region  Moderate depletion  Edema (RD Assessment)  None   Hair  Reviewed  Eyes  Unable to assess  Mouth  Unable to assess  Skin  Unable to assess  Nails  Reviewed       Diet Order:   Diet Order            Diet NPO time specified  Diet effective now              EDUCATION NEEDS:   No education needs have been identified at this time  Skin:  Skin Assessment: Reviewed RN Assessment  Last BM:  9/9  Height:   Ht Readings from Last 1 Encounters:  02/09/18 6\' 1"  (1.854 m)    Weight:   Wt Readings from Last 1 Encounters:  02/13/18 73.7 kg    Ideal Body Weight:  83.6 kg  BMI:  Body mass index is 21.44 kg/m.  Estimated Nutritional Needs:   Kcal:  1897  Protein:  110-132 grams  Fluid:  > 1.8 L/day  Kendell BaneHeather Kenwood Rosiak RD, LDN, CNSC [phone] Pager [phone] After Hours Pager

## 2018-02-13 NOTE — Procedures (Signed)
NGT placement by MD and verified by auscultation under direct laryngoscopy  Alyson ReedyWesam G. Yacoub, M.D. [facility]. Pager: [phone]. After hours pager: [phone].

## 2018-02-13 NOTE — Progress Notes (Signed)
[place] for Infectious Disease    Date of Admission:  02/06/2018   Total days of antibiotics 8        Day 5 cipro           ID: Curtis Preston is a 67 y.o. male with respiratory distress in patient with seronegative MG,-predominant ocular/bulbar symptoms with ILD, hx of PE-from [place]- visiting family in [place] -cared for by [facility] Principal Problem:   Sepsis ([place]) Active Problems:   CAD (coronary artery disease)   COPD (chronic obstructive pulmonary disease) ([place])   Depression with anxiety   GERD (gastroesophageal reflux disease)   Hypothyroidism   Myasthenia gravis ([place])   PE (pulmonary thromboembolism) ([place])   Tobacco abuse   Multifocal pneumonia   Respiratory failure ([place])    Subjective: Patient complains of not able to swallow. He is undergoing NIP and VC testing at bedside with VC decrease to 2 down from 2.4 yesterday. He reports having weakness to legs that is ongoing. He has worsening tachypnea with O2 needs up to 15L today. He also reports being diaphoretic after having breathing tests   cxr last night shows worsening interstitial disease in RUL space per my read  Medications:  . aspirin EC  81 mg Oral Daily  . atorvastatin  40 mg Oral QHS  . azelastine  2 spray Each Nare BID  . baclofen  10 mg Oral TID  . benzonatate  200 mg Oral TID  . buPROPion  150 mg Oral BID  . calcium-vitamin D  1 tablet Oral Daily  . chlorhexidine  15 mL Mouth Rinse BID  . fluticasone  1 spray Each Nare BID  . Influenza vac split quadrivalent PF  0.5 mL Intramuscular Tomorrow-1000  . ipratropium-albuterol  3 mL Inhalation Q6H  . levothyroxine  175 mcg Oral QAC breakfast  . mouth rinse  15 mL Mouth Rinse q12n4p  . mirtazapine  30 mg Oral QHS  . montelukast  10 mg Oral QHS  . morphine  15 mg Oral QHS  . multivitamin  1 tablet Oral BID  . nicotine  21 mg Transdermal Daily  . pantoprazole  40 mg Oral Daily  . pregabalin  50 mg Oral QHS  . pyridostigmine  30 mg Oral  QID  . rivaroxaban  20 mg Oral Q supper  . sodium chloride flush  10-40 mL Intracatheter Q12H  . tiotropium  18 mcg Inhalation Daily  . venlafaxine  225 mg Oral Daily  . vitamin B-12  1,000 mcg Oral Daily    Objective: Vital signs in last 24 hours: Temp:  [97.6 F (36.4 C)-99.3 F (37.4 C)] 98.8 F (37.1 C) (09/13 0833) Pulse Rate:  [88-99] 97 (09/13 1128) Resp:  [20-36] 20 (09/13 1128) BP: (121-144)/(71-87) 132/71 (09/13 0833) SpO2:  [90 %-98 %] 97 % (09/13 1128) Weight:  [73.7 kg] 73.7 kg (09/13 0418) Physical Exam  Constitutional: He is oriented to person, place, and time. He appears well-developed and well-nourished. No distress.  HENT:  Mouth/Throat: Oropharynx is clear and moist. No oropharyngeal exudate.  Cardiovascular: Normal rate, regular rhythm and normal heart sounds. Exam reveals no gallop and no friction rub.  No murmur heard.  Pulmonary/Chest:tachypnic, acc. M. Use. Mild respiratory distress. Fine crackles at bases Abdominal: Soft. Bowel sounds are normal. He exhibits no distension. There is no tenderness.  Neurological: He is alert and oriented to person, place, and time.  Skin: Skin is warm but diaphoretic Psychiatric: He has a normal  mood and affect. His behavior is normal.     Lab Results Recent Labs    02/12/18 0500 02/13/18 0500  WBC 6.7 10.7*  HGB 9.6* 10.5*  HCT 30.2* 32.7*  NA 138 137  K 3.4* 4.1  CL 105 99  CO2 25 27  BUN 8 10  CREATININE 0.77 0.84    Microbiology: Blood cx negatve RVP negative - never had BAL or sputum sent for cx Studies/Results: Dg Chest Port 1 View  Result Date: 02/12/2018 CLINICAL DATA:  Hypoxia. EXAM: PORTABLE CHEST 1 VIEW COMPARISON:  Prior exams over the last week with radiographs. Chest CT 02/08/2018. No remote exams available. FINDINGS: Left central line with tip in the SVC. Diffuse interstitial/ground-glass opacities with progression in the right suprahilar lung. Unchanged heart size and mediastinal contours.  Again seen apical emphysema. No pneumothorax or pleural effusion. IMPRESSION: Diffuse interstitial/ground-glass opacities with progression in the right suprahilar lung. Differential considerations include infectious or inflammatory etiologies or pulmonary edema. Given history of Sjogren's syndrome, interstitial lung disease is also considered. No remote exams available for comparison. Electronically Signed   By: Keith Rake M.D.   On: 02/12/2018 21:19   Assessment/Plan: Respiratory distress in patient with hx of seronegative MG previously getting imuran.   Pulmonary status worsening- unclear if it is worsening due to cipro. Will stop cipro. He reports that he has been greater than 2 months since he may have mowed over animals. I suspect that tuleremia dx is low on the differential. The time frame should be within 3 wks of exposure if this is tuleremia.  Clinically looks abit like PJP. Will empirically start bactrim and have samples sent from BAL for DFA stain. His sputum stain on 9/10 was negative  I have a stronger suspicion for auto-immune process rather than infection at this time.  I would support trial of IVIG to see if it improves his respiratory status.  I spoken to neurology team, critical care, and hospitalist team in coordinating care.  [facility] for Infectious Diseases Cell: [phone] Pager: [phone]  02/13/2018, 12:09 PM

## 2018-02-13 NOTE — Procedures (Signed)
Central Venous Catheter Insertion Procedure Note Curtis Preston [id number] 02/13/1951  Procedure: Insertion of Central Venous Catheter Indications: Assessment of intravascular volume, Drug and/or fluid administration and Frequent blood sampling  Procedure Details Consent: Risks of procedure as well as the alternatives and risks of each were explained to the (patient/caregiver).  Consent for procedure obtained. Time Out: Verified patient identification, verified procedure, site/side was marked, verified correct patient position, special equipment/implants available, medications/allergies/relevent history reviewed, required imaging and test results available.  Performed  Maximum sterile technique was used including antiseptics, cap, gloves, gown, hand hygiene and mask. Sterile gloves Skin prep: Chlorhexidine; local anesthetic administered A antimicrobial bonded/coated triple lumen catheter was placed in the right subclavian vein using the Seldinger technique.  Evaluation Blood flow good Complications: No apparent complications Patient did tolerate procedure well. Chest X-ray ordered to verify placement.  CXR: pending.  Curtis Preston 02/13/2018, 2:25 PM

## 2018-02-13 NOTE — Procedures (Signed)
Intubation Procedure Note Curtis Preston [id number] Sep 25, 1950  Procedure: Intubation Indications: Respiratory insufficiency  Procedure Details Consent: Risks of procedure as well as the alternatives and risks of each were explained to the (patient/caregiver).  Consent for procedure obtained. Time Out: Verified patient identification, verified procedure, site/side was marked, verified correct patient position, special equipment/implants available, medications/allergies/relevent history reviewed, required imaging and test results available.  Performed  Maximum sterile technique was used including gloves, hand hygiene and mask.  MAC    Evaluation Hemodynamic Status: BP stable throughout; O2 sats: stable throughout Patient's Current Condition: stable Complications: No apparent complications Patient did tolerate procedure well. Chest X-ray ordered to verify placement.  CXR: pending.   Curtis Preston 02/13/2018

## 2018-02-13 NOTE — Progress Notes (Signed)
  [place] TEAM 1 - Stepdown/ICU TEAM  I was scheduled to assume primary care of this patient today, on transfer from the PCCM Service.  I was called to the bedside by his RN who reported hypoxia requiring 15L FM O2 support.  I arrived to find a pt alert and conversant, but tachypneic.  Sats were 94% but he displayed increased work of breathing.    I spoke w/ PCCM, who has agreed to resume care of the pt as he will require transfer back to the ICU and intubation.    Lonia BloodJeffrey T. Jrake Rodriquez, MD [facility] Office  [phone] Pager - Text Page per Amion as per below:  On-Call/Text Page:      [email or web address]      password [email or web address]  If 7PM-7AM, please contact night-coverage [email or web address] Password [email or web address] 02/13/2018, 2:42 PM

## 2018-02-13 NOTE — Progress Notes (Signed)
Curtis Preston  ZOX:[id number] DOB: 08/13/1950 DOA: 02/06/2018 PCP: No primary care provider on file.    LOS: 5 days   Reason for Consult / Chief Complaint:  Pneumonia in immunocompromised patient.   Consulting MD and date of consult:  Dr. Waymon AmatoHongalgi  HPI/summary of hospital stay:  67 year old male with PMH as below, which is significant for Myasthenia gravis on imuran, COPD, PE on Xarelto, and CAD. He was in his usual state of health (gets winded daily) until about early August of this year, when he developed worsening shortness of breath with associated productive cough (green sputum). This persisted for about 6 weeks, until about 9/6, when his symptoms significant worsened after traveling here to [place] from [place] ([place] lives[place] SOB worsened as did his productive cough, which caused him to present to ED. Workup in ED suggested pneumonia and he was started on empiric antibiotics. CT demonstrated bilateral ground glass, and most concerning for multi-focal pneumonia. Infectious disease consultation sees benefit in bronchoscopy for culture. PCCM consulted and recommended transfer to the intensive care unit for monitoring of respiratory status.  Subjective  Worsening respiratory status overnight and increased WOB this AM   Assessment & Plan:   Acute hypoxemic respiratory failure secondary to community acquired pneumonia in an immunosuppressed patient, possible atypical organism Acute hypoxemic respiratory failure - CT imaging with BL GGO and multiple intraparenchymal cyst/pneumatocele formation, LDH 311, would consider the diagnosis of PJP pneumonia  - LIP would be in the differential as well, BL GGO with cyst formation in a patient with Sjogrens syndrome  - Continue antimicrobial regimen per infectious disease - Transfer to the ICU - Intubate - Bronch with cultures, ID to assist with specifics on what they need from bronchoscopy - Hold anti-coagulation orally - Start heparin  per pharmacy post bronch - Full vent support - VAP preventsion - Titrate O2 for sat of 88-92% - High PEEP  Myasthenia Gravis, with possible exacerbation  - Management at this point per neurology - Agree that with history of PE, IVIG would increase his risk of recurrent VTE, will hold ofF IVIG at this point - Plasma exchange may be the best option but patient is stable from a MG stantpoint - He will need an HD cath and CCM can place this if needed, no need for now - D/C NIF and VC checks post intubation - The ciprofloxacin will likely continue to exacerbate his MG, will defer to ID  Hemoptysis - Stable, improved   COPD - Continue scheduled duonebs   GI:  - Insert OGT  - TF per nutrition  Cardiac:  - Heparin drip for a-fib  - Start PO anti-coagulation post intubation  Consultants: date of consult/date signed off (if applicable)/final recs  ID  Procedures:  Significant Diagnostic Tests: CT chest 9/9 > Extensive confluent ground-glass attenuation throughout both lungs with consolidation more prevalent in the superior segment of the left lower lobe. The findings are nonspecific, but most concerning for multi lobar pneumonia. Edema or possibly could have a similar appearance. This is to extensive for atelectasis. Consolidation in the superior segment in the left lower lobe suggests infection.  Micro Data: Blood 9/6 > Sputum 9/6 > RVP 9/7: Negative  Antimicrobials:  Azithromycin 9/6 > 9/9 Cefepime 9/6 > 9/9  Vancomycin 9/6>>>9/9 Ciprofloxacin 9/9>>>  Objective    Examination: General appearance: Adult male, in visible respiratory distress Eyes: anicteric sclerae, PERRL, EOM-I HENT: [place]/AT and MMM Neck: Trachea midline Lungs: Diffuse crackles CV:  RRR, Nl S1/S2 and -M/R/G Abdomen: Soft, NT, ND and +BS Extremities: -edema and -tenderness Skin: Intact Psych: Appropriate affect, normal mood  Neuro: Prior to intubation was alert and interactive, moving all ext to  command   Blood pressure (!) 145/86, pulse 96, temperature 97.6 F (36.4 C), temperature source Oral, resp. rate 20, height 6\' 1"  (1.854 m), weight 73.7 kg, SpO2 94 %.        Intake/Output Summary (Last 24 hours) at 02/13/2018 1300 Last data filed at 02/13/2018 1248 Gross per 24 hour  Intake 449.68 ml  Output 3650 ml  Net -3200.32 ml   Filed Weights   02/10/18 0500 02/11/18 0530 02/13/18 0418  Weight: 76.4 kg 76.3 kg 73.7 kg   Labs    CBC: Recent Labs  Lab 02/06/18 2028 02/08/18 0340 02/10/18 0359 02/11/18 1804 02/12/18 0500 02/13/18 0500  WBC 13.3* 11.8* 9.2 7.6 6.7 10.7*  NEUTROABS 11.6*  --  7.3  --   --   --   HGB 13.8 11.5* 11.4* 10.0* 9.6* 10.5*  HCT 41.4 34.3* 33.8* 30.8* 30.2* 32.7*  MCV 96.3 97.2 97.7 98.7 100.3* 98.8  PLT 188 183 226 296 343 399   Basic Metabolic Panel: Recent Labs  Lab 02/06/18 2028 02/10/18 0359 02/12/18 0500 02/13/18 0500  NA 138 138 138 137  K 4.3 4.1 3.4* 4.1  CL 105 102 105 99  CO2 21* 26 25 27   GLUCOSE 105* 112* 89 102*  BUN 15 7* 8 10  CREATININE 1.17 0.89 0.77 0.84  CALCIUM 8.5* 8.1* 7.5* 8.1*  MG  --   --  2.0 2.1  PHOS  --   --  3.1 4.1   GFR: Estimated Creatinine Clearance: 86.5 mL/min (by C-G formula based on SCr of 0.84 mg/dL). Recent Labs  Lab 02/07/18 0034 02/07/18 0657  02/10/18 0359 02/11/18 1804 02/12/18 0500 02/13/18 0500  PROCALCITON 0.16  --   --   --   --   --   --   WBC  --   --    < > 9.2 7.6 6.7 10.7*  LATICACIDVEN 1.4 1.9  --   --   --   --   --    < > = values in this interval not displayed.   Liver Function Tests: Recent Labs  Lab 02/10/18 0359  AST 36  ALT 29  ALKPHOS 110  BILITOT 0.8  PROT 5.4*  ALBUMIN 2.0*   No results for input(s): LIPASE, AMYLASE in the last 168 hours. No results for input(s): AMMONIA in the last 168 hours. ABG    Component Value Date/Time   PHART 7.467 (H) 02/12/2018 2130   PCO2ART 35.6 02/12/2018 2130   PO2ART 60.5 (L) 02/12/2018 2130   HCO3 25.4  02/12/2018 2130   O2SAT 91.9 02/12/2018 2130    Coagulation Profile: No results for input(s): INR, PROTIME in the last 168 hours. Cardiac Enzymes: No results for input(s): CKTOTAL, CKMB, CKMBINDEX, TROPONINI in the last 168 hours. HbA1C: No results found for: HGBA1C CBG: No results for input(s): GLUCAP in the last 168 hours.  CXR: reviewed - BL infiltrates present   CT Chest: BL GGO with intraparenchymal cyst formation   Discussed with PCCM-NP and TRH-MD.  The patient is critically ill with multiple organ systems failure and requires high complexity decision making for assessment and support, frequent evaluation and titration of therapies, application of advanced monitoring technologies and extensive interpretation of multiple databases.   Critical Care Time devoted to patient care  services described in this note is  45  Minutes. This time reflects time of care of this signee Dr Koren Bound. This critical care time does not reflect procedure time, or teaching time or supervisory time of PA/NP/Med student/Med Resident etc but could involve care discussion time.  Alyson Reedy, M.D. [facility] Pulmonary/Critical Care Medicine. Pager: [phone]. After hours pager: [phone].

## 2018-02-13 NOTE — Procedures (Signed)
Bronchoscopy Procedure Note Curtis Preston [id number] [birth year]  Procedure: Bronchoscopy Indications: Obtain specimens for culture and/or other diagnostic studies  Procedure Details Consent: Risks of procedure as well as the alternatives and risks of each were explained to the (patient/caregiver).  Consent for procedure obtained. Time Out: Verified patient identification, verified procedure, site/side was marked, verified correct patient position, special equipment/implants available, medications/allergies/relevent history reviewed, required imaging and test results available.  Performed  In preparation for procedure, patient was given 100% FiO2 and bronchoscope lubricated. Sedation: Benzodiazepines, Muscle relaxants, Etomidate and Fentanyl  Airway entered and the following bronchi were examined: RUL, RML, RLL, LUL, LLL and Bronchi.   BAL from the left lung Bronchoscope removed.  , Patient placed back on 100% FiO2 at conclusion of procedure.    Evaluation Hemodynamic Status: BP stable throughout; O2 sats: stable throughout Patient's Current Condition: stable Specimens:  Sent purulent fluid Complications: No apparent complications Patient did tolerate procedure well.   Curtis Preston 02/13/2018

## 2018-02-13 NOTE — Discharge Instructions (Addendum)
Follow with Primary MD in 5 days you must follow with either the local recommended pulmonologist or your pulmonologist in [place] within 2 weeks.  Get CBC, CMP, 2 view Chest X ray checked  by Primary MD  5 days   Activity: As tolerated with Full fall precautions use walker/cane & assistance as needed  Disposition Home   Diet: Heart Healthy    For Heart failure patients - Check your Weight same time everyday, if you gain over 2 pounds, or you develop in leg swelling, experience more shortness of breath or chest pain, call your Primary MD immediately. Follow Cardiac Low Salt Diet and 1.5 lit/day fluid restriction.  Special Instructions: If you have smoked or chewed Tobacco  in the last 2 yrs please stop smoking, stop any regular Alcohol  and or any Recreational drug use.  On your next visit with your primary care physician please Get Medicines reviewed and adjusted.  Please request your Prim.MD to go over all Hospital Tests and Procedure/Radiological results at the follow up, please get all Hospital records sent to your Prim MD by signing hospital release before you go home.  If you experience worsening of your admission symptoms, develop shortness of breath, life threatening emergency, suicidal or homicidal thoughts you must seek medical attention immediately by calling 911 or calling your MD immediately  if symptoms less severe.  You Must read complete instructions/literature along with all the possible adverse reactions/side effects for all the Medicines you take and that have been prescribed to you. Take any new Medicines after you have completely understood and accpet all the possible adverse reactions/side effects.   Do not drive, operate heavy machinery, perform activities at heights, swimming or participation in water activities or provide baby sitting services if your were admitted for syncope or siezures until you have seen by Primary MD or a Neurologist and advised to do so again.  Do  not drive when taking Pain medications.    Do not take more than prescribed Pain, Sleep and Anxiety Medications  Wear Seat belts while driving.   Please note  You were cared for by a hospitalist during your hospital stay. If you have any questions about your discharge medications or the care you received while you were in the hospital after you are discharged, you can call the unit and asked to speak with the hospitalist on call if the hospitalist that took care of you is not available. Once you are discharged, your primary care physician will handle any further medical issues. Please note that NO REFILLS for any discharge medications will be authorized once you are discharged, as it is imperative that you return to your primary care physician (or establish a relationship with a primary care physician if you do not have one) for your aftercare needs so that they can reassess your need for medications and monitor your lab values.   Information on my medicine - XARELTO (rivaroxaban)  WHY WAS XARELTO PRESCRIBED FOR YOU? Xarelto was prescribed to treat blood clots that may have been found in the veins of your legs (deep vein thrombosis) or in your lungs (pulmonary embolism) and to reduce the risk of them occurring again.  What do you need to know about Xarelto? The dose is one 20 mg tablet taken ONCE A DAY with your evening meal.  DO NOT stop taking Xarelto without talking to the health care provider who prescribed the medication.  Refill your prescription for 20 mg tablets before you run out.  After discharge, you should have regular check-up appointments with your healthcare provider that is prescribing your Xarelto.  In the future your dose may need to be changed if your kidney function changes by a significant amount.  What do you do if you miss a dose? If you are taking Xarelto ONCE DAILY and you miss a dose, take it as soon as you remember on the same day then continue your  regularly scheduled once daily regimen the next day. Do not take two doses of Xarelto at the same time.   Important Safety Information Xarelto is a blood thinner medicine that can cause bleeding. You should call your healthcare provider right away if you experience any of the following: ? Bleeding from an injury or your nose that does not stop. ? Unusual colored urine (red or dark brown) or unusual colored stools (red or black). ? Unusual bruising for unknown reasons. ? A serious fall or if you hit your head (even if there is no bleeding).  Some medicines may interact with Xarelto and might increase your risk of bleeding while on Xarelto. To help avoid this, consult your healthcare provider or pharmacist prior to using any new prescription or non-prescription medications, including herbals, vitamins, non-steroidal anti-inflammatory drugs (NSAIDs) and supplements.  This website has more information on Xarelto: [email or web address].[email or web address].

## 2018-02-14 ENCOUNTER — Inpatient Hospital Stay (HOSPITAL_COMMUNITY): Payer: Medicare HMO

## 2018-02-14 DIAGNOSIS — E440 Moderate protein-calorie malnutrition: Secondary | ICD-10-CM

## 2018-02-14 DIAGNOSIS — Z9911 Dependence on respirator [ventilator] status: Secondary | ICD-10-CM

## 2018-02-14 LAB — BASIC METABOLIC PANEL
Anion gap: 11 (ref 5–15)
BUN: 20 mg/dL (ref 8–23)
CO2: 25 mmol/L (ref 22–32)
Calcium: 7.6 mg/dL — ABNORMAL LOW (ref 8.9–10.3)
Chloride: 99 mmol/L (ref 98–111)
Creatinine, Ser: 0.88 mg/dL (ref 0.61–1.24)
GFR calc Af Amer: >60 mL/min (ref >60)
GLUCOSE: 93 mg/dL (ref 70–99)
Potassium: 4.2 mmol/L (ref 3.5–5.1)
SODIUM: 135 mmol/L (ref 135–145)

## 2018-02-14 LAB — BLOOD GAS, ARTERIAL
ACID-BASE EXCESS: 0.5 mmol/L (ref 0.0–2.0)
BICARBONATE: 25.2 mmol/L (ref 20.0–28.0)
Drawn by: 517021
FIO2: 50
LHR: 20 {breaths}/min
O2 SAT: 95.9 %
PEEP/CPAP: 8 cmH2O
Patient temperature: 98.6
VT: 640 mL
pCO2 arterial: 44.9 mmHg (ref 32.0–48.0)
pH, Arterial: 7.367 (ref 7.350–7.450)
pO2, Arterial: 83.1 mmHg (ref 83.0–108.0)

## 2018-02-14 LAB — GLUCOSE, CAPILLARY
GLUCOSE-CAPILLARY: 101 mg/dL — AB (ref 70–99)
GLUCOSE-CAPILLARY: 119 mg/dL — AB (ref 70–99)
Glucose-Capillary: 139 mg/dL — ABNORMAL HIGH (ref 70–99)

## 2018-02-14 LAB — MAGNESIUM
MAGNESIUM: 2 mg/dL (ref 1.7–2.4)
Magnesium: 2.2 mg/dL (ref 1.7–2.4)

## 2018-02-14 LAB — CBC
HCT: 29.3 % — ABNORMAL LOW (ref 39.0–52.0)
Hemoglobin: 9.5 g/dL — ABNORMAL LOW (ref 13.0–17.0)
MCH: 32.4 pg (ref 26.0–34.0)
MCHC: 32.4 g/dL (ref 30.0–36.0)
MCV: 100 fL (ref 78.0–100.0)
PLATELETS: 370 10*3/uL (ref 150–400)
RBC: 2.93 MIL/uL — ABNORMAL LOW (ref 4.22–5.81)
RDW: 13.5 % (ref 11.5–15.5)
WBC: 10.2 10*3/uL (ref 4.0–10.5)

## 2018-02-14 LAB — PHOSPHORUS
PHOSPHORUS: 3.7 mg/dL (ref 2.5–4.6)
Phosphorus: 3.9 mg/dL (ref 2.5–4.6)

## 2018-02-14 MED ORDER — CHLORHEXIDINE GLUCONATE CLOTH 2 % EX PADS
6.0000 | MEDICATED_PAD | Freq: Every day | CUTANEOUS | Status: DC
  Administered 2018-02-14 – 2018-03-04 (×17): 6 via TOPICAL

## 2018-02-14 MED ORDER — SODIUM CHLORIDE 0.9 % IV SOLN
750.0000 mg | Freq: Three times a day (TID) | INTRAVENOUS | Status: AC
  Administered 2018-02-14 – 2018-02-17 (×11): 750 mg via INTRAVENOUS
  Filled 2018-02-14 (×6): qty 500
  Filled 2018-02-14: qty 250
  Filled 2018-02-14: qty 750
  Filled 2018-02-14: qty 500
  Filled 2018-02-14: qty 250
  Filled 2018-02-14: qty 500
  Filled 2018-02-14: qty 750

## 2018-02-14 MED ORDER — MIDAZOLAM HCL 2 MG/2ML IJ SOLN
2.0000 mg | INTRAMUSCULAR | Status: DC | PRN
  Administered 2018-02-14 – 2018-02-15 (×3): 2 mg via INTRAVENOUS
  Filled 2018-02-14 (×3): qty 2

## 2018-02-14 NOTE — Progress Notes (Signed)
Subjective:  On the ventilator but is communicating and seems to want to be extubated.   Antibiotics:  Anti-infectives (From admission, onward)   Start     Dose/Rate Route Frequency Ordered Stop   02/14/18 1400  imipenem-cilastatin (PRIMAXIN) 750 mg in sodium chloride 0.9 % 100 mL IVPB     750 mg 200 mL/hr over 30 Minutes Intravenous Every 8 hours 02/14/18 1031     02/13/18 1330  sulfamethoxazole-trimethoprim (BACTRIM) 370 mg in dextrose 5 % 500 mL IVPB     370 mg 348.8 mL/hr over 90 Minutes Intravenous Every 8 hours 02/13/18 1238     02/09/18 1600  ciprofloxacin (CIPRO) IVPB 400 mg  Status:  Discontinued     400 mg 200 mL/hr over 60 Minutes Intravenous Every 12 hours 02/09/18 1516 02/13/18 1140   02/09/18 1200  ciprofloxacin (CIPRO) tablet 500 mg  Status:  Discontinued     500 mg Oral 2 times daily 02/09/18 1159 02/09/18 1211   02/08/18 0100  vancomycin (VANCOCIN) IVPB 1000 mg/200 mL premix  Status:  Discontinued     1,000 mg 200 mL/hr over 60 Minutes Intravenous Every 24 hours 02/07/18 0107 02/09/18 1159   02/08/18 0000  azithromycin (ZITHROMAX) 250 mg in dextrose 5 % 125 mL IVPB  Status:  Discontinued     250 mg 125 mL/hr over 60 Minutes Intravenous Every 24 hours 02/07/18 0032 02/09/18 1159   02/07/18 0600  ceFEPIme (MAXIPIME) 1 g in sodium chloride 0.9 % 100 mL IVPB  Status:  Discontinued     1 g 200 mL/hr over 30 Minutes Intravenous Every 8 hours 02/07/18 0032 02/09/18 1159   02/07/18 0045  ceFEPIme (MAXIPIME) 1 g in sodium chloride 0.9 % 100 mL IVPB  Status:  Discontinued     1 g 200 mL/hr over 30 Minutes Intravenous Every 8 hours 02/07/18 0031 02/07/18 0032   02/07/18 0045  vancomycin (VANCOCIN) 1,500 mg in sodium chloride 0.9 % 500 mL IVPB     1,500 mg 250 mL/hr over 120 Minutes Intravenous  Once 02/07/18 0042 02/07/18 0559   02/06/18 2330  cefTRIAXone (ROCEPHIN) 1 g in sodium chloride 0.9 % 100 mL IVPB     1 g 200 mL/hr over 30 Minutes Intravenous  Once  02/06/18 2326 02/07/18 0048   02/06/18 2330  azithromycin (ZITHROMAX) 500 mg in sodium chloride 0.9 % 250 mL IVPB     500 mg 250 mL/hr over 60 Minutes Intravenous  Once 02/06/18 2326 02/07/18 0201      Medications: Scheduled Meds: . atorvastatin  40 mg Per Tube QHS  . baclofen  10 mg Per Tube TID  . calcium-vitamin D  1 tablet Per Tube Daily  . chlorhexidine gluconate (MEDLINE KIT)  15 mL Mouth Rinse BID  . chlorhexidine gluconate (MEDLINE KIT)  15 mL Mouth Rinse BID  . Chlorhexidine Gluconate Cloth  6 each Topical Daily  . feeding supplement (PRO-STAT SUGAR FREE 64)  30 mL Per Tube Daily  . Influenza vac split quadrivalent PF  0.5 mL Intramuscular Tomorrow-1000  . ipratropium-albuterol  3 mL Nebulization Q6H  . levothyroxine  175 mcg Per Tube QAC breakfast  . mouth rinse  15 mL Mouth Rinse 10 times per day  . montelukast  10 mg Oral QHS  . pregabalin  50 mg Per Tube QHS  . pyridostigmine  30 mg Per Tube QID  . rivaroxaban  20 mg Per Tube Q supper  . sodium chloride flush  10-40 mL Intracatheter Q12H  . vitamin B-12  1,000 mcg Per Tube Daily   Continuous Infusions: . sodium chloride 10 mL/hr at 02/14/18 1000  . dexmedetomidine (PRECEDEX) IV infusion 1 mcg/kg/hr (02/14/18 1000)  . famotidine (PEPCID) IV 20 mg (02/14/18 1048)  . feeding supplement (VITAL AF 1.2 CAL) 1,500 mL (02/13/18 1750)  . fentaNYL infusion INTRAVENOUS 100 mcg/hr (02/14/18 1000)  . imipenem-cilastatin    . norepinephrine (LEVOPHED) Adult infusion Stopped (02/14/18 0554)  . sulfamethoxazole-trimethoprim Stopped (02/14/18 0825)   PRN Meds:.sodium chloride, docusate, fentaNYL, fentaNYL (SUBLIMAZE) injection, fentaNYL (SUBLIMAZE) injection, sodium chloride flush    Objective: Weight change: 3.6 kg  Intake/Output Summary (Last 24 hours) at 02/14/2018 1050 Last data filed at 02/14/2018 1000 Gross per 24 hour  Intake 3717.24 ml  Output 1085 ml  Net 2632.24 ml   Blood pressure (!) 87/52, pulse 73,  temperature 99 F (37.2 C), temperature source Oral, resp. rate 20, height _0  (1.854 m), weight 77.3 kg, SpO2 90 %. Temp:  [97.6 F (36.4 C)-99.4 F (37.4 C)] 99 F (37.2 C) (09/14 0700) Pulse Rate:  [69-120] 73 (09/14 1030) Resp:  [11-36] 20 (09/14 1030) BP: (66-206)/(50-121) 87/52 (09/14 1030) SpO2:  [85 %-100 %] 90 % (09/14 1030) FiO2 (%):  [40 %-100 %] 40 % (09/14 0800) Weight:  [77.3 kg] 77.3 kg (09/14 0400)  Physical Exam: General: Alert and awake, oriented x3, and later answering questions by nodding his head trying to talk.  Following commands. HEENT: anicteric sclera, EOMI CVS regular rate, normal  Chest: , no wheezing, few rhonchi Abdomen: soft non-distended,  Extremities: no edema or deformity noted bilaterally Skin: no rashes Neuro: nonfocal  CBC:    BMET Recent Labs    02/13/18 0500 02/14/18 0335  NA 137 135  K 4.1 4.2  CL 99 99  CO2 27 25  GLUCOSE 102* 93  BUN 10 20  CREATININE 0.84 0.88  CALCIUM 8.1* 7.6*     Liver Panel  No results for input(s): PROT, ALBUMIN, AST, ALT, ALKPHOS, BILITOT, BILIDIR, IBILI in the last 72 hours.     Sedimentation Rate No results for input(s): ESRSEDRATE in the last 72 hours. C-Reactive Protein No results for input(s): CRP in the last 72 hours.  Micro Results: Recent Results (from the past 720 hour(s))  Blood culture (routine x 2)     Status: None (Preliminary result)   Collection Time: 02/06/18 11:35 PM  Result Value Ref Range Status   Specimen Description BLOOD RIGHT ANTECUBITAL  Final   Special Requests   Final    BOTTLES DRAWN AEROBIC AND ANAEROBIC Blood Culture adequate volume   Culture   Final    NO GROWTH 7 DAYS HOLDING FOR TEN DAYS Performed at [facility], [address] [place], [place] [address]    Report Status PENDING  Incomplete  Blood culture (routine x 2)     Status: None (Preliminary result)   Collection Time: 02/06/18 11:45 PM  Result Value Ref Range Status   Specimen  Description BLOOD LEFT HAND  Final   Special Requests   Final    BOTTLES DRAWN AEROBIC AND ANAEROBIC Blood Culture adequate volume   Culture   Final    NO GROWTH 7 DAYS HOLDING FOR 10 DAYS Performed at [facility], [place] [address], [place], [place] [address]    Report Status PENDING  Incomplete  Respiratory Panel by PCR     Status: None   Collection Time: 02/07/18  8:24  AM  Result Value Ref Range Status   Adenovirus NOT DETECTED NOT DETECTED Final   Coronavirus 229E NOT DETECTED NOT DETECTED Final   Coronavirus HKU1 NOT DETECTED NOT DETECTED Final   Coronavirus NL63 NOT DETECTED NOT DETECTED Final   Coronavirus OC43 NOT DETECTED NOT DETECTED Final   Metapneumovirus NOT DETECTED NOT DETECTED Final   Rhinovirus / Enterovirus NOT DETECTED NOT DETECTED Final   Influenza A NOT DETECTED NOT DETECTED Final   Influenza B NOT DETECTED NOT DETECTED Final   Parainfluenza Virus 1 NOT DETECTED NOT DETECTED Final   Parainfluenza Virus 2 NOT DETECTED NOT DETECTED Final   Parainfluenza Virus 3 NOT DETECTED NOT DETECTED Final   Parainfluenza Virus 4 NOT DETECTED NOT DETECTED Final   Respiratory Syncytial Virus NOT DETECTED NOT DETECTED Final   Bordetella pertussis NOT DETECTED NOT DETECTED Final   Chlamydophila pneumoniae NOT DETECTED NOT DETECTED Final   Mycoplasma pneumoniae NOT DETECTED NOT DETECTED Final    Comment: Performed at [facility], [place] [address] [place], [place] [address]  MRSA PCR Screening     Status: None   Collection Time: 02/09/18  2:56 PM  Result Value Ref Range Status   MRSA by PCR NEGATIVE NEGATIVE Final    Comment:        The GeneXpert MRSA Assay (FDA approved for NASAL specimens only), is one component of a comprehensive MRSA colonization surveillance program. It is not intended to diagnose MRSA infection nor to guide or monitor treatment for MRSA infections. Performed at [facility], [place] [address], [place], [place] [address]   Pneumocystis  smear by DFA     Status: None   Collection Time: 02/10/18  7:32 PM  Result Value Ref Range Status   Specimen Source-PJSRC SPUTUM  Final   Pneumocystis jiroveci Ag NEGATIVE  Final    Comment: Performed at [facility] Performed at [facility], [address], [place], [place] [address]   Culture, fungus without smear     Status: None (Preliminary result)   Collection Time: 02/13/18  2:02 PM  Result Value Ref Range Status   Specimen Description BRONCHIAL ALVEOLAR LAVAGE RIGHT  Final   Special Requests NONE  Final   Culture   Final    NO FUNGUS ISOLATED AFTER 1 DAY Performed at [facility], [place] [address], [place], [place] [address]    Report Status PENDING  Incomplete  Culture, bal-quantitative     Status: None (Preliminary result)   Collection Time: 02/13/18  2:02 PM  Result Value Ref Range Status   Specimen Description BRONCHIAL ALVEOLAR LAVAGE RIGHT  Final   Special Requests Immunocompromised  Final   Gram Stain   Final    FEW WBC PRESENT,BOTH PMN AND MONONUCLEAR NO ORGANISMS SEEN    Culture   Final    NO GROWTH < 24 HOURS Performed at [facility], [place] [address], [place],  [address]    Report Status PENDING  Incomplete  Culture, bal-quantitative     Status: None (Preliminary result)   Collection Time: 02/13/18  2:02 PM  Result Value Ref Range Status   Specimen Description BRONCHIAL ALVEOLAR LAVAGE LEFT  Final   Special Requests Immunocompromised  Final   Gram Stain   Final    FEW WBC PRESENT, PREDOMINANTLY PMN NO ORGANISMS SEEN    Culture   Final    NO GROWTH < 24 HOURS Performed at [facility], [place]  [address], [place], [place] [address]    Report Status PENDING  Incomplete  Culture, fungus without smear     Status: None (Preliminary result)   Collection Time: 02/13/18  2:02 PM  Result Value Ref Range Status   Specimen Description BRONCHIAL ALVEOLAR LAVAGE LEFT  Final   Special Requests NONE  Final   Culture   Final    NO  FUNGUS ISOLATED AFTER 1 DAY Performed at [facility], [place] [address], [place], [place] [address]    Report Status PENDING  Incomplete    Studies/Results: Dg Chest Port 1 View  Result Date: 02/14/2018 CLINICAL DATA:  History of endotracheal tube EXAM: PORTABLE CHEST 1 VIEW COMPARISON:  Yesterday FINDINGS: Endotracheal tube tip between the clavicular heads and carina. An orogastric tube and side-port reaches the stomach. Gas is seen within the stomach although decreased from prior. Bilateral subclavian line with tip at the SVC. Diffuse interstitial and airspace opacity, stable. Normal heart size and stable mediastinal contours. No effusion or pneumothorax. IMPRESSION: 1. Unremarkable hardware positioning. 2. Persistent airspace disease in the setting of immunocompromise status post recent BAL. Electronically Signed   By: Monte Fantasia M.D.   On: 02/14/2018 08:12   Dg Chest Port 1 View  Result Date: 02/13/2018 CLINICAL DATA:  History of ETT EXAM: PORTABLE CHEST 1 VIEW COMPARISON:  02/13/2018, 02/12/2018, 02/10/2018, CT chest 02/08/2017 FINDINGS: Endotracheal tube tip is about 4.5 cm superior to the carina. Esophageal tube tip is below the diaphragm but non included. Bilateral central venous catheter tips over the SVC. Stable cardiomediastinal silhouette. Interval worsening of interstitial and ground-glass opacity within the bilateral lungs. No pneumothorax. IMPRESSION: 1. Endotracheal tube tip about 4.5 cm superior to the carina. 2. Slight worsening of diffuse bilateral interstitial and ground-glass edema or infiltrates. Electronically Signed   By: Donavan Foil M.D.   On: 02/13/2018 23:39   Dg Chest Port 1 View  Result Date: 02/13/2018 CLINICAL DATA:  Endotracheal tube placement. EXAM: PORTABLE CHEST 1 VIEW COMPARISON:  02/12/2018 FINDINGS: Endotracheal tube has tip 7.3 cm above the carina. Left subclavian central venous catheter is unchanged. Interval placement of right subclavian central  venous catheter with tip over the SVC. Lungs are adequately inflated demonstrate continued bilateral hazy interstitial process right worse than left with slight interval improved aeration. No effusion. No pneumothorax. Cardiomediastinal silhouette and remainder the exam is unchanged. IMPRESSION: Persistent bilateral hazy interstitial process with slight improved aeration. Tubes and lines as described.  No pneumothorax. Electronically Signed   By: Marin Olp M.D.   On: 02/13/2018 15:13   Dg Chest Port 1 View  Result Date: 02/12/2018 CLINICAL DATA:  Hypoxia. EXAM: PORTABLE CHEST 1 VIEW COMPARISON:  Prior exams over the last week with radiographs. Chest CT 02/08/2018. No remote exams available. FINDINGS: Left central line with tip in the SVC. Diffuse interstitial/ground-glass opacities with progression in the right suprahilar lung. Unchanged heart size and mediastinal contours. Again seen apical emphysema. No pneumothorax or pleural effusion. IMPRESSION: Diffuse interstitial/ground-glass opacities with progression in the right suprahilar lung. Differential considerations include infectious or inflammatory etiologies or pulmonary edema. Given history of Sjogren's syndrome, interstitial lung disease is also considered. No remote exams available for comparison. Electronically Signed   By: Keith Rake M.D.   On: 02/12/2018 21:19      Assessment/Plan:  INTERVAL HISTORY: She was intubated yesterday and had bronchoscopy with BAL.   Principal Problem:   Sepsis ([place]) Active Problems:   CAD (coronary artery disease)   COPD (chronic  obstructive pulmonary disease) (HCC)   Depression with anxiety   GERD (gastroesophageal reflux disease)   Hypothyroidism   Myasthenia gravis ([place])   PE (pulmonary thromboembolism) (HCC)   Tobacco abuse   Multifocal pneumonia   Respiratory failure (HCC)   Acute respiratory failure with hypoxemia (HCC)   ARDS (adult respiratory distress syndrome) ([place])   Malnutrition  of moderate degree    Curtis Preston is a 67 y.o. male with history of myasthenia gravis who presented with pneumonia and a several month history of weight loss.  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  We had placed him on ciprofloxacin but his clinical status had deteriorated.  Dr. Baxter Flattery stopped the ciprofloxacin due to her concerns that it was exacerbating myasthenia gravis.  His pulmonary status did indeed worsen yesterday he required endotracheal intubation and underwent bronc [place] with BAL.  If I understand the bronchoscopy report there was purulent material in the fluid in the lungs.  A noninfectious etiology is also in the differential.  He has been placed on treatment for PCP pneumonia though PCP DFA on a sputum is negative PCP on BAL is pending as are cultures from the BAL.   I have added imipenem to cover for nocardia and (which would also be covered by the Bactrim)  Follow-up cultures and serologies.  The fact that he worsened on ciprofloxacin I think would also argue against Francisella tularemia.  I have seen patient who presented with a smoldering non-resolving pneumonia at [facility] that did have tularemia as well and certainly is atypical presentation of this fairly virulent organism but still I felt it was worth treating given the clinical clues.  I am also sending AFB stains and cultures mainly with nontuberculous mycobacteria in mind.  That being said he is a [place] vet and has been abroad and has had 30 pounds of weight loss.  Therefore we should place him in airborne isolation while we await the stains from the non-bronc BAL culture and stain    LOS: 6 days   Curtis Preston 02/14/2018, 10:50 AM

## 2018-02-14 NOTE — Progress Notes (Signed)
Pt gagging and coughing on vent. ETT at 21cm. RT called to bedside. RT advanced ETT to 24cm. As it was previously. Chest Xray to confirm placement was completed. Deterding MD called and notified. ETT in correct placement, pt saturating well on vent.

## 2018-02-14 NOTE — Progress Notes (Signed)
Patient spouse, Debby, called this morning for update.Spouse is in [place]. Patient password set up and in chart.

## 2018-02-14 NOTE — Progress Notes (Signed)
Transport patient to new room on vent. No noted respiratory issues at this time.

## 2018-02-14 NOTE — Progress Notes (Signed)
Pharmacy Antibiotic Note  Omarion Minnehan is a 67 y.o. male admitted on 02/06/2018 with pneumonia.  Pharmacy has been consulted for Imipenem/cilastin dosing.  ID with suspicion for Nocardia - pharmacy asked to cover with DAW Primaxin (no formulary substitution to Meropenem).  Plan: Imipenem/cilastin 750 mg IV q 8hrs.  Will use higher dosing for now. F/u LOT.  Height: _0  (185.4 cm) Weight: 170 lb 6.7 oz (77.3 kg) IBW/kg (Calculated) : 79.9  Temp (24hrs), Avg:98.5 F (36.9 C), Min:97.6 F (36.4 C), Max:99.4 F (37.4 C)  Recent Labs  Lab 02/10/18 0359 02/11/18 1804 02/12/18 0500 02/13/18 0500 02/14/18 0335  WBC 9.2 7.6 6.7 10.7* 10.2  CREATININE 0.89  --  0.77 0.84 0.88    Estimated Creatinine Clearance: 86.6 mL/min (by C-G formula based on SCr of 0.88 mg/dL).    Allergies  Allergen Reactions  . Hydromorphone Other (See Comments)    hallucinations   . Immune Globulin Other (See Comments)     Bilateral pulmonary embolism      Antimicrobials this admission: Vanc 9/7 >> 9/9 cetriaxone 9/7 x 1 Azithromycin 9/7>> 9/9 Cefepime 9/7>> 9/9 Cipro 9/9 >> 9/13 Bactrim 9/13 > Primaxin 9/14 >   Dose adjustments this admission:  Microbiology results: Histo -negative PCP smear: (-)  BCx 9/13: NGTD Bcx 9/6: NGTD (holding for 10 days) 9/13 BAL - pending Respiratory Panel PCR 9/7: negative   Thank you for allowing pharmacy to be a part of this patient's care.  Marguerite Olea, [facility] Clinical Pharmacist Phone [phone]  02/14/2018 10:41 AM

## 2018-02-14 NOTE — Progress Notes (Signed)
PULMONARY / CRITICAL CARE MEDICINE   NAME:  Curtis Preston, MRN:  [id number], DOB:  03-Jun-1951, LOS: 6 ADMISSION DATE:  02/06/2018, CONSULTATION DATE:  02/09/2018 REFERRING MD:  Algis Liming, CHIEF COMPLAINT:  PNA in an immunocompromised patient  BRIEF HISTORY:    67 year old male with PMH as below, which is significant for Myasthenia gravis on imuran, COPD, PE on Xarelto, and CAD. He was in his usual state of health (gets winded daily) until about early August of this year, when he developed worsening shortness of breath with associated productive cough (green sputum). This persisted for about 6 weeks, until about 9/6, when his symptoms significant worsened after traveling here to [place] from [place] (where he lives). SOB worsened as did his productive cough, which caused him to present to ED. Workup in ED suggested pneumonia and he was started on empiric antibiotics. CT demonstrated bilateral ground glass, and most concerning for multi-focal pneumonia. Infectious disease consultation sees benefit in bronchoscopy for culture. PCCM consulted and recommended transfer to the intensive care unit for monitoring of respiratory status.  SIGNIFICANT EVENTS:  During planned tx to IM yesterday, the patient was more hypoxemic and tachypneic; ergo, transfer was postponed. The patient was re-intubated and had a TLC placed int he R subclavian.  STUDIES:   FOB 9/13 with specimens sent for PCP  CULTURES:  BAL sent for quant cultures and fungi  ANTIBIOTICS:  Bactrim  LINES/TUBES:   ETT 9/13> R subclav TLC 9/13>   SUBJECTIVE:  Indicating no specific c/o when aroused  CONSTITUTIONAL: BP 111/60   Pulse 86   Temp 99 F (37.2 C) (Oral)   Resp 20   Ht _0  (1.854 m)   Wt 77.3 kg   SpO2 90%   BMI 22.48 kg/m   I/O last 3 completed shifts: In: 2891.5 [I.V.:839.3; NG/GT:790; IV Piggyback:1262.2] Out: 3865 [Urine:3865]     Vent Mode: PRVC FiO2 (%):  [40 %-100 %] 40 % Set Rate:  [20 bmp] 20  bmp Vt Set:  [640 mL] 640 mL PEEP:  [8 cmH20-14 cmH20] 8 cmH20 Plateau Pressure:  [9 POE42-35 cmH20] 20 cmH20  PHYSICAL EXAM: General:  WD/Wn NAD Neuro:  No focal findings HEENT:  [place]/AT; PRRL, EOMI; ETT/OGT in place Cardiovascular:  RRR, no m/r/g Lungs:  Clear ant and laterally Abdomen:  Supple, no guarding, masses Musculoskeletal:  No active joints Skin:  No C/C/E     ASSESSMENT AND PLAN   1) Hx ILD with no definitive dx. Was being evaluated by physicians in patient's home state. Does not appear to be acutely decompensated. F/u as OPT 2) Possible atypical pneumonia. Pt underwent FOB with BAL yesterday with specimens pending. On Bactrim for presumptive PJP 3) Hypoxemic respiratory failure. MV on PRVC ~ 7lpm. Will see if he can tolerate PSV for most of the day. 4) Myasthenia Gravis. Neurol note appreciated; not in myasthenic crisis and does not need plasma exchange; Imuran off. 5) Hx recurrent VTE and PE. Continue heparin   SUMMARY OF TODAY'S PLAN: As above    Best Practice / Goals of Care / Disposition.   DVT PROPHYLAXIS: heparin drip NUTRITION:Pro Stat TF MOBILITY:BR GOALS OF CARE:Early extubation   LABS  Glucose Recent Labs  Lab 02/13/18 1625 02/13/18 1947 02/14/18 0729  GLUCAP 112* 147* 139*    BMET Recent Labs  Lab 02/12/18 0500 02/13/18 0500 02/14/18 0335  NA 138 137 135  K 3.4* 4.1 4.2  CL 105 99 99  CO2 _1 BUN  _0 CREATININE 0.77 0.84 0.88  GLUCOSE 89 102* 93    Liver Enzymes Recent Labs  Lab 02/10/18 0359  AST 36  ALT 29  ALKPHOS 110  BILITOT 0.8  ALBUMIN 2.0*    Electrolytes Recent Labs  Lab 02/12/18 0500 02/13/18 0500 02/14/18 0335  CALCIUM 7.5* 8.1* 7.6*  MG 2.0 2.1 2.0  PHOS 3.1 4.1 3.9    CBC Recent Labs  Lab 02/12/18 0500 02/13/18 0500 02/14/18 0335  WBC 6.7 10.7* 10.2  HGB 9.6* 10.5* 9.5*  HCT 30.2* 32.7* 29.3*  PLT 343 399 370    ABG Recent Labs  Lab 02/12/18 2130 02/13/18 1600 02/14/18 0525   PHART 7.467* 7.356 7.367  PCO2ART 35.6 49.1* 44.9  PO2ART 60.5* 492.0* 83.1    Coag's Recent Labs  Lab 02/10/18 1404 02/11/18 1804  APTT 41* 63*    Sepsis Markers No results for input(s): LATICACIDVEN, PROCALCITON, O2SATVEN in the last 168 hours.  Cardiac Enzymes No results for input(s): TROPONINI, PROBNP in the last 168 hours.  PAST MEDICAL HISTORY :   He  has a past medical history of Bronchiectasis ([place]), CAD (coronary artery disease), COPD (chronic obstructive pulmonary disease) ([place]), Depression with anxiety, GERD (gastroesophageal reflux disease), Hypothyroidism, Myasthenia gravis ([place]), PE (pulmonary thromboembolism) ([place]), Sjogren's disease ([place]), and Tobacco abuse.  PAST SURGICAL HISTORY:  He  has a past surgical history that includes Right ulnar nerve impingement.  Allergies  Allergen Reactions  . Hydromorphone Other (See Comments)    hallucinations   . Immune Globulin Other (See Comments)     Bilateral pulmonary embolism      No current facility-administered medications on file prior to encounter.    Current Outpatient Medications on File Prior to Encounter  Medication Sig  . aspirin (ASPIRIN 81) 81 MG EC tablet Take 81 mg by mouth daily.  Marland Kitchen atorvastatin (LIPITOR) 40 MG tablet Take 40 mg by mouth at bedtime.  Marland Kitchen azathioprine (IMURAN) 100 MG tablet Take 200 mg by mouth at bedtime.  Marland Kitchen azelastine (ASTELIN) 0.1 % nasal spray Place 2 sprays into the nose 2 (two) times daily.  . baclofen (LIORESAL) 10 MG tablet Take 10 mg by mouth 3 (three) times daily.  . benzonatate (TESSALON) 200 MG capsule Take 1 capsule by mouth as needed for cough.  Marland Kitchen buPROPion (WELLBUTRIN SR) 150 MG 12 hr tablet Take 150 mg by mouth 2 (two) times daily.  . Calcium Carb-Ergocalciferol 500-200 MG-UNIT TABS Take 1 tablet by mouth daily.  . fluticasone (FLONASE) 50 MCG/ACT nasal spray Place 1 spray into the nose 2 (two) times daily.  Marland Kitchen guaiFENesin-dextromethorphan (ROBITUSSIN DM) 100-10 MG/5ML  syrup Take 10 mLs by mouth 2 (two) times daily as needed for cough.   Marland Kitchen HYDROcodone-acetaminophen (NORCO/VICODIN) 5-325 MG tablet Take 1 tablet by mouth 3 (three) times daily as needed.  Marland Kitchen ipratropium (ATROVENT) 0.03 % nasal spray Place 1 spray into both nostrils every 12 (twelve) hours.  Marland Kitchen ipratropium-albuterol (DUONEB) 0.5-2.5 (3) MG/3ML SOLN Inhale 3 mLs into the lungs every 6 (six) hours.  Marland Kitchen levothyroxine (SYNTHROID, LEVOTHROID) 175 MCG tablet Take 175 mcg by mouth daily before breakfast.  . mirtazapine (REMERON) 15 MG tablet Take 30 mg by mouth at bedtime.  . montelukast (SINGULAIR) 10 MG tablet Take 10 mg by mouth at bedtime.  Marland Kitchen morphine (MS CONTIN) 15 MG 12 hr tablet Take 15 mg by mouth at bedtime.  . Multiple Vitamins-Minerals (ICAPS AREDS 2 PO) Take 1 tablet by mouth 2 (two) times  daily.  . omeprazole (PRILOSEC) 40 MG capsule Take 40 mg by mouth 2 (two) times daily.  . ondansetron (ZOFRAN) 4 MG tablet Take 4 mg by mouth every 6 (six) hours as needed for nausea or vomiting.   . pregabalin (LYRICA) 50 MG capsule Take 50 mg by mouth at bedtime.  . pyridostigmine (MESTINON) 60 MG tablet Take 60 mg by mouth 4 (four) times daily.   . rivaroxaban (XARELTO) 20 MG TABS tablet Take 20 mg by mouth daily.  . Tiotropium Bromide-Olodaterol (STIOLTO RESPIMAT) 2.5-2.5 MCG/ACT AERS Inhale 2 puffs into the lungs daily.  Marland Kitchen venlafaxine (EFFEXOR) 75 MG tablet Take 225 mg by mouth daily.  . vitamin B-12 (CYANOCOBALAMIN) 1000 MCG tablet Take 1,000 mcg by mouth daily.    FAMILY HISTORY:   His family history includes Heart attack in his father.  SOCIAL HISTORY:  He  reports that he has been smoking cigarettes. He has been smoking about 0.50 packs per day. He has never used smokeless tobacco. He reports that he drinks alcohol. He reports that he does not use drugs.   Critical care time: 40 min

## 2018-02-15 ENCOUNTER — Inpatient Hospital Stay (HOSPITAL_COMMUNITY): Payer: Medicare HMO

## 2018-02-15 DIAGNOSIS — R451 Restlessness and agitation: Secondary | ICD-10-CM

## 2018-02-15 DIAGNOSIS — R0603 Acute respiratory distress: Secondary | ICD-10-CM

## 2018-02-15 DIAGNOSIS — Z978 Presence of other specified devices: Secondary | ICD-10-CM

## 2018-02-15 LAB — CULTURE, BAL-QUANTITATIVE

## 2018-02-15 LAB — CULTURE, BAL-QUANTITATIVE W GRAM STAIN
Culture: NO GROWTH
Culture: NO GROWTH

## 2018-02-15 LAB — GLUCOSE, CAPILLARY
GLUCOSE-CAPILLARY: 110 mg/dL — AB (ref 70–99)
Glucose-Capillary: 106 mg/dL — ABNORMAL HIGH (ref 70–99)
Glucose-Capillary: 114 mg/dL — ABNORMAL HIGH (ref 70–99)
Glucose-Capillary: 86 mg/dL (ref 70–99)
Glucose-Capillary: 90 mg/dL (ref 70–99)
Glucose-Capillary: 96 mg/dL (ref 70–99)
Glucose-Capillary: 97 mg/dL (ref 70–99)

## 2018-02-15 LAB — BASIC METABOLIC PANEL
Anion gap: 11 (ref 5–15)
BUN: 35 mg/dL — ABNORMAL HIGH (ref 8–23)
CHLORIDE: 98 mmol/L (ref 98–111)
CO2: 24 mmol/L (ref 22–32)
Calcium: 7.7 mg/dL — ABNORMAL LOW (ref 8.9–10.3)
Creatinine, Ser: 1.12 mg/dL (ref 0.61–1.24)
GFR calc non Af Amer: >60 mL/min (ref >60)
GLUCOSE: 108 mg/dL — AB (ref 70–99)
Potassium: 5 mmol/L (ref 3.5–5.1)
Sodium: 133 mmol/L — ABNORMAL LOW (ref 135–145)

## 2018-02-15 LAB — POCT I-STAT 3, ART BLOOD GAS (G3+)
Acid-base deficit: 4 mmol/L — ABNORMAL HIGH (ref 0.0–2.0)
BICARBONATE: 23 mmol/L (ref 20.0–28.0)
O2 Saturation: 93 %
PO2 ART: 84 mmHg (ref 83.0–108.0)
TCO2: 25 mmol/L (ref 22–32)
pCO2 arterial: 53.8 mmHg — ABNORMAL HIGH (ref 32.0–48.0)
pH, Arterial: 7.245 — ABNORMAL LOW (ref 7.350–7.450)

## 2018-02-15 LAB — CBC
HCT: 32 % — ABNORMAL LOW (ref 39.0–52.0)
Hemoglobin: 10.3 g/dL — ABNORMAL LOW (ref 13.0–17.0)
MCH: 32.1 pg (ref 26.0–34.0)
MCHC: 32.2 g/dL (ref 30.0–36.0)
MCV: 99.7 fL (ref 78.0–100.0)
PLATELETS: 477 10*3/uL — AB (ref 150–400)
RBC: 3.21 MIL/uL — AB (ref 4.22–5.81)
RDW: 14 % (ref 11.5–15.5)
WBC: 11.3 10*3/uL — ABNORMAL HIGH (ref 4.0–10.5)

## 2018-02-15 LAB — MAGNESIUM: MAGNESIUM: 2.2 mg/dL (ref 1.7–2.4)

## 2018-02-15 LAB — TRIGLYCERIDES: Triglycerides: 115 mg/dL (ref <150)

## 2018-02-15 LAB — PHOSPHORUS: PHOSPHORUS: 3.7 mg/dL (ref 2.5–4.6)

## 2018-02-15 MED ORDER — SENNOSIDES-DOCUSATE SODIUM 8.6-50 MG PO TABS
1.0000 | ORAL_TABLET | Freq: Two times a day (BID) | ORAL | Status: DC
  Administered 2018-02-15 – 2018-02-22 (×13): 1
  Filled 2018-02-15 (×19): qty 1

## 2018-02-15 MED ORDER — PHENYLEPHRINE 40 MCG/ML (10ML) SYRINGE FOR IV PUSH (FOR BLOOD PRESSURE SUPPORT)
200.0000 ug | PREFILLED_SYRINGE | Freq: Once | INTRAVENOUS | Status: AC
  Administered 2018-02-15: 200 ug via INTRAVENOUS
  Filled 2018-02-15: qty 5

## 2018-02-15 MED ORDER — METOCLOPRAMIDE HCL 5 MG/ML IJ SOLN
5.0000 mg | Freq: Once | INTRAMUSCULAR | Status: AC
  Administered 2018-02-15: 5 mg via INTRAVENOUS
  Filled 2018-02-15: qty 1

## 2018-02-15 MED ORDER — PROPOFOL 1000 MG/100ML IV EMUL
0.0000 ug/kg/min | INTRAVENOUS | Status: DC
  Administered 2018-02-15: 20 ug/kg/min via INTRAVENOUS
  Administered 2018-02-16 (×2): 35 ug/kg/min via INTRAVENOUS
  Administered 2018-02-16: 40 ug/kg/min via INTRAVENOUS
  Administered 2018-02-17: 25 ug/kg/min via INTRAVENOUS
  Administered 2018-02-17: 30 ug/kg/min via INTRAVENOUS
  Administered 2018-02-17: 20 ug/kg/min via INTRAVENOUS
  Administered 2018-02-18: 30 ug/kg/min via INTRAVENOUS
  Filled 2018-02-15 (×8): qty 100

## 2018-02-15 MED ORDER — PROPOFOL 1000 MG/100ML IV EMUL
INTRAVENOUS | Status: AC
  Administered 2018-02-15: 20 ug/kg/min via INTRAVENOUS
  Filled 2018-02-15: qty 100

## 2018-02-15 MED ORDER — INSULIN ASPART 100 UNIT/ML ~~LOC~~ SOLN: 0.0000 [IU] | SUBCUTANEOUS | Status: DC

## 2018-02-15 MED ORDER — DOCUSATE SODIUM 50 MG/5ML PO LIQD
100.0000 mg | Freq: Two times a day (BID) | ORAL | Status: DC
  Filled 2018-02-15: qty 10

## 2018-02-15 MED ORDER — MIDAZOLAM HCL 2 MG/2ML IJ SOLN
2.0000 mg | INTRAMUSCULAR | Status: DC | PRN
  Administered 2018-02-15 – 2018-02-19 (×10): 2 mg via INTRAVENOUS
  Filled 2018-02-15 (×8): qty 2

## 2018-02-15 NOTE — Progress Notes (Signed)
RN entered patients room with patient awake with ETT tube out. Elink and respiratory notified, sedation stopped, patient placed on 100% NRB. RN will continue to monitor.

## 2018-02-15 NOTE — Progress Notes (Addendum)
Curtis Preston  BSW:[id number] DOB: 12/30/1950 DOA: 02/06/2018 PCP: No primary care provider on file.    LOS: 7 days   Reason for Consult / Chief Complaint:  Pneumonia in immunocompromised patient.   Consulting MD and Date of Consult:  Dr. Algis Liming / [facility]  HPI/summary of hospital stay:  67 year old male, smoker, with PMH as below, which is significant for Myasthenia gravis on imuran, COPD, PE on Xarelto, and CAD. He was in his usual state of health (gets winded daily) until about early August of this year, when he developed worsening shortness of breath with associated productive cough (green sputum). This persisted for about 6 weeks, until about 9/6, when his symptoms significant worsened after traveling here to [place] from [place] (where he lives). SOB worsened as did his productive cough, which caused him to present to ED. Workup in ED suggested pneumonia and he was started on empiric antibiotics. CT demonstrated bilateral ground glass, and most concerning for multi-focal pneumonia. Infectious disease consultation sees benefit in bronchoscopy for culture. PCCM consulted and recommended transfer to the intensive care unit for monitoring of respiratory status.  Subjective  RN reports pt having periods of intermittent agitation despite fentanyl, precedex gtt's.  Pt becomes sweaty with episodes.  Afebrile.  I/O -9.3L+ for admit.    Wife Curtis Preston [phone]) reports pt was given a steroid injection at PCP office for cough prior to travel.  He was in [place] & feels he was sprayed with agent orange - was hospitalized after [place] for PNA.  Has been hospitalized several times for PNA over the years but never been on the vent.  Has had long admits at the [place] (18-30 days) but never been put on vent.  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  Smoked as a teen & had periods of quitting for several years but would  pick it back up.    Assessment & Plan:   Acute hypoxemic respiratory failure secondary to bilateral ground glass opacities, - DDx includes CAP in an immunosuppressed patient, possible atypical organism, LIP, RBILD / DIP Hx ILD - CT imaging with BL GGO and multiple intraparenchymal cyst/pneumatocele formation, LDH 311, PJP negative from tracheal aspirate, pending from BAL.   Tobacco Abuse - restarted back smoking 7 months ago, had quit 3 years ago  P: PRVC 8cc/kg  Reduce rate to 16 Wean PEEP / FiO2 for sats >90% Daily SBT / WUA as tolerated  Antibiotics per ID  Follow FOB studies  Consider VATS biopsy given unusual appearance of CT, negative culture data   Pulmonary Embolism (Hx)  - on xarelto as outpatient  P: Continue xarelto PT  Monitor for bleeding   Hemoptysis P: Monitor, no evidence of bleeding 9/15  COPD P: Continue duoneb Q6   Myasthenia Gravis, questionable exacerbation.  Previously on IVIG but taken off as was "getting worse" .  Typically has ocular / bulbar symptoms P: Neurology following, appreciate input  Imuran on hold per Neuro  Continue mestinon at current dose  No role IVIG at this time given increased risk recurrent VTE PT efforts   At Risk Malnutrition  Weight Loss - reported 30 lbs  Nausea / Vomiting  Abdominal Distention  P: Hold TF with vomiting  Reglan 17m IV x1 Bowel regimen > senokot-s BID, hold for diarrhea    Consultants: date of consult/date signed off (if applicable)/final recs  ID  Procedures: FOB 9/13 >>   Significant Diagnostic Tests: CT chest 9/9 > Extensive confluent ground-glass attenuation throughout both lungs with consolidation more prevalent in the superior segment of the left lower lobe. The findings are nonspecific, but most concerning for multi lobar pneumonia. Edema or possibly could have a similar appearance. This is to extensive for atelectasis. Consolidation in the superior segment in the left lower lobe suggests  infection.  Micro Data: Blood 9/6 > Sputum 9/6 > RVP 9/7 >> Negative PJP Sputum 9/10 >> negative  BAL 9/15 >>  BAL AFB 9/15 >>  BAL Fungus 9/15 >>  PJP BAL 9/15 >>   Antimicrobials:  Azithromycin 9/6 > 9/9 Cefepime 9/6 > 9/9  Vancomycin 9/6 >> 9/9 Ciprofloxacin 9/9 >>  Lines: ETT 9/13 >>  R [place] TLC 9/13 >>   Objective    Examination: General: adult male on vent, agitated / thrashing in bed, sweaty  HEENT: MM pink/moist, ETT Neuro: Awake, alert, redirectable at times, MAE, good strength  CV: s1s2 rrr, no m/r/g PULM: even/non-labored, lungs bilaterally diminished with crackles at bases  HG:DJME, non-tender, bsx4 active  Extremities: warm/dry, no edema  Skin: no rashes or lesions    Blood pressure 110/62, pulse 76, temperature 98.9 F (37.2 C), temperature source Axillary, resp. rate (!) 22, height _0  (1.854 m), weight 74.7 kg, SpO2 96 %.    Vent Mode: PRVC FiO2 (%):  [40 %-60 %] 60 % Set Rate:  [20 bmp] 20 bmp Vt Set:  [640 mL] 640 mL PEEP:  [8 cmH20] 8 cmH20 Pressure Support:  [12 cmH20] 12 cmH20 Plateau Pressure:  [16 cmH20-26 cmH20] 24 cmH20   Intake/Output Summary (Last 24 hours) at 02/15/2018 1101 Last data filed at 02/15/2018 0933 Gross per 24 hour  Intake 4203.45 ml  Output 1560 ml  Net 2643.45 ml   Filed Weights   02/13/18 0418 02/14/18 0400 02/15/18 0425  Weight: 73.7 kg 77.3 kg 74.7 kg   Labs    CBC: Recent Labs  Lab 02/10/18 0359 02/11/18 1804 02/12/18 0500 02/13/18 0500 02/14/18 0335 02/15/18 0401  WBC 9.2 7.6 6.7 10.7* 10.2 11.3*  NEUTROABS 7.3  --   --   --   --   --   HGB 11.4* 10.0* 9.6* 10.5* 9.5* 10.3*  HCT 33.8* 30.8* 30.2* 32.7* 29.3* 32.0*  MCV 97.7 98.7 100.3* 98.8 100.0 99.7  PLT 226 296 343 399 370 268*   Basic Metabolic Panel: Recent Labs  Lab 02/10/18 0359 02/12/18 0500 02/13/18 0500 02/14/18 0335 02/14/18 1617 02/15/18 0401  NA 138 138 137 135  --  133*  K 4.1 3.4* 4.1 4.2  --  5.0  CL 102 105 99 99  --   98  CO2 _1 --  24  GLUCOSE 112* 89 102* 93  --  108*  BUN 7* _2 --  35*  CREATININE 0.89 0.77 0.84 0.88  --  1.12  CALCIUM 8.1* 7.5* 8.1* 7.6*  --  7.7*  MG  --  2.0 2.1 2.0 2.2 2.2  PHOS  --  3.1 4.1 3.9 3.7 3.7   GFR: Estimated Creatinine Clearance: 65.8 mL/min (by C-G formula based on SCr of 1.12 mg/dL). Recent Labs  Lab 02/12/18 0500 02/13/18 0500 02/14/18 0335 02/15/18 0401  WBC 6.7 10.7* 10.2 11.3*   Liver Function Tests: Recent Labs  Lab 02/10/18 0359  AST 36  ALT 29  ALKPHOS 110  BILITOT 0.8  PROT 5.4*  ALBUMIN  2.0*   No results for input(s): LIPASE, AMYLASE in the last 168 hours. No results for input(s): AMMONIA in the last 168 hours. ABG    Component Value Date/Time   PHART 7.367 02/14/2018 0525   PCO2ART 44.9 02/14/2018 0525   PO2ART 83.1 02/14/2018 0525   HCO3 25.2 02/14/2018 0525   TCO2 29 02/13/2018 1600   O2SAT 95.9 02/14/2018 0525    Coagulation Profile: No results for input(s): INR, PROTIME in the last 168 hours.   Cardiac Enzymes: No results for input(s): CKTOTAL, CKMB, CKMBINDEX, TROPONINI in the last 168 hours.   HbA1C: No results found for: HGBA1C   CBG: Recent Labs  Lab 02/14/18 1522 02/14/18 1922 02/15/18 0056 02/15/18 0421 02/15/18 [place]     Noe Gens, NP-C [facility] Pgr: [phone] or if no answer [phone] 02/15/2018, 12:07 PM

## 2018-02-15 NOTE — Significant Event (Signed)
[facility] PCCM  Pt self extubated at 8:10 PM Notified by Elink Pt placed on NRB maintained sats >90% however increased work of breathing Evaluated at bedside by PCCM night coverage Re-intubated  At 9:25 PM ETT 8 Thick mucous plug noted post intubation.  O2 sat >95% pt adequately sedated with propofol and versed for intubation Will stop precedex at this time and continue on Propofol and Fentanyl for sedation  CXR to follow  Please see procedure note by NP Janyth ContesEubanks who intubated patient with my assistance  Signed Dr Newell CoralKristen Dashay [facility] Loc[facility]

## 2018-02-15 NOTE — Procedures (Addendum)
Intubation Procedure Note Curtis Preston [id number] 10/08/1950  Procedure: Intubation Indications: Respiratory insufficiency  Procedure Details Consent: Risks of procedure as well as the alternatives and risks of each were explained to the (patient/caregiver).  Consent for procedure obtained. Time Out: Verified patient identification, verified procedure, site/side was marked, verified correct patient position, special equipment/implants available, medications/allergies/relevent history reviewed, required imaging and test results available.  Performed  Maximum sterile technique was used including mask.  3    Evaluation Hemodynamic Status: BP stable throughout; O2 sats: stable throughout Patient's Current Condition: stable Complications: No apparent complications Patient did tolerate procedure well. Chest X-ray ordered to verify placement.  CXR: pending   Jovita KussmaulKatalina Geraldy Akridge, AGACNP-BC [facility]  Pgr: [phone]  PCCM Pgr: [phone]

## 2018-02-15 NOTE — Progress Notes (Signed)
Subjective:  On the ventilator and in distress breathing rapidly and waving his arms around which are in restraint mittens   Antibiotics:  Anti-infectives (From admission, onward)   Start     Dose/Rate Route Frequency Ordered Stop   02/14/18 1400  imipenem-cilastatin (PRIMAXIN) 750 mg in sodium chloride 0.9 % 100 mL IVPB     750 mg 200 mL/hr over 30 Minutes Intravenous Every 8 hours 02/14/18 1031     02/13/18 1330  sulfamethoxazole-trimethoprim (BACTRIM) 370 mg in dextrose 5 % 500 mL IVPB     370 mg 348.8 mL/hr over 90 Minutes Intravenous Every 8 hours 02/13/18 1238     02/09/18 1600  ciprofloxacin (CIPRO) IVPB 400 mg  Status:  Discontinued     400 mg 200 mL/hr over 60 Minutes Intravenous Every 12 hours 02/09/18 1516 02/13/18 1140   02/09/18 1200  ciprofloxacin (CIPRO) tablet 500 mg  Status:  Discontinued     500 mg Oral 2 times daily 02/09/18 1159 02/09/18 1211   02/08/18 0100  vancomycin (VANCOCIN) IVPB 1000 mg/200 mL premix  Status:  Discontinued     1,000 mg 200 mL/hr over 60 Minutes Intravenous Every 24 hours 02/07/18 0107 02/09/18 1159   02/08/18 0000  azithromycin (ZITHROMAX) 250 mg in dextrose 5 % 125 mL IVPB  Status:  Discontinued     250 mg 125 mL/hr over 60 Minutes Intravenous Every 24 hours 02/07/18 0032 02/09/18 1159   02/07/18 0600  ceFEPIme (MAXIPIME) 1 g in sodium chloride 0.9 % 100 mL IVPB  Status:  Discontinued     1 g 200 mL/hr over 30 Minutes Intravenous Every 8 hours 02/07/18 0032 02/09/18 1159   02/07/18 0045  ceFEPIme (MAXIPIME) 1 g in sodium chloride 0.9 % 100 mL IVPB  Status:  Discontinued     1 g 200 mL/hr over 30 Minutes Intravenous Every 8 hours 02/07/18 0031 02/07/18 0032   02/07/18 0045  vancomycin (VANCOCIN) 1,500 mg in sodium chloride 0.9 % 500 mL IVPB     1,500 mg 250 mL/hr over 120 Minutes Intravenous  Once 02/07/18 0042 02/07/18 0559   02/06/18 2330  cefTRIAXone (ROCEPHIN) 1 g in sodium chloride 0.9 % 100 mL IVPB     1 g 200 mL/hr  over 30 Minutes Intravenous  Once 02/06/18 2326 02/07/18 0048   02/06/18 2330  azithromycin (ZITHROMAX) 500 mg in sodium chloride 0.9 % 250 mL IVPB     500 mg 250 mL/hr over 60 Minutes Intravenous  Once 02/06/18 2326 02/07/18 0201      Medications: Scheduled Meds: . atorvastatin  40 mg Per Tube QHS  . baclofen  10 mg Per Tube TID  . calcium-vitamin D  1 tablet Per Tube Daily  . chlorhexidine gluconate (MEDLINE KIT)  15 mL Mouth Rinse BID  . chlorhexidine gluconate (MEDLINE KIT)  15 mL Mouth Rinse BID  . Chlorhexidine Gluconate Cloth  6 each Topical Daily  . feeding supplement (PRO-STAT SUGAR FREE 64)  30 mL Per Tube Daily  . Influenza vac split quadrivalent PF  0.5 mL Intramuscular Tomorrow-1000  . ipratropium-albuterol  3 mL Nebulization Q6H  . levothyroxine  175 mcg Per Tube QAC breakfast  . mouth rinse  15 mL Mouth Rinse 10 times per day  . metoCLOPramide (REGLAN) injection  5 mg Intravenous Once  . montelukast  10 mg Oral QHS  . pregabalin  50 mg Per Tube QHS  . pyridostigmine  30 mg Per Tube QID  .  rivaroxaban  20 mg Per Tube Q supper  . senna-docusate  1 tablet Per Tube BID  . sodium chloride flush  10-40 mL Intracatheter Q12H  . vitamin B-12  1,000 mcg Per Tube Daily   Continuous Infusions: . sodium chloride 10 mL/hr at 02/15/18 0933  . dexmedetomidine (PRECEDEX) IV infusion 1.5 mcg/kg/hr (02/15/18 1100)  . famotidine (PEPCID) IV 20 mg (02/14/18 2143)  . feeding supplement (VITAL AF 1.2 CAL) Stopped (02/15/18 0454)  . fentaNYL infusion INTRAVENOUS 400 mcg/hr (02/15/18 0933)  . imipenem-cilastatin 750 mg (02/15/18 0530)  . norepinephrine (LEVOPHED) Adult infusion Stopped (02/14/18 0554)  . sulfamethoxazole-trimethoprim 370 mg (02/15/18 0533)   PRN Meds:.sodium chloride, fentaNYL, midazolam, sodium chloride flush    Objective: Weight change: -2.6 kg  Intake/Output Summary (Last 24 hours) at 02/15/2018 1133 Last data filed at 02/15/2018 0933 Gross per 24 hour    Intake 4203.45 ml  Output 1560 ml  Net 2643.45 ml   Blood pressure (!) 122/98, pulse 94, temperature 98.9 F (37.2 C), temperature source Axillary, resp. rate 14, height _0  (1.854 m), weight 74.7 kg, SpO2 93 %. Temp:  [97.9 F (36.6 C)-99.3 F (37.4 C)] 98.9 F (37.2 C) (09/15 0745) Pulse Rate:  [71-100] 94 (09/15 1100) Resp:  [14-27] 14 (09/15 1100) BP: (90-137)/(58-98) 122/98 (09/15 1100) SpO2:  [80 %-96 %] 93 % (09/15 1100) FiO2 (%):  [40 %-60 %] 60 % (09/15 0756) Weight:  [74.7 kg] 74.7 kg (09/15 0425)  Physical Exam: General: In agitated and in distress breathing quickly.  I alerted Brandi with critical care team as well as his nurse Skin: no rashes Neuro: nonfocal  CBC:    BMET Recent Labs    02/14/18 0335 02/15/18 0401  NA 135 133*  K 4.2 5.0  CL 99 98  CO2 25 24  GLUCOSE 93 108*  BUN 20 35*  CREATININE 0.88 1.12  CALCIUM 7.6* 7.7*     Liver Panel  No results for input(s): PROT, ALBUMIN, AST, ALT, ALKPHOS, BILITOT, BILIDIR, IBILI in the last 72 hours.     Sedimentation Rate No results for input(s): ESRSEDRATE in the last 72 hours. C-Reactive Protein No results for input(s): CRP in the last 72 hours.  Micro Results: Recent Results (from the past 720 hour(s))  Blood culture (routine x 2)     Status: None (Preliminary result)   Collection Time: 02/06/18 11:35 PM  Result Value Ref Range Status   Specimen Description BLOOD RIGHT ANTECUBITAL  Final   Special Requests   Final    BOTTLES DRAWN AEROBIC AND ANAEROBIC Blood Culture adequate volume   Culture   Final    NO GROWTH 7 DAYS HOLDING FOR TEN DAYS Performed at [facility], [address] [place], [place] [address]    Report Status PENDING  Incomplete  Blood culture (routine x 2)     Status: None (Preliminary result)   Collection Time: 02/06/18 11:45 PM  Result Value Ref Range Status   Specimen Description BLOOD LEFT HAND  Final   Special Requests   Final    BOTTLES DRAWN  AEROBIC AND ANAEROBIC Blood Culture adequate volume   Culture   Final    NO GROWTH 7 DAYS HOLDING FOR 10 DAYS Performed at [facility], [place] [address] [place], [place] [address]    Report Status PENDING  Incomplete  Respiratory Panel by PCR     Status: None   Collection Time: 02/07/18  8:24 AM  Result Value Ref  Range Status   Adenovirus NOT DETECTED NOT DETECTED Final   Coronavirus 229E NOT DETECTED NOT DETECTED Final   Coronavirus HKU1 NOT DETECTED NOT DETECTED Final   Coronavirus NL63 NOT DETECTED NOT DETECTED Final   Coronavirus OC43 NOT DETECTED NOT DETECTED Final   Metapneumovirus NOT DETECTED NOT DETECTED Final   Rhinovirus / Enterovirus NOT DETECTED NOT DETECTED Final   Influenza A NOT DETECTED NOT DETECTED Final   Influenza B NOT DETECTED NOT DETECTED Final   Parainfluenza Virus 1 NOT DETECTED NOT DETECTED Final   Parainfluenza Virus 2 NOT DETECTED NOT DETECTED Final   Parainfluenza Virus 3 NOT DETECTED NOT DETECTED Final   Parainfluenza Virus 4 NOT DETECTED NOT DETECTED Final   Respiratory Syncytial Virus NOT DETECTED NOT DETECTED Final   Bordetella pertussis NOT DETECTED NOT DETECTED Final   Chlamydophila pneumoniae NOT DETECTED NOT DETECTED Final   Mycoplasma pneumoniae NOT DETECTED NOT DETECTED Final    Comment: Performed at [facility], [place] [address], [place], [place] [address]  MRSA PCR Screening     Status: None   Collection Time: 02/09/18  2:56 PM  Result Value Ref Range Status   MRSA by PCR NEGATIVE NEGATIVE Final    Comment:        The GeneXpert MRSA Assay (FDA approved for NASAL specimens only), is one component of a comprehensive MRSA colonization surveillance program. It is not intended to diagnose MRSA infection nor to guide or monitor treatment for MRSA infections. Performed at [facility], [place] [address] [place], [place] [address]   Pneumocystis smear by DFA     Status: None   Collection Time: 02/10/18  7:32 PM  Result Value  Ref Range Status   Specimen Source-PJSRC SPUTUM  Final   Pneumocystis jiroveci Ag NEGATIVE  Final    Comment: Performed at [facility] Performed at [facility], [address], [place], [place] [address]   Culture, fungus without smear     Status: None (Preliminary result)   Collection Time: 02/13/18  2:02 PM  Result Value Ref Range Status   Specimen Description BRONCHIAL ALVEOLAR LAVAGE RIGHT  Final   Special Requests NONE  Final   Culture   Final    NO FUNGUS ISOLATED AFTER 1 DAY Performed at [facility], [place] [address] [place], [place] [address]    Report Status PENDING  Incomplete  Culture, bal-quantitative     Status: None (Preliminary result)   Collection Time: 02/13/18  2:02 PM  Result Value Ref Range Status   Specimen Description BRONCHIAL ALVEOLAR LAVAGE RIGHT  Final   Special Requests Immunocompromised  Final   Gram Stain   Final    FEW WBC PRESENT,BOTH PMN AND MONONUCLEAR NO ORGANISMS SEEN    Culture   Final    NO GROWTH 2 DAYS Performed at [facility], [address], [place], [place] [address]    Report Status PENDING  Incomplete  Culture, bal-quantitative     Status: None (Preliminary result)   Collection Time: 02/13/18  2:02 PM  Result Value Ref Range Status   Specimen Description BRONCHIAL ALVEOLAR LAVAGE LEFT  Final   Special Requests Immunocompromised  Final   Gram Stain   Final    FEW WBC PRESENT, PREDOMINANTLY PMN NO ORGANISMS SEEN    Culture   Final    NO GROWTH 2 DAYS Performed at [facility], [address], [place],  [address]  Report Status PENDING  Incomplete  Culture, fungus without smear     Status: None (Preliminary result)   Collection Time: 02/13/18  2:02 PM  Result Value Ref Range Status   Specimen Description BRONCHIAL ALVEOLAR LAVAGE LEFT  Final   Special Requests NONE  Final   Culture   Final    NO FUNGUS ISOLATED AFTER 1 DAY Performed at [facility], [place] [address],  [place], [place] [address]    Report Status PENDING  Incomplete    Studies/Results: Dg Abd 1 View  Result Date: 02/15/2018 CLINICAL DATA:  Nausea and vomiting EXAM: ABDOMEN - 1 VIEW COMPARISON:  None. FINDINGS: Scattered large and small bowel gas is noted. Cecum is mildly prominent at almost 12 cm. No free air is seen. Nasogastric catheter is noted within the stomach. No bony abnormality is seen. No mass lesion is noted. IMPRESSION: Gas dilated cecum to 12 cm. Nasogastric catheter within the stomach. Electronically Signed   By: Inez Catalina M.D.   On: 02/15/2018 09:46   Dg Chest Port 1 View  Result Date: 02/14/2018 CLINICAL DATA:  History of endotracheal tube EXAM: PORTABLE CHEST 1 VIEW COMPARISON:  Yesterday FINDINGS: Endotracheal tube tip between the clavicular heads and carina. An orogastric tube and side-port reaches the stomach. Gas is seen within the stomach although decreased from prior. Bilateral subclavian line with tip at the SVC. Diffuse interstitial and airspace opacity, stable. Normal heart size and stable mediastinal contours. No effusion or pneumothorax. IMPRESSION: 1. Unremarkable hardware positioning. 2. Persistent airspace disease in the setting of immunocompromise status post recent BAL. Electronically Signed   By: Monte Fantasia M.D.   On: 02/14/2018 08:12   Dg Chest Port 1 View  Result Date: 02/13/2018 CLINICAL DATA:  History of ETT EXAM: PORTABLE CHEST 1 VIEW COMPARISON:  02/13/2018, 02/12/2018, 02/10/2018, CT chest 02/08/2017 FINDINGS: Endotracheal tube tip is about 4.5 cm superior to the carina. Esophageal tube tip is below the diaphragm but non included. Bilateral central venous catheter tips over the SVC. Stable cardiomediastinal silhouette. Interval worsening of interstitial and ground-glass opacity within the bilateral lungs. No pneumothorax. IMPRESSION: 1. Endotracheal tube tip about 4.5 cm superior to the carina. 2. Slight worsening of diffuse bilateral interstitial and  ground-glass edema or infiltrates. Electronically Signed   By: Donavan Foil M.D.   On: 02/13/2018 23:39   Dg Chest Port 1 View  Result Date: 02/13/2018 CLINICAL DATA:  Endotracheal tube placement. EXAM: PORTABLE CHEST 1 VIEW COMPARISON:  02/12/2018 FINDINGS: Endotracheal tube has tip 7.3 cm above the carina. Left subclavian central venous catheter is unchanged. Interval placement of right subclavian central venous catheter with tip over the SVC. Lungs are adequately inflated demonstrate continued bilateral hazy interstitial process right worse than left with slight interval improved aeration. No effusion. No pneumothorax. Cardiomediastinal silhouette and remainder the exam is unchanged. IMPRESSION: Persistent bilateral hazy interstitial process with slight improved aeration. Tubes and lines as described.  No pneumothorax. Electronically Signed   By: Marin Olp M.D.   On: 02/13/2018 15:13      Assessment/Plan:  INTERVAL HISTORY: Tracheal aspirate was sent for AFB stain and culture yesterday.   Principal Problem:   Sepsis ([place]) Active Problems:   CAD (coronary artery disease)   COPD (chronic obstructive pulmonary disease) (HCC)   Depression with anxiety   GERD (gastroesophageal reflux disease)   Hypothyroidism   Myasthenia gravis ([place])   PE (pulmonary thromboembolism) (HCC)   Tobacco abuse   Multifocal pneumonia   Respiratory  failure ([place])   Acute respiratory failure with hypoxemia (HCC)   ARDS (adult respiratory distress syndrome) ([place])   Malnutrition of moderate degree    Jassiel Flye is a 67 y.o. male with history of myasthenia gravis who presented with pneumonia and a several month history of weight loss.  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  We had placed him on ciprofloxacin but his clinical status had deteriorated.  Dr. Baxter Flattery stopped the ciprofloxacin due  to her concerns that it was exacerbating myasthenia gravis.  His pulmonary status did indeed worsen yesterday he required endotracheal intubation and underwent bronc [place] with BAL.  If I understand the bronchoscopy report there was purulent material in the fluid in the lungs.  A noninfectious etiology is also in the differential.  He has been placed on treatment for PCP pneumonia though PCP DFA on a sputum is negative PCP on BAL is pending as are cultures from the BAL.   I  added imipenem to cover for nocardia and (which would also be covered by the Bactrim)  Follow-up cultures and serologies.  The fact that he worsened on ciprofloxacin I think would also argue against Francisella tularemia.  I have seen patient who presented with a smoldering non-resolving pneumonia at [facility] that did have tularemia as well and certainly is atypical presentation of this fairly virulent organism but still I felt it was worth treating given the clinical clues.  I am also sending AFB stains and cultures mainly with nontuberculous mycobacteria in mind.  That being said he is a [place] vet and has been abroad and has had 30 pounds of weight loss.  For thoroughness I have made another order for a tracheal aspirate today to send for AFB stain and culture could get a third tomorrow for thoroughness.   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 I worry that he may need a biopsy performed though that may be still problematic and would defer to CCM  when that could be appropriate.   Dr. Johnnye Sima to take over the service tomorrow.    LOS: 7 days   Alcide Evener 02/15/2018, 11:33 AM

## 2018-02-15 NOTE — Progress Notes (Signed)
Pt laid flat for a bath, tube feeding began running out of his mouth. Pt was distended. MD Deterding made aware. Tube feeding stopped.

## 2018-02-15 NOTE — Progress Notes (Signed)
eLink Physician-Brief Progress Note Patient Name: Quintella Batonerry Michael Rijo DOB: 10/05/1950 MRN: [id number]   Date of Service  02/15/2018  HPI/Events of Note  Patient self extubated. Now on 100% NRBM with sat = 90% and RR = 25.   eICU Interventions  Ground team notified to evaluate patient at bedside.      Intervention Category Major Interventions: Hypoxemia - evaluation and management  Taleisha Kaczynski Eugene 02/15/2018, 8:16 PM

## 2018-02-16 ENCOUNTER — Inpatient Hospital Stay (HOSPITAL_COMMUNITY): Payer: Medicare HMO

## 2018-02-16 DIAGNOSIS — E871 Hypo-osmolality and hyponatremia: Secondary | ICD-10-CM

## 2018-02-16 DIAGNOSIS — Z95828 Presence of other vascular implants and grafts: Secondary | ICD-10-CM

## 2018-02-16 LAB — BASIC METABOLIC PANEL
ANION GAP: 8 (ref 5–15)
BUN: 26 mg/dL — ABNORMAL HIGH (ref 8–23)
CALCIUM: 7.6 mg/dL — AB (ref 8.9–10.3)
CO2: 24 mmol/L (ref 22–32)
Chloride: 100 mmol/L (ref 98–111)
Creatinine, Ser: 1.02 mg/dL (ref 0.61–1.24)
GFR calc non Af Amer: >60 mL/min (ref >60)
Glucose, Bld: 84 mg/dL (ref 70–99)
POTASSIUM: 4.5 mmol/L (ref 3.5–5.1)
Sodium: 132 mmol/L — ABNORMAL LOW (ref 135–145)

## 2018-02-16 LAB — PNEUMOCYSTIS JIROVECI SMEAR BY DFA
PNEUMOCYSTIS JIROVECI AG: NEGATIVE
Pneumocystis jiroveci Ag: NEGATIVE

## 2018-02-16 LAB — CBC
HEMATOCRIT: 27.7 % — AB (ref 39.0–52.0)
Hemoglobin: 8.9 g/dL — ABNORMAL LOW (ref 13.0–17.0)
MCH: 32.6 pg (ref 26.0–34.0)
MCHC: 32.1 g/dL (ref 30.0–36.0)
MCV: 101.5 fL — ABNORMAL HIGH (ref 78.0–100.0)
PLATELETS: 318 10*3/uL (ref 150–400)
RBC: 2.73 MIL/uL — ABNORMAL LOW (ref 4.22–5.81)
RDW: 14.2 % (ref 11.5–15.5)
WBC: 12.2 10*3/uL — AB (ref 4.0–10.5)

## 2018-02-16 LAB — GLUCOSE, CAPILLARY
GLUCOSE-CAPILLARY: 147 mg/dL — AB (ref 70–99)
GLUCOSE-CAPILLARY: 74 mg/dL (ref 70–99)
GLUCOSE-CAPILLARY: 75 mg/dL (ref 70–99)
GLUCOSE-CAPILLARY: 88 mg/dL (ref 70–99)
Glucose-Capillary: 103 mg/dL — ABNORMAL HIGH (ref 70–99)
Glucose-Capillary: 80 mg/dL (ref 70–99)
Glucose-Capillary: 81 mg/dL (ref 70–99)
Glucose-Capillary: 117 mg/dL — ABNORMAL HIGH (ref 70–99)

## 2018-02-16 LAB — ACID FAST SMEAR (AFB, MYCOBACTERIA): Acid Fast Smear: NEGATIVE

## 2018-02-16 MED ORDER — MIRTAZAPINE 15 MG PO TBDP
15.0000 mg | ORAL_TABLET | Freq: Every day | ORAL | Status: DC
  Administered 2018-02-16 – 2018-02-19 (×3): 15 mg via ORAL
  Filled 2018-02-16 (×4): qty 1

## 2018-02-16 MED ORDER — CLONAZEPAM 0.5 MG PO TABS
1.0000 mg | ORAL_TABLET | Freq: Two times a day (BID) | ORAL | Status: DC
  Administered 2018-02-16 – 2018-02-18 (×4): 1 mg
  Filled 2018-02-16 (×4): qty 2

## 2018-02-16 MED ORDER — POLYETHYLENE GLYCOL 3350 17 GM/SCOOP PO POWD: 1.0000 | Freq: Two times a day (BID) | ORAL | Status: DC | PRN

## 2018-02-16 MED ORDER — QUETIAPINE FUMARATE 50 MG PO TABS
50.0000 mg | ORAL_TABLET | Freq: Two times a day (BID) | ORAL | Status: DC
  Administered 2018-02-16 – 2018-02-20 (×7): 50 mg via ORAL
  Filled 2018-02-16 (×8): qty 1

## 2018-02-16 MED ORDER — POLYETHYLENE GLYCOL 3350 17 G PO PACK
17.0000 g | PACK | Freq: Two times a day (BID) | ORAL | Status: DC | PRN
  Administered 2018-02-16 – 2018-02-18 (×2): 17 g via ORAL
  Filled 2018-02-16 (×3): qty 1

## 2018-02-16 MED ORDER — MORPHINE SULFATE (CONCENTRATE) 10 MG/0.5ML PO SOLN
10.0000 mg | Freq: Four times a day (QID) | ORAL | Status: DC
  Administered 2018-02-16 – 2018-02-19 (×7): 10 mg via ORAL
  Filled 2018-02-16 (×8): qty 0.5

## 2018-02-16 NOTE — Progress Notes (Signed)
[facility] for Infectious Disease  Date of Admission:  02/06/2018     Total days of antibiotics 10         ASSESSMENT/PLAN  Curtis Preston continues to have respiratory failure with continued need for ventilatory support. Bronchoscopy performed on 02/13/18 with no growth on gram stain and cultures remain without growth to date. Continues to receive treatment for multifocal pneumonia with imipenem-cilastatin and sulfamethoxazole-trimethaprim. He has had a total course of 10 days of varied antibiotics with question if this is a bacterial infection, atypical infection, autoimmune or other process. If treating multifocal (? Aspiration) pneumonia he should be fairly well covered by previous antibiotics with chest x-rays remaining without significant change and afebrile. AFB cultures have been sent with concern for TB or possible atypical infection.  1. Continue current dose of imipenem-cilastatin and sulfamethoxazole with potential of sopping antibiotics in the next 1-2 days if no changes. 2. Continue to monitor cultures.  3. Will be available as needed.    Principal Problem:   Sepsis ([place]) Active Problems:   CAD (coronary artery disease)   COPD (chronic obstructive pulmonary disease) (HCC)   Depression with anxiety   GERD (gastroesophageal reflux disease)   Hypothyroidism   Myasthenia gravis ([place])   PE (pulmonary thromboembolism) (HCC)   Tobacco abuse   Multifocal pneumonia   Respiratory failure (HCC)   Acute respiratory failure with hypoxemia (HCC)   ARDS (adult respiratory distress syndrome) (HCC)   Malnutrition of moderate degree   Endotracheally intubated   . atorvastatin  40 mg Per Tube QHS  . baclofen  10 mg Per Tube TID  . calcium-vitamin D  1 tablet Per Tube Daily  . chlorhexidine gluconate (MEDLINE KIT)  15 mL Mouth Rinse BID  . Chlorhexidine Gluconate Cloth  6 each Topical Daily  . feeding supplement (PRO-STAT SUGAR FREE 64)  30 mL Per Tube Daily  . Influenza vac  split quadrivalent PF  0.5 mL Intramuscular Tomorrow-1000  . ipratropium-albuterol  3 mL Nebulization Q6H  . levothyroxine  175 mcg Per Tube QAC breakfast  . mouth rinse  15 mL Mouth Rinse 10 times per day  . montelukast  10 mg Oral QHS  . pregabalin  50 mg Per Tube QHS  . pyridostigmine  30 mg Per Tube QID  . rivaroxaban  20 mg Per Tube Q supper  . senna-docusate  1 tablet Per Tube BID  . sodium chloride flush  10-40 mL Intracatheter Q12H  . vitamin B-12  1,000 mcg Per Tube Daily    SUBJECTIVE:  Mild elevated temperature last evening of 100.3. WBC count is stable. No significant changes on chest x-ray. Self-extubated yesterday and required re-intubation secondary to work of breathing. Tracheal aspirates have are negative to date.   Allergies  Allergen Reactions  . Hydromorphone Other (See Comments)    hallucinations   . Immune Globulin Other (See Comments)     Bilateral pulmonary embolism       Review of Systems: Review of Systems  Unable to perform ROS: Intubated      OBJECTIVE: Vitals:   02/16/18 0500 02/16/18 0600 02/16/18 0700 02/16/18 0800  BP: (!) 104/54 (!) 104/51 (!) 91/47 (!) 96/52  Pulse: 75 77 71 70  Resp: _0 Temp:   98.1 F (36.7 C)   TempSrc:   Oral   SpO2: 99% 99% 97% 99%  Weight: 80.1 kg     Height:       Body mass index is  23.3 kg/m.  Physical Exam  Constitutional: He is oriented to person, place, and time. He appears well-developed and well-nourished. He appears ill. No distress. He is sedated, intubated and restrained.  Cardiovascular: Normal rate, regular rhythm, normal heart sounds and intact distal pulses. Exam reveals no gallop and no friction rub.  No murmur heard. Central line appears patent, clean, dry and intact.   Pulmonary/Chest: Effort normal. No stridor. He is intubated. No respiratory distress. He has no wheezes. He has no rales. He exhibits no tenderness.  On PRVC  Abdominal: Soft. Bowel sounds are normal. He  exhibits no mass. There is no tenderness. There is no guarding.  Neurological: He is oriented to person, place, and time.  Skin: Skin is warm and dry.  Psychiatric: He has a normal mood and affect. His behavior is normal. Judgment and thought content normal.    Lab Results Lab Results  Component Value Date   WBC 12.2 (H) 02/16/2018   HGB 8.9 (L) 02/16/2018   HCT 27.7 (L) 02/16/2018   MCV 101.5 (H) 02/16/2018   PLT 318 02/16/2018    Lab Results  Component Value Date   CREATININE 1.02 02/16/2018   BUN 26 (H) 02/16/2018   NA 132 (L) 02/16/2018   K 4.5 02/16/2018   CL 100 02/16/2018   CO2 24 02/16/2018    Lab Results  Component Value Date   ALT 29 02/10/2018   AST 36 02/10/2018   ALKPHOS 110 02/10/2018   BILITOT 0.8 02/10/2018     Microbiology: Recent Results (from the past 240 hour(s))  Blood culture (routine x 2)     Status: None (Preliminary result)   Collection Time: 02/06/18 11:35 PM  Result Value Ref Range Status   Specimen Description BLOOD RIGHT ANTECUBITAL  Final   Special Requests   Final    BOTTLES DRAWN AEROBIC AND ANAEROBIC Blood Culture adequate volume   Culture   Final    NO GROWTH 9 DAYS HOLDING FOR TEN DAYS Performed at [facility], [place] [address] [place], [place] [address]    Report Status PENDING  Incomplete  Blood culture (routine x 2)     Status: None (Preliminary result)   Collection Time: 02/06/18 11:45 PM  Result Value Ref Range Status   Specimen Description BLOOD LEFT HAND  Final   Special Requests   Final    BOTTLES DRAWN AEROBIC AND ANAEROBIC Blood Culture adequate volume   Culture   Final    NO GROWTH 9 DAYS HOLDING FOR 10 DAYS Performed at [facility], [place] [address], [place], [place] [address]    Report Status PENDING  Incomplete  Respiratory Panel by PCR     Status: None   Collection Time: 02/07/18  8:24 AM  Result Value Ref Range Status   Adenovirus NOT DETECTED NOT DETECTED Final   Coronavirus 229E NOT DETECTED  NOT DETECTED Final   Coronavirus HKU1 NOT DETECTED NOT DETECTED Final   Coronavirus NL63 NOT DETECTED NOT DETECTED Final   Coronavirus OC43 NOT DETECTED NOT DETECTED Final   Metapneumovirus NOT DETECTED NOT DETECTED Final   Rhinovirus / Enterovirus NOT DETECTED NOT DETECTED Final   Influenza A NOT DETECTED NOT DETECTED Final   Influenza B NOT DETECTED NOT DETECTED Final   Parainfluenza Virus 1 NOT DETECTED NOT DETECTED Final   Parainfluenza Virus 2 NOT DETECTED NOT DETECTED Final   Parainfluenza Virus 3 NOT DETECTED NOT DETECTED Final   Parainfluenza Virus 4 NOT DETECTED NOT DETECTED  Final   Respiratory Syncytial Virus NOT DETECTED NOT DETECTED Final   Bordetella pertussis NOT DETECTED NOT DETECTED Final   Chlamydophila pneumoniae NOT DETECTED NOT DETECTED Final   Mycoplasma pneumoniae NOT DETECTED NOT DETECTED Final    Comment: Performed at [facility], [place] [address] [place], [place] [address]  MRSA PCR Screening     Status: None   Collection Time: 02/09/18  2:56 PM  Result Value Ref Range Status   MRSA by PCR NEGATIVE NEGATIVE Final    Comment:        The GeneXpert MRSA Assay (FDA approved for NASAL specimens only), is one component of a comprehensive MRSA colonization surveillance program. It is not intended to diagnose MRSA infection nor to guide or monitor treatment for MRSA infections. Performed at [facility], [place] [address], [place], [place] [address]   Pneumocystis smear by DFA     Status: None   Collection Time: 02/10/18  7:32 PM  Result Value Ref Range Status   Specimen Source-PJSRC SPUTUM  Final   Pneumocystis jiroveci Ag NEGATIVE  Final    Comment: Performed at [facility] Performed at [facility], [address], [place], [place] [address]   Culture, fungus without smear     Status: None (Preliminary result)   Collection Time: 02/13/18  2:02 PM  Result Value Ref Range Status   Specimen Description BRONCHIAL ALVEOLAR LAVAGE RIGHT   Final   Special Requests NONE  Final   Culture   Final    NO FUNGUS ISOLATED AFTER 2 DAYS Performed at [place] [address], [place], [place] [address]    Report Status PENDING  Incomplete  Culture, bal-quantitative     Status: None   Collection Time: 02/13/18  2:02 PM  Result Value Ref Range Status   Specimen Description BRONCHIAL ALVEOLAR LAVAGE RIGHT  Final   Special Requests Immunocompromised  Final   Gram Stain   Final    FEW WBC PRESENT,BOTH PMN AND MONONUCLEAR NO ORGANISMS SEEN    Culture   Final    NO GROWTH 2 DAYS Performed at [facility], [address], [place], [place] [address]    Report Status 02/15/2018 FINAL  Final  Culture, bal-quantitative     Status: None   Collection Time: 02/13/18  2:02 PM  Result Value Ref Range Status   Specimen Description BRONCHIAL ALVEOLAR LAVAGE LEFT  Final   Special Requests Immunocompromised  Final   Gram Stain   Final    FEW WBC PRESENT, PREDOMINANTLY PMN NO ORGANISMS SEEN    Culture   Final    NO GROWTH 2 DAYS Performed at [facility], [place] [address], [place], [place] [address]    Report Status 02/15/2018 FINAL  Final  Culture, fungus without smear     Status: None (Preliminary result)   Collection Time: 02/13/18  2:02 PM  Result Value Ref Range Status   Specimen Description BRONCHIAL ALVEOLAR LAVAGE LEFT  Final   Special Requests NONE  Final   Culture   Final    NO FUNGUS ISOLATED AFTER 2 DAYS Performed at [facility], [place] [address], [place], [place] [address]    Report Status PENDING  Incomplete     Terri Piedra, [place] for [facility] [phone] Pager  02/16/2018  8:36 AM

## 2018-02-16 NOTE — Progress Notes (Signed)
eLink Physician-Brief Progress Note Patient Name: Quintella Batonerry Michael Demeo DOB: 07/02/1950 MRN: [id number]   Date of Service  02/16/2018  HPI/Events of Note  Agitation - Request to renew restraint orders.   eICU Interventions  Will renew restraint orders.      Intervention Category Minor Interventions: Agitation / anxiety - evaluation and management  Lenell AntuSommer,Steven Eugene 02/16/2018, 11:38 PM

## 2018-02-16 NOTE — Progress Notes (Signed)
Cortrak Tube Team Note:  Consult received to place a Cortrak feeding tube.   A 10 F Cortrak tube was placed in the R nare and secured with a nasal bridle at 100 cm. Per the Cortrak monitor reading the tube tip is post pyloric.   X-ray is required, abdominal x-ray has been ordered by the Cortrak team. Please confirm tube placement before using the Cortrak tube.   If the tube becomes dislodged please keep the tube and contact the Cortrak team at [email or web address] (password [email or web address]) for replacement.  If after hours and replacement cannot be delayed, place a NG tube and confirm placement with an abdominal x-ray.    Kendell BaneHeather Alisi Lupien RD, LDN, CNSC [phone] Pager [phone] After Hours Pager

## 2018-02-16 NOTE — Progress Notes (Signed)
PULMONARY / CRITICAL CARE MEDICINE   NAME:  Quintella Batonerry Michael Roll, MRN:  [id number], DOB:  03/17/1951, LOS: 8 ADMISSION DATE:  02/06/2018, CONSULTATION DATE:  02/09/2018 REFERRING MD:  Waymon AmatoHongalgi, CHIEF COMPLAINT:  PNA in an immunocompromised patient  BRIEF HISTORY:    67 year old male with PMH as below, which is significant for Myasthenia gravis on imuran, COPD, PE on Xarelto, and CAD. He was in his usual state of health (gets winded daily) until about early August of this year, when he developed worsening shortness of breath with associated productive cough (green sputum). This persisted for about 6 weeks, until about 9/6, when his symptoms significant worsened after traveling here to [place] from [place] (where [place] lives). SOB worsened as did his productive cough, which caused him to present to ED. Workup in ED suggested pneumonia and he was started on empiric antibiotics. CT demonstrated bilateral ground glass, and most concerning for multi-focal pneumonia. Infectious disease consultation sees benefit in bronchoscopy for culture. PCCM consulted and recommended transfer to the intensive care unit for monitoring of respiratory status.  SIGNIFICANT EVENTS:   Re-intubated for hypoxia yesterday.  Subsequent self-extubation, but not able to manage secretions, so intubated again.   SUBJECTIVE:  Indicating no specific c/o when aroused. Difficult sedation.  CONSTITUTIONAL: BP (!) 100/54   Pulse 82   Temp 98.1 F (36.7 C) (Oral)   Resp 17   Ht 6\' 1"  (1.854 m)   Wt 80.1 kg   SpO2 96%   BMI 23.30 kg/m   I/O last 3 completed shifts: In: 8362.6 [I.V.:2978.4; NG/GT:540; IV Piggyback:4844.2] Out: 2620 [Urine:2420; Emesis/NG output:200]     Vent Mode: PRVC FiO2 (%):  [40 %-60 %] 40 % Set Rate:  [16 bmp] 16 bmp Vt Set:  [640 mL] 640 mL PEEP:  [8 cmH20] 8 cmH20 Plateau Pressure:  [19 cmH20-28 cmH20] 19 cmH20  PHYSICAL EXAM: General:  WD/Wn NAD Neuro:  No focal findings HEENT:  [place]/AT; PRRL, EOMI;  ETT/OGT in place Cardiovascular:  RRR, no m/r/g Lungs:  Clear throughout. Abdomen: Distended with rare bowel sounds. Musculoskeletal:  No active joints Skin:  No C/C/E     ASSESSMENT AND PLAN    Hypoxemic respiratory failure. MV on PRVC ~ 7lpm. Weaning complicated by sedation requirements.  Subacute ILD. Work up in progress. As myasthenic crisis ruled out, chronic aspiration less likely.    Myasthenia Gravis. Neurol note appreciated; not in myasthenic crisis and does not need plasma exchange; Imuran off.  Hx recurrent VTE and PE. Continue heparin  Anxiety and high sedation requirements.  Agitated delirium. Initiate enteral sedation to wean off infusions.  SUMMARY OF TODAY'S PLAN:   Continue ventilatory support.  Best Practice / Goals of Care / Disposition.   DVT PROPHYLAXIS: heparin drip NUTRITION:Pro Stat TF MOBILITY:BR GOALS OF CARE:Early extubation   LABS   Relevant labs and investigations personally reviewed.  Mild stable leukocytosis.  Mild hyponatremia.  Abdominal XR today: feeding tube in duodenum. + gastric ileus.  CXR shows progressive patchy interstitial and alveolar disease.  CRITICAL CARE Performed by: [name] Aphrodite Harpenau   Total critical care time: 40 minutes  Critical care time was exclusive of separately billable procedures and treating other patients.  Critical care was necessary to treat or prevent imminent or life-threatening deterioration.  Critical care was time spent personally by me on the following activities: development of treatment plan with patient and/or surrogate as well as nursing, discussions with consultants, evaluation of patient's response to treatment, examination of patient, obtaining history  from patient or surrogate, ordering and performing treatments and interventions, ordering and review of laboratory studies, ordering and review of radiographic studies, pulse oximetry and re-evaluation of patient's condition.   Lynnell Catalan,  MD [facility] ICU Physician [facility]  Pager: [phone] Mobile: [phone] After hours: [phone].

## 2018-02-16 NOTE — Care Management Note (Signed)
Case Management Note Previous Note Created by Tamala Fothergill  Patient Details  Name: Curtis Preston MRN: [id number] Date of Birth: 04-20-1951  Subjective/Objective:  67 year old male presented with worsening SOB and productive cough. PMH which is significant for Myasthenia gravis on imuran, COPD, PE on Xarelto, and CAD. Patient traveled  to [place] from [place] (where he lives) to visit his family. Patient is followed by [facility] [phone], unsure of his PCP's name; [facility]: Dr. Jani Files [phone]. Rx are obtained through [facility]. Patient indicated he lives at home with his fiancee, independent with ADLs PTA, and he occasionally utilizes a Programmer, multimedia.                  Action/Plan: Upon speaking to Dr. Nelda Marseille, patient wants to return back to [place], with assistance from CM requested. Patient is currently on HFO2 @ 8L, with transport via airplane not an option at this time d/t patient's current high O2 requirement. Non-emergent ambulance transport isn't covered by Medicare, with cost based on mileage, with payment often required upfront-oxygen cost required for transport would be separate from transport cost. CM met with patient to discuss transitional needs. Patient informed CM of his Veteran status, and suggested [facility] could possibly assist with providing ambulance transportation. Patient also indicated he could ask his brother (who lives locally), if he's agreeable to providing car transport to [place], and will update CM. Patient denied having home O2 PTA; CM can assist with the arrangement of home/portable O2 but would require a <O2 liter flow, qualifying O2 sats to qualify for a portable concentrator. CM will contact patient's [facility] providers, as requested, to inquire if transport arrangement is an option and will update patient/attending once determined.  CM will continue to follow.   Expected Discharge Date:   02/08/18               Expected Discharge Plan:  (TBD)  In-House Referral:  NA  Discharge planning Services  CM Consult, DC out of service area  Post Acute Care Choice:  NA Choice offered to:  NA  DME Arranged:  N/A DME Agency:  NA  HH Arranged:  NA HH Agency:  NA  Status of Service:  In process, will continue to follow  If discussed at Long Length of Stay Meetings, dates discussed:    Additional Comments 02/16/2018 Pt now on 68M and intubated.  CM contacted SW [place] and was told to reach out to the [place] transport department at [phone] ext [phone].  02/13/18 @ 1538-Natalie Gay RNCM-Call received from April, [facility] [place] transfer coordinator. [employer], SW for [facility] will be the point of contact to assist with VA benefit needs. [phone] ext. [phone]/pager: [phone].   02/12/18 @ 1454-Natalie Gay RNCM-CM spoke to the transfer coordinator for [facility] to inquire if transportation could be arranged under patient's VA benefits. CM was informed to contact the transport coordinator at the nearest [place] facility to review patient's transfer benefits, then initiate the transportation if coverage was determined. Patient [facility] PCP was verified as: Dr. Delynn Flavin @ [phone]; Case Manager: Cora Daniels @ [phone]: [phone]. CM spoke to April, transfer coordinator with [facility]; patient's information must be entered/loaded into [facility] system prior to verifying benefits. April will f/u with CM on 02/13/18 once his benefits are determined. CM updated patient and will continue to follow.   Midge Minium RN, BSN, NCM-BC, ACM-RN [phone] 02/16/2018, 12:05 PM

## 2018-02-17 DIAGNOSIS — J849 Interstitial pulmonary disease, unspecified: Secondary | ICD-10-CM

## 2018-02-17 DIAGNOSIS — J449 Chronic obstructive pulmonary disease, unspecified: Secondary | ICD-10-CM

## 2018-02-17 LAB — GLUCOSE, CAPILLARY
GLUCOSE-CAPILLARY: 113 mg/dL — AB (ref 70–99)
GLUCOSE-CAPILLARY: 116 mg/dL — AB (ref 70–99)
GLUCOSE-CAPILLARY: 67 mg/dL — AB (ref 70–99)
GLUCOSE-CAPILLARY: 75 mg/dL (ref 70–99)
GLUCOSE-CAPILLARY: 91 mg/dL (ref 70–99)
GLUCOSE-CAPILLARY: 93 mg/dL (ref 70–99)
Glucose-Capillary: 78 mg/dL (ref 70–99)

## 2018-02-17 LAB — CBC
HCT: 27.1 % — ABNORMAL LOW (ref 39.0–52.0)
HEMOGLOBIN: 8.5 g/dL — AB (ref 13.0–17.0)
MCH: 32 pg (ref 26.0–34.0)
MCHC: 31.4 g/dL (ref 30.0–36.0)
MCV: 101.9 fL — ABNORMAL HIGH (ref 78.0–100.0)
Platelets: 282 10*3/uL (ref 150–400)
RBC: 2.66 MIL/uL — AB (ref 4.22–5.81)
RDW: 14.2 % (ref 11.5–15.5)
WBC: 11.6 10*3/uL — ABNORMAL HIGH (ref 4.0–10.5)

## 2018-02-17 LAB — ACID FAST SMEAR (AFB, MYCOBACTERIA)

## 2018-02-17 LAB — BASIC METABOLIC PANEL
ANION GAP: 8 (ref 5–15)
BUN: 17 mg/dL (ref 8–23)
CALCIUM: 7.5 mg/dL — AB (ref 8.9–10.3)
CO2: 24 mmol/L (ref 22–32)
CREATININE: 0.95 mg/dL (ref 0.61–1.24)
Chloride: 99 mmol/L (ref 98–111)
GFR calc Af Amer: >60 mL/min (ref >60)
GFR calc non Af Amer: >60 mL/min (ref >60)
GLUCOSE: 131 mg/dL — AB (ref 70–99)
Potassium: 4.4 mmol/L (ref 3.5–5.1)
Sodium: 131 mmol/L — ABNORMAL LOW (ref 135–145)

## 2018-02-17 LAB — ACID FAST SMEAR (AFB): ACID FAST SMEAR - AFSCU2: NEGATIVE

## 2018-02-17 MED ORDER — FUROSEMIDE 10 MG/ML IJ SOLN
40.0000 mg | Freq: Once | INTRAMUSCULAR | Status: AC
  Administered 2018-02-17: 40 mg via INTRAVENOUS
  Filled 2018-02-17: qty 4

## 2018-02-17 MED ORDER — PANCRELIPASE (LIP-PROT-AMYL) 10440-39150 UNITS PO TABS
20880.0000 [IU] | ORAL_TABLET | Freq: Once | ORAL | Status: AC
  Administered 2018-02-17: 20880 [IU]
  Filled 2018-02-17: qty 2

## 2018-02-17 MED ORDER — PANCRELIPASE (LIP-PROT-AMYL) 10440-39150 UNITS PO TABS: 20880.0000 [IU] | ORAL_TABLET | Freq: Once | ORAL | Status: DC

## 2018-02-17 MED ORDER — SODIUM BICARBONATE 650 MG PO TABS: 650.0000 mg | ORAL_TABLET | Freq: Once | ORAL | Status: DC

## 2018-02-17 MED ORDER — DEXTROSE 5 % IV SOLN
INTRAVENOUS | Status: DC
  Administered 2018-02-17: 05:00:00 via INTRAVENOUS

## 2018-02-17 MED ORDER — SODIUM BICARBONATE 650 MG PO TABS
650.0000 mg | ORAL_TABLET | Freq: Once | ORAL | Status: AC
  Administered 2018-02-17: 650 mg
  Filled 2018-02-17: qty 1

## 2018-02-17 MED ORDER — DEXTROSE 50 % IV SOLN
25.0000 mL | Freq: Once | INTRAVENOUS | Status: AC
  Administered 2018-02-17: 25 mL via INTRAVENOUS
  Filled 2018-02-17: qty 50

## 2018-02-17 NOTE — Consult Note (Addendum)
[address]       [place] [address]             [phone]        Dorance Michael [facility]en [place] Medical Record #[id number] Date of Birth: 12/19/1950  Referring: CCM Primary Care: No primary care provider on file. Primary Cardiologist:No primary care provider on file.  Chief Complaint:    Chief Complaint  Patient presents with  . Shortness of Breath  . Chest Pain  . Cough  . Congestion    History of Present Illness:     Mr. Aman is a 67 year old male patient with a past medical history significant for myasthenia gravis, COPD, Anxiety/Depression, PE on Xarelto, coronary artery disease, tobacco abuse, GERD, hypothyroidism, acute respiratory failure who is currently intubated.  About a month ago in early August he developed worsening shortness of breath with associated productive cough.  This persisted for about 6 weeks until his symptoms became significantly worse after traveling from his home state of [place] to [place].  Eventually he presented to the emergency department.  Work-up in the ED suggested pneumonia and he was and started on empiric antibiotics.  CT scan of the chest was obtained on 02/08/2018 which showed extensive confluent groundglass attenuation throughout both lungs with consolidation more prevalent in the superior segment of the left lower lobe.  Findings are most concerning for multilobular pneumonia.  He also had extensive atelectasis and a consolidation in the superior segment in the left lower lobe. Bronchoscopy performed on 02/13/18 with no growth on gram stain and cultures remain without growth to date.  He was intubated on 9/15 for acute respiratory failure and hypoxia.  Subsequently he self extubated but was reintubated since he could not manage his secretions.     Current Activity/ Functional Status: Patient was independent with mobility/ambulation, transfers, ADL's, IADL's.   Zubrod Score: At the time of surgery this patient's most  appropriate activity status/level should be described as: '[]'     0    Normal activity, no symptoms '[]'     1    Restricted in physical strenuous activity but ambulatory, able to do out light work '[]'     2    Ambulatory and capable of self care, unable to do work activities, up and about                 more than 50%  Of the time                            '[]'     3    Only limited self care, in bed greater than 50% of waking hours '[x]'     4    Completely disabled, no self care, confined to bed or chair '[]'     5    Moribund   Past Medical History:  Diagnosis Date  . Bronchiectasis ([place])   . CAD (coronary artery disease)   . COPD (chronic obstructive pulmonary disease) ([place])   . Depression with anxiety   . GERD (gastroesophageal reflux disease)   . Hypothyroidism   . Myasthenia gravis ([place])   . PE (pulmonary thromboembolism) ([place])   . Sjogren's disease ([place])   . Tobacco abuse     Past Surgical History:  Procedure Laterality Date  . Right ulnar nerve impingement      Social History   Tobacco Use  Smoking Status Current Every Day Smoker  .  Packs/day: 0.50  . Types: Cigarettes  Smokeless Tobacco Never Used    Social History   Substance and Sexual Activity  Alcohol Use Yes   Comment: occasional     Allergies  Allergen Reactions  . Hydromorphone Other (See Comments)    hallucinations   . Immune Globulin Other (See Comments)     Bilateral pulmonary embolism      Current Facility-Administered Medications  Medication Dose Route Frequency Provider Last Rate Last Dose  . 0.9 %  sodium chloride infusion   Intravenous PRN Rush Farmer, MD 10 mL/hr at 02/17/18 0800    . atorvastatin (LIPITOR) tablet 40 mg  40 mg Per Tube QHS Erick Colace, NP   40 mg at 02/16/18 2210  . baclofen (LIORESAL) tablet 10 mg  10 mg Per Tube TID Erick Colace, NP   10 mg at 02/17/18 1050  . calcium-vitamin D (OSCAL WITH D) 500-200 MG-UNIT per tablet 1 tablet  1 tablet Per Tube Daily Erick Colace, NP   1 tablet at 02/17/18 1050  . chlorhexidine gluconate (MEDLINE KIT) (PERIDEX) 0.12 % solution 15 mL  15 mL Mouth Rinse BID Rush Farmer, MD   15 mL at 02/17/18 0805  . Chlorhexidine Gluconate Cloth 2 % PADS 6 each  6 each Topical Daily Cherene Altes, MD   6 each at 02/17/18 0230  . clonazePAM (KLONOPIN) tablet 1 mg  1 mg Per Tube BID Kipp Brood, MD   1 mg at 02/17/18 0908  . dextrose 5 % solution   Intravenous Continuous Anders Simmonds, MD 40 mL/hr at 02/17/18 0800    . famotidine (PEPCID) IVPB 20 mg premix  20 mg Intravenous Q12H Erick Colace, NP 100 mL/hr at 02/17/18 1137 20 mg at 02/17/18 1137  . feeding supplement (PRO-STAT SUGAR FREE 64) liquid 30 mL  30 mL Per Tube Daily Rush Farmer, MD   30 mL at 02/17/18 1051  . feeding supplement (VITAL AF 1.2 CAL) liquid 1,500 mL  1,500 mL Per Tube Continuous Rush Farmer, MD   Stopped at 02/15/18 0454  . fentaNYL (SUBLIMAZE) bolus via infusion 25 mcg  25 mcg Intravenous Q1H PRN Rush Farmer, MD   25 mcg at 02/16/18 1015  . fentaNYL 2571mg in NS 2548m(1063mml) infusion-PREMIX  25-400 mcg/hr Intravenous Continuous YacRush FarmerD 27.5 mL/hr at 02/17/18 0800 275 mcg/hr at 02/17/18 0800  . furosemide (LASIX) injection 40 mg  40 mg Intravenous Once AgaKipp BroodD      . imipenem-cilastatin (PRIMAXIN) 750 mg in sodium chloride 0.9 % 100 mL IVPB  750 mg Intravenous Q8H CalGolden CircleNP 200 mL/hr at 02/17/18 1318 750 mg at 02/17/18 1318  . Influenza vac split quadrivalent PF (FLUZONE HIGH-DOSE) injection 0.5 mL  0.5 mL Intramuscular Tomorrow-1000 BabSalvadore Dom NP      . ipratropium-albuterol (DUONEB) 0.5-2.5 (3) MG/3ML nebulizer solution 3 mL  3 mL Nebulization Q6H BabErick ColaceP   3 mL at 02/17/18 0814  . levothyroxine (SYNTHROID, LEVOTHROID) tablet 175 mcg  175 mcg Per Tube QAC breakfast BabErick ColaceP   175 mcg at 02/17/18 [id number] MEDLINE mouth rinse  15 mL Mouth Rinse 10 times per day  YacRush FarmerD   15 mL at 02/17/18 1219  . midazolam (VERSED) injection 2 mg  2 mg Intravenous Q2H PRN Deterding, EliGuadelupe SabinD   2 mg at 02/16/18 1535  .  mirtazapine (REMERON SOL-TAB) disintegrating tablet 15 mg  15 mg Oral QHS Kipp Brood, MD   15 mg at 02/16/18 2210  . montelukast (SINGULAIR) tablet 10 mg  10 mg Oral QHS Ivor Costa, MD   10 mg at 02/16/18 2210  . morphine CONCENTRATE 10 MG/0.5ML oral solution 10 mg  10 mg Oral Q6H Agarwala, Ravi, MD   10 mg at 02/17/18 1317  . norepinephrine (LEVOPHED) 6m in D5W 257mpremix infusion  0-40 mcg/min Intravenous Titrated YaRush FarmerMD   Stopped at 02/14/18 0554  . polyethylene glycol (MIRALAX / GLYCOLAX) packet 17 g  17 g Oral BID PRN McJuanito DoomMD   17 g at 02/16/18 1629  . pregabalin (LYRICA) capsule 50 mg  50 mg Per Tube QHS BaErick ColaceNP   50 mg at 02/16/18 2150  . propofol (DIPRIVAN) 1000 MG/100ML infusion  0-50 mcg/kg/min Intravenous Continuous EuOmar PersonNP 11.21 mL/hr at 02/17/18 1215 25 mcg/kg/min at 02/17/18 1215  . pyridostigmine (MESTINON) tablet 30 mg  30 mg Per Tube QID BaErick ColaceNP   30 mg at 02/17/18 1051  . QUEtiapine (SEROQUEL) tablet 50 mg  50 mg Oral BID AgKipp BroodMD   50 mg at 02/17/18 1050  . rivaroxaban (XARELTO) tablet 20 mg  20 mg Per Tube Q supper BaErick ColaceNP   20 mg at 02/16/18 1629  . senna-docusate (Senokot-S) tablet 1 tablet  1 tablet Per Tube BID OlNoe Gens, NP   1 tablet at 02/17/18 1053  . sodium chloride flush (NS) 0.9 % injection 10-40 mL  10-40 mL Intracatheter Q12H YaRush FarmerMD   10 mL at 02/16/18 2226  . sodium chloride flush (NS) 0.9 % injection 10-40 mL  10-40 mL Intracatheter PRN YaRush FarmerMD   10 mL at 02/13/18 1907  . sulfamethoxazole-trimethoprim (BACTRIM) 370 mg in dextrose 5 % 500 mL IVPB  370 mg Intravenous Q8H CaGolden CircleFNP 348.8 mL/hr at 02/17/18 1309 370 mg at 02/17/18 1309  . vitamin B-12 (CYANOCOBALAMIN)  tablet 1,000 mcg  1,000 mcg Per Tube Daily BaErick ColaceNP   1,000 mcg at 02/17/18 1050    Medications Prior to Admission  Medication Sig Dispense Refill Last Dose  . aspirin (ASPIRIN 81) 81 MG EC tablet Take 81 mg by mouth daily.   02/06/2018 at 1300  . atorvastatin (LIPITOR) 40 MG tablet Take 40 mg by mouth at bedtime.   02/05/2018 at Unknown time  . azathioprine (IMURAN) 100 MG tablet Take 200 mg by mouth at bedtime.   02/05/2018 at Unknown time  . azelastine (ASTELIN) 0.1 % nasal spray Place 2 sprays into the nose 2 (two) times daily.   02/06/2018 at Unknown time  . baclofen (LIORESAL) 10 MG tablet Take 10 mg by mouth 3 (three) times daily.  3 02/06/2018 at Unknown time  . benzonatate (TESSALON) 200 MG capsule Take 1 capsule by mouth as needed for cough.   unk  . buPROPion (WELLBUTRIN SR) 150 MG 12 hr tablet Take 150 mg by mouth 2 (two) times daily.   02/06/2018 at Unknown time  . Calcium Carb-Ergocalciferol 500-200 MG-UNIT TABS Take 1 tablet by mouth daily.   02/06/2018 at Unknown time  . fluticasone (FLONASE) 50 MCG/ACT nasal spray Place 1 spray into the nose 2 (two) times daily.   02/06/2018 at Unknown time  . guaiFENesin-dextromethorphan (ROBITUSSIN DM) 100-10 MG/5ML syrup Take 10 mLs by mouth 2 (two)  times daily as needed for cough.    unk  . HYDROcodone-acetaminophen (NORCO/VICODIN) 5-325 MG tablet Take 1 tablet by mouth 3 (three) times daily as needed.  0 Past Week at Unknown time  . ipratropium (ATROVENT) 0.03 % nasal spray Place 1 spray into both nostrils every 12 (twelve) hours.   02/06/2018 at Unknown time  . ipratropium-albuterol (DUONEB) 0.5-2.5 (3) MG/3ML SOLN Inhale 3 mLs into the lungs every 6 (six) hours.   02/06/2018 at Unknown time  . levothyroxine (SYNTHROID, LEVOTHROID) 175 MCG tablet Take 175 mcg by mouth daily before breakfast.   02/06/2018 at Unknown time  . mirtazapine (REMERON) 15 MG tablet Take 30 mg by mouth at bedtime.   02/05/2018 at Unknown time  . montelukast (SINGULAIR) 10 MG  tablet Take 10 mg by mouth at bedtime.   02/05/2018 at Unknown time  . morphine (MS CONTIN) 15 MG 12 hr tablet Take 15 mg by mouth at bedtime.  0 Past Week at Unknown time  . Multiple Vitamins-Minerals (ICAPS AREDS 2 PO) Take 1 tablet by mouth 2 (two) times daily.   02/06/2018 at Unknown time  . omeprazole (PRILOSEC) 40 MG capsule Take 40 mg by mouth 2 (two) times daily.   02/06/2018 at Unknown time  . ondansetron (ZOFRAN) 4 MG tablet Take 4 mg by mouth every 6 (six) hours as needed for nausea or vomiting.    unk  . pregabalin (LYRICA) 50 MG capsule Take 50 mg by mouth at bedtime.   02/05/2018 at Unknown time  . pyridostigmine (MESTINON) 60 MG tablet Take 60 mg by mouth 4 (four) times daily.    02/06/2018 at Unknown time  . rivaroxaban (XARELTO) 20 MG TABS tablet Take 20 mg by mouth daily.   02/06/2018 at 1300  . Tiotropium Bromide-Olodaterol (STIOLTO RESPIMAT) 2.5-2.5 MCG/ACT AERS Inhale 2 puffs into the lungs daily.   02/06/2018 at Unknown time  . venlafaxine (EFFEXOR) 75 MG tablet Take 225 mg by mouth daily.   02/06/2018 at 1300  . vitamin B-12 (CYANOCOBALAMIN) 1000 MCG tablet Take 1,000 mcg by mouth daily.   02/06/2018 at Unknown time    Family History  Problem Relation Age of Onset  . Heart attack Father      Review of Systems:   Review of Systems  Constitutional: Positive for fever and malaise/fatigue.  Respiratory: Positive for cough, sputum production and shortness of breath.   Cardiovascular: Positive for leg swelling.  Gastrointestinal: Negative.   Psychiatric/Behavioral: Positive for depression. The patient is nervous/anxious.    Pertinent items are noted in HPI.    (ROS limited due to patient being intubated/sedated)   Physical Exam: BP (!) 104/50   Pulse 76   Temp 98.8 F (37.1 C) (Oral)   Resp 14   Ht '6\' 1"'  (1.854 m)   Wt 81.7 kg   SpO2 93%   BMI 23.76 kg/m    General appearance: no distress and intubated/sedated Resp: rhonchi in all fields Cardio: regular rate and rhythm,  S1, S2 normal, no murmur, click, rub or gallop GI: soft, non-tender; bowel sounds normal; no masses,  no organomegaly Extremities: 1-2+ pitting pedal edema, + distal pulses Neurologic: intubated/sedated  Diagnostic Studies & Laboratory data:    CLINICAL DATA:  Progressive hypoxemia.  Pneumonia.  EXAM: CT CHEST WITHOUT CONTRAST  TECHNIQUE: Multidetector CT imaging of the chest was performed following the standard protocol without IV contrast.  COMPARISON:  Chest x-ray 02/08/2018  FINDINGS: Cardiovascular: Heart size is normal. Atherosclerotic calcifications are present coronary  arteries. Atherosclerotic changes are present at the aortic arch. Pulmonary arteries mildly prominent bilaterally. No significant pericardial effusion is present.  Mediastinum/Nodes: No significant mediastinal, axillary, or hilar adenopathy is present. The esophagus is within normal limits. The thoracic inlet is unremarkable.  Lungs/Pleura: Extensive confluent ground-glass attenuation is present throughout the lower lobes, the lingula, and right middle lobe. Consolidation is most evident in the superior segment of the left lower lobe. Extensive centrilobular emphysematous changes are present. No significant pleural effusion is present.  Upper Abdomen: Unremarkable.  Musculoskeletal: A remote superior endplate fractures present at T7. Vertebral body heights alignment are otherwise maintained. No focal lytic or blastic lesions are present. Ribs are within normal limits.  IMPRESSION: 1. Extensive confluent ground-glass attenuation throughout both lungs with consolidation more prevalent in the superior segment of the left lower lobe. The findings are nonspecific, but most concerning for multi lobar pneumonia. Edema or possibly could have a similar appearance. This is to extensive for atelectasis. Consolidation in the superior segment in the left lower lobe suggests infection. 2.  Aortic  Atherosclerosis (ICD10-I70.0). 3.  Emphysema (ICD10-J43.9). 4. Coronary artery disease   Electronically Signed   By: San Morelle M.D.   On: 02/08/2018 11:47    Recent Radiology Findings:   Dg Chest Port 1 View  Result Date: 02/16/2018 CLINICAL DATA:  Acute respiratory failure with hypoxia EXAM: PORTABLE CHEST 1 VIEW COMPARISON:  Yesterday FINDINGS: Endotracheal tube tip between the clavicular heads and carina. An orogastric tube at least reaches the pylorus. Bilateral central line with tip at the SVC. Unchanged asymmetric airspace disease, presumed infection in this immunocompromised patient. IMPRESSION: 1. Stable hardware positioning. The orogastric tube may intubate the duodenum. 2. Unchanged airspace disease. Electronically Signed   By: Monte Fantasia M.D.   On: 02/16/2018 07:12   Dg Chest Port 1 View  Result Date: 02/15/2018 CLINICAL DATA:  Intubation. History of COPD. EXAM: PORTABLE CHEST 1 VIEW COMPARISON:  02/14/2018 FINDINGS: Endotracheal tube with tip measuring 7 cm above the carina. Enteric tube tip is off the field of view but below the left hemidiaphragm. Bilateral central venous catheters with tips over the distal SVC region. No pneumothorax. Heart size is normal. There is patchy airspace disease throughout both lungs, demonstrating progression since previous study. No blunting of costophrenic angles. IMPRESSION: Appliances appear to be in satisfactory position. Diffuse bilateral airspace disease in the lungs, demonstrating progression since previous study. Electronically Signed   By: Lucienne Capers M.D.   On: 02/15/2018 22:25   Dg Abd Portable 1v  Result Date: 02/16/2018 CLINICAL DATA:  Check feeding catheter placement EXAM: PORTABLE ABDOMEN - 1 VIEW COMPARISON:  02/15/2018 FINDINGS: Nasogastric catheter has been removed. A feeding catheter is now seen and appears to extend beyond the pylorus into the first portion of the duodenum. Persistent cecal prominence is  noted. No other focal abnormality is seen. IMPRESSION: Feeding catheter which appears to lie within the first portion of the duodenum. Electronically Signed   By: Inez Catalina M.D.   On: 02/16/2018 16:48       Recent Lab Findings: Lab Results  Component Value Date   WBC 11.6 (H) 02/17/2018   HGB 8.5 (L) 02/17/2018   HCT 27.1 (L) 02/17/2018   PLT 282 02/17/2018   GLUCOSE 131 (H) 02/17/2018   TRIG 115 02/15/2018   ALT 29 02/10/2018   AST 36 02/10/2018   NA 131 (L) 02/17/2018   K 4.4 02/17/2018   CL 99 02/17/2018   CREATININE 0.95 02/17/2018  BUN 17 02/17/2018   CO2 24 02/17/2018      Assessment / Plan:      1. Acute hypoxic respiratory failure-intubated and sedated. CT scan listed above. Continue diuresis for improved lung mechanics. 2. Subacute interstitial lung disease-We are consulted for diagnostic lung biopsy.  3. 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 Myasthenia Gravis- Not in crisis. Medical management per CC 5. Multi-lobular pneumonia-Continue current antibiotic regimen. ID following and assisting. Bronch cultures remain negative to date.  6. Anxiety/Delirum-requiring high level of sedation with propofol and fentanyl gtt 7. Nutrition-continue tube feedings   Plan: Lung biopsy currently on Xarelto  would carry significant surgical  risk with low yield of changing treatment plan from Bronchoscopy/ work up.   Grace Isaac MD      [address] [place] [address] Office [phone]   [place]

## 2018-02-17 NOTE — Progress Notes (Signed)
eLink Physician-Brief Progress Note Patient Name: Curtis Preston DOB: 12/18/1950 MRN: [id number]   Date of Service  02/17/2018  HPI/Events of Note  Hypoglycemia - Blood glucose = 67.   eICU Interventions  Will order: 1. D5W to run IV at 40 mL/hour.      Intervention Category Major Interventions: Other:  Curtis Preston 02/17/2018, 4:08 AM

## 2018-02-17 NOTE — Care Management Note (Addendum)
Case Management Note Previous Note Created by Tamala Fothergill  Patient Details  Name: Curtis Preston MRN: [id number] Date of Birth: January 08, 1951  Subjective/Objective:  67 year old male presented with worsening SOB and productive cough. PMH which is significant for Myasthenia gravis on imuran, COPD, PE on Xarelto, and CAD. Patient traveled  to [place] from [place] (where he lives) to visit his family. Patient is followed by [facility] [phone], unsure of his PCP's name; [facility]: Dr. Jani Files [phone]. Rx are obtained through [facility]. Patient indicated he lives at home with his fiancee, independent with ADLs PTA, and he occasionally utilizes a Programmer, multimedia.                  Action/Plan: Upon speaking to Dr. Nelda Marseille, patient wants to return back to [place], with assistance from CM requested. Patient is currently on HFO2 @ 8L, with transport via airplane not an option at this time d/t patient's current high O2 requirement. Non-emergent ambulance transport isn't covered by Medicare, with cost based on mileage, with payment often required upfront-oxygen cost required for transport would be separate from transport cost. CM met with patient to discuss transitional needs. Patient informed CM of his Veteran status, and suggested [facility] could possibly assist with providing ambulance transportation. Patient also indicated he could ask his brother (who lives locally), if he's agreeable to providing car transport to [place], and will update CM. Patient denied having home O2 PTA; CM can assist with the arrangement of home/portable O2 but would require a <O2 liter flow, qualifying O2 sats to qualify for a portable concentrator. CM will contact patient's [place] providers, as requested, to inquire if transport arrangement is an option and will update patient/attending once determined.  CM will continue to follow.   Expected Discharge Date:   02/08/18               Expected Discharge Plan:  (TBD)  In-House Referral:  NA  Discharge planning Services  CM Consult, DC out of service area  Post Acute Care Choice:  NA Choice offered to:  NA  DME Arranged:  N/A DME Agency:  NA  HH Arranged:  NA HH Agency:  NA  Status of Service:  In process, will continue to follow  If discussed at Long Length of Stay Meetings, dates discussed:    Additional Comments 02/17/2018  CM received call from [place] provided verbal update of pts medical status- VA not requesting hardcopy documentation at this time other than refusal of transfer form.  CM discussed/explained form with fiance Karle Plumber via phone - fiance refused transfer to local [place] and informed CM that she wants pt to remain at [facility] (pt is currently intubated and sedated).  CM AC provided second verification to discussion and refusal for transfer, CM also signed the form as second verification.  Refusal to Transfer Form faxed to [facility] as requested.   Finance informed CM that if pt is able to ride in private vehicle at discharge she will come and pick pt up  Pt remains intubated- per attending may require a trach at some point.  CM contacted VA transfer and was informed pt will not be able receive transportation from the [place] to [place] unless; he is already in a [place] facility and has left than 6 months to live.  CM questioned if the [place] prefers a transfer to a local facility with ICU capability as pt remains  critically ill at this point, VA will support the decision of the attending at [facility] - however pt can remain at [facility] if appropriate and will need family/friend to sign consent prior to transfer.  CM discussed case with attending - at this time pt is to critical for transfer, attending will readdress 9/18.  CM reached out to pts wife to discuss - pt informed CM that she wants the pt to remain here at [facility] and not transfer to [place] informed.    02/17/18 Pt now on  34M and intubated.  CM contacted [facility] [place] and was told to reach out to the [place] transport department at [phone] ext [phone].  02/13/18 @ 1538-Natalie Gay RNCM-Call received from April, [place] transfer coordinator. [employer], SW for [facility] will be the point of contact to assist with VA benefit needs. [phone] ext. [phone]/pager: [phone].   02/12/18 @ 1454-Natalie Gay RNCM-CM spoke to the transfer coordinator for [place] to inquire if transportation could be arranged under patient's VA benefits. CM was informed to contact the transport coordinator at the nearest [place] facility to review patient's transfer benefits, then initiate the transportation if coverage was determined. Patient [place] PCP was verified as: Dr. Delynn Flavin @ [phone]; Case Manager: Cora Daniels @ [phone]: [phone]. CM spoke to April, transfer coordinator with [facility]; patient's information must be entered/loaded into [facility] system prior to verifying benefits. April will f/u with CM on 02/13/18 once his benefits are determined. CM updated patient and will continue to follow.   Midge Minium RN, BSN, NCM-BC, ACM-RN [phone] 02/17/2018, 1:02 PM

## 2018-02-17 NOTE — Progress Notes (Signed)
Telephone call to Nurse requesting a third AFB specimen to adequately R/O TB. Per our TB management plan, three consecutive negative sputum smears for AFB collected 8-24 hours intervals and at least one specimen is an early morning specimen.

## 2018-02-17 NOTE — Progress Notes (Signed)
PULMONARY / CRITICAL CARE MEDICINE   NAME:  Curtis Preston, MRN:  [id number], DOB:  [birth year], LOS: 9 ADMISSION DATE:  02/06/2018, CONSULTATION DATE:  02/09/2018 REFERRING MD:  Waymon Amato, CHIEF COMPLAINT:  PNA in an immunocompromised patient  BRIEF HISTORY:    67 year old male with PMH as below, which is significant for Myasthenia gravis on imuran, COPD, PE on Xarelto, and CAD. He was in his usual state of health (gets winded daily) until about early August of this year, when he developed worsening shortness of breath with associated productive cough (green sputum). This persisted for about 6 weeks, until about 9/6, when his symptoms significant worsened after traveling here to [place] from [place] (where he lives). SOB worsened as did his productive cough, which caused him to present to ED. Workup in ED suggested pneumonia and he was started on empiric antibiotics. CT demonstrated bilateral ground glass, and most concerning for multi-focal pneumonia. Infectious disease consultation sees benefit in bronchoscopy for culture. PCCM consulted and recommended transfer to the intensive care unit for monitoring of respiratory status.  SIGNIFICANT EVENTS:   Re-intubated for hypoxia 9/15.  Subsequent self-extubation, but not able to manage secretions, so intubated again.   SUBJECTIVE:  Indicating no specific c/o when aroused. Difficult sedation.  CONSTITUTIONAL: BP (!) 114/49   Pulse 71   Temp (!) 100.5 F (38.1 C) (Oral)   Resp 20   Ht 6\' 1"  (1.854 m)   Wt 81.7 kg   SpO2 97%   BMI 23.76 kg/m   I/O last 3 completed shifts: In: 8355.1 [I.V.:2680.5; NG/GT:120; IV Piggyback:5554.6] Out: 2900 [Urine:2900]     Vent Mode: PRVC FiO2 (%):  [40 %] 40 % Set Rate:  [16 bmp] 16 bmp Vt Set:  [640 mL-650 mL] 640 mL PEEP:  [5 cmH20-8 cmH20] 5 cmH20 Plateau Pressure:  [20 cmH20-27 cmH20] 27 cmH20  PHYSICAL EXAM: General:  WD/Wn NAD Neuro:  No focal findings - sedated but easily arousable. HEENT:    ETT/OGT in place with no skin breakdown. Cardiovascular:  HS normal with warm extremities Lungs: Coarse crackles posteriorly Abdomen: Distended with rare bowel sounds. Musculoskeletal:  No active joints Skin:  Line sites intact, no rashes.      ASSESSMENT AND PLAN    Hypoxemic respiratory failure. MV on PRVC ~ 7lpm. Weaning complicated by sedation requirements.  Improving lung mechanics.  Wean sedation and start SBT.  Will diurese to further improve lung mechanics.  Subacute ILD. Work up in progress. As myasthenic crisis ruled out, so chronic aspiration less likely. CVTS called for possible lung biopsy.  Myasthenia Gravis. Neurol note appreciated; not in myasthenic crisis and does not need plasma exchange; Imuran off.  Hx recurrent VTE and PE. Continue heparin  Anxiety and high sedation requirements.  Agitated delirium. Initiate enteral sedation to wean off infusions.  SUMMARY OF TODAY'S PLAN:   Continue ventilatory support. Will work towards extubation.   Best Practice / Goals of Care / Disposition.   DVT PROPHYLAXIS: heparin drip NUTRITION:Pro Stat TF MOBILITY:BR GOALS OF CARE:  Early extubation   LABS   Relevant labs and investigations personally reviewed.  Mild stable leukocytosis.  Mild hyponatremia - likely due to mild volume overload.   Abdominal XR today: feeding tube in duodenum. + gastric ileus.  CXR shows progressive patchy interstitial and alveolar disease.  CRITICAL CARE Performed by: Lynnell Catalan   Total critical care time: 40 minutes  Critical care time was exclusive of separately billable procedures and treating other patients.  Critical care was necessary to treat or prevent imminent or life-threatening deterioration.  Critical care was time spent personally by me on the following activities: development of treatment plan with patient and/or surrogate as well as nursing, discussions with consultants, evaluation of patient's response to treatment,  examination of patient, obtaining history from patient or surrogate, ordering and performing treatments and interventions, ordering and review of laboratory studies, ordering and review of radiographic studies, pulse oximetry and re-evaluation of patient's condition.   [name], MD [facility] ICU Physician [facility]  Pager: [phone] Mobile: [phone] After hours: [phone].

## 2018-02-17 NOTE — Progress Notes (Signed)
Hypoglycemic Event  CBG: 67  Treatment: D50 IV 25 mL  Symptoms: None  Follow-up CBG: Time:0444 CBG Result:78  Possible Reasons for Event: Other: Tube Feed stopped and no fluids added  Comments/MD notified:elink    Curtis DupreJennifer  Curtis Preston

## 2018-02-18 ENCOUNTER — Inpatient Hospital Stay (HOSPITAL_COMMUNITY): Payer: Medicare HMO

## 2018-02-18 LAB — GLUCOSE, CAPILLARY
GLUCOSE-CAPILLARY: 99 mg/dL (ref 70–99)
GLUCOSE-CAPILLARY: 100 mg/dL — AB (ref 70–99)
GLUCOSE-CAPILLARY: 123 mg/dL — AB (ref 70–99)
Glucose-Capillary: 107 mg/dL — ABNORMAL HIGH (ref 70–99)
Glucose-Capillary: 132 mg/dL — ABNORMAL HIGH (ref 70–99)
Glucose-Capillary: 92 mg/dL (ref 70–99)
Glucose-Capillary: 98 mg/dL (ref 70–99)

## 2018-02-18 LAB — CULTURE, BLOOD (ROUTINE X 2)
CULTURE: NO GROWTH
CULTURE: NO GROWTH
Special Requests: ADEQUATE
Special Requests: ADEQUATE

## 2018-02-18 LAB — BASIC METABOLIC PANEL
ANION GAP: 11 (ref 5–15)
BUN: 8 mg/dL (ref 8–23)
CALCIUM: 7.7 mg/dL — AB (ref 8.9–10.3)
CO2: 27 mmol/L (ref 22–32)
Chloride: 96 mmol/L — ABNORMAL LOW (ref 98–111)
Creatinine, Ser: 0.82 mg/dL (ref 0.61–1.24)
GFR calc Af Amer: >60 mL/min (ref >60)
GLUCOSE: 140 mg/dL — AB (ref 70–99)
Potassium: 4.4 mmol/L (ref 3.5–5.1)
Sodium: 134 mmol/L — ABNORMAL LOW (ref 135–145)

## 2018-02-18 LAB — ACID FAST SMEAR (AFB): ACID FAST SMEAR - AFSCU2: NEGATIVE

## 2018-02-18 MED ORDER — MIDAZOLAM BOLUS VIA INFUSION
1.0000 mg | INTRAVENOUS | Status: DC | PRN
  Administered 2018-02-18 – 2018-02-19 (×2): 2 mg via INTRAVENOUS
  Administered 2018-02-20: 1 mg via INTRAVENOUS
  Filled 2018-02-18: qty 2

## 2018-02-18 MED ORDER — MIDAZOLAM HCL 2 MG/2ML IJ SOLN: 6.0000 mg | Freq: Once | INTRAMUSCULAR | Status: DC | PRN

## 2018-02-18 MED ORDER — SODIUM CHLORIDE 0.9 % IV SOLN
0.0000 mg/h | INTRAVENOUS | Status: DC
  Administered 2018-02-18: 4 mg/h via INTRAVENOUS
  Administered 2018-02-18 – 2018-02-22 (×4): 2 mg/h via INTRAVENOUS
  Filled 2018-02-18 (×5): qty 10

## 2018-02-18 NOTE — Progress Notes (Signed)
Autoimmune - Serum: ESR, ACE, ANA, DS-DNA, RF, anti-CCP, ssA, ssB, scl-70, ANCA screen, MPO, PR-3, Total CK,  RNP, Aldolase,  GBM,  Hypersensitivity Pneumonitis Panel  Consider for order  Also consider - CT chest wo cotnrast, and repea BAL with +/-  cell count/cultures/ MTB NAAI

## 2018-02-18 NOTE — Progress Notes (Signed)
Nutrition Follow-up  DOCUMENTATION CODES:   Non-severe (moderate) malnutrition in context of chronic illness  INTERVENTION:   Resume TF via post-pyloric Cortrak tube:  Vital AF 1.2 at 60 ml/h  Pro-stat 30 ml once daily  Total intake: 1828 kcal (2179 kcal total with current Propofol rate), 123 gm protein, 1168 ml free water daily   NUTRITION DIAGNOSIS:   Moderate Malnutrition related to chronic illness(MG, COPD) as evidenced by moderate fat depletion, moderate muscle depletion.  Ongoing  GOAL:   Patient will meet greater than or equal to 90% of their needs  Being addressed with TF  MONITOR:   TF tolerance, Vent status  ASSESSMENT:   Pt with PMH of myasthenia gravis on imuran, COPD, PE on Xarelto, and CAD. Pt was admitted 9/6 after 6 weeks of productive cough now tx to ICU 9/13 with acute hypoxemic respiratory failure secondary to CAP in an immunosuppressed pt with possible atypical organism.   Patient remains intubated on ventilator support MV: 15 L/min Temp (24hrs), Avg:99.1 F (37.3 C), Min:98.1 F (36.7 C), Max:100.5 F (38.1 C)  Propofol: 13.3 ml/hr providing 351 kcal from lipid  Current TF order: Vital AF 1.2 via OGT at 60 ml/h (1440 ml/day) with Prostat 30 ml once daily to provide 1828 kcals, 123 gm protein, 1168 ml free water daily.  Total intake 2179 kcal with propofol + TF  Cortrak was clogged last night, RN was unable to give medications. Cortrak team repositioned tube and it is now flushable. Plans to resume TF today per discussion with CCM physician.   Labs reviewed. Sodium 134 (L) CBG's: 132-99-100 Medications reviewed and include Oscal with D, Remeron, Mestinon, vitamin B-12, propofol. IVF: NS at 10 ml/h   Diet Order:   Diet Order            Diet NPO time specified  Diet effective now              EDUCATION NEEDS:   No education needs have been identified at this time  Skin:  Skin Assessment: Reviewed RN Assessment  Last BM:   9/9  Height:   Ht Readings from Last 1 Encounters:  02/09/18 6\' 1"  (1.854 m)    Weight:   Wt Readings from Last 1 Encounters:  02/18/18 80.8 kg    Ideal Body Weight:  83.6 kg  BMI:  Body mass index is 23.5 kg/m.  Estimated Nutritional Needs:   Kcal:  2180  Protein:  110-132 grams  Fluid:  > 1.8 L/day    Joaquin CourtsKimberly Harris, RD, LDN, CNSC Pager [phone] After Hours Pager [phone]

## 2018-02-18 NOTE — Progress Notes (Signed)
PULMONARY / CRITICAL CARE MEDICINE   NAME:  Curtis Preston, MRN:  [id number], DOB:  Mar 20, 1951, LOS: 64 ADMISSION DATE:  02/06/2018, CONSULTATION DATE:  02/09/2018 REFERRING MD:  Algis Liming, CHIEF COMPLAINT:  PNA in an immunocompromised patient  BRIEF HISTORY:    67 year old male with PMH as below, which is significant for Myasthenia gravis on imuran, COPD, PE on Xarelto, and CAD. He was in his usual state of health (gets winded daily) until about early August of this year, when he developed worsening shortness of breath with associated productive cough (green sputum). This persisted for about 6 weeks, until about 9/6, when his symptoms significant worsened after traveling here to [place] from [place] (where he lives). SOB worsened as did his productive cough, which caused him to present to ED. Workup in ED suggested pneumonia and he was started on empiric antibiotics. CT demonstrated bilateral ground glass, and most concerning for multi-focal pneumonia. Infectious disease consultation sees benefit in bronchoscopy for culture. PCCM consulted and recommended transfer to the intensive care unit for monitoring of respiratory status.  Re-intubated for hypoxia 9/15.  Subsequent self-extubation, but not able to manage secretions, so intubated again.   SIGNIFICANT EVENTS:   Remains intubated with periods of agitation. Tolerating PSV.  SUBJECTIVE:  Indicating no specific c/o when aroused. Difficult sedation.  CONSTITUTIONAL: BP (!) 145/72   Pulse (!) 101   Temp 98.1 F (36.7 C) (Oral)   Resp 16   Ht _0  (1.854 m)   Wt 80.8 kg   SpO2 99%   BMI 23.50 kg/m   I/O last 3 completed shifts: In: 8056.5 [I.V.:2843.6; IV Piggyback:5212.9] Out: 6875 [Urine:6875]     Vent Mode: PSV;CPAP FiO2 (%):  [40 %] 40 % Set Rate:  [16 bmp] 16 bmp Vt Set:  [640 mL] 640 mL PEEP:  [5 cmH20] 5 cmH20 Pressure Support:  [5 cmH20] 5 cmH20 Plateau Pressure:  [12 cmH20-26 cmH20] 15 cmH20  PHYSICAL  EXAM: General:  WD/Wn NAD Neuro:  No focal findings - sedated but easily arousable. HEENT:   ETT/OGT in place with no skin breakdown. Cardiovascular:  HS normal with warm extremities Lungs: Coarse crackles posteriorly Abdomen: Distended with rare bowel sounds. Musculoskeletal:  No active joints Skin:  Line sites intact, no rashes.      ASSESSMENT AND PLAN    Hypoxemic respiratory failure. MV on PRVC ~ 7lpm. Weaning complicated by sedation requirements.  Improving lung mechanics.  Wean sedation and start SBT.  Will diurese to further improve lung mechanics.  Subacute ILD. Work up in progress. As myasthenic crisis ruled out, so chronic aspiration less likely. CVTS has declined lung biopsy.  Repeat CT chest and BAL as part of autoimmune work up. Smear negative x2 - off isolation.  Myasthenia Gravis. Neurol note appreciated; not in myasthenic crisis and does not need plasma exchange; Imuran off.  Hx recurrent VTE and PE. Xarelto  Anxiety and high sedation requirements.  Agitated delirium. Initiate enteral sedation to wean off infusions.  SUMMARY OF TODAY'S PLAN:   Continue ventilatory support. Will work towards extubation.   Best Practice / Goals of Care / Disposition.   DVT PROPHYLAXIS: Xarelto. NUTRITION: resume feed via post-pyloric tube. MOBILITY: bedrest. GOALS OF CARE:  Early extubation   LABS   Relevant labs and investigations personally reviewed.  Mild stable leukocytosis.  Mild hyponatremia - likely due to mild volume overload.   Abdominal XR today: feeding tube in duodenum. .  CXR shows progressive patchy interstitial and alveolar disease.  All respiratory cultures are negative so far with no organisms seen. AFB from deep tracheal aspirate is negative x2   CRITICAL CARE Performed by: Kipp Brood   Total critical care time: 40 minutes  Critical care time was exclusive of separately billable procedures and treating other patients.  Critical care was  necessary to treat or prevent imminent or life-threatening deterioration.  Critical care was time spent personally by me on the following activities: development of treatment plan with patient and/or surrogate as well as nursing, discussions with consultants, evaluation of patient's response to treatment, examination of patient, obtaining history from patient or surrogate, ordering and performing treatments and interventions, ordering and review of laboratory studies, ordering and review of radiographic studies, pulse oximetry and re-evaluation of patient's condition.   Kipp Brood, MD [facility] ICU Physician [place]  Pager: [phone] Mobile: [phone] After hours: [phone].

## 2018-02-18 NOTE — Progress Notes (Signed)
Patient transported to CT and back to 2M10 without complications. RN and transport at bedside.

## 2018-02-18 NOTE — Progress Notes (Signed)
Cortrak Tube Team Note:  Consult received to re-place a Cortrak feeding tube. Per RN, cortrak tube clogged, unable to flush.   Pulled back on Cortrak tube, after multiple manipulations tube able to flush without difficulty. Possible kink in tube causing inability to flush. Tube was then repositioned to obtain post-pyloric position  A 10 F Cortrak tube was placed in the RIGHT nare and secured with a nasal bridle at 95 cm. Per the Cortrak monitor reading the tube tip is post-pyloric.    X-ray is required, abdominal x-ray has been ordered by the Cortrak team. Please confirm tube placement before using the Cortrak tube.   If the tube becomes dislodged please keep the tube and contact the Cortrak team at [email or web address] (password [email or web address]) for replacement.  If after hours and replacement cannot be delayed, place a NG tube and confirm placement with an abdominal x-ray.   Romelle Starcherate Yavuz Kirby MS, RD, LDN, CNSC [phone] Pager  [phone] Weekend/On-Call Pager

## 2018-02-19 ENCOUNTER — Inpatient Hospital Stay (HOSPITAL_COMMUNITY): Payer: Medicare HMO

## 2018-02-19 LAB — GLUCOSE, CAPILLARY
GLUCOSE-CAPILLARY: 113 mg/dL — AB (ref 70–99)
GLUCOSE-CAPILLARY: 125 mg/dL — AB (ref 70–99)
Glucose-Capillary: 82 mg/dL (ref 70–99)
Glucose-Capillary: 89 mg/dL (ref 70–99)
Glucose-Capillary: 123 mg/dL — ABNORMAL HIGH (ref 70–99)
Glucose-Capillary: 111 mg/dL — ABNORMAL HIGH (ref 70–99)

## 2018-02-19 LAB — CBC WITH DIFFERENTIAL/PLATELET
Abs Immature Granulocytes: 0.1 10*3/uL (ref 0.0–0.1)
Basophils Absolute: 0.1 10*3/uL (ref 0.0–0.1)
Basophils Relative: 1 %
Eosinophils Absolute: 0.2 10*3/uL (ref 0.0–0.7)
Eosinophils Relative: 1 %
HEMATOCRIT: 30.9 % — AB (ref 39.0–52.0)
HEMOGLOBIN: 9.7 g/dL — AB (ref 13.0–17.0)
IMMATURE GRANULOCYTES: 1 %
LYMPHS ABS: 1 10*3/uL (ref 0.7–4.0)
LYMPHS PCT: 7 %
MCH: 31.5 pg (ref 26.0–34.0)
MCHC: 31.4 g/dL (ref 30.0–36.0)
MCV: 100.3 fL — ABNORMAL HIGH (ref 78.0–100.0)
Monocytes Absolute: 1 10*3/uL (ref 0.1–1.0)
Monocytes Relative: 7 %
NEUTROS PCT: 83 %
Neutro Abs: 12.4 10*3/uL — ABNORMAL HIGH (ref 1.7–7.7)
Platelets: 352 10*3/uL (ref 150–400)
RBC: 3.08 MIL/uL — AB (ref 4.22–5.81)
RDW: 13.4 % (ref 11.5–15.5)
WBC: 14.8 10*3/uL — AB (ref 4.0–10.5)

## 2018-02-19 LAB — BASIC METABOLIC PANEL
Anion gap: 9 (ref 5–15)
BUN: 16 mg/dL (ref 8–23)
CO2: 29 mmol/L (ref 22–32)
CREATININE: 0.77 mg/dL (ref 0.61–1.24)
Calcium: 7.7 mg/dL — ABNORMAL LOW (ref 8.9–10.3)
Chloride: 98 mmol/L (ref 98–111)
GFR calc Af Amer: >60 mL/min (ref >60)
Glucose, Bld: 110 mg/dL — ABNORMAL HIGH (ref 70–99)
Potassium: 4.4 mmol/L (ref 3.5–5.1)
SODIUM: 136 mmol/L (ref 135–145)

## 2018-02-19 LAB — BODY FLUID CELL COUNT WITH DIFFERENTIAL
EOS FL: 1 %
Lymphs, Fluid: 2 %
Monocyte-Macrophage-Serous Fluid: 6 % — ABNORMAL LOW (ref 50–90)
Neutrophil Count, Fluid: 92 % — ABNORMAL HIGH (ref 0–25)
WBC FLUID: UNDETERMINED uL (ref 0–1000)

## 2018-02-19 MED ORDER — MORPHINE SULFATE (CONCENTRATE) 10 MG/0.5ML PO SOLN
5.0000 mg | Freq: Four times a day (QID) | ORAL | Status: DC
  Administered 2018-02-19 – 2018-02-22 (×12): 5 mg
  Filled 2018-02-19 (×13): qty 0.5

## 2018-02-19 MED ORDER — CLONAZEPAM 0.5 MG PO TABS
0.5000 mg | ORAL_TABLET | Freq: Two times a day (BID) | ORAL | Status: DC
  Administered 2018-02-19 – 2018-02-24 (×10): 0.5 mg
  Filled 2018-02-19 (×10): qty 1

## 2018-02-19 MED ORDER — SODIUM CHLORIDE 0.9 % IV SOLN: Freq: Once | INTRAVENOUS | Status: DC

## 2018-02-19 MED ORDER — LIDOCAINE HCL 1 % IJ SOLN
5.0000 mL | Freq: Once | INTRAMUSCULAR | Status: AC
  Administered 2018-02-19: 2 mL
  Filled 2018-02-19: qty 5

## 2018-02-19 MED ORDER — MIDAZOLAM HCL 2 MG/2ML IJ SOLN
4.0000 mg | Freq: Once | INTRAMUSCULAR | Status: AC
  Administered 2018-02-19: 4 mg via INTRAVENOUS
  Filled 2018-02-19: qty 4

## 2018-02-19 MED ORDER — ASPIRIN 81 MG PO CHEW
81.0000 mg | CHEWABLE_TABLET | Freq: Every day | ORAL | Status: DC
  Administered 2018-02-19 – 2018-02-24 (×6): 81 mg
  Filled 2018-02-19 (×6): qty 1

## 2018-02-19 MED ORDER — FENTANYL CITRATE (PF) 100 MCG/2ML IJ SOLN
100.0000 ug | Freq: Once | INTRAMUSCULAR | Status: AC
  Administered 2018-02-19: 100 ug via INTRAVENOUS
  Filled 2018-02-19: qty 2

## 2018-02-19 MED ORDER — LIDOCAINE HCL 2 % IJ SOLN
5.0000 mL | Freq: Once | INTRAMUSCULAR | Status: DC
  Filled 2018-02-19: qty 10

## 2018-02-19 MED ORDER — MIDAZOLAM BOLUS VIA INFUSION
2.0000 mg | INTRAVENOUS | Status: DC | PRN
  Administered 2018-02-21 – 2018-02-22 (×3): 2 mg via INTRAVENOUS
  Filled 2018-02-19: qty 2

## 2018-02-19 MED ORDER — ROCURONIUM BROMIDE 50 MG/5ML IV SOLN
1.0000 mg/kg | Freq: Once | INTRAVENOUS | Status: AC
  Administered 2018-02-19: 50 mg via INTRAVENOUS

## 2018-02-19 NOTE — Procedures (Signed)
Bronchoscopy Procedure Note Curtis Preston [id number] [birth year]  Procedure: Bronchoscopy Indications: Diagnostic evaluation of the airways and Obtain specimens for culture and/or other diagnostic studies  Procedure Details Consent: Unable to obtain consent because of altered level of consciousness. Time Out: Verified patient identification, verified procedure, site/side was marked, verified correct patient position, special equipment/implants available, medications/allergies/relevent history reviewed, required imaging and test results available.  Performed  In preparation for procedure, patient was given 100% FiO2, bronchoscope lubricated and lidocaine given via ETT (3 ml). Sedation: Benzodiazepines, Muscle relaxants and fentanyl  Airway entered and the following bronchi were examined: RUL, RML, RLL, LUL, LLL and Bronchi.   Procedures performed: BAL performed x3 with 34m saline. Bronchoscope removed.   Findings: airways normal with no endobronchial lesions. Copious white/grey secretions from lower lung zones.   Evaluation Hemodynamic Status: BP stable throughout; O2 sats: stable throughout Patient's Current Condition: stable Specimens:  Sent whitish gray secretions. Complications: No apparent complications Patient did tolerate procedure well.   Curtis Preston 02/19/2018

## 2018-02-19 NOTE — Progress Notes (Signed)
Bronched patient with 100 fent, 4 versed, 1% lidocaine, and 50 of rocuronium. 150 cc bolus given during bronch. Will continue to monitor closely. Huntley EstelleLewis, Percell Lamboy E, RN 02/19/2018 9:49 AM

## 2018-02-19 NOTE — Progress Notes (Signed)
PULMONARY / CRITICAL CARE MEDICINE   NAME:  Curtis Preston, MRN:  [id number], DOB:  1950-06-06, LOS: 62 ADMISSION DATE:  02/06/2018, CONSULTATION DATE:  02/09/2018 REFERRING MD:  Algis Liming, CHIEF COMPLAINT:  PNA in an immunocompromised patient  BRIEF HISTORY:    67 year old male with PMH as below, which is significant for Myasthenia gravis on imuran, COPD, PE on Xarelto, and CAD. He was in his usual state of health (gets winded daily) until about early August of this year, when he developed worsening shortness of breath with associated productive cough (green sputum). This persisted for about 6 weeks, until about 9/6, when his symptoms significant worsened after traveling here to [place] from [place] (where he lives). SOB worsened as did his productive cough, which caused him to present to ED. Workup in ED suggested pneumonia and he was started on empiric antibiotics. CT demonstrated bilateral ground glass, and most concerning for multi-focal pneumonia. Infectious disease consultation sees benefit in bronchoscopy for culture. PCCM consulted and recommended transfer to the intensive care unit for monitoring of respiratory status.  Re-intubated for hypoxia 9/15.  Subsequent self-extubation, but not able to manage secretions, so intubated again.   SIGNIFICANT EVENTS:   Remains intubated with periods of agitation. Tolerating PSV.  SUBJECTIVE:    Off IV sedation. Less responsive. Morning morphine held.  CONSTITUTIONAL: BP (!) 160/78   Pulse (!) 104   Temp 98.9 F (37.2 C) (Oral)   Resp (!) 25   Ht _0  (1.854 m)   Wt 75.6 kg   SpO2 98%   BMI 21.99 kg/m   I/O last 3 completed shifts: In: 5100.9 [I.V.:2605.5; NG/GT:714.8; IV Piggyback:1780.6] Out: 4677.7 [Urine:4677.7]     Vent Mode: CPAP;PSV FiO2 (%):  [40 %] 40 % Set Rate:  [16 bmp] 16 bmp Vt Set:  [640 mL] 640 mL PEEP:  [5 cmH20] 5 cmH20 Pressure Support:  [5 cmH20] 5 cmH20 Plateau Pressure:  [16 KYH06-23 cmH20] 17  cmH20  PHYSICAL EXAM: General:  WD/Wn NAD Neuro:  No focal findings - sedated but easily arousable. HEENT:   ETT/OGT in place with no skin breakdown. Cardiovascular:  HS normal with warm extremities Lungs: Coarse crackles posteriorly.  Taking large tidal volume breaths independently of level of pressure support. Abdomen: Distended with rare bowel sounds. Musculoskeletal:  No active joints Skin:  Line sites intact, no rashes.      ASSESSMENT AND PLAN    Hypoxemic respiratory failure. MV on PRVC ~ 7lpm. Weaning complicated by sedation requirements.  Improving lung mechanics.  Wean sedation and start SBT.  Will diurese to further improve lung mechanics. Bronchoscopy today to complete work up for ILD - then will work towards extubation.  Subacute ILD. Work up in progress. As myasthenic crisis ruled out, so chronic aspiration less likely. CVTS has declined lung biopsy.  Repeat CT chest and BAL as part of autoimmune work up.  ALL cultures negative. Smear negative x2 - off isolation.  Myasthenia Gravis. Neurol note appreciated; not in myasthenic crisis and does not need plasma exchange; Imuran off.  Hx recurrent VTE and PE. Xarelto.  Anxiety and high sedation requirements.  Agitated delirium.  Now better controlled. Taper enteral sedatives  SUMMARY OF TODAY'S PLAN:   Continue ventilatory support. Will work towards extubation.   Best Practice / Goals of Care / Disposition.   DVT PROPHYLAXIS: Xarelto. NUTRITION: resume feed via post-pyloric tube. MOBILITY: bedrest. GOALS OF CARE:  Early extubation   LABS   Relevant labs and investigations personally  reviewed.  Mild stable leukocytosis.  Mild hyponatremia - likely due to mild volume overload.   Abdominal XR today: feeding tube in duodenum. .  CXR shows progressive patchy interstitial and alveolar disease.  All respiratory cultures are negative so far with no organisms seen. AFB from deep tracheal aspirate is negative x2    CRITICAL CARE Performed by: Kipp Brood   Total critical care time: 35 minutes  Critical care time was exclusive of separately billable procedures and treating other patients.  Critical care was necessary to treat or prevent imminent or life-threatening deterioration.  Critical care was time spent personally by me on the following activities: development of treatment plan with patient and/or surrogate as well as nursing, discussions with consultants, evaluation of patient's response to treatment, examination of patient, obtaining history from patient or surrogate, ordering and performing treatments and interventions, ordering and review of laboratory studies, ordering and review of radiographic studies, pulse oximetry and re-evaluation of patient's condition.   Kipp Brood, MD [facility] ICU Physician [place]  Pager: [phone] Mobile: [phone] After hours: [phone].

## 2018-02-20 LAB — GRAM STAIN

## 2018-02-20 LAB — GLUCOSE, CAPILLARY
GLUCOSE-CAPILLARY: 112 mg/dL — AB (ref 70–99)
GLUCOSE-CAPILLARY: 131 mg/dL — AB (ref 70–99)
GLUCOSE-CAPILLARY: 161 mg/dL — AB (ref 70–99)
GLUCOSE-CAPILLARY: 170 mg/dL — AB (ref 70–99)
Glucose-Capillary: 119 mg/dL — ABNORMAL HIGH (ref 70–99)
Glucose-Capillary: 122 mg/dL — ABNORMAL HIGH (ref 70–99)

## 2018-02-20 LAB — URINALYSIS, ROUTINE W REFLEX MICROSCOPIC
BILIRUBIN URINE: NEGATIVE
GLUCOSE, UA: NEGATIVE mg/dL
Ketones, ur: NEGATIVE mg/dL
LEUKOCYTES UA: NEGATIVE
NITRITE: NEGATIVE
Protein, ur: NEGATIVE mg/dL
SPECIFIC GRAVITY, URINE: 1.021 (ref 1.005–1.030)
pH: 5 (ref 5.0–8.0)

## 2018-02-20 LAB — SEDIMENTATION RATE: Sed Rate: 80 mm/hr — ABNORMAL HIGH (ref 0–16)

## 2018-02-20 LAB — BASIC METABOLIC PANEL
ANION GAP: 7 (ref 5–15)
BUN: 20 mg/dL (ref 8–23)
CHLORIDE: 99 mmol/L (ref 98–111)
CO2: 32 mmol/L (ref 22–32)
Calcium: 7.8 mg/dL — ABNORMAL LOW (ref 8.9–10.3)
Creatinine, Ser: 0.7 mg/dL (ref 0.61–1.24)
Glucose, Bld: 125 mg/dL — ABNORMAL HIGH (ref 70–99)
POTASSIUM: 4.7 mmol/L (ref 3.5–5.1)
SODIUM: 138 mmol/L (ref 135–145)

## 2018-02-20 LAB — CBC WITH DIFFERENTIAL/PLATELET
Abs Immature Granulocytes: 0.2 10*3/uL — ABNORMAL HIGH (ref 0.0–0.1)
Basophils Absolute: 0.1 10*3/uL (ref 0.0–0.1)
Basophils Relative: 0 %
EOS PCT: 1 %
Eosinophils Absolute: 0.2 10*3/uL (ref 0.0–0.7)
HCT: 33.1 % — ABNORMAL LOW (ref 39.0–52.0)
HEMOGLOBIN: 10.5 g/dL — AB (ref 13.0–17.0)
Immature Granulocytes: 1 %
LYMPHS PCT: 6 %
Lymphs Abs: 1.1 10*3/uL (ref 0.7–4.0)
MCH: 31.8 pg (ref 26.0–34.0)
MCHC: 31.7 g/dL (ref 30.0–36.0)
MCV: 100.3 fL — ABNORMAL HIGH (ref 78.0–100.0)
Monocytes Absolute: 1.2 10*3/uL — ABNORMAL HIGH (ref 0.1–1.0)
Monocytes Relative: 7 %
Neutro Abs: 14.6 10*3/uL — ABNORMAL HIGH (ref 1.7–7.7)
Neutrophils Relative %: 85 %
Platelets: 403 10*3/uL — ABNORMAL HIGH (ref 150–400)
RBC: 3.3 MIL/uL — ABNORMAL LOW (ref 4.22–5.81)
RDW: 13.6 % (ref 11.5–15.5)
WBC: 17.3 10*3/uL — AB (ref 4.0–10.5)

## 2018-02-20 LAB — LACTIC ACID, PLASMA
LACTIC ACID, VENOUS: 1.1 mmol/L (ref 0.5–1.9)
Lactic Acid, Venous: 0.6 mmol/L (ref 0.5–1.9)

## 2018-02-20 MED ORDER — INSULIN ASPART 100 UNIT/ML ~~LOC~~ SOLN
0.0000 [IU] | SUBCUTANEOUS | Status: DC
  Administered 2018-02-20: 4 [IU] via SUBCUTANEOUS
  Administered 2018-02-21 (×4): 2 [IU] via SUBCUTANEOUS
  Administered 2018-02-21: 4 [IU] via SUBCUTANEOUS
  Administered 2018-02-21 – 2018-02-24 (×7): 2 [IU] via SUBCUTANEOUS

## 2018-02-20 MED ORDER — METHYLPREDNISOLONE SODIUM SUCC 125 MG IJ SOLR
125.0000 mg | Freq: Once | INTRAMUSCULAR | Status: AC
  Administered 2018-02-20: 125 mg via INTRAVENOUS
  Filled 2018-02-20: qty 2

## 2018-02-20 MED ORDER — MIRTAZAPINE 15 MG PO TBDP
15.0000 mg | ORAL_TABLET | Freq: Every day | ORAL | Status: DC
  Administered 2018-02-20 – 2018-02-23 (×3): 15 mg
  Filled 2018-02-20 (×4): qty 1

## 2018-02-20 MED ORDER — MONTELUKAST SODIUM 10 MG PO TABS
10.0000 mg | ORAL_TABLET | Freq: Every day | ORAL | Status: DC
  Administered 2018-02-20 – 2018-02-23 (×3): 10 mg
  Filled 2018-02-20 (×4): qty 1

## 2018-02-20 MED ORDER — METHYLPREDNISOLONE SODIUM SUCC 125 MG IJ SOLR
80.0000 mg | Freq: Every day | INTRAMUSCULAR | Status: DC
  Administered 2018-02-21 – 2018-02-26 (×6): 80 mg via INTRAVENOUS
  Filled 2018-02-20 (×6): qty 2

## 2018-02-20 MED ORDER — ALTEPLASE 2 MG IJ SOLR
2.0000 mg | Freq: Once | INTRAMUSCULAR | Status: DC
  Filled 2018-02-20: qty 2

## 2018-02-20 MED ORDER — FAMOTIDINE 40 MG/5ML PO SUSR
20.0000 mg | Freq: Two times a day (BID) | ORAL | Status: DC
  Administered 2018-02-20 – 2018-02-22 (×5): 20 mg
  Filled 2018-02-20 (×6): qty 2.5

## 2018-02-20 MED ORDER — QUETIAPINE FUMARATE 50 MG PO TABS
50.0000 mg | ORAL_TABLET | Freq: Two times a day (BID) | ORAL | Status: DC
  Administered 2018-02-20 – 2018-02-24 (×7): 50 mg
  Filled 2018-02-20 (×8): qty 1

## 2018-02-20 MED ORDER — ACETAMINOPHEN 160 MG/5ML PO SOLN
650.0000 mg | Freq: Four times a day (QID) | ORAL | Status: DC | PRN
  Administered 2018-02-20: 650 mg
  Filled 2018-02-20 (×2): qty 20.3

## 2018-02-20 MED ORDER — POLYETHYLENE GLYCOL 3350 17 G PO PACK
17.0000 g | PACK | Freq: Two times a day (BID) | ORAL | Status: DC | PRN
  Filled 2018-02-20: qty 1

## 2018-02-20 NOTE — Progress Notes (Signed)
eLink Physician-Brief Progress Note Patient Name: Curtis Preston DOB: 04/26/1951 MRN: [id number]   Date of Service  02/20/2018  HPI/Events of Note  Patient with temp to 102.4.  WBC with gradual elevation over past day.  PCXR yesterday with patchy airspace dz improved.  HD stable.  No ABX currently  eICU Interventions  Plan: Tylenol PRN fever Blood cultures x 2 Urinalysis and urine culture Check lactate Hold on ABX for now     Intervention Category Intermediate Interventions: Other:  Amaia Lavallie 02/20/2018, 3:33 AM

## 2018-02-20 NOTE — Care Management Note (Signed)
Case Management Note Patient Details  Name: Curtis Preston MRN: [id number] Date of Birth: 1950-09-21  Subjective/Objective:  67 year old male presented with worsening SOB and productive cough. PMH which is significant for Myasthenia gravis on imuran, COPD, PE on Xarelto, and CAD. Patient traveled  to [place] from [place] (where he lives) to visit his family. Patient is followed by [facility] [phone], unsure of his PCP's name; [facility]: Dr. Jani Files [phone]. Rx are obtained through [facility]. Patient indicated he lives at home with his fiancee, independent with ADLs PTA, and he occasionally utilizes a Programmer, multimedia.                  Action/Plan: Upon speaking to Dr. Nelda Marseille, patient wants to return back to [place], with assistance from CM requested. Patient is currently on HFO2 @ 8L, with transport via airplane not an option at this time d/t patient's current high O2 requirement. Non-emergent ambulance transport isn't covered by Medicare, with cost based on mileage, with payment often required upfront-oxygen cost required for transport would be separate from transport cost. CM met with patient to discuss transitional needs. Patient informed CM of his Veteran status, and suggested [place] could possibly assist with providing ambulance transportation. Patient also indicated he could ask his brother (who lives locally), if he's agreeable to providing car transport to [place], and will update CM. Patient denied having home O2 PTA; CM can assist with the arrangement of home/portable O2 but would require a <O2 liter flow, qualifying O2 sats to qualify for a portable concentrator. CM will contact patient's [place] providers, as requested, to inquire if transport arrangement is an option and will update patient/attending once determined.  CM will continue to follow.   Expected Discharge Date:  02/08/18               Expected Discharge  Plan:  (TBD)  In-House Referral:  NA  Discharge planning Services  CM Consult, DC out of service area  Post Acute Care Choice:  NA Choice offered to:  NA  DME Arranged:  N/A DME Agency:  NA  HH Arranged:  NA HH Agency:  NA  Status of Service:  In process, will continue to follow  If discussed at Long Length of Stay Meetings, dates discussed:    Additional Comments 02/20/2018  Pt remains on ETT tube - CM will continue to follow   02/17/18 CM received call from [facility] provided verbal update of pts medical status- VA not requesting hardcopy documentation at this time other than refusal of transfer form.  CM discussed/explained form with fiance Karle Plumber via phone - fiance refused transfer to local [place] and informed CM that she wants pt to remain at [facility] (pt is currently intubated and sedated).  CM AC provided second verification to discussion and refusal for transfer, CM also signed the form as second verification.  Refusal to Transfer Form faxed to [facility] as requested.   Finance informed CM that if pt is able to ride in private vehicle at discharge she will come and pick pt up  Pt remains intubated- per attending may require a trach at some point.  CM contacted VA transfer and was informed pt will not be able receive transportation from the [place] to [place] unless; he is already in a [place] facility and has left than 6 months to live.  CM questioned if the [place] prefers a transfer to a local  facility with ICU capability as pt remains critically ill at this point, VA will support the decision of the attending at [facility] - however pt can remain at [facility] if appropriate and will need family/friend to sign consent prior to transfer.  CM discussed case with attending - at this time pt is to critical for transfer, attending will readdress 9/18.  CM reached out to pts wife to discuss - pt informed CM that she wants the pt to remain here at [facility] and not transfer to [place] informed.     02/17/18 Pt now on 93M and intubated.  CM contacted [facility] [place] and was told to reach out to the [place] transport department at [phone] ext [phone].  02/13/18 @ 1538-Natalie Gay RNCM-Call received from April, [place] transfer coordinator. [employer], SW for [facility] will be the point of contact to assist with VA benefit needs. [phone] ext. [phone]/pager: [phone].   02/12/18 @ 1454-Natalie Gay RNCM-CM spoke to the transfer coordinator for [place] to inquire if transportation could be arranged under patient's VA benefits. CM was informed to contact the transport coordinator at the nearest [place] facility to review patient's transfer benefits, then initiate the transportation if coverage was determined. Patient [place] PCP was verified as: Dr. Delynn Flavin @ [phone]; Case Manager: Cora Daniels @ [phone]: [phone]. CM spoke to April, transfer coordinator with [facility]; patient's information must be entered/loaded into [facility] system prior to verifying benefits. April will f/u with CM on 02/13/18 once his benefits are determined. CM updated patient and will continue to follow.   Midge Minium RN, BSN, NCM-BC, ACM-RN [phone] 02/20/2018, 4:20 PM

## 2018-02-20 NOTE — Progress Notes (Signed)
eLink Physician-Brief Progress Note Patient Name: Curtis Preston DOB: 03/10/1951 MRN: [id number]   Date of Service  02/20/2018  HPI/Events of Note  Hyperglycemia  eICU Interventions  Correction dose insulin coverage orders placed        Adri Schloss U Melecio Cueto 02/20/2018, 8:40 PM

## 2018-02-20 NOTE — Progress Notes (Signed)
PULMONARY / CRITICAL CARE MEDICINE   NAME:  Curtis Preston, MRN:  [id number], DOB:  August 17, 1950, LOS: 66 ADMISSION DATE:  02/06/2018, CONSULTATION DATE:  02/09/2018 REFERRING MD:  Algis Liming, CHIEF COMPLAINT:  PNA in an immunocompromised patient  BRIEF HISTORY:    67 year old male with PMH as below, which is significant for Myasthenia gravis on imuran, COPD, PE on Xarelto, and CAD. He was in his usual state of health (gets winded daily) until about early August of this year, when he developed worsening shortness of breath with associated productive cough (green sputum). This persisted for about 6 weeks, until about 9/6, when his symptoms significant worsened after traveling here to [place] from [place] (where he lives). SOB worsened as did his productive cough, which caused him to present to ED. Workup in ED suggested pneumonia and he was started on empiric antibiotics. CT demonstrated bilateral ground glass, and most concerning for multi-focal pneumonia. Infectious disease consultation sees benefit in bronchoscopy for culture. PCCM consulted and recommended transfer to the intensive care unit for monitoring of respiratory status.   SIGNIFICANT EVENTS:   Remains intubated with periods of agitation. Tolerating PSV.  Re-intubated for hypoxia 9/15.  Subsequent self-extubation, but not able to manage secretions, so intubated again.   Repeat bronchoscopy to complete work-up performed.  SUBJECTIVE:    Off IV sedation. Less responsive. Morning morphine held.  CONSTITUTIONAL: BP 133/65   Pulse 93   Temp 98.6 F (37 C) (Oral)   Resp (!) 24   Ht _0  (1.854 m)   Wt 74.7 kg   SpO2 98%   BMI 21.73 kg/m   I/O last 3 completed shifts: In: 3262.1 [I.V.:1208.8; Other:35; OM/VE:7209.4; IV Piggyback:100] Out: 2085 [Urine:2085]    Vent Mode: PRVC FiO2 (%):  [40 %] 40 % Set Rate:  [16 bmp] 16 bmp Vt Set:  [640 mL] 640 mL PEEP:  [10 cmH20] 10 cmH20 Plateau Pressure:  [11 cmH20-20 cmH20] 20  cmH20  PHYSICAL EXAM: General:  Thin man with generally reduced muscle mass. Neuro:  No focal findings - sedated but easily arousable. HEENT:   ETT/OGT in place with no skin breakdown. Cardiovascular:  HS normal with warm extremities Lungs: Coarse crackles posteriorly are improving.  Taking large tidal volume breaths independently of level of pressure support. Abdomen: Soft with active bowel sounds. Musculoskeletal:  No active joints Skin:  Line sites intact, no rashes.      ASSESSMENT AND PLAN    Hypoxemic respiratory failure. MV on PRVC ~ 7lpm. Weaning complicated by sedation requirements.  Improving lung mechanics.  Wean sedation and start SBT.  Will diurese to further improve lung mechanics. Bronchoscopy today to complete work up for ILD done. SBT to assess mechanical readiness for extubation. IF passes, trial of extubation.  Subacute ILD. Work up in progress. As myasthenic crisis ruled out, so chronic aspiration less likely. CVTS has declined lung biopsy.  Repeat CT chest and BAL as part of autoimmune work up.  ALL cultures negative. Smear negative x2 - off isolation. CVD work-up sent. Given negative microbiological studies, will start trial of steroids.  Myasthenia Gravis. Neurol note appreciated; not in myasthenic crisis and does not need plasma exchange; Imuran off.  Hx recurrent VTE and PE. Xarelto.  Anxiety and high sedation requirements.  Agitated delirium.  Now better controlled. Taper enteral sedatives  SUMMARY OF TODAY'S PLAN:   Continue ventilatory support. Will work towards extubation.   Best Practice / Goals of Care / Disposition.   DVT PROPHYLAXIS:  Xarelto. NUTRITION: resume feed via post-pyloric tube. MOBILITY: bedrest. GOALS OF CARE:  Early extubation   LABS   Relevant labs and investigations personally reviewed.  Mild stable leukocytosis.  Mild hyponatremia - likely due to mild volume overload.   Abdominal XR today: feeding tube in duodenum.  .  CXR shows progressive patchy interstitial and alveolar disease.  All respiratory cultures are negative so far with no organisms seen. AFB from deep tracheal aspirate is negative x2   Cell count from bronchoscopy showed small number of neutrophil predominant WBC. Reported organisms, but negative gram stain.  CRITICAL CARE Performed by: Kipp Brood   Total critical care time: 50 minutes  Critical care time was exclusive of separately billable procedures and treating other patients.  Critical care was necessary to treat or prevent imminent or life-threatening deterioration.  Critical care was time spent personally by me on the following activities: development of treatment plan with patient and/or surrogate as well as nursing, discussions with consultants, evaluation of patient's response to treatment, examination of patient, obtaining history from patient or surrogate, ordering and performing treatments and interventions, ordering and review of laboratory studies, ordering and review of radiographic studies, pulse oximetry and re-evaluation of patient's condition.   Kipp Brood, MD [facility] ICU Physician [place]  Pager: [phone] Mobile: [phone] After hours: [phone].

## 2018-02-20 NOTE — Progress Notes (Signed)
VAST to pt's room to assess CL. Pt's RN, Brayton CavesJessie, stated dressing was wet and she redressed it. She explained no blood return from all three lumens, although they all flush easily. VAST RN assessed right central line with same results (no blood return, flushes easily). VAST RN noted dressing to be wet.  CXR from 09/19 revealed right central venous line tip overlies the upper SVC and left central venous line tip overlies the lower SVC.  Spoke with Brayton CavesJessie, RN about pt's left port, asking if there was any reason it could not be used. MD ordered port to be accessed for fluid and med administration upon hearing conversation. VAST RN accessed port per protocol utilizing sterile technique; obtained 5 lab tubes for unit RN.

## 2018-02-21 LAB — BLOOD CULTURE ID PANEL (REFLEXED)
Acinetobacter baumannii: NOT DETECTED
Candida albicans: NOT DETECTED
Candida glabrata: NOT DETECTED
Candida krusei: NOT DETECTED
Candida parapsilosis: NOT DETECTED
Candida tropicalis: NOT DETECTED
ENTEROCOCCUS SPECIES: NOT DETECTED
Enterobacter cloacae complex: NOT DETECTED
Enterobacteriaceae species: NOT DETECTED
Escherichia coli: NOT DETECTED
HAEMOPHILUS INFLUENZAE: NOT DETECTED
Klebsiella oxytoca: NOT DETECTED
Klebsiella pneumoniae: NOT DETECTED
LISTERIA MONOCYTOGENES: NOT DETECTED
NEISSERIA MENINGITIDIS: NOT DETECTED
PROTEUS SPECIES: NOT DETECTED
Pseudomonas aeruginosa: NOT DETECTED
SERRATIA MARCESCENS: NOT DETECTED
STAPHYLOCOCCUS AUREUS BCID: NOT DETECTED
STAPHYLOCOCCUS SPECIES: NOT DETECTED
STREPTOCOCCUS AGALACTIAE: NOT DETECTED
STREPTOCOCCUS PNEUMONIAE: NOT DETECTED
STREPTOCOCCUS SPECIES: DETECTED — AB
Streptococcus pyogenes: NOT DETECTED

## 2018-02-21 LAB — GLUCOSE, CAPILLARY
GLUCOSE-CAPILLARY: 125 mg/dL — AB (ref 70–99)
GLUCOSE-CAPILLARY: 132 mg/dL — AB (ref 70–99)
GLUCOSE-CAPILLARY: 132 mg/dL — AB (ref 70–99)
GLUCOSE-CAPILLARY: 144 mg/dL — AB (ref 70–99)
Glucose-Capillary: 139 mg/dL — ABNORMAL HIGH (ref 70–99)
Glucose-Capillary: 138 mg/dL — ABNORMAL HIGH (ref 70–99)

## 2018-02-21 LAB — URINE CULTURE: Culture: NO GROWTH

## 2018-02-21 LAB — CBC WITH DIFFERENTIAL/PLATELET
Abs Immature Granulocytes: 0.1 10*3/uL (ref 0.0–0.1)
Basophils Absolute: 0 10*3/uL (ref 0.0–0.1)
Basophils Relative: 0 %
EOS ABS: 0 10*3/uL (ref 0.0–0.7)
EOS PCT: 0 %
HEMATOCRIT: 30.6 % — AB (ref 39.0–52.0)
Hemoglobin: 9.8 g/dL — ABNORMAL LOW (ref 13.0–17.0)
IMMATURE GRANULOCYTES: 1 %
LYMPHS ABS: 0.9 10*3/uL (ref 0.7–4.0)
Lymphocytes Relative: 7 %
MCH: 32.2 pg (ref 26.0–34.0)
MCHC: 32 g/dL (ref 30.0–36.0)
MCV: 100.7 fL — AB (ref 78.0–100.0)
MONO ABS: 0.4 10*3/uL (ref 0.1–1.0)
MONOS PCT: 4 %
NEUTROS PCT: 88 %
Neutro Abs: 10.9 10*3/uL — ABNORMAL HIGH (ref 1.7–7.7)
PLATELETS: 380 10*3/uL (ref 150–400)
RBC: 3.04 MIL/uL — ABNORMAL LOW (ref 4.22–5.81)
RDW: 13.4 % (ref 11.5–15.5)
WBC: 12.4 10*3/uL — ABNORMAL HIGH (ref 4.0–10.5)

## 2018-02-21 LAB — COMPREHENSIVE METABOLIC PANEL
ALT: 26 U/L (ref 0–44)
AST: 44 U/L — ABNORMAL HIGH (ref 15–41)
Albumin: 1.5 g/dL — ABNORMAL LOW (ref 3.5–5.0)
Alkaline Phosphatase: 105 U/L (ref 38–126)
Anion gap: 10 (ref 5–15)
BILIRUBIN TOTAL: 0.4 mg/dL (ref 0.3–1.2)
BUN: 32 mg/dL — AB (ref 8–23)
CALCIUM: 7.8 mg/dL — AB (ref 8.9–10.3)
CO2: 29 mmol/L (ref 22–32)
Chloride: 101 mmol/L (ref 98–111)
Creatinine, Ser: 0.67 mg/dL (ref 0.61–1.24)
GFR calc Af Amer: >60 mL/min (ref >60)
Glucose, Bld: 158 mg/dL — ABNORMAL HIGH (ref 70–99)
POTASSIUM: 4.6 mmol/L (ref 3.5–5.1)
Sodium: 140 mmol/L (ref 135–145)
TOTAL PROTEIN: 4.9 g/dL — AB (ref 6.5–8.1)

## 2018-02-21 LAB — SJOGRENS SYNDROME-A EXTRACTABLE NUCLEAR ANTIBODY

## 2018-02-21 LAB — ANTI-DNA ANTIBODY, DOUBLE-STRANDED: ds DNA Ab: <1 IU/mL (ref 0–9)

## 2018-02-21 LAB — ANTI-SCLERODERMA ANTIBODY: Scleroderma (Scl-70) (ENA) Antibody, IgG: <0.2 AI (ref 0.0–0.9)

## 2018-02-21 LAB — ANA: Anti Nuclear Antibody(ANA): NEGATIVE

## 2018-02-21 LAB — SJOGRENS SYNDROME-B EXTRACTABLE NUCLEAR ANTIBODY: SSB (La) (ENA) Antibody, IgG: <0.2 AI (ref 0.0–0.9)

## 2018-02-21 NOTE — Progress Notes (Signed)
PULMONARY / CRITICAL CARE MEDICINE   NAME:  Curtis Preston, MRN:  [id number], DOB:  [birth year], LOS: 20 ADMISSION DATE:  02/06/2018, CONSULTATION DATE:  02/09/2018 REFERRING MD:  Algis Liming, CHIEF COMPLAINT:  PNA in an immunocompromised patient  BRIEF HISTORY:    67 year old male with PMH as below, which is significant for Myasthenia gravis on imuran, COPD, PE on Xarelto, and CAD. He was in his usual state of health (gets winded daily) until about early August of this year, when he developed worsening shortness of breath with associated productive cough (green sputum). This persisted for about 6 weeks, until about 9/6, when his symptoms significant worsened after traveling here to [place] from [place] (where he lives). SOB worsened as did his productive cough, which caused him to present to ED. Workup in ED suggested pneumonia and he was started on empiric antibiotics. CT demonstrated bilateral ground glass, and most concerning for multi-focal pneumonia. Infectious disease consultation sees benefit in bronchoscopy for culture. PCCM consulted and recommended transfer to the intensive care unit for monitoring of respiratory status.   SIGNIFICANT EVENTS:   Remains intubated with periods of agitation. Tolerating PSV.  Re-intubated for hypoxia 9/15.  Subsequent self-extubation, but not able to manage secretions, so intubated again.   Repeat bronchoscopy to complete work-up performed.  SUBJECTIVE:    Continues to require iv sedation now that enteral doses decreased.  Awakes readily.  CONSTITUTIONAL: BP (!) 162/74   Pulse 97   Temp (!) 97.5 F (36.4 C) (Axillary)   Resp 15   Ht _0  (1.854 m)   Wt 78.2 kg   SpO2 98%   BMI 22.75 kg/m   I/O last 3 completed shifts: In: 3373.7 [I.V.:853.7; Other:30; NG/GT:2390; IV Piggyback:100] Out: 2325 [Urine:2325]    Vent Mode: CPAP;PSV FiO2 (%):  [40 %] 40 % Set Rate:  [16 bmp] 16 bmp Vt Set:  [640 mL] 640 mL PEEP:  [5 cmH20-10 cmH20] 5  cmH20 Pressure Support:  [10 cmH20] 10 cmH20 Plateau Pressure:  [16 cmH20-20 cmH20] 20 cmH20  PHYSICAL EXAM: General:  Thin man with generally reduced muscle mass. Neuro:  No focal findings - sedated but easily arousable. Full strength when agitated. HEENT:   ETT/OGT in place with no skin breakdown. Cardiovascular:  HS normal with warm extremities Lungs: Coarse crackles posteriorly are improving.  Tolerated SBT 5/5 comfortably. Abdomen: Soft with active bowel sounds. Musculoskeletal:  No active joints Skin:  Line sites intact, no rashes.      ASSESSMENT AND PLAN    Hypoxemic respiratory failure. MV on PRVC ~ 7lpm. Weaning complicated by sedation requirements.  Improving lung mechanics.  Wean sedation and start SBT.  Will diurese to further improve lung mechanics. Bronchoscopy today to complete work up for ILD done. Passed SBT.  Will interrupt sedation tomorrow and try extubation.   Subacute ILD. Work up in progress. As myasthenic crisis ruled out, so chronic aspiration less likely. CVTS has declined lung biopsy.  Repeat CT chest and BAL as part of autoimmune work up.  ALL cultures negative. Smear negative x2 - off isolation. CVD work-up sent. Given negative microbiological studies, will start trial of steroids.  Myasthenia Gravis. Neurol note appreciated; not in myasthenic crisis and does not need plasma exchange; Imuran off.  Hx recurrent VTE and PE. Xarelto.  Anxiety and high sedation requirements.  Agitated delirium.  Now better controlled. Taper enteral sedatives  SUMMARY OF TODAY'S PLAN:   Continue ventilatory support. Tentative extubation tomorrow.  Best Practice / Goals  of Care / Disposition.   DVT PROPHYLAXIS: Xarelto. NUTRITION: resume feed via post-pyloric tube. MOBILITY: bedrest. GOALS OF CARE:  Early extubation   LABS   Relevant labs and investigations personally reviewed.  Improving leukocytosis.  Mild hyponatremia - likely due to mild volume overload  has corrected   Abdominal XR today: feeding tube in duodenum. .  CXR shows progressive patchy interstitial and alveolar disease - improving.   All respiratory cultures are negative so far with no organisms seen. AFB from deep tracheal aspirate is negative x2   Cell count from bronchoscopy showed small number of neutrophil predominant WBC. Reported organisms, but negative gram stain.  CRITICAL CARE Performed by: Kipp Brood   Total critical care time: 35 minutes  Critical care time was exclusive of separately billable procedures and treating other patients.  Critical care was necessary to treat or prevent imminent or life-threatening deterioration.  Critical care was time spent personally by me on the following activities: development of treatment plan with patient and/or surrogate as well as nursing, discussions with consultants, evaluation of patient's response to treatment, examination of patient, obtaining history from patient or surrogate, ordering and performing treatments and interventions, ordering and review of laboratory studies, ordering and review of radiographic studies, pulse oximetry and re-evaluation of patient's condition.   Kipp Brood, MD [facility] ICU Physician [place]  Pager: [phone] Mobile: [phone] After hours: [phone].

## 2018-02-21 NOTE — Progress Notes (Signed)
PHARMACY - PHYSICIAN COMMUNICATION CRITICAL VALUE ALERT - BLOOD CULTURE IDENTIFICATION (BCID)  Curtis Preston is an 67 y.o. male who [facility] with a chief complaint of PNA  Assessment:  1/2 BC positive for Strep species, likely contaminant  Name of physician (or Provider) ContactedPola Corn: Elink MD, Ogan  Current antibiotics: none  Changes to prescribed antibiotics recommended:  None  Results for orders placed or performed during the hospital encounter of 02/06/18  Blood Culture ID Panel (Reflexed) (Collected: 02/20/2018  3:57 AM)  Result Value Ref Range   Enterococcus species NOT DETECTED NOT DETECTED   Listeria monocytogenes NOT DETECTED NOT DETECTED   Staphylococcus species NOT DETECTED NOT DETECTED   Staphylococcus aureus NOT DETECTED NOT DETECTED   Streptococcus species DETECTED (A) NOT DETECTED   Streptococcus agalactiae NOT DETECTED NOT DETECTED   Streptococcus pneumoniae NOT DETECTED NOT DETECTED   Streptococcus pyogenes NOT DETECTED NOT DETECTED   Acinetobacter baumannii NOT DETECTED NOT DETECTED   Enterobacteriaceae species NOT DETECTED NOT DETECTED   Enterobacter cloacae complex NOT DETECTED NOT DETECTED   Escherichia coli NOT DETECTED NOT DETECTED   Klebsiella oxytoca NOT DETECTED NOT DETECTED   Klebsiella pneumoniae NOT DETECTED NOT DETECTED   Proteus species NOT DETECTED NOT DETECTED   Serratia marcescens NOT DETECTED NOT DETECTED   Haemophilus influenzae NOT DETECTED NOT DETECTED   Neisseria meningitidis NOT DETECTED NOT DETECTED   Pseudomonas aeruginosa NOT DETECTED NOT DETECTED   Candida albicans NOT DETECTED NOT DETECTED   Candida glabrata NOT DETECTED NOT DETECTED   Candida krusei NOT DETECTED NOT DETECTED   Candida parapsilosis NOT DETECTED NOT DETECTED   Candida tropicalis NOT DETECTED NOT DETECTED    [name], Curtis Abboud Poteet 02/21/2018  4:11 AM

## 2018-02-21 NOTE — Plan of Care (Signed)
  Problem: Clinical Measurements: Goal: Will remain free from infection Outcome: Progressing Goal: Diagnostic test results will improve Outcome: Progressing Goal: Respiratory complications will improve Outcome: Progressing Goal: Cardiovascular complication will be avoided Outcome: Progressing   Problem: Nutrition: Goal: Adequate nutrition will be maintained Outcome: Progressing   Problem: Coping: Goal: Level of anxiety will decrease Outcome: Progressing   

## 2018-02-21 NOTE — Progress Notes (Signed)
eLink Physician-Brief Progress Note Patient Name: Quintella Batonerry Michael Cleckler DOB: 01/29/1951 MRN: [id number]   Date of Service  02/21/2018  HPI/Events of Note  One of two blood culture + for strept species. No stigmata of active streptococcal infection  eICU Interventions  No new antibiotics for now, repeat cultures.        Thomasene Lotkoronkwo U Leiland Mihelich 02/21/2018, 5:43 AM

## 2018-02-22 ENCOUNTER — Inpatient Hospital Stay (HOSPITAL_COMMUNITY): Payer: Medicare HMO

## 2018-02-22 LAB — BASIC METABOLIC PANEL
Anion gap: 10 (ref 5–15)
BUN: 38 mg/dL — AB (ref 8–23)
CALCIUM: 8 mg/dL — AB (ref 8.9–10.3)
CO2: 30 mmol/L (ref 22–32)
Chloride: 104 mmol/L (ref 98–111)
Creatinine, Ser: 0.63 mg/dL (ref 0.61–1.24)
GFR calc Af Amer: >60 mL/min (ref >60)
Glucose, Bld: 111 mg/dL — ABNORMAL HIGH (ref 70–99)
POTASSIUM: 4 mmol/L (ref 3.5–5.1)
SODIUM: 144 mmol/L (ref 135–145)

## 2018-02-22 LAB — GLUCOSE, CAPILLARY
GLUCOSE-CAPILLARY: 104 mg/dL — AB (ref 70–99)
GLUCOSE-CAPILLARY: 111 mg/dL — AB (ref 70–99)
GLUCOSE-CAPILLARY: 85 mg/dL (ref 70–99)
Glucose-Capillary: 111 mg/dL — ABNORMAL HIGH (ref 70–99)
Glucose-Capillary: 129 mg/dL — ABNORMAL HIGH (ref 70–99)
Glucose-Capillary: 141 mg/dL — ABNORMAL HIGH (ref 70–99)
Glucose-Capillary: 92 mg/dL (ref 70–99)

## 2018-02-22 LAB — CBC
HEMATOCRIT: 32.9 % — AB (ref 39.0–52.0)
Hemoglobin: 10.4 g/dL — ABNORMAL LOW (ref 13.0–17.0)
MCH: 31.5 pg (ref 26.0–34.0)
MCHC: 31.6 g/dL (ref 30.0–36.0)
MCV: 99.7 fL (ref 78.0–100.0)
PLATELETS: 541 10*3/uL — AB (ref 150–400)
RBC: 3.3 MIL/uL — ABNORMAL LOW (ref 4.22–5.81)
RDW: 13.5 % (ref 11.5–15.5)
WBC: 16.6 10*3/uL — AB (ref 4.0–10.5)

## 2018-02-22 LAB — MPO/PR-3 (ANCA) ANTIBODIES
ANCA Proteinase 3: <3.5 U/mL (ref 0.0–3.5)
Myeloperoxidase Abs: <9 U/mL (ref 0.0–9.0)

## 2018-02-22 LAB — MAGNESIUM: MAGNESIUM: 1.9 mg/dL (ref 1.7–2.4)

## 2018-02-22 MED ORDER — ORAL CARE MOUTH RINSE
15.0000 mL | Freq: Two times a day (BID) | OROMUCOSAL | Status: DC
  Administered 2018-02-22 – 2018-03-05 (×16): 15 mL via OROMUCOSAL

## 2018-02-22 MED ORDER — MIDAZOLAM HCL 2 MG/2ML IJ SOLN
0.5000 mg | Freq: Once | INTRAMUSCULAR | Status: AC
  Administered 2018-02-22: 0.5 mg via INTRAVENOUS
  Filled 2018-02-22: qty 2

## 2018-02-22 MED ORDER — FAMOTIDINE IN NACL 20-0.9 MG/50ML-% IV SOLN
20.0000 mg | Freq: Two times a day (BID) | INTRAVENOUS | Status: DC
  Administered 2018-02-22 – 2018-02-24 (×4): 20 mg via INTRAVENOUS
  Filled 2018-02-22 (×4): qty 50

## 2018-02-22 MED ORDER — PYRIDOSTIGMINE BROMIDE 10 MG/2ML IV SOLN
1.0000 mg | Freq: Four times a day (QID) | INTRAVENOUS | Status: AC
  Administered 2018-02-22 – 2018-02-23 (×2): 1 mg via INTRAVENOUS
  Filled 2018-02-22 (×2): qty 0.2

## 2018-02-22 NOTE — Progress Notes (Signed)
eLink Physician-Brief Progress Note Patient Name: Curtis Preston Michael Diebold DOB: 11/22/1950 MRN: [id number]   Date of Service  02/22/2018  HPI/Events of Note  Nurse unable to place gastric tube via NG or OG approaches. Request for sedation for further attempts.   eICU Interventions  Will order: 1. Versed 0.5 mg IV X 1 to facilitate gastric tube placement.      Intervention Category Major Interventions: Other:  Cherita Hebel Dennard Nipugene 02/22/2018, 10:14 PM

## 2018-02-22 NOTE — Progress Notes (Signed)
Versed 20mg   and Fentanyl 50 mg wasted in sink. Witnessed by Reather LittlerIreka Baynes.

## 2018-02-22 NOTE — Progress Notes (Signed)
eLink Physician-Brief Progress Note Patient Name: Quintella Batonerry Michael Josey DOB: 10/17/1950 MRN: [id number]   Date of Service  02/22/2018  HPI/Events of Note  Bedside nurse unable to place gastric tube. CorTrak ordered for AM. Review of medications reveals Pepcid, Mestinon and Lesia HausenXaralto that I am concerned that he gets. Last Xaralto dose was at 5 PM, thereforre, he should be OK until Cortrak replaced.   eICU Interventions  Will order: 1. Change Pepcid PO to Pepcid IV.  2. Change Mestinon 30 mg PO Q 6 hours  to Mestinon 1 mg IV Q 6 hours X 2 doses.      Intervention Category Major Interventions: Other:  Lenell AntuSommer,Steven Eugene 02/22/2018, 11:28 PM

## 2018-02-22 NOTE — Progress Notes (Signed)
Pt vomited large amount of blood-tinged yellow emesis. RN noted cortrak was still in nare but was also hanging out of his mouth. MD notified. Verbal order to leave out cortrak and keep patient NPO. MD will round for further discussion of patient care. Pt stable current VS HR 88 02 95% on 6L and BP 127/87.

## 2018-02-22 NOTE — Progress Notes (Signed)
PULMONARY / CRITICAL CARE MEDICINE   NAME:  Curtis Preston, MRN:  [id number], DOB:  November 14, 1950, LOS: 18 ADMISSION DATE:  02/06/2018, CONSULTATION DATE:  02/09/2018 REFERRING MD:  Algis Liming, CHIEF COMPLAINT:  PNA in an immunocompromised patient  BRIEF HISTORY:    67 year old male with PMH as below, which is significant for Myasthenia gravis on imuran, COPD, PE on Xarelto, and CAD. He was in his usual state of health (gets winded daily) until about early August of this year, when he developed worsening shortness of breath with associated productive cough (green sputum). This persisted for about 6 weeks, until about 9/6, when his symptoms significant worsened after traveling here to [place] from [place] (where he lives). SOB worsened as did his productive cough, which caused him to present to ED. Workup in ED suggested pneumonia and he was started on empiric antibiotics. CT demonstrated bilateral ground glass, and most concerning for multi-focal pneumonia. Infectious disease consultation sees benefit in bronchoscopy for culture. PCCM consulted and recommended transfer to the intensive care unit for monitoring of respiratory status.   SIGNIFICANT EVENTS:   Remains intubated with periods of agitation. Tolerating PSV.  Re-intubated for hypoxia 9/15.  Subsequent self-extubation, but not able to manage secretions, so intubated again.   Repeat bronchoscopy to complete work-up performed.  SUBJECTIVE:    Continues to require iv sedation now that enteral doses decreased.  Awakes readily. Passed SBT.    CONSTITUTIONAL: BP 138/79   Pulse 85   Temp 98 F (36.7 C) (Oral)   Resp (!) 22   Ht _0  (1.854 m)   Wt 76.6 kg   SpO2 95%   BMI 22.28 kg/m   I/O last 3 completed shifts: In: 2274.7 [I.V.:584.7; NG/GT:1690] Out: 1990 [Urine:1990]    Vent Mode: PRVC FiO2 (%):  [40 %] 40 % Set Rate:  [16 bmp] 16 bmp Vt Set:  [640 mL] 640 mL PEEP:  [5 cmH20] 5 cmH20 Pressure Support:  [5 cmH20-10 cmH20]  5 cmH20 Plateau Pressure:  [16 cmH20] 16 cmH20  PHYSICAL EXAM: General:  Thin man with generally reduced muscle mass. Neuro:  No focal findings - sedated but easily arousable. Full strength when agitated. HEENT:   ETT/OGT in place with no skin breakdown. Cardiovascular:  HS normal with warm extremities Lungs: Coarse crackles posteriorly are improving.  Tolerated SBT 5/5 comfortably. Abdomen: Soft with active bowel sounds. Musculoskeletal:  No active joints Skin:  Line sites intact, no rashes.      ASSESSMENT AND PLAN    Hypoxemic respiratory failure. MV on PRVC ~ 7lpm. Weaning complicated by sedation requirements.  Improving lung mechanics.  Wean sedation and start SBT.  Will diurese to further improve lung mechanics. Bronchoscopy today to complete work up for ILD done.  Today, after 1h 5/5 SBT. All sedation was stopped and patient was extubated once mental status allowed.  Subacute ILD. Work up in progress. As myasthenic crisis ruled out, so chronic aspiration less likely. CVTS has declined lung biopsy.  Repeat CT chest and BAL as part of autoimmune work up.  ALL cultures negative. Smear negative x2 - off isolation. CVD work-up sent. Given negative microbiological studies, have started trial  of steroids.  Myasthenia Gravis. Neurol note appreciated; not in myasthenic crisis and does not need plasma exchange; Imuran off.  Hx recurrent VTE and PE. Xarelto.  Anxiety and high sedation requirements.  Agitated delirium.  Calm and breathing comfortably post extubation. Oral sedatives stopped.  SUMMARY OF TODAY'S PLAN:   Extubate  today./  Best Practice / Goals of Care / Disposition.   DVT PROPHYLAXIS: Xarelto. NUTRITION: resume feed via post-pyloric tube. MOBILITY: bedrest. GOALS OF CARE:  Early extubation   LABS   Relevant labs and investigations personally reviewed.  Leukocytosis has increased likely due to steroids.  Mild hyponatremia - likely due to mild volume overload  has corrected   Abdominal XR today: feeding tube in duodenum. .  CXR shows progressive patchy interstitial and alveolar disease - improving.   All respiratory cultures are negative so far with no organisms seen. AFB from deep tracheal aspirate is negative x2   Cell count from bronchoscopy showed small number of neutrophil predominant WBC. Reported organisms, but negative gram stain.  CRITICAL CARE Performed by: Kipp Brood   Total critical care time: 35 minutes  Critical care time was exclusive of separately billable procedures and treating other patients.  Critical care was necessary to treat or prevent imminent or life-threatening deterioration.  Critical care was time spent personally by me on the following activities: development of treatment plan with patient and/or surrogate as well as nursing, discussions with consultants, evaluation of patient's response to treatment, examination of patient, obtaining history from patient or surrogate, ordering and performing treatments and interventions, ordering and review of laboratory studies, ordering and review of radiographic studies, pulse oximetry and re-evaluation of patient's condition.   Kipp Brood, MD [facility] ICU Physician [place]  Pager: [phone] Mobile: [phone] After hours: [phone].

## 2018-02-22 NOTE — Procedures (Signed)
Extubation Procedure Note  Patient Details:   Name: Quintella Batonerry Michael Gaspar DOB: 10/26/1950 MRN: [id number]   Airway Documentation:    Vent end date: 02/22/18 Vent end time: 1507   Evaluation  O2 sats: stable throughout Complications: No apparent complications Patient did tolerate procedure well. Bilateral Breath Sounds: Diminished   Yes   Patient was extubated to 6L [place]. Cuff leak was heard. No stridor was noted. RN was at the bedside with RT during extubation. Patient tolerated well.  Darolyn Ruashley M Braylee Lal 02/22/2018, 3:18 PM

## 2018-02-23 ENCOUNTER — Inpatient Hospital Stay (HOSPITAL_COMMUNITY): Payer: Medicare HMO

## 2018-02-23 LAB — GLUCOSE, CAPILLARY
GLUCOSE-CAPILLARY: 63 mg/dL — AB (ref 70–99)
GLUCOSE-CAPILLARY: 98 mg/dL (ref 70–99)
Glucose-Capillary: 76 mg/dL (ref 70–99)
Glucose-Capillary: 75 mg/dL (ref 70–99)
Glucose-Capillary: 83 mg/dL (ref 70–99)
Glucose-Capillary: 126 mg/dL — ABNORMAL HIGH (ref 70–99)

## 2018-02-23 LAB — ANGIOTENSIN CONVERTING ENZYME: ANGIOTENSIN-CONVERTING ENZYME: 49 U/L (ref 14–82)

## 2018-02-23 LAB — ANCA TITERS
Atypical P-ANCA titer: <1:20 {titer}
C-ANCA: <1:20 {titer}
P-ANCA: <1:20 {titer}

## 2018-02-23 LAB — GLOMERULAR BASEMENT MEMBRANE ANTIBODIES: GBM AB: 3 U (ref 0–20)

## 2018-02-23 MED ORDER — ENSURE ENLIVE PO LIQD
237.0000 mL | Freq: Three times a day (TID) | ORAL | Status: DC
  Administered 2018-02-23 – 2018-03-05 (×25): 237 mL via ORAL

## 2018-02-23 NOTE — Progress Notes (Signed)
Initial Nutrition Assessment  DOCUMENTATION CODES:   Non-severe (moderate) malnutrition in context of chronic illness  INTERVENTION:    Regular diet  Ensure Enlive po TID, each supplement provides 350 kcal and 20 grams of protein  D/C Vital AF 1.2 and Pro-stat  NUTRITION DIAGNOSIS:   Moderate Malnutrition related to chronic illness(MG, COPD) as evidenced by moderate fat depletion, moderate muscle depletion.  Ongoing  GOAL:   Patient will meet greater than or equal to 90% of their needs  Unmet  MONITOR:   PO intake, Supplement acceptance  REASON FOR ASSESSMENT:   Consult, Ventilator Enteral/tube feeding initiation and management  ASSESSMENT:   Pt with PMH of myasthenia gravis on imuran, COPD, PE on Xarelto, and CAD. Pt was admitted 9/6 after 6 weeks of productive cough now tx to ICU 9/13 with acute hypoxemic respiratory failure secondary to CAP in an immunosuppressed pt with possible atypical organism.   Patient was extubated and Cortrak tube removed 9/22. TF has been off since then.   Patient did not wake up when name called x 2. Did not disturb patient. RN reports he was awake this morning and did great drinking water. S/P MBS with SLP this morning; SLP recommends regular consistency solids with thin liquids.  Patient with increased nutrient needs for repletion; would benefit from PO supplementation.   Labs reviewed. Medications reviewed and include Oscal with D, Remeron, Mestinon, vitamin B-12.   Diet Order:   Diet Order            Diet regular Room service appropriate? Yes; Fluid consistency: Thin  Diet effective now              EDUCATION NEEDS:   No education needs have been identified at this time  Skin:  Skin Assessment: Reviewed RN Assessment  Last BM:  9/23 (type 7)  Height:   Ht Readings from Last 1 Encounters:  02/09/18 6\' 1"  (1.854 m)    Weight:   Wt Readings from Last 1 Encounters:  02/23/18 74 kg    Ideal Body Weight:  83.6  kg  BMI:  Body mass index is 21.52 kg/m.  Estimated Nutritional Needs:   Kcal:  2100-2300  Protein:  105-125 gm  Fluid:  > 2 L     Joaquin CourtsKimberly Joory Gough, RD, LDN, CNSC Pager [phone] After Hours Pager [phone]

## 2018-02-23 NOTE — Progress Notes (Signed)
Per ST, pt did very well w/ modified barium swallow and may have whatever diet it medically appropriate. Will put in order for heart healthy diet

## 2018-02-23 NOTE — Progress Notes (Signed)
Modified Barium Swallow Progress Note  Patient Details  Name: Curtis Preston MRN: [id number] Date of Birth: 12/06/1950  Today's Date: 02/23/2018  Modified Barium Swallow completed.  Full report located under Chart Review in the Imaging Section.  Brief recommendations include the following:  Clinical Impression  Pt presents with functional oropharyngeal swallowing.  There was trace, transient laryngeal penetration during the swallow with serial straw sips of thin liquid, which was spontaneously cleared.  Pt exhibited prompt and efficient mastication and oral transit of solids.  With pill simulation there was no stasis of tablet or increase penetration.  Tablet fully cleared on esophageal sweep.  Recommend regular texture diet with thin liquid.   Swallow Evaluation Recommendations       SLP Diet Recommendations: Regular solids;Thin liquid   Liquid Administration via: Cup;Straw   Medication Administration: Whole meds with liquid   Supervision: Full assist for feeding   Compensations: Minimize environmental distractions;Slow rate;Small sips/bites   Postural Changes: Seated upright at 90 degrees   Oral Care Recommendations: Oral care BID        Kerrie PleasureLeigh E Freman Lapage, MA, CCC-SLP Acute Rehabilitation Services Office: [phone] 02/23/2018,11:31 AM

## 2018-02-23 NOTE — Progress Notes (Signed)
Per night shift RN, pt pulled out cortrak yesterday, failed bedside swallow eval, tried 4x to insert NG tube w/out success.  Order for ST eval put in this AM

## 2018-02-23 NOTE — Evaluation (Signed)
Clinical/Bedside Swallow Evaluation Patient Details  Name: Curtis Preston MRN: [id number] Date of Birth: 05/18/1951  Today's Date: 02/23/2018 Time: SLP Start Time (ACUTE ONLY): 0908 SLP Stop Time (ACUTE ONLY): 0916 SLP Time Calculation (min) (ACUTE ONLY): 8 min  Past Medical History:  Past Medical History:  Diagnosis Date  . Bronchiectasis (HCC)   . CAD (coronary artery disease)   . COPD (chronic obstructive pulmonary disease) (HCC)   . Depression with anxiety   . GERD (gastroesophageal reflux disease)   . Hypothyroidism   . Myasthenia gravis (HCC)   . PE (pulmonary thromboembolism) (HCC)   . Sjogren's disease (HCC)   . Tobacco abuse    Past Surgical History:  Past Surgical History:  Procedure Laterality Date  . Right ulnar nerve impingement     HPI:  67 year old male with PMH as below, which is significant for Myasthenia gravis on imuran, COPD, PE on Xarelto, and CAD.  He presented to ED with worsening shortness of breath and workup revealed pneumonia.  Pt required multiple intubations across 9 days with one self-extubation.   Assessment / Plan / Recommendation Clinical Impression  Pt presents with moderate risk for aspiration in setting of multifocal pneumonia with prolonged intubation (9 days across 4 intubations with self-extubation).  Low vocal intensity and very mild dysphonic vocal quality noted.  Oral phase WFL for all consistencies trialed.  Pt tolerated ice chip with no clinical s/s of aspiration.  With thin liquid there was intermittent cough and multiple swallows.  Following puree trial there was delayed cough and desaturation to 79% SpO2 with very quick recover to 98%.  Pt tolerated regular solid with no clinical s/s of aspiration and good oral clearance.  Given pt hx and clinical presentation recommend instrumental swallow study prior to initiation of PO diet.  Pt declined FEES, but was agreeable to MBSS.  Will attempt MBSS this date. SLP Visit Diagnosis: Dysphagia,  oropharyngeal phase (R13.12)    Aspiration Risk  Moderate aspiration risk    Diet Recommendation NPO        Other  Recommendations Oral Care Recommendations: Oral care BID     Swallow Study   General Date of Onset: 02/06/18 HPI: 67 year old male with PMH as below, which is significant for Myasthenia gravis on imuran, COPD, PE on Xarelto, and CAD.  He presented to ED with worsening shortness of breath and workup revealed pneumonia.  Pt required multiple intubations across 9 days with one self-extubation. Type of Study: Bedside Swallow Evaluation Diet Prior to this Study: NPO Temperature Spikes Noted: No Respiratory Status: Nasal cannula History of Recent Intubation: Yes Length of Intubations (days): 9 days(4 intubations with 1 self-extubation) Date extubated: 02/22/18 Behavior/Cognition: Alert;Cooperative;Pleasant mood Oral Cavity Assessment: Within Functional Limits Oral Cavity - Dentition: Adequate natural dentition Patient Positioning: Upright in bed Baseline Vocal Quality: Low vocal intensity Volitional Cough: Strong Volitional Swallow: Able to elicit    Oral/Motor/Sensory Function Overall Oral Motor/Sensory Function: Within functional limits   Ice Chips Ice chips: Within functional limits Presentation: Spoon   Thin Liquid Thin Liquid: Impaired Presentation: Cup;Spoon Pharyngeal  Phase Impairments: Throat Clearing - Immediate;Cough - Immediate;Multiple swallows    Nectar Thick Nectar Thick Liquid: Not tested   Honey Thick Honey Thick Liquid: Not tested   Puree Puree: Impaired Pharyngeal Phase Impairments: Cough - Delayed;Change in Vital Signs   Solid     Solid: Within functional limits Presentation: (SLP fed)      Annell GreeningLeigh E Kadejah Sandiford, MA, CCC-SLP Acute Rehabilitation  Services Office: [phone] 02/23/2018,9:38 AM

## 2018-02-23 NOTE — Progress Notes (Signed)
PULMONARY / CRITICAL CARE MEDICINE   NAME:  Curtis Preston, MRN:  [id number], DOB:  10-24-1950, LOS: 62 ADMISSION DATE:  02/06/2018, CONSULTATION DATE:  02/09/2018 REFERRING MD:  Algis Liming, CHIEF COMPLAINT:  PNA in an immunocompromised patient  BRIEF HISTORY:    67 year old male with PMH as below, which is significant for Myasthenia gravis on imuran, COPD, PE on Xarelto, and CAD. He was in his usual state of health (gets winded daily) until about early August of this year, when he developed worsening shortness of breath with associated productive cough (green sputum). This persisted for about 6 weeks, until about 9/6, when his symptoms significant worsened after traveling here to [place] from [place] (where he lives). SOB worsened as did his productive cough, which caused him to present to ED. Workup in ED suggested pneumonia and he was started on empiric antibiotics. CT demonstrated bilateral ground glass, and most concerning for multi-focal pneumonia. Infectious disease consultation sees benefit in bronchoscopy for culture. PCCM consulted and recommended transfer to the intensive care unit for monitoring of respiratory status.   SIGNIFICANT EVENTS:   Extubated 9/22 but very lethargic 10/23  SUBJECTIVE:    No events overnight, remains very lethargic this AM  CONSTITUTIONAL: BP (!) 145/71   Pulse 77   Temp 98.6 F (37 C) (Oral)   Resp (!) 26   Ht _0  (1.854 m)   Wt 74 kg   SpO2 100%   BMI 21.52 kg/m   I/O last 3 completed shifts: In: 1497 [I.V.:477; NG/GT:970; IV Piggyback:50] Out: 2335 [Urine:2335]    Vent Mode: PRVC FiO2 (%):  [40 %] 40 % Set Rate:  [16 bmp] 16 bmp Vt Set:  [640 mL] 640 mL PEEP:  [5 cmH20] 5 cmH20 Plateau Pressure:  [16 cmH20] 16 cmH20  PHYSICAL EXAM: General:  Thin man with reduced muscle mass Neuro:  Lethargic, moving all ext to command HEENT:   [place]/AT, PERRL, EOM-I and MMM Cardiovascular:  RRR, Nl S1/S2 and -M/R/G Lungs: Diffuse crackles Abdomen:  Soft, NT, ND and +BS Musculoskeletal:  -edema and -tenderness Skin:  Line sites intact, no rashes.      ASSESSMENT AND PLAN    Hypoxemic respiratory failure. MV on PRVC ~ 7lpm. Weaning complicated by sedation requirements.  Improving lung mechanics.  Minimize sedation Monitor closely in the ICU for airway protection, level of lethargy is very concerning, hold in the ICU Titrate O2 for sat of 88-92%  Subacute ILD. Work up in progress. As myasthenic crisis ruled out, so chronic aspiration less likely. CVTS has declined lung biopsy.  Repeat CT chest and BAL as part of autoimmune work up.  ALL cultures negative. Smear negative x2 - off isolation. Given negative microbiological studies, have started trial  of steroids, maintain on steroids for now  Myasthenia Gravis. Neurol note appreciated; not in myasthenic crisis and does not need plasma exchange; Imuran off.  Hx recurrent VTE and PE. Xarelto.  Anxiety and high sedation requirements.  Agitated delirium.  Lethargic this AM  SUMMARY OF TODAY'S PLAN:   Extubated, keep in the ICU for monitoring, I am concerned about airway protection  Best Practice / Goals of Care / Disposition.   DVT PROPHYLAXIS: Xarelto. NUTRITION: resume feed via post-pyloric tube. MOBILITY: bedrest. GOALS OF CARE:  Early extubation   LABS   Relevant labs and investigations personally reviewed.  Leukocytosis has increased likely due to steroids.  Mild hyponatremia - likely due to mild volume overload has corrected   Abdominal XR today:  feeding tube in duodenum. .  CXR shows progressive patchy interstitial and alveolar disease - improving.   All respiratory cultures are negative so far with no organisms seen. AFB from deep tracheal aspirate is negative x2   Cell count from bronchoscopy showed small number of neutrophil predominant WBC. Reported organisms, but negative gram stain.  The patient is critically ill with multiple organ systems failure and  requires high complexity decision making for assessment and support, frequent evaluation and titration of therapies, application of advanced monitoring technologies and extensive interpretation of multiple databases.   Critical Care Time devoted to patient care services described in this note is  33  Minutes. This time reflects time of care of this signee Dr [name]. This critical care time does not reflect procedure time, or teaching time or supervisory time of PA/NP/Med student/Med Resident etc but could involve care discussion time.  Rush Farmer, M.D. [facility] Pulmonary/Critical Care Medicine. Pager: [phone]. After hours pager: [phone].

## 2018-02-24 DIAGNOSIS — G9340 Encephalopathy, unspecified: Secondary | ICD-10-CM

## 2018-02-24 LAB — MISC LABCORP TEST (SEND OUT): LABCORP TEST CODE: 9035

## 2018-02-24 LAB — CBC
HCT: 30.5 % — ABNORMAL LOW (ref 39.0–52.0)
HEMOGLOBIN: 9.8 g/dL — AB (ref 13.0–17.0)
MCH: 31.6 pg (ref 26.0–34.0)
MCHC: 32.1 g/dL (ref 30.0–36.0)
MCV: 98.4 fL (ref 78.0–100.0)
PLATELETS: 560 10*3/uL — AB (ref 150–400)
RBC: 3.1 MIL/uL — ABNORMAL LOW (ref 4.22–5.81)
RDW: 12.8 % (ref 11.5–15.5)
WBC: 10.5 10*3/uL (ref 4.0–10.5)

## 2018-02-24 LAB — GLUCOSE, CAPILLARY
GLUCOSE-CAPILLARY: 91 mg/dL (ref 70–99)
GLUCOSE-CAPILLARY: 83 mg/dL (ref 70–99)
GLUCOSE-CAPILLARY: 142 mg/dL — AB (ref 70–99)
GLUCOSE-CAPILLARY: 128 mg/dL — AB (ref 70–99)
Glucose-Capillary: 146 mg/dL — ABNORMAL HIGH (ref 70–99)
Glucose-Capillary: 67 mg/dL — ABNORMAL LOW (ref 70–99)
Glucose-Capillary: 76 mg/dL (ref 70–99)

## 2018-02-24 LAB — BASIC METABOLIC PANEL
Anion gap: 10 (ref 5–15)
BUN: 27 mg/dL — AB (ref 8–23)
CO2: 28 mmol/L (ref 22–32)
CREATININE: 0.68 mg/dL (ref 0.61–1.24)
Calcium: 8.1 mg/dL — ABNORMAL LOW (ref 8.9–10.3)
Chloride: 105 mmol/L (ref 98–111)
Glucose, Bld: 97 mg/dL (ref 70–99)
POTASSIUM: 3.2 mmol/L — AB (ref 3.5–5.1)
SODIUM: 143 mmol/L (ref 135–145)

## 2018-02-24 LAB — PHOSPHORUS: PHOSPHORUS: 3 mg/dL (ref 2.5–4.6)

## 2018-02-24 LAB — CULTURE, BLOOD (ROUTINE X 2): SPECIAL REQUESTS: ADEQUATE

## 2018-02-24 LAB — MAGNESIUM: MAGNESIUM: 1.9 mg/dL (ref 1.7–2.4)

## 2018-02-24 MED ORDER — MONTELUKAST SODIUM 10 MG PO TABS
10.0000 mg | ORAL_TABLET | Freq: Every day | ORAL | Status: DC
  Administered 2018-02-24 – 2018-03-04 (×9): 10 mg via ORAL
  Filled 2018-02-24 (×10): qty 1

## 2018-02-24 MED ORDER — ASPIRIN 81 MG PO CHEW
81.0000 mg | CHEWABLE_TABLET | Freq: Every day | ORAL | Status: DC
  Administered 2018-02-25 – 2018-03-05 (×9): 81 mg via ORAL
  Filled 2018-02-24 (×9): qty 1

## 2018-02-24 MED ORDER — BACLOFEN 10 MG PO TABS
10.0000 mg | ORAL_TABLET | Freq: Three times a day (TID) | ORAL | Status: DC
  Administered 2018-02-24 – 2018-03-05 (×19): 10 mg via ORAL
  Filled 2018-02-24 (×25): qty 1

## 2018-02-24 MED ORDER — LEVOTHYROXINE SODIUM 75 MCG PO TABS
175.0000 ug | ORAL_TABLET | Freq: Every day | ORAL | Status: DC
  Administered 2018-02-25 – 2018-03-05 (×9): 175 ug via ORAL
  Filled 2018-02-24 (×9): qty 1

## 2018-02-24 MED ORDER — MORPHINE SULFATE (CONCENTRATE) 10 MG/0.5ML PO SOLN: 5.0000 mg | Freq: Four times a day (QID) | ORAL | Status: DC | PRN

## 2018-02-24 MED ORDER — RIVAROXABAN 20 MG PO TABS
20.0000 mg | ORAL_TABLET | Freq: Every day | ORAL | Status: DC
  Administered 2018-02-24 – 2018-03-04 (×9): 20 mg via ORAL
  Filled 2018-02-24 (×9): qty 1

## 2018-02-24 MED ORDER — ATORVASTATIN CALCIUM 40 MG PO TABS
40.0000 mg | ORAL_TABLET | Freq: Every day | ORAL | Status: DC
  Administered 2018-02-24 – 2018-03-04 (×9): 40 mg via ORAL
  Filled 2018-02-24 (×10): qty 1

## 2018-02-24 MED ORDER — VITAMIN B-12 1000 MCG PO TABS
1000.0000 ug | ORAL_TABLET | Freq: Every day | ORAL | Status: DC
  Administered 2018-02-25 – 2018-03-05 (×9): 1000 ug via ORAL
  Filled 2018-02-24 (×9): qty 1

## 2018-02-24 MED ORDER — MIRTAZAPINE 15 MG PO TBDP
15.0000 mg | ORAL_TABLET | Freq: Every day | ORAL | Status: DC
  Administered 2018-02-24 – 2018-03-04 (×8): 15 mg via ORAL
  Filled 2018-02-24 (×11): qty 1

## 2018-02-24 MED ORDER — SENNOSIDES-DOCUSATE SODIUM 8.6-50 MG PO TABS
1.0000 | ORAL_TABLET | Freq: Two times a day (BID) | ORAL | Status: DC
  Administered 2018-02-26 – 2018-03-05 (×5): 1 via ORAL
  Filled 2018-02-24 (×19): qty 1

## 2018-02-24 MED ORDER — CALCIUM CARBONATE-VITAMIN D 500-200 MG-UNIT PO TABS
1.0000 | ORAL_TABLET | Freq: Every day | ORAL | Status: DC
  Administered 2018-02-25 – 2018-03-05 (×9): 1 via ORAL
  Filled 2018-02-24 (×9): qty 1

## 2018-02-24 MED ORDER — POTASSIUM CHLORIDE CRYS ER 20 MEQ PO TBCR
30.0000 meq | EXTENDED_RELEASE_TABLET | ORAL | Status: AC
  Administered 2018-02-24 (×2): 30 meq via ORAL
  Filled 2018-02-24 (×2): qty 1

## 2018-02-24 MED ORDER — ACETAMINOPHEN 160 MG/5ML PO SOLN: 650.0000 mg | Freq: Four times a day (QID) | ORAL | Status: DC | PRN

## 2018-02-24 MED ORDER — POLYETHYLENE GLYCOL 3350 17 G PO PACK
17.0000 g | PACK | Freq: Two times a day (BID) | ORAL | Status: DC | PRN
  Filled 2018-02-24: qty 1

## 2018-02-24 MED ORDER — FAMOTIDINE 20 MG PO TABS
20.0000 mg | ORAL_TABLET | Freq: Two times a day (BID) | ORAL | Status: DC
  Administered 2018-02-24 – 2018-02-25 (×2): 20 mg via ORAL
  Filled 2018-02-24 (×2): qty 1

## 2018-02-24 MED ORDER — PREGABALIN 25 MG PO CAPS
50.0000 mg | ORAL_CAPSULE | Freq: Every day | ORAL | Status: DC
  Administered 2018-02-24 – 2018-03-04 (×9): 50 mg via ORAL
  Filled 2018-02-24 (×3): qty 2
  Filled 2018-02-24: qty 1
  Filled 2018-02-24 (×4): qty 2
  Filled 2018-02-24: qty 1

## 2018-02-24 MED ORDER — PYRIDOSTIGMINE BROMIDE 60 MG PO TABS
30.0000 mg | ORAL_TABLET | Freq: Four times a day (QID) | ORAL | Status: DC
  Administered 2018-02-24 – 2018-03-05 (×34): 30 mg via ORAL
  Filled 2018-02-24 (×2): qty 1
  Filled 2018-02-24 (×2): qty 0.5
  Filled 2018-02-24: qty 1
  Filled 2018-02-24 (×2): qty 0.5
  Filled 2018-02-24 (×3): qty 1
  Filled 2018-02-24: qty 0.5
  Filled 2018-02-24 (×7): qty 1
  Filled 2018-02-24 (×2): qty 0.5
  Filled 2018-02-24 (×6): qty 1
  Filled 2018-02-24: qty 0.5
  Filled 2018-02-24 (×4): qty 1
  Filled 2018-02-24: qty 0.5
  Filled 2018-02-24 (×3): qty 1

## 2018-02-24 NOTE — Progress Notes (Signed)
Hypoglycemic Event  CBG: 63  Treatment: 15 GM carbohydrate snack  Symptoms: None  Follow-up CBG: Time:0012  CBG Result:76  Possible Reasons for Event: Inadequate meal intake      Okey DupreJennifer  Kayona Foor

## 2018-02-24 NOTE — Progress Notes (Signed)
PULMONARY / CRITICAL CARE MEDICINE   NAME:  Curtis Preston, MRN:  [id number], DOB:  07/11/1950, LOS: 16 ADMISSION DATE:  02/06/2018, CONSULTATION DATE:  02/09/2018 REFERRING MD:  Waymon AmatoHongalgi, CHIEF COMPLAINT:  PNA in an immunocompromised patient  BRIEF HISTORY:    67 year old male with PMH as below, which is significant for Myasthenia gravis on imuran, COPD, PE on Xarelto, and CAD. He was in his usual state of health (gets winded daily) until about early August of this year, when he developed worsening shortness of breath with associated productive cough (green sputum). This persisted for about 6 weeks, until about 9/6, when his symptoms significant worsened after traveling here to [place] ([place] lives[place] SOB worsened as did his productive cough, which caused him to present to ED. Workup in ED suggested pneumonia and he was started on empiric antibiotics. CT demonstrated bilateral ground glass, and most concerning for multi-focal pneumonia. Infectious disease consultation sees benefit in bronchoscopy for culture. PCCM consulted and recommended transfer to the intensive care unit for monitoring of respiratory status.   SIGNIFICANT EVENTS:   Extubated 9/22 but very lethargic 10/23  SUBJECTIVE:    No acute events overnight.  Remains extubated on minimal oxygen with a respiratory rate of 27.  CONSTITUTIONAL: BP 132/70   Pulse 64   Temp (!) 97.4 F (36.3 C) (Axillary)   Resp (!) 23   Ht 6\' 1"  (1.854 m)   Wt 77.2 kg   SpO2 100%   BMI 22.45 kg/m   I/O last 3 completed shifts: In: 2856.9 [P.O.:2400; I.V.:331.8; IV Piggyback:125.1] Out: 1920 [Urine:1920]       PHYSICAL EXAM: General: Lethargic male who answers questions has insight into current issues HEENT: No JVD or lymphadenopathy is appreciated Neuro: Awake follows commands able to interact speech is clear generalized weakness without lethargy CV: s1s2 rrr, no m/r/g PULM: even/non-labored, lungs bilaterally  diminished WU:JWJXGI:soft, non-tender, bsx4 active  Extremities: warm/dry, negative edema  Skin: no rashes or lesions      ASSESSMENT AND PLAN    Hypoxemic respiratory failure.  Currently extubated Minimal O2 requirements Respiratory rate is 27 He is lethargic suspect part of this is iatrogenic from multiple sedating medications which have been decreased or discontinued He is ready to move out to stepdown unit and to [facility] service within 24 hours   Subacute ILD.  Continue work-up Repeat chest CT if needed  Myasthenia Gravis. Neurology note appreciated Continue current treatment  Hx recurrent VTE and PE.  Continue Xarelto  Anxiety and high sedation requirements.   Lethargic 02/24/2018 morphine has been changed to as needed.  Klonopin has been stopped.  SUMMARY OF TODAY'S PLAN:   Extubated, keep in the ICU for monitoring, I am concerned about airway protection  Best Practice / Goals of Care / Disposition.   DVT PROPHYLAXIS: Xarelto. NUTRITION: resume diet MOBILITY: bedrest. GOALS OF CARE:  Early extubation   LABS   Relevant labs and investigations personally reviewed.  9/24 urine cultures negative 1/2blood cultures positive for viridans Streptococcus   02/24/2018 no chest x-ray.  Respiratory cultures are negative so far   cct 30 min    Brett CanalesSteve Minor ACNP [facility] P[facility] Pager [phone] till 1 pm If no answer page [phone] 02/24/2018, 11:53 AM

## 2018-02-24 NOTE — Progress Notes (Signed)
PULMONARY / CRITICAL CARE MEDICINE   NAME:  Curtis Preston, MRN:  [id number], DOB:  05/04/1951, LOS: 58 ADMISSION DATE:  02/06/2018, CONSULTATION DATE:  02/09/2018 REFERRING MD:  Algis Liming, CHIEF COMPLAINT:  PNA in an immunocompromised patient  BRIEF HISTORY:    67 year old male with PMH as below, which is significant for Myasthenia gravis on imuran, COPD, PE on Xarelto, and CAD. He was in his usual state of health (gets winded daily) until about early August of this year, when he developed worsening shortness of breath with associated productive cough (green sputum). This persisted for about 6 weeks, until about 9/6, when his symptoms significant worsened after traveling here to [place] from [place] (where he lives). SOB worsened as did his productive cough, which caused him to present to ED. Workup in ED suggested pneumonia and he was started on empiric antibiotics. CT demonstrated bilateral ground glass, and most concerning for multi-focal pneumonia. Infectious disease consultation sees benefit in bronchoscopy for culture. PCCM consulted and recommended transfer to the intensive care unit for monitoring of respiratory status.   SIGNIFICANT EVENTS:   Extubated 9/22  SUBJECTIVE:    No events overnight, no new complaints, less lethargic this AM  CONSTITUTIONAL: BP 132/70   Pulse 64   Temp (!) 97.4 F (36.3 C) (Axillary)   Resp (!) 23   Ht _0  (1.854 m)   Wt 77.2 kg   SpO2 100%   BMI 22.45 kg/m   I/O last 3 completed shifts: In: 2856.9 [P.O.:2400; I.V.:331.8; IV Piggyback:125.1] Out: 1920 [Urine:1920]       PHYSICAL EXAM: General:  More alert and interactive this AM Neuro:  Lethargic, moving all ext to command, less lethargic this AM HEENT:   [place]/AT, PERRL, EOM-I and MMM Cardiovascular:  RRR, Nl S1/S2 and -M/R/G Lungs: Crackles Abdomen: Soft, NT, ND and +BS Musculoskeletal:  -edema and -tenderness Skin:  Line sites intact, no rashes.      ASSESSMENT AND PLAN    Hypoxemic respiratory failure. MV on PRVC ~ 7lpm. Weaning complicated by sedation requirements.  Improving lung mechanics.  D/C clonazepam D/C seroquel Monitor in the ICU for respiratory failure and airway protection Titrate O2 for sat of 88-92%  Subacute ILD. Work up in progress. As myasthenic crisis ruled out, so chronic aspiration less likely. CVTS has declined lung biopsy.  Repeat CT chest and BAL as part of autoimmune work up.  ALL cultures negative. Smear negative x2 - off isolation. Given negative microbiological studies, have started trial  of steroids, maintain on steroids for now  Myasthenia Gravis. Neurol note appreciated; not in myasthenic crisis and does not need plasma exchange; Imuran off.  Hx recurrent VTE and PE. Xarelto.  Anxiety and high sedation requirements.  Agitated delirium.  Lethargic this AM  SUMMARY OF TODAY'S PLAN:   Family updated bedside, maintain in the ICU for closer ICU monitoring of respiratory status.  D/C all sedation.  PO intake, PT/OT  Best Practice / Goals of Care / Disposition.   DVT PROPHYLAXIS: Xarelto. NUTRITION: resume feed via post-pyloric tube. MOBILITY: bedrest. GOALS OF CARE:  Early extubation  LABS   Relevant labs and investigations personally reviewed.  Leukocytosis has increased likely due to steroids.  Mild hyponatremia - likely due to mild volume overload has corrected   Abdominal XR today: feeding tube in duodenum. .  CXR shows progressive patchy interstitial and alveolar disease - improving.   All respiratory cultures are negative so far with no organisms seen. AFB from deep tracheal  aspirate is negative x2   Cell count from bronchoscopy showed small number of neutrophil predominant WBC. Reported organisms, but negative gram stain.  The patient is critically ill with multiple organ systems failure and requires high complexity decision making for assessment and support, frequent evaluation and titration of therapies,  application of advanced monitoring technologies and extensive interpretation of multiple databases.   Critical Care Time devoted to patient care services described in this note is  34  Minutes. This time reflects time of care of this signee Dr [name]. This critical care time does not reflect procedure time, or teaching time or supervisory time of PA/NP/Med student/Med Resident etc but could involve care discussion time.  Rush Farmer, M.D. [facility] Pulmonary/Critical Care Medicine. Pager: [phone]. After hours pager: [phone].

## 2018-02-24 NOTE — Progress Notes (Signed)
Hypoglycemic Event  CBG: 67  Treatment: 15 GM carbohydrate snack  Symptoms: None  Follow-up CBG: WUJW:1191Time:0424 CBG Result:91  Possible Reasons for Event: Inadequate meal intake      Okey DupreJennifer  Braleigh Massoud

## 2018-02-24 NOTE — Progress Notes (Signed)
..  [facility] PROTOCOL FOR AM LAB REPLACEMENT ONLY  The patient does apply for the [facility] Electrolyte Replacment Protocol based on the criteria listed below:   1. Is GFR >/= 40 ml/min? Yes.    Patient's GFR today is >60 2. Is urine output >/= 0.5 ml/kg/hr for the last 6 hours? Yes.   Patient's UOP is 0.7 ml/kg/hr 3. Is BUN < 60 mg/dL?   Patient's BUN today is 27 4. Abnormal electrolyte(s): K 3.2  5. Ordered repletion with: protocol 6. If a panic level lab has been reported, has the CCM MD in charge been notified? No.   Physician:  Dr. Althia FortsSommer  Curtis Preston, [name] 02/24/2018 5:21 AM

## 2018-02-25 ENCOUNTER — Inpatient Hospital Stay (HOSPITAL_COMMUNITY): Payer: Medicare HMO

## 2018-02-25 LAB — CBC WITH DIFFERENTIAL/PLATELET
Abs Immature Granulocytes: 0.5 10*3/uL — ABNORMAL HIGH (ref 0.0–0.1)
Basophils Absolute: 0.1 10*3/uL (ref 0.0–0.1)
Basophils Relative: 1 %
EOS ABS: 0.7 10*3/uL (ref 0.0–0.7)
EOS PCT: 6 %
HCT: 31.8 % — ABNORMAL LOW (ref 39.0–52.0)
Hemoglobin: 10.3 g/dL — ABNORMAL LOW (ref 13.0–17.0)
Immature Granulocytes: 4 %
LYMPHS ABS: 2.3 10*3/uL (ref 0.7–4.0)
Lymphocytes Relative: 18 %
MCH: 31.7 pg (ref 26.0–34.0)
MCHC: 32.4 g/dL (ref 30.0–36.0)
MCV: 97.8 fL (ref 78.0–100.0)
MONO ABS: 1 10*3/uL (ref 0.1–1.0)
Monocytes Relative: 8 %
Neutro Abs: 8.4 10*3/uL — ABNORMAL HIGH (ref 1.7–7.7)
Neutrophils Relative %: 65 %
Platelets: 562 10*3/uL — ABNORMAL HIGH (ref 150–400)
RBC: 3.25 MIL/uL — AB (ref 4.22–5.81)
RDW: 12.8 % (ref 11.5–15.5)
WBC: 12.9 10*3/uL — AB (ref 4.0–10.5)

## 2018-02-25 LAB — BASIC METABOLIC PANEL
Anion gap: 8 (ref 5–15)
BUN: 20 mg/dL (ref 8–23)
CHLORIDE: 104 mmol/L (ref 98–111)
CO2: 28 mmol/L (ref 22–32)
CREATININE: 0.72 mg/dL (ref 0.61–1.24)
Calcium: 7.7 mg/dL — ABNORMAL LOW (ref 8.9–10.3)
GFR calc Af Amer: >60 mL/min (ref >60)
Glucose, Bld: 88 mg/dL (ref 70–99)
Potassium: 3.6 mmol/L (ref 3.5–5.1)
Sodium: 140 mmol/L (ref 135–145)

## 2018-02-25 LAB — GLUCOSE, CAPILLARY
GLUCOSE-CAPILLARY: 83 mg/dL (ref 70–99)
GLUCOSE-CAPILLARY: 142 mg/dL — AB (ref 70–99)
Glucose-Capillary: 71 mg/dL (ref 70–99)
Glucose-Capillary: 72 mg/dL (ref 70–99)
Glucose-Capillary: 81 mg/dL (ref 70–99)
Glucose-Capillary: 97 mg/dL (ref 70–99)

## 2018-02-25 LAB — PHOSPHORUS: PHOSPHORUS: 3.3 mg/dL (ref 2.5–4.6)

## 2018-02-25 LAB — CULTURE, BLOOD (ROUTINE X 2): CULTURE: NO GROWTH

## 2018-02-25 LAB — MAGNESIUM: Magnesium: 1.9 mg/dL (ref 1.7–2.4)

## 2018-02-25 MED ORDER — INSULIN ASPART 100 UNIT/ML ~~LOC~~ SOLN: 0.0000 [IU] | SUBCUTANEOUS | Status: DC

## 2018-02-25 MED ORDER — POTASSIUM CHLORIDE CRYS ER 20 MEQ PO TBCR
40.0000 meq | EXTENDED_RELEASE_TABLET | Freq: Once | ORAL | Status: AC
  Administered 2018-02-25: 40 meq via ORAL
  Filled 2018-02-25: qty 2

## 2018-02-25 NOTE — Progress Notes (Signed)
  Speech Language Pathology Treatment: Dysphagia  Patient Details Name: Curtis Preston MRN: [id number] DOB: 1950-09-12 Today's Date: 02/25/2018 Time: 0698-6148 SLP Time Calculation (min) (ACUTE ONLY): 13 min  Assessment / Plan / Recommendation Clinical Impression  Pt consumed regular textures and thin liquids without overt signs of aspiration, although RR was elevated in the mid- to high-20s both at rest and during consumption. Education was reinforced about MBS results being functional, but risk remaining intermittently due to overall respiratory status. Aspiration precautions were reviewed. Recommend that he continue with regular diet and thin liquids. No further acute SLP needs identified.    HPI HPI: 67 year old male with PMH as below, which is significant for Myasthenia gravis on imuran, COPD, PE on Xarelto, and CAD. He presented to ED with worsening shortness of breath and workup revealed pneumonia. Pt required multiple intubations across 9 days with one self-extubation.      SLP Plan  All goals met       Recommendations  Diet recommendations: Regular;Thin liquid Liquids provided via: Cup;Straw Medication Administration: Whole meds with liquid Supervision: Patient able to self feed;Intermittent supervision to cue for compensatory strategies Compensations: Slow rate;Small sips/bites Postural Changes and/or Swallow Maneuvers: Seated upright 90 degrees                Oral Care Recommendations: Oral care BID Follow up Recommendations: None SLP Visit Diagnosis: Dysphagia, unspecified (R13.10) Plan: All goals met       GO                Germain Osgood 02/25/2018, 5:18 PM  Germain Osgood, M.A. [place] Acute Environmental education officer [phone] Office [phone]

## 2018-02-25 NOTE — Plan of Care (Signed)

## 2018-02-25 NOTE — Care Management Note (Addendum)
Case Management Note Patient Details  Name: Curtis Preston MRN: [id number] Date of Birth: 09/05/1950  Subjective/Objective:  67 year old male presented with worsening SOB and productive cough. PMH which is significant for Myasthenia gravis on imuran, COPD, PE on Xarelto, and CAD. Patient traveled  to [place] from [place] (where he lives) to visit his family. Patient is [facility] by [facility] [phone], unsure of his PCP's name; [facility]: Dr. Jani Files [phone]. Rx are obtained through [facility]. Patient indicated he lives at home with his fiancee, independent with ADLs PTA, and he occasionally utilizes a Programmer, multimedia.                  Action/Plan: Upon speaking to Dr. Nelda Marseille, patient wants to return back to [place], with assistance from CM requested. Patient is currently on HFO2 @ 8L, with transport via airplane not an option at this time d/t patient's current high O2 requirement. Non-emergent ambulance transport isn't covered by Medicare, with cost based on mileage, with payment often required upfront-oxygen cost required for transport would be separate from transport cost. CM met with patient to discuss transitional needs. Patient informed CM of his Veteran status, and suggested [place] could possibly assist with providing ambulance transportation. Patient also indicated he could ask his brother (who lives locally), if he's agreeable to providing car transport to [place], and will update CM. Patient denied having home O2 PTA; CM can assist with the arrangement of home/portable O2 but would require a <O2 liter flow, qualifying O2 sats to qualify for a portable concentrator. CM will contact patient's [place] providers, as requested, to inquire if transport arrangement is an option and will update patient/attending once determined.  CM will continue to follow.   Expected Discharge Date:  02/08/18               Expected Discharge  Plan:  (TBD)  In-House Referral:  NA  Discharge planning Services  CM Consult, DC out of service area  Post Acute Care Choice:  NA Choice offered to:  NA  DME Arranged:  N/A DME Agency:  NA  HH Arranged:  NA HH Agency:  NA  Status of Service:  In process, will continue to follow  If discussed at Long Length of Stay Meetings, dates discussed:    Additional Comments 02/25/2018  Pt is now extubated on 4-5 liters.  Pt able to discuss discharge planning with CM however he was SOB and demonstrated increased WOB with mere conversation.  Pt requested CM to research possible oxygen DME companies that could facilitate him driving home (approximately 8 hours).Pt has both [employer] and VA benefits.    Pt currently requiring more liter flow than a portable oxygen concentrator can provide.  CM contacted [facility] and informed of pending referral - company to review information and follow back up with CM.  CM left VM for PCP and Case Manager in [place] requesting call back.  Pt remains uniterested in transferring to a local [facility] facility.  Pts finance is already here from [place] and plans to transport pt back via private vehicle.  02/2018 Pt remains on ETT tube - CM will continue to follow   02/17/18 CM received call from [facility] [place] provided verbal update of pts medical status- VA not requesting hardcopy documentation at this time other than refusal of transfer form.  CM discussed/explained form with fiance Curtis Preston via phone - fiance refused transfer to local  VA and informed CM that she wants pt to remain at [facility] (pt is currently intubated and sedated).  CM AC provided second verification to discussion and refusal for transfer, CM also signed the form as second verification.  Refusal to Transfer Form faxed to [facility] as requested.   Finance informed CM that if pt is able to ride in private vehicle at discharge she will come and pick pt up  Pt remains intubated- per attending  may require a trach at some point.  CM contacted VA transfer and was informed pt will not be able receive transportation from the [place] to [employer] unless; he is already in a [place] facility and has left than 6 months to live.  CM questioned if the VA prefers a transfer to a local facility with ICU capability as pt remains critically ill at this point, VA will support the decision of the attending at [facility] - however pt can remain at [facility] if appropriate and will need family/friend to sign consent prior to transfer.  CM discussed case with attending - at this time pt is to critical for transfer, attending will readdress 9/18.  CM reached out to pts wife to discuss - pt informed CM that she wants the pt to remain here at [facility] and not transfer to [facility] informed.    02/17/18 Pt now on 62M and intubated.  CM contacted [facility] and was told to reach out to the [place] transport department at [phone] ext [phone].  02/13/18 @ 1538-Natalie Gay RNCM-Call received from April, [facility] [place] transfer coordinator. [employer], SW for [facility] will be the point of contact to assist with VA benefit needs. [phone] ext. [phone]/pager: [phone].   02/12/18 @ 1454-Natalie Gay RNCM-CM spoke to the transfer coordinator for [facility] to inquire if transportation could be arranged under patient's VA benefits. CM was informed to contact the transport coordinator at the nearest [place] facility to review patient's transfer benefits, then initiate the transportation if coverage was determined. Patient [place] PCP was verified as: Dr. Delynn Flavin @ [phone]; Case Manager: Cora Daniels @ [phone]: [phone]. CM spoke to April, transfer coordinator with [facility]; patient's information must be entered/loaded into [facility] system prior to verifying benefits. April will f/u with CM on 02/13/18 once his benefits are determined. CM updated patient and will continue to follow.   Midge Minium RN,  BSN, NCM-BC, ACM-RN [phone] 02/25/2018, 3:31 PM

## 2018-02-25 NOTE — Progress Notes (Signed)
PULMONARY / CRITICAL CARE MEDICINE   NAME:  Curtis Preston, MRN:  [id number], DOB:  [birth year], LOS: 62 ADMISSION DATE:  02/06/2018, CONSULTATION DATE:  02/09/2018 REFERRING MD:  Algis Liming,   CHIEF COMPLAINT:  PNA in an immunocompromised patient  BRIEF HISTORY:    67 year old male with PMH as below, which is significant for Myasthenia gravis on imuran, COPD, PE on Xarelto, and CAD. He was in his usual state of health (gets winded daily) until about early August of this year, when he developed worsening shortness of breath with associated productive cough (green sputum). This persisted for about 6 weeks, until about 9/6, when his symptoms significant worsened after traveling here to [place] from [place] (where he lives). SOB worsened as did his productive cough, which caused him to present to ED. Workup in ED suggested pneumonia and he was started on empiric antibiotics. CT demonstrated bilateral ground glass, and most concerning for multi-focal pneumonia. Infectious disease consultation sees benefit in bronchoscopy for culture. PCCM consulted and recommended transfer to the intensive care unit for monitoring of respiratory status.  Transfer to ICU, underwent bronchoscopy which is negative.  Treated for community-acquired pneumonia He also underwent work-up for ILD with negative serologies for connective tissue disease  SIGNIFICANT EVENTS:  9/13 -Intubated for hypoxia.   9/13 Bronch BAL 9/19 Repeat bronch, BAL 9/22- Extubated 9/22, remains in ICU as he was delirious  SUBJECTIVE:  No acute events overnight.  Awake today.  Wants to leave home tomorrow.  CONSTITUTIONAL: BP 125/69   Pulse 72   Temp (!) 97.2 F (36.2 C) (Axillary)   Resp (!) 26   Ht _0  (1.854 m)   Wt 72.7 kg   SpO2 96%   BMI 21.15 kg/m   I/O last 3 completed shifts: In: 2640 [P.O.:2200; I.V.:340; IV Piggyback:100] Out: 2140 [Urine:2140]     PHYSICAL EXAM: Gen:      No acute distress HEENT:  EOMI, sclera  anicteric Neck:     No masses; no thyromegaly Lungs:    Clear to auscultation bilaterally; normal respiratory effort CV:         Regular rate and rhythm; no murmurs Abd:      + bowel sounds; soft, non-tender; no palpable masses, no distension Ext:    No edema; adequate peripheral perfusion Skin:      Warm and dry; no rash Neuro: alert and oriented x 3 Psych: normal mood and affect      ASSESSMENT AND PLAN   Hypoxemic respiratory failure.  Bilateral groundglass opacities secondary to community-acquired pneumonia in immunosuppressed patient Status post bronchoscopy with negative cultures Wean down oxygen as tolerated Completed antibiotics per ID  Subacute ILD. Work up in progress. As myasthenic crisis ruled out, so chronic aspiration less likely. CVTS has declined lung biopsy.  Repeat CT chest and BAL as part of autoimmune work up.  ALL cultures including PJP negative. Smear negative x2 - off isolation. Given negative microbiological studies, have started trial  of steroids Continue current dose of solumedrol for now  Myasthenia Gravis. Neurology note appreciated; not in myasthenic crisis and does not need plasma exchange;  Holding outpatient imuran  Hx recurrent VTE and PE. Continue xarelto  Encephalopathy, ICU delirium Improving.  Holding outpatient sedating medications.  SUMMARY OF TODAY'S PLAN:   Transfer to stepdown Consult PT to start working with him Patient wants to leave tomorrow but cautioned him that he is too weak to leave in the current state.  Best Practice / Goals of  Care / Disposition.   DVT PROPHYLAXIS: Xarelto. NUTRITION: PO diet MOBILITY: PT GOALS OF CARE:  Full code  LABS   Relevant labs and investigations personally reviewed. Chem-7, CBC is stable.  Except for mild leukocytosis  Chest x-ray personally reviewed 9/25 shows stable bilateral lung opacities, mild left pleural effusion.  All respiratory cultures are negative so far with no organisms  seen. AFB from deep tracheal aspirate is negative x2   Cell count from bronchoscopy showed small number of neutrophil predominant WBC. Reported organisms, but negative gram stain.  Marshell Garfinkel MD [place] [facility] 02/25/2018, 1:51 PM

## 2018-02-26 ENCOUNTER — Inpatient Hospital Stay (HOSPITAL_COMMUNITY): Payer: Medicare HMO

## 2018-02-26 LAB — BASIC METABOLIC PANEL
Anion gap: 5 (ref 5–15)
BUN: 13 mg/dL (ref 8–23)
CHLORIDE: 111 mmol/L (ref 98–111)
CO2: 24 mmol/L (ref 22–32)
CREATININE: 0.64 mg/dL (ref 0.61–1.24)
Calcium: 7.2 mg/dL — ABNORMAL LOW (ref 8.9–10.3)
GFR calc Af Amer: >60 mL/min (ref >60)
GFR calc non Af Amer: >60 mL/min (ref >60)
Glucose, Bld: 85 mg/dL (ref 70–99)
Potassium: 3.4 mmol/L — ABNORMAL LOW (ref 3.5–5.1)
SODIUM: 140 mmol/L (ref 135–145)

## 2018-02-26 LAB — PHOSPHORUS: Phosphorus: 2.9 mg/dL (ref 2.5–4.6)

## 2018-02-26 LAB — CBC
HCT: 34.1 % — ABNORMAL LOW (ref 39.0–52.0)
HEMOGLOBIN: 11.1 g/dL — AB (ref 13.0–17.0)
MCH: 31.9 pg (ref 26.0–34.0)
MCHC: 32.6 g/dL (ref 30.0–36.0)
MCV: 98 fL (ref 78.0–100.0)
Platelets: 578 10*3/uL — ABNORMAL HIGH (ref 150–400)
RBC: 3.48 MIL/uL — ABNORMAL LOW (ref 4.22–5.81)
RDW: 12.9 % (ref 11.5–15.5)
WBC: 16 10*3/uL — ABNORMAL HIGH (ref 4.0–10.5)

## 2018-02-26 LAB — MAGNESIUM: MAGNESIUM: 1.7 mg/dL (ref 1.7–2.4)

## 2018-02-26 MED ORDER — PREDNISONE 50 MG PO TABS
60.0000 mg | ORAL_TABLET | Freq: Every day | ORAL | Status: DC
  Administered 2018-02-27 – 2018-03-02 (×4): 60 mg via ORAL
  Filled 2018-02-26 (×4): qty 1

## 2018-02-26 NOTE — Evaluation (Signed)
Physical Therapy Re-Evaluation and Progress Patient Details Name: Curtis Preston MRN: [id number] DOB: 1950-07-10 Today's Date: 02/26/2018   History of Present Illness  67 yo admitted with SOB, multifocal PNA and sepsis. PMhx: myasthenia gravis, CoPD, PE, CAD Intubated 9/13 for hypoxia, Extubated 9/23. Remained in ICU until 9/26 for delrium   Clinical Impression  Pt has had a decline in function since admission due to 10 days of ventilation. Pt is currently limited in safe mobility by decreased safety awareness, oxygen desaturation (see General Comments) and decreased strength and balance. Pt is from [place] and would like to return as soon as possible. Given pt's prior level of function and fiances availability to assist at discharge PT recommends CIR level rehab either here or in [place] to return to PLOF. Therapy goals have been reassessed and PT will continue to follow acutely to work towards those goals.     Follow Up Recommendations CIR    Equipment Recommendations  Other (comment)(TBD at next venue)       Precautions / Restrictions Precautions Precautions: Fall Restrictions Weight Bearing Restrictions: No      Mobility  Bed Mobility Overal bed mobility: Needs Assistance Bed Mobility: Sit to Supine       Sit to supine: Min assist   General bed mobility comments: mina for returning LE to bed  Transfers Overall transfer level: Needs assistance Equipment used: Rolling walker (2 wheeled) Transfers: Sit to/from [employer] Sit to Stand: Mod assist Stand pivot transfers: Mod assist       General transfer comment: attempted 2x without assist, on 3 time requires mod A for power up and steadying, attempted to step, vc static standing, pt then asked to sit, able to stand pivot back to bed, with quick sit on bed surface  Ambulation/Gait              General Gait Details: unable to attempt      Balance Overall balance assessment: Needs  assistance Sitting-balance support: Feet supported;Single extremity supported Sitting balance-Leahy Scale: Fair Sitting balance - Comments: able to sit without bil UE support  Postural control: Posterior lean   Standing balance-Leahy Scale: Poor Standing balance comment: posterior lean requires UE support on RW                             Pertinent Vitals/Pain Pain Assessment: No/denies pain    Home Living Family/patient expects to be discharged to:: Private residence Living Arrangements: Spouse/significant other;Non-relatives/Friends Available Help at Discharge: Family Type of Home: House Home Access: Stairs to enter Entrance Stairs-Rails: None Entrance Stairs-Number of Steps: 2 Home Layout: Two level;Able to live on main level with bedroom/bathroom;Laundry or work area in [employer] Equipment: Environmental consultant - 2 wheels;Cane - single point Additional Comments: fiancee, brother, son, grandkids all live with pt    Prior Function Level of Independence: Independent         Comments: no O2 prior, retired, works in the yard, driving        Extremity/Trunk Assessment   Upper Extremity Assessment Upper Extremity Assessment: Generalized weakness    Lower Extremity Assessment Lower Extremity Assessment: Generalized weakness       Communication   Communication: No difficulties  Cognition Arousal/Alertness: Awake/alert Behavior During Therapy: Anxious;Impulsive Overall Cognitive Status: Impaired/Different from baseline                     Current Attention Level: Selective   Following Commands:  Follows multi-step commands inconsistently Safety/Judgement: Decreased awareness of safety;Decreased awareness of deficits Awareness: Emergent Problem Solving: Requires verbal cues;Requires tactile cues General Comments: pt very eager to get up from recliner, requires 3x redirect while therapist was rearranging lines for safety of transfer, pt convinced he will be  able to walk in the room       General Comments General comments (skin integrity, edema, etc.): Pt on 4L O2 via [place], with SaO2 96%O2, with standing pt with 4/4 DoE and SaO2 drop to 83%O2, with supine back in bed instructed pt in pursed lipped breathing, and increase supplemental O2 rebounded to 98%O2, once RR reduced reduced supplemental O2 to 4L        Assessment/Plan    PT Assessment Patient needs continued PT services  PT Problem List Decreased strength;Decreased activity tolerance;Decreased balance;Decreased mobility;Decreased cognition;Decreased safety awareness;Cardiopulmonary status limiting activity       PT Treatment Interventions DME instruction;Functional mobility training;Balance training;Patient/family education;Gait training;Therapeutic activities;Stair training;Therapeutic exercise    PT Goals (Current goals can be found in the Care Plan section)  Acute Rehab PT Goals Patient Stated Goal: go home  PT Goal Formulation: With patient Time For Goal Achievement: 03/12/18 Potential to Achieve Goals: Fair    Frequency Min 3X/week    AM-PAC PT "6 Clicks" Daily Activity  Outcome Measure Difficulty turning over in bed (including adjusting bedclothes, sheets and blankets)?: Unable Difficulty moving from lying on back to sitting on the side of the bed? : Unable Difficulty sitting down on and standing up from a chair with arms (e.g., wheelchair, bedside commode, etc,.)?: Unable Help needed moving to and from a bed to chair (including a wheelchair)?: Total Help needed walking in hospital room?: Total Help needed climbing 3-5 steps with a railing? : Total 6 Click Score: 6    End of Session Equipment Utilized During Treatment: Gait belt;Oxygen Activity Tolerance: Patient tolerated treatment well;Patient limited by fatigue;Treatment limited secondary to medical complications (Comment)(orthostatics, dizzy ) Patient left: in chair;with call bell/phone within reach;with chair alarm  set Nurse Communication: Mobility status;Other (comment)(O2 status ) PT Visit Diagnosis: Unsteadiness on feet (R26.81);History of falling (Z91.81);Muscle weakness (generalized) (M62.81);Other abnormalities of gait and mobility (R26.89);Difficulty in walking, not elsewhere classified (R26.2)    Time: 2956-2130 PT Time Calculation (min) (ACUTE ONLY): 31 min   Charges:   PT Evaluation $PT Re-evaluation: 1 Re-eval PT Treatments $Therapeutic Activity: 8-22 mins        Shahzain Kiester B. Beverely Risen PT, DPT Acute Rehabilitation Services Pager [phone] Office [phone]   Elon Alas Fleet 02/26/2018, 1:12 PM

## 2018-02-26 NOTE — Progress Notes (Addendum)
PROGRESS NOTE [facility] Team 1 SDU/ICU    Curtis Preston   XTK:[id number] DOB: 05-21-1951  DOA: 02/06/2018 PCP: No primary care provider on file.   Brief Narrative:  Curtis Preston 67 year old male with past medical history of myasthenia gravis, COPD, PE and CAD presented to the emergency department with worsening of shortness of breath and productive sputum.  ED evaluation chest x-ray suggested pneumonia patient was started on empiric antibiotic, subsequently CT demonstrated bilateral groundglass opacity concerning for multifocal pneumonia.  ID was consulted and requested bronchoscopy for cultures.  Patient decompensated and was intubated due to persistent hypoxia.  Bronchoscopy was performed which was negative x2.  Patient underwent work-up for ILD and with negative serologies for connective tissue disease.  Significant events: 9/13 - intubated 9/13 - bronch with BAL 9/19 - repeated bronch with BAL 9/22 - extubated, remained in the ICU due to delirium  Subjective: Patient seen and examined, he is awake he wants to go home ([place]).  Patient report his breathing is significantly improved however he remains of high flow nasal cannula.  He seems very weak.  Denies chest pain, palpitations and dizziness.  No acute events overnight.  Assessment & Plan: Acute respiratory failure with hypoxia, bilateral groundglass opacity secondary to CAP in setting of immunosuppression from IVIG. Status post bronchoscopy with negative cultures, patient was treated with ciprofloxacin, imipenem and Bactrim.  Subsequently BAL was performed due to concern of tularemia which was negative.  Patient of antibiotic therapy seems 9/19 and doing well.  Continue to wean oxygen as able.  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  CVTS declining lung biopsy.  BAL with autoimmune work-up negative.  All cultures negative including PJP.  Trial of steroid with some improvement.  Currently on  Solu-Medrol will transition to prednisone 60 mg daily and taper from there.  Leukocytosis   WBC slight up likely from steroids, no signs of infectious process at this time. 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  Prior to admission patient was fully functional.  PT consulted and recommending CIR.  However patient from [place].  Recommend to continue PT daily for a few days and get him to a point that can be discharged home and follow-up with physical therapy in [place]  Acute encephalopathy - ICU delirium Resolved  Hypothyroidism Continue Synthroid  DVT prophylaxis: Xarelto Code Status: Full code Family Communication: None at bedside Disposition Plan: Transfer to [place].  Hopefully home in the next 2 to 3 days if strong enough to travel to [place]  Consultants:   PCCM  ID  Neurology   Antimicrobials: Anti-infectives (From admission, onward)   Start     Dose/Rate Route Frequency Ordered Stop   02/14/18 1400  imipenem-cilastatin (PRIMAXIN) 750 mg in sodium chloride 0.9 % 100 mL IVPB     750 mg 200 mL/hr over 30 Minutes Intravenous Every 8 hours 02/14/18 1031 02/17/18 2322   02/13/18 1330  sulfamethoxazole-trimethoprim (BACTRIM) 370 mg in dextrose 5 % 500 mL IVPB     370 mg 348.8 mL/hr over 90 Minutes Intravenous Every 8 hours 02/13/18 1238 02/17/18 2332   02/09/18 1600  ciprofloxacin (CIPRO) IVPB 400 mg  Status:  Discontinued  400 mg 200 mL/hr over 60 Minutes Intravenous Every 12 hours 02/09/18 1516 02/13/18 1140   02/09/18 1200  ciprofloxacin (CIPRO) tablet 500 mg  Status:  Discontinued     500 mg Oral 2 times daily 02/09/18 1159 02/09/18 1211   02/08/18 0100   vancomycin (VANCOCIN) IVPB 1000 mg/200 mL premix  Status:  Discontinued     1,000 mg 200 mL/hr over 60 Minutes Intravenous Every 24 hours 02/07/18 0107 02/09/18 1159   02/08/18 0000  azithromycin (ZITHROMAX) 250 mg in dextrose 5 % 125 mL IVPB  Status:  Discontinued     250 mg 125 mL/hr over 60 Minutes Intravenous Every 24 hours 02/07/18 0032 02/09/18 1159   02/07/18 0600  ceFEPIme (MAXIPIME) 1 g in sodium chloride 0.9 % 100 mL IVPB  Status:  Discontinued     1 g 200 mL/hr over 30 Minutes Intravenous Every 8 hours 02/07/18 0032 02/09/18 1159   02/07/18 0045  ceFEPIme (MAXIPIME) 1 g in sodium chloride 0.9 % 100 mL IVPB  Status:  Discontinued     1 g 200 mL/hr over 30 Minutes Intravenous Every 8 hours 02/07/18 0031 02/07/18 0032   02/07/18 0045  vancomycin (VANCOCIN) 1,500 mg in sodium chloride 0.9 % 500 mL IVPB     1,500 mg 250 mL/hr over 120 Minutes Intravenous  Once 02/07/18 0042 02/07/18 0559   02/06/18 2330  cefTRIAXone (ROCEPHIN) 1 g in sodium chloride 0.9 % 100 mL IVPB     1 g 200 mL/hr over 30 Minutes Intravenous  Once 02/06/18 2326 02/07/18 0048   02/06/18 2330  azithromycin (ZITHROMAX) 500 mg in sodium chloride 0.9 % 250 mL IVPB     500 mg 250 mL/hr over 60 Minutes Intravenous  Once 02/06/18 2326 02/07/18 0201          Objective: Vitals:   02/26/18 0500 02/26/18 0600 02/26/18 0748 02/26/18 0800  BP: 110/63 121/73  122/71  Pulse: 73 66  67  Resp: (!) 23 (!) 21  (!) 28  Temp:   (!) 97.5 F (36.4 C)   TempSrc:   Axillary   SpO2: 97% 97%  100%  Weight: 74.5 kg     Height:        Intake/Output Summary (Last 24 hours) at 02/26/2018 1021 Last data filed at 02/26/2018 0600 Gross per 24 hour  Intake 110 ml  Output 2800 ml  Net -2690 ml   Filed Weights   02/24/18 0500 02/25/18 0500 02/26/18 0500  Weight: 77.2 kg 72.7 kg 74.5 kg    Examination:  General exam: Appears calm and comfortable  Respiratory system: Decreased breath sounds bilaterally, mild diffuse  rhonchi/wheezing Cardiovascular system: S1 & S2 heard, RRR. No JVD, murmurs, rubs or gallops Gastrointestinal system: Abdomen is nondistended, soft and nontender.  Central nervous system: Alert and oriented. Extremities: No pedal edema. Skin: No rashes Psychiatry: Mood & affect appropriate.    Data Reviewed: I have personally reviewed following labs and imaging studies  CBC: Recent Labs  Lab 02/20/18 0356 02/21/18 0529 02/22/18 0247 02/24/18 0420 02/25/18 0734 02/26/18 0528  WBC 17.3* 12.4* 16.6* 10.5 12.9* 16.0*  NEUTROABS 14.6* 10.9*  --   --  8.4*  --   HGB 10.5* 9.8* 10.4* 9.8* 10.3* 11.1*  HCT 33.1* 30.6* 32.9* 30.5* 31.8* 34.1*  MCV 100.3* 100.7* 99.7 98.4 97.8 98.0  PLT 403* 380 541* 560* 562* 440*   Basic Metabolic Panel: Recent Labs  Lab 02/21/18 0716 02/22/18 0247 02/24/18 0420 02/25/18 0734 02/26/18 1027  NA 140 144 143 140 140  K 4.6 4.0 3.2* 3.6 3.4*  CL 101 104 105 104 111  CO2 _0 GLUCOSE 158* 111* 97 88 85  BUN 32* 38* 27* 20 13  CREATININE 0.67 0.63 0.68 0.72 0.64  CALCIUM 7.8* 8.0* 8.1* 7.7* 7.2*  MG  --  1.9 1.9 1.9 1.7  PHOS  --   --  3.0 3.3 2.9   GFR: Estimated Creatinine Clearance: 91.8 mL/min (by C-G formula based on SCr of 0.64 mg/dL). Liver Function Tests: Recent Labs  Lab 02/21/18 0716  AST 44*  ALT 26  ALKPHOS 105  BILITOT 0.4  PROT 4.9*  ALBUMIN 1.5*   No results for input(s): LIPASE, AMYLASE in the last 168 hours. No results for input(s): AMMONIA in the last 168 hours. Coagulation Profile: No results for input(s): INR, PROTIME in the last 168 hours. Cardiac Enzymes: No results for input(s): CKTOTAL, CKMB, CKMBINDEX, TROPONINI in the last 168 hours. BNP (last 3 results) No results for input(s): PROBNP in the last 8760 hours. HbA1C: No results for input(s): HGBA1C in the last 72 hours. CBG: Recent Labs  Lab 02/25/18 0445 02/25/18 0803 02/25/18 1103 02/25/18 1511 02/25/18 1926  GLUCAP 81 71 83 142* 97     Lipid Profile: No results for input(s): CHOL, HDL, LDLCALC, TRIG, CHOLHDL, LDLDIRECT in the last 72 hours. Thyroid Function Tests: No results for input(s): TSH, T4TOTAL, FREET4, T3FREE, THYROIDAB in the last 72 hours. Anemia Panel: No results for input(s): VITAMINB12, FOLATE, FERRITIN, TIBC, IRON, RETICCTPCT in the last 72 hours. Sepsis Labs: Recent Labs  Lab 02/20/18 0356 02/20/18 0619  LATICACIDVEN 1.1 0.6    Recent Results (from the past 240 hour(s))  Acid Fast Smear (AFB)     Status: None   Collection Time: 02/17/18 12:13 PM  Result Value Ref Range Status   AFB Specimen Processing Concentration  Final   Acid Fast Smear Negative  Final    Comment: (NOTE) Performed At: [facility] [place], [place] [id number] Rush Farmer MD TF:[id number]    Source (AFB) TRACHEAL ASPIRATE  Final    Comment: Performed at [facility], [place] [address] [place], [place] [address]  Gram stain     Status: None   Collection Time: 02/19/18 10:31 AM  Result Value Ref Range Status   Specimen Description BRONCHIAL ALVEOLAR LAVAGE  Final   Special Requests NONE  Final   Gram Stain   Final    ABUNDANT WBC PRESENT, PREDOMINANTLY PMN NO ORGANISMS SEEN Gram Stain Report Called to,Read Back By and Verified With: J POOLE,RN AT 1148 02/20/18 BY L BENFIELD Performed at [facility], [place] [address] [place], [place] [address]    Report Status 02/20/2018 FINAL  Final  Culture, blood (Routine X 2) w Reflex to ID Panel     Status: None   Collection Time: 02/20/18  3:50 AM  Result Value Ref Range Status   Specimen Description BLOOD LEFT ANTECUBITAL  Final   Special Requests   Final    BOTTLES DRAWN AEROBIC ONLY Blood Culture results may not be optimal due to an inadequate volume of blood received in culture bottles   Culture   Final    NO GROWTH 5 DAYS Performed at [facility], [place] [address], [place], [place] [address]    Report Status 02/25/2018 FINAL  Final   Culture, blood (Routine X 2) w Reflex to ID Panel  Status: Abnormal   Collection Time: 02/20/18  3:57 AM  Result Value Ref Range Status   Specimen Description BLOOD RIGHT ANTECUBITAL  Final   Special Requests   Final    BOTTLES DRAWN AEROBIC ONLY Blood Culture adequate volume   Culture  Setup Time   Final    GRAM POSITIVE COCCI AEROBIC BOTTLE ONLY CRITICAL RESULT CALLED TO, READ BACK BY AND VERIFIED WITH: L SEAY PHARMD 0401 02/21/18 A BROWNING    Culture (A)  Final    VIRIDANS STREPTOCOCCUS THE SIGNIFICANCE OF ISOLATING THIS ORGANISM FROM A SINGLE SET OF BLOOD CULTURES WHEN MULTIPLE SETS ARE DRAWN IS UNCERTAIN. PLEASE NOTIFY THE MICROBIOLOGY DEPARTMENT WITHIN ONE WEEK IF SPECIATION AND SENSITIVITIES ARE REQUIRED. Performed at [facility], [place] [address] [place], [place] [address]    Report Status 02/24/2018 FINAL  Final  Blood Culture ID Panel (Reflexed)     Status: Abnormal   Collection Time: 02/20/18  3:57 AM  Result Value Ref Range Status   Enterococcus species NOT DETECTED NOT DETECTED Final   Listeria monocytogenes NOT DETECTED NOT DETECTED Final   Staphylococcus species NOT DETECTED NOT DETECTED Final   Staphylococcus aureus NOT DETECTED NOT DETECTED Final   Streptococcus species DETECTED (A) NOT DETECTED Final    Comment: Not Enterococcus species, Streptococcus agalactiae, Streptococcus pyogenes, or Streptococcus pneumoniae. CRITICAL RESULT CALLED TO, READ BACK BY AND VERIFIED WITH: L SEAY PHARMD 0401 02/21/18 A BROWNING    Streptococcus agalactiae NOT DETECTED NOT DETECTED Final   Streptococcus pneumoniae NOT DETECTED NOT DETECTED Final   Streptococcus pyogenes NOT DETECTED NOT DETECTED Final   Acinetobacter baumannii NOT DETECTED NOT DETECTED Final   Enterobacteriaceae species NOT DETECTED NOT DETECTED Final   Enterobacter cloacae complex NOT DETECTED NOT DETECTED Final   Escherichia coli NOT DETECTED NOT DETECTED Final   Klebsiella oxytoca NOT DETECTED NOT  DETECTED Final   Klebsiella pneumoniae NOT DETECTED NOT DETECTED Final   Proteus species NOT DETECTED NOT DETECTED Final   Serratia marcescens NOT DETECTED NOT DETECTED Final   Haemophilus influenzae NOT DETECTED NOT DETECTED Final   Neisseria meningitidis NOT DETECTED NOT DETECTED Final   Pseudomonas aeruginosa NOT DETECTED NOT DETECTED Final   Candida albicans NOT DETECTED NOT DETECTED Final   Candida glabrata NOT DETECTED NOT DETECTED Final   Candida krusei NOT DETECTED NOT DETECTED Final   Candida parapsilosis NOT DETECTED NOT DETECTED Final   Candida tropicalis NOT DETECTED NOT DETECTED Final    Comment: Performed at [facility], [place]. [address] [place], [place] [address]  Culture, Urine     Status: None   Collection Time: 02/20/18  5:08 AM  Result Value Ref Range Status   Specimen Description URINE, RANDOM  Final   Special Requests NONE  Final   Culture   Final    NO GROWTH Performed at [facility], [place] [address], [place], [place] [address]    Report Status 02/21/2018 FINAL  Final      Radiology Studies: Dg Chest Port 1 View  Result Date: 02/26/2018 CLINICAL DATA:  Acute respiratory failure EXAM: PORTABLE CHEST 1 VIEW COMPARISON:  10/25/2017 FINDINGS: Left subclavian Port-A-Cath is stable. The heart is borderline enlarged. Patchy bilateral airspace disease right greater than left is stable. No pneumothorax. No pleural effusion. IMPRESSION: Stable bilateral airspace disease right greater than left. Electronically Signed   By: Marybelle Killings M.D.   On: 02/26/2018 09:26   Dg Chest Port 1 View  Result Date: 02/25/2018  CLINICAL DATA:  Respiratory failure, shortness of breath. EXAM: PORTABLE CHEST 1 VIEW COMPARISON:  Radiograph of February 22, 2018. FINDINGS: Stable cardiomediastinal silhouette. Endotracheal and nasogastric tubes have been removed. Left subclavian Port-A-Cath is unchanged in position. No pneumothorax is noted. Stable bilateral lung opacities are noted  concerning for multifocal pneumonia, most prominently seen in left lung base. Mild left pleural effusion may be present. Bony thorax is unremarkable. IMPRESSION: Endotracheal and nasogastric tubes have been removed. Stable bilateral lung opacities are noted consistent with multifocal pneumonia. Mild left pleural effusion is noted. Electronically Signed   By: Marijo Conception, M.D.   On: 02/25/2018 10:29      Scheduled Meds: . aspirin  81 mg Oral Daily  . atorvastatin  40 mg Oral QHS  . baclofen  10 mg Oral TID  . calcium-vitamin D  1 tablet Oral Daily  . Chlorhexidine Gluconate Cloth  6 each Topical Daily  . feeding supplement (ENSURE ENLIVE)  237 mL Oral TID BM  . Influenza vac split quadrivalent PF  0.5 mL Intramuscular Tomorrow-1000  . levothyroxine  175 mcg Oral QAC breakfast  . mouth rinse  15 mL Mouth Rinse BID  . methylPREDNISolone (SOLU-MEDROL) injection  80 mg Intravenous Daily  . mirtazapine  15 mg Oral QHS  . montelukast  10 mg Oral QHS  . pregabalin  50 mg Oral QHS  . pyridostigmine  30 mg Oral QID  . rivaroxaban  20 mg Oral Q supper  . senna-docusate  1 tablet Oral BID  . sodium chloride flush  10-40 mL Intracatheter Q12H  . vitamin B-12  1,000 mcg Oral Daily   Continuous Infusions: . sodium chloride 10 mL/hr at 02/23/18 0600     LOS: 18 days    Time spent: Total of 25 minutes spent with pt, greater than 50% of which was spent in discussion of  treatment, counseling and coordination of care   Chipper Oman, MD Pager: Text Page via [email or web address]   If 7PM-7AM, please contact night-coverage [email or web address] 02/26/2018, 10:21 AM   Note - This record has been created using [employer]. Chart creation errors have been sought, but may not always have been located. Such creation errors do not reflect on the standard of medical care.

## 2018-02-26 NOTE — Plan of Care (Signed)

## 2018-02-26 NOTE — Progress Notes (Signed)
Rehab Admissions Coordinator Note:  Patient was screened by Clois Dupes for appropriateness for an Inpatient Acute Rehab Consult per PT recommendation. OT eval pending. Patient not yet at a level to pursue the intensity of an inpt rehab admission. I will follow his progress. Noted patient and fiance are pursuing transport to [place] with high flow oxygen needs and via private vehicle. I would not order an inpt rehab consult unless pt and fiance would like to purse admission here at [facility] for rehab prior to return home to [place]. [employer] medicare would have to approve any rehab venue. Please advise.  Ottie Glazier, RN, MSN Rehab Admissions Coordinator [phone] 02/26/2018 1:39 PM

## 2018-02-26 NOTE — Care Management Note (Addendum)
Case Management Note Patient Details  Name: Curtis Preston MRN: [id number] Date of Birth: 14-Mar-1951  Subjective/Objective:  67 year old male presented with worsening SOB and productive cough. PMH which is significant for Myasthenia gravis on imuran, COPD, PE on Xarelto, and CAD. Patient traveled  to [place] from [place] (where he lives) to visit his family. Patient is followed by [facility] [phone], unsure of his PCP's name; [facility]: Dr. Jani Files [phone]. Rx are obtained through [facility]. Patient indicated he lives at home with his fiancee, independent with ADLs PTA, and he occasionally utilizes a Programmer, multimedia.                  Action/Plan: Upon speaking to Dr. Nelda Marseille, patient wants to return back to [place], with assistance from CM requested. Patient is currently on HFO2 @ 8L, with transport via airplane not an option at this time d/t patient's current high O2 requirement. Non-emergent ambulance transport isn't covered by Medicare, with cost based on mileage, with payment often required upfront-oxygen cost required for transport would be separate from transport cost. CM met with patient to discuss transitional needs. Patient informed CM of his Veteran status, and suggested [facility] could possibly assist with providing ambulance transportation. Patient also indicated he could ask his brother (who lives locally), if he's agreeable to providing car transport to [place], and will update CM. Patient denied having home O2 PTA; CM can assist with the arrangement of home/portable O2 but would require a <O2 liter flow, qualifying O2 sats to qualify for a portable concentrator. CM will contact patient's [place] providers, as requested, to inquire if transport arrangement is an option and will update patient/attending once determined.  CM will continue to follow.   Expected Discharge Date:  02/08/18               Expected Discharge  Plan:  (TBD)  In-House Referral:  NA  Discharge planning Services  CM Consult, DC out of service area  Post Acute Care Choice:  NA Choice offered to:  NA  DME Arranged:  N/A DME Agency:  NA  HH Arranged:  NA HH Agency:  NA  Status of Service:  In process, will continue to follow  If discussed at Long Length of Stay Meetings, dates discussed:    Additional Comments 02/26/2018  Pt evaluated by PT - CIR recommended/SNF following for back up plan.  Per CIR - pt not yet appropriate for CIR referral.  CM discussed process of CIR and SNF - pt is in agreement for both.  [facility] confirmed they can provide oxygen needed for transport home at current requirements .    CM discussed need of home oxygen with VA - VA recommended pt utilize other heath insurance as they can not provide needed amount of oxygen to transport for long distance.  Pt and finance are in agreement with [facility].  [facility] will review chart in Epic and follow back up with CM  02/25/18 Pt is now extubated on 4-5 liters.  Pt able to discuss discharge planning with CM however he was SOB and demonstrated increased WOB with mere conversation.  Pt requested CM to research possible oxygen DME companies that could facilitate him driving home (approximately 8 hours).Pt has both [employer] and VA benefits.    Pt currently requiring more liter flow than a portable oxygen concentrator can provide.  CM contacted [facility] and informed of pending referral - company to review information and  follow back up with CM.  CM left VM for PCP and Case Manager in [place] requesting call back.  Pt remains uniterested in transferring to a local [place] facility.  Pts finance is already here from [place] and plans to transport pt back via private vehicle.  02/2018 Pt remains on ETT tube - CM will continue to follow   02/17/18 CM received call from [place] provided verbal update of pts medical status- VA not requesting hardcopy  documentation at this time other than refusal of transfer form.  CM discussed/explained form with fiance Karle Plumber via phone - fiance refused transfer to local [place] and informed CM that she wants pt to remain at [facility] (pt is currently intubated and sedated).  CM AC provided second verification to discussion and refusal for transfer, CM also signed the form as second verification.  Refusal to Transfer Form faxed to [facility] as requested.   Finance informed CM that if pt is able to ride in private vehicle at discharge she will come and pick pt up  Pt remains intubated- per attending may require a trach at some point.  CM contacted VA transfer and was informed pt will not be able receive transportation from the [place] to [place] unless; he is already in a [place] facility and has left than 6 months to live.  CM questioned if the VA prefers a transfer to a local facility with ICU capability as pt remains critically ill at this point, VA will support the decision of the attending at [facility] - however pt can remain at [facility] if appropriate and will need family/friend to sign consent prior to transfer.  CM discussed case with attending - at this time pt is to critical for transfer, attending will readdress 9/18.  CM reached out to pts wife to discuss - pt informed CM that she wants the pt to remain here at [facility] and not transfer to [place] informed.    02/17/18 Pt now on 65M and intubated.  CM contacted [facility] and was told to reach out to the [place] transport department at [phone] ext [phone].  02/13/18 @ 1538-Natalie Gay RNCM-Call received from April, [facility] [place] transfer coordinator. [employer], SW for [facility] will be the point of contact to assist with VA benefit needs. [phone] ext. [phone]/pager: [phone].   02/12/18 @ 1454-Natalie Gay RNCM-CM spoke to the transfer coordinator for [place] to inquire if transportation could be arranged under patient's VA benefits. CM was informed to contact the  transport coordinator at the nearest [place] facility to review patient's transfer benefits, then initiate the transportation if coverage was determined. Patient [place] PCP was verified as: Dr. Delynn Flavin @ [phone]; Case Manager: Cora Daniels @ [phone]: [phone]. CM spoke to April, transfer coordinator with [facility]; patient's information must be entered/loaded into [facility] system prior to verifying benefits. April will f/u with CM on 02/13/18 once his benefits are determined. CM updated patient and will continue to follow.   Midge Minium RN, BSN, NCM-BC, ACM-RN [phone] 02/26/2018, 10:04 AM

## 2018-02-27 LAB — CBC WITH DIFFERENTIAL/PLATELET
Abs Immature Granulocytes: 0.7 10*3/uL — ABNORMAL HIGH (ref 0.0–0.1)
Basophils Absolute: 0.2 10*3/uL — ABNORMAL HIGH (ref 0.0–0.1)
Basophils Relative: 1 %
EOS ABS: 0.1 10*3/uL (ref 0.0–0.7)
Eosinophils Relative: 1 %
HEMATOCRIT: 34.1 % — AB (ref 39.0–52.0)
HEMOGLOBIN: 10.9 g/dL — AB (ref 13.0–17.0)
IMMATURE GRANULOCYTES: 5 %
LYMPHS ABS: 3 10*3/uL (ref 0.7–4.0)
Lymphocytes Relative: 19 %
MCH: 31.1 pg (ref 26.0–34.0)
MCHC: 32 g/dL (ref 30.0–36.0)
MCV: 97.2 fL (ref 78.0–100.0)
MONOS PCT: 8 %
Monocytes Absolute: 1.2 10*3/uL — ABNORMAL HIGH (ref 0.1–1.0)
NEUTROS PCT: 66 %
Neutro Abs: 10.7 10*3/uL — ABNORMAL HIGH (ref 1.7–7.7)
Platelets: 560 10*3/uL — ABNORMAL HIGH (ref 150–400)
RBC: 3.51 MIL/uL — ABNORMAL LOW (ref 4.22–5.81)
RDW: 13.2 % (ref 11.5–15.5)
WBC: 15.9 10*3/uL — ABNORMAL HIGH (ref 4.0–10.5)

## 2018-02-27 LAB — BASIC METABOLIC PANEL
Anion gap: 8 (ref 5–15)
BUN: 19 mg/dL (ref 8–23)
CHLORIDE: 103 mmol/L (ref 98–111)
CO2: 28 mmol/L (ref 22–32)
CREATININE: 0.73 mg/dL (ref 0.61–1.24)
Calcium: 8.1 mg/dL — ABNORMAL LOW (ref 8.9–10.3)
GFR calc non Af Amer: >60 mL/min (ref >60)
Glucose, Bld: 99 mg/dL (ref 70–99)
Potassium: 3.6 mmol/L (ref 3.5–5.1)
Sodium: 139 mmol/L (ref 135–145)

## 2018-02-27 LAB — MAGNESIUM: MAGNESIUM: 1.9 mg/dL (ref 1.7–2.4)

## 2018-02-27 MED ORDER — TIOTROPIUM BROMIDE MONOHYDRATE 18 MCG IN CAPS
18.0000 ug | ORAL_CAPSULE | Freq: Every day | RESPIRATORY_TRACT | Status: DC
  Administered 2018-02-28 – 2018-03-04 (×5): 18 ug via RESPIRATORY_TRACT
  Filled 2018-02-27: qty 5

## 2018-02-27 MED ORDER — PANTOPRAZOLE SODIUM 40 MG PO TBEC
40.0000 mg | DELAYED_RELEASE_TABLET | Freq: Every day | ORAL | Status: DC
  Administered 2018-02-27 – 2018-03-05 (×7): 40 mg via ORAL
  Filled 2018-02-27 (×8): qty 1

## 2018-02-27 MED ORDER — ALBUTEROL SULFATE (2.5 MG/3ML) 0.083% IN NEBU: 2.5000 mg | INHALATION_SOLUTION | RESPIRATORY_TRACT | Status: DC | PRN

## 2018-02-27 MED ORDER — PREDNISONE 50 MG PO TABS: 60.0000 mg | ORAL_TABLET | Freq: Every day | ORAL | Status: DC

## 2018-02-27 MED ORDER — ARFORMOTEROL TARTRATE 15 MCG/2ML IN NEBU
15.0000 ug | INHALATION_SOLUTION | Freq: Two times a day (BID) | RESPIRATORY_TRACT | Status: DC
  Administered 2018-02-27 – 2018-03-05 (×12): 15 ug via RESPIRATORY_TRACT
  Filled 2018-02-27 (×12): qty 2

## 2018-02-27 NOTE — Care Management Important Message (Signed)
Important Message  Patient Details  Name: Curtis Preston MRN: [id number] Date of Birth: [birth year]   Medicare Important Message Given:  Yes    Muzamil Harker Stefan Church 02/27/2018, 3:44 PM

## 2018-02-27 NOTE — Progress Notes (Signed)
Inpatient Rehabilitation Admissions Coordinator  If patient remains hospitalized over the weekend, and would like to pursue an inpt rehab admission here at [facility] to prepare for d/c to [place] when functionally improved, I would recommend an inpt rehab consult. Please advise.  Ottie Glazier, RN, MSN Rehab Admissions Coordinator [phone] 02/27/2018 3:41 PM

## 2018-02-27 NOTE — Progress Notes (Signed)
PROGRESS NOTE        PATIENT DETAILS Name: Curtis Preston Age: 67 y.o. Sex: male Date of Birth: 1950/10/06 Admit Date: 02/06/2018 Admitting Physician Ivor Costa, MD PCP:No primary care provider on file.  Brief Narrative: Patient is a 67 y.o. male with history of myasthenia gravis on Imuran, PE on Xarelto, COPD not on home O2 who originally is from [place] and is here in town for his brother-in-law's funeral, he developed worsening shortness of breath and cough (started since August but worsened acutely)-he subsequently presented to the ED and was found to have pneumonia and started on empiric antibiotics.  Ultimately patient worsened and required intubation on 9/13.  While in the ICU, patient underwent bronchoscopy with negative BAL.  Upon clinical stability he was extubated and transferred to the trial hospitalist service on 9/26.  Hospital course has been complicated by ICU delirium, severe deconditioning and ongoing hypoxia but gradually improving.  See below for further details  Significant events: 9/13 - intubated 9/13 - bronch with BAL 9/19 - repeated bronch with BAL 9/22 - extubated, remained in the ICU due to delirium   Subjective: Requesting discharge-but is on 5 L of oxygen this morning-and gets short of breath with minimal activity.  He appears very weak.  Denies any chest pain or abdominal pain.  Assessment/Plan: Acute hypoxic respiratory failure secondary to multifocal pneumonia: Has completed a course of antimicrobial therapy, all cultures including BAL cultures are negative.  Unfortunately still requiring significant amount of oxygen to maintain O2 saturations gets short of breath with minimal activity.  Continue supportive care.  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  Slowly taper off steroids.  Leukocytosis: Likely secondary to steroids-slowly improving-completed a course of antimicrobial  therapy.  Follow.  Myasthenia gravis: Seen by neurology-not felt to have myasthenic crisis-Imuran remains on hold.  Continue Mestinon.  COPD: Not Wheezing-moving air well-continue bronchodilators  Hypothyroidism: Continue Synthroid  History of pulmonary embolism: Continue rivaroxaban.  ICU delirium: Resolved  Severe deconditioning: Continue eval by rehab services-we will see how he does the weekend-definitely will require some sort of rehab services on discharge-  Note patient indicating that he wants to drive back to [place] today-I told him that this was not medically advisable-he is aware of the life-threatening and life disabling risks.  DVT Prophylaxis: Full dose anticoagulation with Xarelto  Code Status: Full code   Family Communication: None at bedside  Disposition Plan: Remain inpatient-not stable for discharge at this meantime-suspect requires further optimization over the weekend to determine appropriate disposition  Antimicrobial agents: Anti-infectives (From admission, onward)   Start     Dose/Rate Route Frequency Ordered Stop   02/14/18 1400  imipenem-cilastatin (PRIMAXIN) 750 mg in sodium chloride 0.9 % 100 mL IVPB     750 mg 200 mL/hr over 30 Minutes Intravenous Every 8 hours 02/14/18 1031 02/17/18 2322   02/13/18 1330  sulfamethoxazole-trimethoprim (BACTRIM) 370 mg in dextrose 5 % 500 mL IVPB     370 mg 348.8 mL/hr over 90 Minutes Intravenous Every 8 hours 02/13/18 1238 02/17/18 2332   02/09/18 1600  ciprofloxacin (CIPRO) IVPB 400 mg  Status:  Discontinued     400 mg 200 mL/hr over 60 Minutes Intravenous Every 12 hours 02/09/18 1516 02/13/18 1140   02/09/18 1200  ciprofloxacin (CIPRO) tablet 500 mg  Status:  Discontinued     500 mg Oral 2 times daily 02/09/18 1159 02/09/18 1211   02/08/18 0100  vancomycin (VANCOCIN) IVPB 1000 mg/200 mL premix  Status:  Discontinued     1,000 mg 200 mL/hr over 60 Minutes Intravenous Every 24 hours 02/07/18 0107 02/09/18 1159    02/08/18 0000  azithromycin (ZITHROMAX) 250 mg in dextrose 5 % 125 mL IVPB  Status:  Discontinued     250 mg 125 mL/hr over 60 Minutes Intravenous Every 24 hours 02/07/18 0032 02/09/18 1159   02/07/18 0600  ceFEPIme (MAXIPIME) 1 g in sodium chloride 0.9 % 100 mL IVPB  Status:  Discontinued     1 g 200 mL/hr over 30 Minutes Intravenous Every 8 hours 02/07/18 0032 02/09/18 1159   02/07/18 0045  ceFEPIme (MAXIPIME) 1 g in sodium chloride 0.9 % 100 mL IVPB  Status:  Discontinued     1 g 200 mL/hr over 30 Minutes Intravenous Every 8 hours 02/07/18 0031 02/07/18 0032   02/07/18 0045  vancomycin (VANCOCIN) 1,500 mg in sodium chloride 0.9 % 500 mL IVPB     1,500 mg 250 mL/hr over 120 Minutes Intravenous  Once 02/07/18 0042 02/07/18 0559   02/06/18 2330  cefTRIAXone (ROCEPHIN) 1 g in sodium chloride 0.9 % 100 mL IVPB     1 g 200 mL/hr over 30 Minutes Intravenous  Once 02/06/18 2326 02/07/18 0048   02/06/18 2330  azithromycin (ZITHROMAX) 500 mg in sodium chloride 0.9 % 250 mL IVPB     500 mg 250 mL/hr over 60 Minutes Intravenous  Once 02/06/18 2326 02/07/18 0201      Procedures: 9/13 -Intubated for hypoxia.  9/13 Bronch BAL 9/19 Repeat bronch, BAL  CONSULTS:  pulmonary/intensive care, neurology and CTVS  Time spent: 25- minutes-Greater than 50% of this time was spent in counseling, explanation of diagnosis, planning of further management, and coordination of care.  MEDICATIONS: Scheduled Meds: . aspirin  81 mg Oral Daily  . atorvastatin  40 mg Oral QHS  . baclofen  10 mg Oral TID  . calcium-vitamin D  1 tablet Oral Daily  . Chlorhexidine Gluconate Cloth  6 each Topical Daily  . feeding supplement (ENSURE ENLIVE)  237 mL Oral TID BM  . levothyroxine  175 mcg Oral QAC breakfast  . mouth rinse  15 mL Mouth Rinse BID  . mirtazapine  15 mg Oral QHS  . montelukast  10 mg Oral QHS  . predniSONE  60 mg Oral Q breakfast  . pregabalin  50 mg Oral QHS  . pyridostigmine  30 mg Oral QID  .  rivaroxaban  20 mg Oral Q supper  . senna-docusate  1 tablet Oral BID  . sodium chloride flush  10-40 mL Intracatheter Q12H  . vitamin B-12  1,000 mcg Oral Daily   Continuous Infusions: . sodium chloride 10 mL/hr at 02/23/18 0600   PRN Meds:.sodium chloride, acetaminophen (TYLENOL) oral liquid 160 mg/5 mL, polyethylene glycol, sodium chloride flush   PHYSICAL EXAM: Vital signs: Vitals:   02/26/18 1300 02/26/18 1706 02/26/18 2146 02/27/18 0526  BP:  109/68 106/60 117/63  Pulse: 74 97 73 75  Resp: (!) 33 (!) _0 Temp:   98.9 F (37.2 C) 99 F (37.2 C)  TempSrc:      SpO2: 100% 97% 96% 97%  Weight:    71.6 kg  Height:       Filed Weights   02/25/18 0500 02/26/18 0500 02/27/18 0526  Weight: 72.7 kg  74.5 kg 71.6 kg   Body mass index is 20.83 kg/m.   General appearance :Awake, alert, not in any distress.  Appears weak Eyes:Pink conjunctiva HEENT: Atraumatic and Normocephalic Neck: supple, no JVD. No cervical lymphadenopathy.  Resp:Good air entry bilaterally, no rhonchi CVS: S1 S2 regular, no murmurs.  GI: Bowel sounds present, Non tender and not distended with no gaurding, rigidity or rebound. Extremities: B/L Lower Ext shows no edema, both legs are warm to touch Neurology:  speech clear,Non focal, sensation is grossly intact. Psychiatric: Normal judgment and insight. Alert and oriented x 3. Normal mood. Musculoskeletal:No digital cyanosis Skin:No Rash, warm and dry Wounds:N/A  I have personally reviewed following labs and imaging studies  LABORATORY DATA: CBC: Recent Labs  Lab 02/21/18 0529 02/22/18 0247 02/24/18 0420 02/25/18 0734 02/26/18 0528 02/27/18 0331  WBC 12.4* 16.6* 10.5 12.9* 16.0* 15.9*  NEUTROABS 10.9*  --   --  8.4*  --  10.7*  HGB 9.8* 10.4* 9.8* 10.3* 11.1* 10.9*  HCT 30.6* 32.9* 30.5* 31.8* 34.1* 34.1*  MCV 100.7* 99.7 98.4 97.8 98.0 97.2  PLT 380 541* 560* 562* 578* 560*    Basic Metabolic Panel: Recent Labs  Lab 02/22/18 0247  02/24/18 0420 02/25/18 0734 02/26/18 0528 02/27/18 0331  NA 144 143 140 140 139  K 4.0 3.2* 3.6 3.4* 3.6  CL 104 105 104 111 103  CO2 _0 GLUCOSE 111* 97 88 85 99  BUN 38* 27* _1 CREATININE 0.63 0.68 0.72 0.64 0.73  CALCIUM 8.0* 8.1* 7.7* 7.2* 8.1*  MG 1.9 1.9 1.9 1.7 1.9  PHOS  --  3.0 3.3 2.9  --     GFR: Estimated Creatinine Clearance: 88.3 mL/min (by C-G formula based on SCr of 0.73 mg/dL).  Liver Function Tests: Recent Labs  Lab 02/21/18 0716  AST 44*  ALT 26  ALKPHOS 105  BILITOT 0.4  PROT 4.9*  ALBUMIN 1.5*   No results for input(s): LIPASE, AMYLASE in the last 168 hours. No results for input(s): AMMONIA in the last 168 hours.  Coagulation Profile: No results for input(s): INR, PROTIME in the last 168 hours.  Cardiac Enzymes: No results for input(s): CKTOTAL, CKMB, CKMBINDEX, TROPONINI in the last 168 hours.  BNP (last 3 results) No results for input(s): PROBNP in the last 8760 hours.  HbA1C: No results for input(s): HGBA1C in the last 72 hours.  CBG: Recent Labs  Lab 02/25/18 0445 02/25/18 0803 02/25/18 1103 02/25/18 1511 02/25/18 1926  GLUCAP 81 71 83 142* 97    Lipid Profile: No results for input(s): CHOL, HDL, LDLCALC, TRIG, CHOLHDL, LDLDIRECT in the last 72 hours.  Thyroid Function Tests: No results for input(s): TSH, T4TOTAL, FREET4, T3FREE, THYROIDAB in the last 72 hours.  Anemia Panel: No results for input(s): VITAMINB12, FOLATE, FERRITIN, TIBC, IRON, RETICCTPCT in the last 72 hours.  Urine analysis:    Component Value Date/Time   COLORURINE YELLOW 02/20/2018 0508   APPEARANCEUR CLEAR 02/20/2018 0508   LABSPEC 1.021 02/20/2018 0508   PHURINE 5.0 02/20/2018 0508   GLUCOSEU NEGATIVE 02/20/2018 0508   HGBUR MODERATE (A) 02/20/2018 0508   BILIRUBINUR NEGATIVE 02/20/2018 0508   KETONESUR NEGATIVE 02/20/2018 0508   PROTEINUR NEGATIVE 02/20/2018 0508   NITRITE NEGATIVE 02/20/2018 0508   LEUKOCYTESUR NEGATIVE  02/20/2018 0508    Sepsis Labs: Lactic Acid, Venous    Component Value Date/Time   LATICACIDVEN 0.6 02/20/2018 0619    MICROBIOLOGY: Recent Results (from the past  240 hour(s))  Acid Fast Smear (AFB)     Status: None   Collection Time: 02/17/18 12:13 PM  Result Value Ref Range Status   AFB Specimen Processing Concentration  Final   Acid Fast Smear Negative  Final    Comment: (NOTE) Performed At: [facility] [place], [place] [id number] Rush Farmer MD FA:[id number]    Source (AFB) TRACHEAL ASPIRATE  Final    Comment: Performed at [facility], [place] [address] [place], [place] [address]  Gram stain     Status: None   Collection Time: 02/19/18 10:31 AM  Result Value Ref Range Status   Specimen Description BRONCHIAL ALVEOLAR LAVAGE  Final   Special Requests NONE  Final   Gram Stain   Final    ABUNDANT WBC PRESENT, PREDOMINANTLY PMN NO ORGANISMS SEEN Gram Stain Report Called to,Read Back By and Verified With: J POOLE,RN AT 1148 02/20/18 BY L BENFIELD Performed at [facility], [place] [address] [place], [place] [address]    Report Status 02/20/2018 FINAL  Final  Culture, blood (Routine X 2) w Reflex to ID Panel     Status: None   Collection Time: 02/20/18  3:50 AM  Result Value Ref Range Status   Specimen Description BLOOD LEFT ANTECUBITAL  Final   Special Requests   Final    BOTTLES DRAWN AEROBIC ONLY Blood Culture results may not be optimal due to an inadequate volume of blood received in culture bottles   Culture   Final    NO GROWTH 5 DAYS Performed at [facility], [place] [address], [place], [place] [address]    Report Status 02/25/2018 FINAL  Final  Culture, blood (Routine X 2) w Reflex to ID Panel     Status: Abnormal   Collection Time: 02/20/18  3:57 AM  Result Value Ref Range Status   Specimen Description BLOOD RIGHT ANTECUBITAL  Final   Special Requests   Final    BOTTLES DRAWN AEROBIC ONLY Blood Culture adequate volume    Culture  Setup Time   Final    GRAM POSITIVE COCCI AEROBIC BOTTLE ONLY CRITICAL RESULT CALLED TO, READ BACK BY AND VERIFIED WITH: L SEAY PHARMD 0401 02/21/18 A BROWNING    Culture (A)  Final    VIRIDANS STREPTOCOCCUS THE SIGNIFICANCE OF ISOLATING THIS ORGANISM FROM A SINGLE SET OF BLOOD CULTURES WHEN MULTIPLE SETS ARE DRAWN IS UNCERTAIN. PLEASE NOTIFY THE MICROBIOLOGY DEPARTMENT WITHIN ONE WEEK IF SPECIATION AND SENSITIVITIES ARE REQUIRED. Performed at [facility], [place] [address], [place], [place] [address]    Report Status 02/24/2018 FINAL  Final  Blood Culture ID Panel (Reflexed)     Status: Abnormal   Collection Time: 02/20/18  3:57 AM  Result Value Ref Range Status   Enterococcus species NOT DETECTED NOT DETECTED Final   Listeria monocytogenes NOT DETECTED NOT DETECTED Final   Staphylococcus species NOT DETECTED NOT DETECTED Final   Staphylococcus aureus NOT DETECTED NOT DETECTED Final   Streptococcus species DETECTED (A) NOT DETECTED Final    Comment: Not Enterococcus species, Streptococcus agalactiae, Streptococcus pyogenes, or Streptococcus pneumoniae. CRITICAL RESULT CALLED TO, READ BACK BY AND VERIFIED WITH: L SEAY PHARMD 0401 02/21/18 A BROWNING    Streptococcus agalactiae NOT DETECTED NOT DETECTED Final   Streptococcus pneumoniae NOT DETECTED NOT DETECTED Final   Streptococcus pyogenes NOT DETECTED NOT DETECTED Final   Acinetobacter baumannii NOT DETECTED NOT DETECTED Final   Enterobacteriaceae species NOT DETECTED NOT DETECTED  Final   Enterobacter cloacae complex NOT DETECTED NOT DETECTED Final   Escherichia coli NOT DETECTED NOT DETECTED Final   Klebsiella oxytoca NOT DETECTED NOT DETECTED Final   Klebsiella pneumoniae NOT DETECTED NOT DETECTED Final   Proteus species NOT DETECTED NOT DETECTED Final   Serratia marcescens NOT DETECTED NOT DETECTED Final   Haemophilus influenzae NOT DETECTED NOT DETECTED Final   Neisseria meningitidis NOT DETECTED NOT DETECTED Final    Pseudomonas aeruginosa NOT DETECTED NOT DETECTED Final   Candida albicans NOT DETECTED NOT DETECTED Final   Candida glabrata NOT DETECTED NOT DETECTED Final   Candida krusei NOT DETECTED NOT DETECTED Final   Candida parapsilosis NOT DETECTED NOT DETECTED Final   Candida tropicalis NOT DETECTED NOT DETECTED Final    Comment: Performed at [facility], [place] [address], [place], [place] [address]  Culture, Urine     Status: None   Collection Time: 02/20/18  5:08 AM  Result Value Ref Range Status   Specimen Description URINE, RANDOM  Final   Special Requests NONE  Final   Culture   Final    NO GROWTH Performed at [facility], [place] [address] [place], [place] [address]    Report Status 02/21/2018 FINAL  Final    RADIOLOGY STUDIES/RESULTS: Dg Chest 2 View  Result Date: 02/08/2018 CLINICAL DATA:  Chest pain and shortness of breath EXAM: CHEST - 2 VIEW COMPARISON:  February 06, 2018 FINDINGS: The left central line is stable. There appears to be a small bulla in the left apex. No pneumothorax. Bilateral pulmonary opacities most prominent in the mid lower lungs. No other interval changes or acute abnormalities. IMPRESSION: 1. Stable left Port-A-Cath. 2. Bilateral pulmonary opacities may represent worsening edema versus worsening bilateral pneumonia. Recommend clinical correlation and follow-up to resolution. Electronically Signed   By: Dorise Bullion III M.D   On: 02/08/2018 07:08   Dg Chest 2 View  Result Date: 02/06/2018 CLINICAL DATA:  Shortness of breath cough and congestion for 3 days. EXAM: CHEST - 2 VIEW COMPARISON:  None. FINDINGS: Left subclavian injectable port terminates at the expected location of the cavoatrial junction. Cardiomediastinal silhouette is normal. Mediastinal contours appear intact. There is no evidence of pleural effusion or pneumothorax. Patchy interstitial and alveolar opacities in bilateral lungs, with particular prominence in the right upper lobe, right  middle lobe and left lower lobe. Possible pulmonary nodule in the right mid lung field. Osseous structures are without acute abnormality. Soft tissues are grossly normal. IMPRESSION: Patchy interstitial and alveolar opacities in bilateral lungs. Findings may represent multifocal pneumonia or developing asymmetric pulmonary edema. Possible pulmonary nodule in the right mid lung field. Electronically Signed   By: Fidela Salisbury M.D.   On: 02/06/2018 21:12   Dg Abd 1 View  Result Date: 02/15/2018 CLINICAL DATA:  Nausea and vomiting EXAM: ABDOMEN - 1 VIEW COMPARISON:  None. FINDINGS: Scattered large and small bowel gas is noted. Cecum is mildly prominent at almost 12 cm. No free air is seen. Nasogastric catheter is noted within the stomach. No bony abnormality is seen. No mass lesion is noted. IMPRESSION: Gas dilated cecum to 12 cm. Nasogastric catheter within the stomach. Electronically Signed   By: Inez Catalina M.D.   On: 02/15/2018 09:46   Ct Chest Wo Contrast  Result Date: 02/18/2018 CLINICAL DATA:  Increased secretions, ventilator dependent. EXAM: CT CHEST WITHOUT CONTRAST TECHNIQUE: Multidetector CT imaging of the chest was performed following the standard protocol without IV contrast. COMPARISON:  Radiograph of February 16, 2018. CT scan of February 08, 2018. FINDINGS: Cardiovascular: Atherosclerosis of thoracic aorta is noted without aneurysm formation. Coronary artery calcifications are noted. Normal cardiac size. No pericardial effusion. Mediastinum/Nodes: Feeding tube is seen passing through esophagus into stomach. Significantly increased mediastinal adenopathy is noted compared to prior exam, most likely inflammatory in etiology; this includes 14 mm precarinal lymph node. 11 mm right paratracheal lymph node is noted and and increased compared to prior exam. 12 mm left hilar lymph node is noted which is enlarged compared to prior exam. Tracheostomy tube is well positioned. Lungs/Pleura: No  pneumothorax is noted. Emphysematous disease is again noted. Significantly increased lung opacities are noted bilaterally and diffusely most consistent with worsening pneumonia. No significant pleural effusion is noted. Upper Abdomen: No acute abnormality. Musculoskeletal: No chest wall mass or suspicious bone lesions identified. IMPRESSION: Significantly increased airspace opacities are noted throughout both lungs consistent with severely worsening pneumonia. Increased mediastinal adenopathy is also noted most likely is inflammatory in etiology. Coronary artery calcifications are noted. Aortic Atherosclerosis (ICD10-I70.0) and Emphysema (ICD10-J43.9). Electronically Signed   By: Marijo Conception, M.D.   On: 02/18/2018 16:50   Ct Chest Wo Contrast  Result Date: 02/08/2018 CLINICAL DATA:  Progressive hypoxemia.  Pneumonia. EXAM: CT CHEST WITHOUT CONTRAST TECHNIQUE: Multidetector CT imaging of the chest was performed following the standard protocol without IV contrast. COMPARISON:  Chest x-ray 02/08/2018 FINDINGS: Cardiovascular: Heart size is normal. Atherosclerotic calcifications are present coronary arteries. Atherosclerotic changes are present at the aortic arch. Pulmonary arteries mildly prominent bilaterally. No significant pericardial effusion is present. Mediastinum/Nodes: No significant mediastinal, axillary, or hilar adenopathy is present. The esophagus is within normal limits. The thoracic inlet is unremarkable. Lungs/Pleura: Extensive confluent ground-glass attenuation is present throughout the lower lobes, the lingula, and right middle lobe. Consolidation is most evident in the superior segment of the left lower lobe. Extensive centrilobular emphysematous changes are present. No significant pleural effusion is present. Upper Abdomen: Unremarkable. Musculoskeletal: A remote superior endplate fractures present at T7. Vertebral body heights alignment are otherwise maintained. No focal lytic or blastic  lesions are present. Ribs are within normal limits. IMPRESSION: 1. Extensive confluent ground-glass attenuation throughout both lungs with consolidation more prevalent in the superior segment of the left lower lobe. The findings are nonspecific, but most concerning for multi lobar pneumonia. Edema or possibly could have a similar appearance. This is to extensive for atelectasis. Consolidation in the superior segment in the left lower lobe suggests infection. 2.  Aortic Atherosclerosis (ICD10-I70.0). 3.  Emphysema (ICD10-J43.9). 4. Coronary artery disease Electronically Signed   By: San Morelle M.D.   On: 02/08/2018 11:47   Dg Chest Port 1 View  Result Date: 02/26/2018 CLINICAL DATA:  Acute respiratory failure EXAM: PORTABLE CHEST 1 VIEW COMPARISON:  10/25/2017 FINDINGS: Left subclavian Port-A-Cath is stable. The heart is borderline enlarged. Patchy bilateral airspace disease right greater than left is stable. No pneumothorax. No pleural effusion. IMPRESSION: Stable bilateral airspace disease right greater than left. Electronically Signed   By: Marybelle Killings M.D.   On: 02/26/2018 09:26   Dg Chest Port 1 View  Result Date: 02/25/2018 CLINICAL DATA:  Respiratory failure, shortness of breath. EXAM: PORTABLE CHEST 1 VIEW COMPARISON:  Radiograph of February 22, 2018. FINDINGS: Stable cardiomediastinal silhouette. Endotracheal and nasogastric tubes have been removed. Left subclavian Port-A-Cath is unchanged in position. No pneumothorax is noted. Stable bilateral lung opacities are noted concerning for multifocal pneumonia, most prominently seen in left lung base. Mild  left pleural effusion may be present. Bony thorax is unremarkable. IMPRESSION: Endotracheal and nasogastric tubes have been removed. Stable bilateral lung opacities are noted consistent with multifocal pneumonia. Mild left pleural effusion is noted. Electronically Signed   By: Marijo Conception, M.D.   On: 02/25/2018 10:29   Dg Chest Port 1  View  Result Date: 02/22/2018 CLINICAL DATA:  Respiratory distress. Follow-up exam. Interstitial lung disease EXAM: PORTABLE CHEST 1 VIEW COMPARISON:  02/19/2018 and older exams. FINDINGS: Hazy, patchy areas of airspace lung opacity are superimposed on chronic interstitial thickening, without significant change compared to the most recent prior exam. There are no new lung abnormalities. No convincing pleural effusion.  No pneumothorax. Right subclavian central line has been removed. Left subclavian central venous line, endotracheal tube and enteric tube are stable. IMPRESSION: 1. No change in lung aeration since the most recent prior exam. Airspace opacities consistent with multifocal pneumonia are superimposed on chronic interstitial lung disease. 2. Remaining support apparatus is stable and well positioned. Electronically Signed   By: Lajean Manes M.D.   On: 02/22/2018 08:00   Dg Chest Port 1 View  Result Date: 02/19/2018 CLINICAL DATA:  Interstitial lung disease, evaluate endotracheal tube placement EXAM: PORTABLE CHEST 1 VIEW COMPARISON:  CT chest of 02/18/2018 and chest x-ray of 02/16/2017 FINDINGS: The tip of the endotracheal tube is approximately 5.8 cm above the carina. Airspace disease noted recently has improved somewhat in the interval. No pleural effusion is seen. Right central venous line tip overlies the upper SVC and a left central venous line tip overlies the lower SVC. The heart is within upper limits of normal and stable. Feeding tube extends below the hemidiaphragm. IMPRESSION: 1. Tip of endotracheal tube 5.8 cm above the carina. 2. Some improvement in patchy airspace disease bilaterally. Electronically Signed   By: Ivar Drape M.D.   On: 02/19/2018 14:09   Dg Chest Port 1 View  Result Date: 02/16/2018 CLINICAL DATA:  Acute respiratory failure with hypoxia EXAM: PORTABLE CHEST 1 VIEW COMPARISON:  Yesterday FINDINGS: Endotracheal tube tip between the clavicular heads and carina. An  orogastric tube at least reaches the pylorus. Bilateral central line with tip at the SVC. Unchanged asymmetric airspace disease, presumed infection in this immunocompromised patient. IMPRESSION: 1. Stable hardware positioning. The orogastric tube may intubate the duodenum. 2. Unchanged airspace disease. Electronically Signed   By: Monte Fantasia M.D.   On: 02/16/2018 07:12   Dg Chest Port 1 View  Result Date: 02/15/2018 CLINICAL DATA:  Intubation. History of COPD. EXAM: PORTABLE CHEST 1 VIEW COMPARISON:  02/14/2018 FINDINGS: Endotracheal tube with tip measuring 7 cm above the carina. Enteric tube tip is off the field of view but below the left hemidiaphragm. Bilateral central venous catheters with tips over the distal SVC region. No pneumothorax. Heart size is normal. There is patchy airspace disease throughout both lungs, demonstrating progression since previous study. No blunting of costophrenic angles. IMPRESSION: Appliances appear to be in satisfactory position. Diffuse bilateral airspace disease in the lungs, demonstrating progression since previous study. Electronically Signed   By: Lucienne Capers M.D.   On: 02/15/2018 22:25   Dg Chest Port 1 View  Result Date: 02/14/2018 CLINICAL DATA:  History of endotracheal tube EXAM: PORTABLE CHEST 1 VIEW COMPARISON:  Yesterday FINDINGS: Endotracheal tube tip between the clavicular heads and carina. An orogastric tube and side-port reaches the stomach. Gas is seen within the stomach although decreased from prior. Bilateral subclavian line with tip at the SVC. Diffuse interstitial  and airspace opacity, stable. Normal heart size and stable mediastinal contours. No effusion or pneumothorax. IMPRESSION: 1. Unremarkable hardware positioning. 2. Persistent airspace disease in the setting of immunocompromise status post recent BAL. Electronically Signed   By: Monte Fantasia M.D.   On: 02/14/2018 08:12   Dg Chest Port 1 View  Result Date: 02/13/2018 CLINICAL DATA:   History of ETT EXAM: PORTABLE CHEST 1 VIEW COMPARISON:  02/13/2018, 02/12/2018, 02/10/2018, CT chest 02/08/2017 FINDINGS: Endotracheal tube tip is about 4.5 cm superior to the carina. Esophageal tube tip is below the diaphragm but non included. Bilateral central venous catheter tips over the SVC. Stable cardiomediastinal silhouette. Interval worsening of interstitial and ground-glass opacity within the bilateral lungs. No pneumothorax. IMPRESSION: 1. Endotracheal tube tip about 4.5 cm superior to the carina. 2. Slight worsening of diffuse bilateral interstitial and ground-glass edema or infiltrates. Electronically Signed   By: Donavan Foil M.D.   On: 02/13/2018 23:39   Dg Chest Port 1 View  Result Date: 02/13/2018 CLINICAL DATA:  Endotracheal tube placement. EXAM: PORTABLE CHEST 1 VIEW COMPARISON:  02/12/2018 FINDINGS: Endotracheal tube has tip 7.3 cm above the carina. Left subclavian central venous catheter is unchanged. Interval placement of right subclavian central venous catheter with tip over the SVC. Lungs are adequately inflated demonstrate continued bilateral hazy interstitial process right worse than left with slight interval improved aeration. No effusion. No pneumothorax. Cardiomediastinal silhouette and remainder the exam is unchanged. IMPRESSION: Persistent bilateral hazy interstitial process with slight improved aeration. Tubes and lines as described.  No pneumothorax. Electronically Signed   By: Marin Olp M.D.   On: 02/13/2018 15:13   Dg Chest Port 1 View  Result Date: 02/12/2018 CLINICAL DATA:  Hypoxia. EXAM: PORTABLE CHEST 1 VIEW COMPARISON:  Prior exams over the last week with radiographs. Chest CT 02/08/2018. No remote exams available. FINDINGS: Left central line with tip in the SVC. Diffuse interstitial/ground-glass opacities with progression in the right suprahilar lung. Unchanged heart size and mediastinal contours. Again seen apical emphysema. No pneumothorax or pleural effusion.  IMPRESSION: Diffuse interstitial/ground-glass opacities with progression in the right suprahilar lung. Differential considerations include infectious or inflammatory etiologies or pulmonary edema. Given history of Sjogren's syndrome, interstitial lung disease is also considered. No remote exams available for comparison. Electronically Signed   By: Keith Rake M.D.   On: 02/12/2018 21:19   Dg Chest Port 1 View  Result Date: 02/10/2018 CLINICAL DATA:  Shortness of breath, respiratory failure EXAM: PORTABLE CHEST 1 VIEW COMPARISON:  02/08/2018 FINDINGS: Diffuse interstitial and alveolar opacities throughout the lungs. Heart is borderline in size. No visible effusions or acute bony abnormality. Minimal change since prior study. IMPRESSION: Continued bilateral diffuse pulmonary opacities which could reflect edema or pneumonia. Electronically Signed   By: Rolm Baptise M.D.   On: 02/10/2018 09:25   Dg Abd Portable 1v  Result Date: 02/18/2018 CLINICAL DATA:  Feeding tube placement. EXAM: PORTABLE ABDOMEN - 1 VIEW COMPARISON:  Radiograph of February 16, 2018. FINDINGS: The bowel gas pattern is normal. Distal tip of feeding tube appears to be in expected position of fourth portion of duodenum near the ligament of Treitz. No radio-opaque calculi or other significant radiographic abnormality are seen. IMPRESSION: Distal tip of feeding tube has been advanced into expected position of fourth portion of duodenum near ligament of Treitz. Electronically Signed   By: Marijo Conception, M.D.   On: 02/18/2018 10:56   Dg Abd Portable 1v  Result Date: 02/16/2018 CLINICAL DATA:  Check  feeding catheter placement EXAM: PORTABLE ABDOMEN - 1 VIEW COMPARISON:  02/15/2018 FINDINGS: Nasogastric catheter has been removed. A feeding catheter is now seen and appears to extend beyond the pylorus into the first portion of the duodenum. Persistent cecal prominence is noted. No other focal abnormality is seen. IMPRESSION: Feeding  catheter which appears to lie within the first portion of the duodenum. Electronically Signed   By: Inez Catalina M.D.   On: 02/16/2018 16:48   Dg Swallowing Func-speech Pathology  Result Date: 02/23/2018 Objective Swallowing Evaluation: Type of Study: MBS-Modified Barium Swallow Study  Patient Details Name: Carry Ortez MRN: [id number] Date of Birth: 1950-09-11 Today's Date: 02/23/2018 Time: SLP Start Time (ACUTE ONLY): 1033 -SLP Stop Time (ACUTE ONLY): 1042 SLP Time Calculation (min) (ACUTE ONLY): 9 min Past Medical History: Past Medical History: Diagnosis Date . Bronchiectasis ([place])  . CAD (coronary artery disease)  . COPD (chronic obstructive pulmonary disease) ([place])  . Depression with anxiety  . GERD (gastroesophageal reflux disease)  . Hypothyroidism  . Myasthenia gravis ([place])  . PE (pulmonary thromboembolism) ([place])  . Sjogren's disease ([place])  . Tobacco abuse  Past Surgical History: Past Surgical History: Procedure Laterality Date . Right ulnar nerve impingement   HPI: 67 year old male with PMH as below, which is significant for Myasthenia gravis on imuran, COPD, PE on Xarelto, and CAD. He presented to ED with worsening shortness of breath and workup revealed pneumonia. Pt required multiple intubations across 9 days with one self-extubation.  Subjective: 67 year old male with PMH as below, which is significant for Myasthenia gravis on imuran, COPD, PE on Xarelto, and CAD. He presented to ED with worsening shortness of breath and workup revealed pneumonia. Pt required multiple intubations across 9 days with one self-extubation. Assessment / Plan / Recommendation CHL IP CLINICAL IMPRESSIONS 02/23/2018 Clinical Impression Pt presents with functional oropharyngeal swallowing.  There was trace, transient laryngeal penetration during the swallow with serial straw sips of thin liquid, which was spontaneously cleared.  Pt exhibited prompt and efficient mastication and oral transit of solids.  With pill  simulation there was no stasis of tablet or increase penetration.  Tablet fully cleared on esophageal sweep.  Recommend regular texture diet with thin liquid. SLP Visit Diagnosis Dysphagia, unspecified (R13.10) Attention and concentration deficit following -- Frontal lobe and executive function deficit following -- Impact on safety and function No limitations   CHL IP TREATMENT RECOMMENDATION 02/23/2018 Treatment Recommendations Therapy as outlined in treatment plan below   Prognosis 02/23/2018 Prognosis for Safe Diet Advancement Good Barriers to Reach Goals -- Barriers/Prognosis Comment -- CHL IP DIET RECOMMENDATION 02/23/2018 SLP Diet Recommendations Regular solids;Thin liquid Liquid Administration via Cup;Straw Medication Administration Whole meds with liquid Compensations Minimize environmental distractions;Slow rate;Small sips/bites Postural Changes Seated upright at 90 degrees   CHL IP OTHER RECOMMENDATIONS 02/23/2018 Recommended Consults -- Oral Care Recommendations Oral care BID Other Recommendations --   No flowsheet data found.  CHL IP FREQUENCY AND DURATION 02/23/2018 Speech Therapy Frequency (ACUTE ONLY) min 1 x/week Treatment Duration 1 week      CHL IP ORAL PHASE 02/23/2018 Oral Phase WFL Oral - Pudding Teaspoon -- Oral - Pudding Cup -- Oral - Honey Teaspoon -- Oral - Honey Cup -- Oral - Nectar Teaspoon -- Oral - Nectar Cup -- Oral - Nectar Straw -- Oral - Thin Teaspoon -- Oral - Thin Cup -- Oral - Thin Straw -- Oral - Puree -- Oral - Mech Soft -- Oral - Regular -- Oral -  Multi-Consistency -- Oral - Pill -- Oral Phase - Comment --  CHL IP PHARYNGEAL PHASE 02/23/2018 Pharyngeal Phase Impaired Pharyngeal- Pudding Teaspoon -- Pharyngeal -- Pharyngeal- Pudding Cup -- Pharyngeal -- Pharyngeal- Honey Teaspoon -- Pharyngeal -- Pharyngeal- Honey Cup -- Pharyngeal -- Pharyngeal- Nectar Teaspoon -- Pharyngeal -- Pharyngeal- Nectar Cup -- Pharyngeal -- Pharyngeal- Nectar Straw -- Pharyngeal -- Pharyngeal- Thin Teaspoon  -- Pharyngeal -- Pharyngeal- Thin Cup [facility] Pharyngeal Material does not enter airway Pharyngeal- Thin Straw Delayed swallow initiation-vallecula;Penetration/Aspiration during swallow Pharyngeal Material enters airway, remains ABOVE vocal cords then ejected out Pharyngeal- Puree -- Pharyngeal -- Pharyngeal- Mechanical Soft -- Pharyngeal -- Pharyngeal- Regular -- Pharyngeal -- Pharyngeal- Multi-consistency -- Pharyngeal -- Pharyngeal- Pill -- Pharyngeal -- Pharyngeal Comment --  CHL IP CERVICAL ESOPHAGEAL PHASE 02/23/2018 Cervical Esophageal Phase WFL Pudding Teaspoon -- Pudding Cup -- Honey Teaspoon -- Honey Cup -- Nectar Teaspoon -- Nectar Cup -- Nectar Straw -- Thin Teaspoon -- Thin Cup -- Thin Straw -- Puree -- Mechanical Soft -- Regular -- Multi-consistency -- Pill -- Cervical Esophageal Comment -- Celedonio Savage, MA, [place] Office: [phone] 02/23/2018, 11:27 AM                LOS: 19 days   Oren Binet, MD  [facility]  If 7PM-7AM, please contact night-coverage  Please page via [email or web address] [email or web address]-click on MD name and type text message  02/27/2018, 11:58 AM

## 2018-02-27 NOTE — Evaluation (Signed)
Occupational Therapy Evaluation Patient Details Name: Curtis Preston MRN: [id number] DOB: 12/03/1950 Today's Date: 02/27/2018    History of Present Illness 67 yo admitted with SOB, multifocal PNA and sepsis. PMhx: myasthenia gravis, CoPD, PE, CAD Intubated 9/13 for hypoxia, Extubated 9/23. Remained in ICU until 9/26 for delrium    Clinical Impression   This 67 y/o male presents with the above. At baseline pt is independent with ADLs, iADLs and functional mobility. Pt presenting with significant weakness and deconditioning. Pt requiring modA for sit<>stand and able to perform stand pivot transfer to recliner with minA using RW this session. Pt requires extended rest break after completion of bed mobility prior to transfer. Pt on 5L supplemental O2 during session, lowest sat noted 84% on 5L after transfer completion and DOE 4/4, O2 sats rebounding to 90% and above within approx 1 min, seated rest and cues for pursed lip breathing. Pt currently requires minA for UB ADL, mod-maxA for LB ADLs. Pt will benefit from continued OT services. Pt from [place] and very much wishing to return to [place] so that he can return home. Feel pt is appropirate for CIR level services at time of discharge, will continue to follow acutely to assist with facilitating appropriate dispo plan, and to maximize pt's overall endurance, safety and independence with ADLs and mobility.     Follow Up Recommendations  CIR;Supervision/Assistance - 24 hour(pending pt agreeable to staying in [place] vs return to [place])    Equipment Recommendations  3 in 1 bedside commode           Precautions / Restrictions Precautions Precautions: Fall Precaution Comments: deconditioned, monitor O2 sats  Restrictions Weight Bearing Restrictions: No      Mobility Bed Mobility Overal bed mobility: Needs Assistance Bed Mobility: Supine to Sit     Supine to sit: Min guard     General bed mobility comments: minguard for safety, no physical  assist required, pt requires rest break after completion of bed mobility  Transfers Overall transfer level: Needs assistance Equipment used: Rolling walker (2 wheeled) Transfers: Sit to/from [employer] Sit to Stand: Mod assist Stand pivot transfers: Min assist       General transfer comment: modA to boost into standing from EOB, VCs for safe hand placement; pt able to take pivotal steps to recliner with min steading assist throughout    Balance Overall balance assessment: Needs assistance Sitting-balance support: Feet supported;Single extremity supported Sitting balance-Leahy Scale: Fair Sitting balance - Comments: able to sit without bil UE support      Standing balance-Leahy Scale: Poor Standing balance comment: requires UE support on RW                           ADL either performed or assessed with clinical judgement   ADL Overall ADL's : Needs assistance/impaired Eating/Feeding: Modified independent;Sitting   Grooming: Set up;Min guard;Sitting   Upper Body Bathing: Min guard;Sitting   Lower Body Bathing: Moderate assistance;Sit to/from stand   Upper Body Dressing : Min guard;Sitting   Lower Body Dressing: Moderate assistance;Sit to/from stand   Toilet Transfer: Minimal assistance;Stand-pivot;BSC;RW Statistician Details (indicate cue type and reason): simulated in transfer EOB to recliner Toileting- Clothing Manipulation and Hygiene: Moderate assistance;Sit to/from stand       Functional mobility during ADLs: Minimal assistance;Moderate assistance;Rolling walker(modA sit<>stand, minA for stand pivot) General ADL Comments: pt severely deconditioned; on 5L O2 during session, lowest sat noted at 84% after transfer  to recliner, taking approx 1 min to rebound to 90% and above; requires education and visual/verbal cues for completing pursed lip breathing                         Pertinent Vitals/Pain Pain Assessment: No/denies pain      Hand Dominance     Extremity/Trunk Assessment Upper Extremity Assessment Upper Extremity Assessment: Generalized weakness   Lower Extremity Assessment Lower Extremity Assessment: Defer to PT evaluation   Cervical / Trunk Assessment Cervical / Trunk Assessment: Kyphotic   Communication Communication Communication: No difficulties   Cognition Arousal/Alertness: Awake/alert Behavior During Therapy: Anxious Overall Cognitive Status: Impaired/Different from baseline Area of Impairment: Attention;Following commands;Awareness;Memory                   Current Attention Level: Selective Memory: Decreased short-term memory Following Commands: Follows multi-step commands inconsistently   Awareness: Emergent Problem Solving: Requires verbal cues;Requires tactile cues General Comments: pt appears to have increased awareness this session of need for assist and of current deficits; continues to require increased time and intermittent cues for following commands; pt also with some memory deficits, when telling therapist story pt noted to lose train of thought, forgetting name of friend he is referring to in story   General Comments       Exercises     Shoulder Instructions      Home Living Family/patient expects to be discharged to:: Private residence Living Arrangements: Spouse/significant other;Non-relatives/Friends Available Help at Discharge: Family Type of Home: House Home Access: Stairs to enter Secretary/administrator of Steps: 2 Entrance Stairs-Rails: None Home Layout: Two level;Able to live on main level with bedroom/bathroom;Laundry or work area in Artist of Steps: flight   Bathroom Shower/Tub: Chief Strategy Officer: Standard     Home Equipment: Environmental consultant - 2 wheels;Cane - single point   Additional Comments: fiancee, brother, son, grandkids all live with pt; pt from [place]      Prior Functioning/Environment Level  of Independence: Independent        Comments: no O2 prior, retired, works in the yard, driving        OT Problem List: Decreased strength;Decreased range of motion;Decreased activity tolerance;Impaired balance (sitting and/or standing);Cardiopulmonary status limiting activity      OT Treatment/Interventions: Self-care/ADL training;Therapeutic exercise;Neuromuscular education;DME and/or AE instruction;Therapeutic activities;Patient/family education;Balance training    OT Goals(Current goals can be found in the care plan section) Acute Rehab OT Goals Patient Stated Goal: go home  OT Goal Formulation: With patient Time For Goal Achievement: 03/13/18 Potential to Achieve Goals: Good  OT Frequency: Min 2X/week   Barriers to D/C:            Co-evaluation              AM-PAC PT "6 Clicks" Daily Activity     Outcome Measure Help from another person eating meals?: None Help from another person taking care of personal grooming?: A Little Help from another person toileting, which includes using toliet, bedpan, or urinal?: A Lot Help from another person bathing (including washing, rinsing, drying)?: A Lot Help from another person to put on and taking off regular upper body clothing?: A Little Help from another person to put on and taking off regular lower body clothing?: A Lot 6 Click Score: 16   End of Session Equipment Utilized During Treatment: Gait belt;Rolling walker;Oxygen Nurse Communication: Mobility status  Activity Tolerance: Patient tolerated treatment well;Patient limited  by fatigue Patient left: in chair;with call bell/phone within reach;with chair alarm set  OT Visit Diagnosis: Muscle weakness (generalized) (M62.81)                Time: 1610-9604 OT Time Calculation (min): 26 min Charges:  OT General Charges $OT Visit: 1 Visit OT Evaluation $OT Eval Moderate Complexity: 1 Mod OT Treatments $Self Care/Home Management : 8-22 mins  Marcy Siren,  OT Supplemental Rehabilitation Services Pager [phone] Office [phone]   Orlando Penner 02/27/2018, 3:31 PM

## 2018-02-27 NOTE — Progress Notes (Signed)
PULMONARY / CRITICAL CARE MEDICINE   NAME:  Curtis Preston, MRN:  [id number], DOB:  06/05/1950, LOS: 58 ADMISSION DATE:  02/06/2018, CONSULTATION DATE:  02/09/2018 REFERRING MD:  Algis Liming,   CHIEF COMPLAINT:  PNA in an immunocompromised patient  BRIEF HISTORY:    67 year old male with PMH as below, which is significant for Myasthenia gravis on imuran, COPD, PE on Xarelto, and CAD. He was in his usual state of health (gets winded daily) until about early August of this year, when he developed worsening shortness of breath with associated productive cough (green sputum). This persisted for about 6 weeks, until about 9/6, when his symptoms significant worsened after traveling here to [place] from [place] (where he lives). SOB worsened as did his productive cough, which caused him to present to ED. Workup in ED suggested pneumonia and he was started on empiric antibiotics. CT demonstrated bilateral ground glass, and most concerning for multi-focal pneumonia. Infectious disease consultation sees benefit in bronchoscopy for culture. PCCM consulted and recommended transfer to the intensive care unit for monitoring of respiratory status.  Transfer to ICU, underwent bronchoscopy which is negative.  Treated for community-acquired pneumonia He also underwent work-up for ILD with negative serologies for connective tissue disease  SIGNIFICANT EVENTS:  9/13 -Intubated for hypoxia.   9/13 Bronch BAL 9/19 Repeat bronch, BAL 9/22- Extubated 9/22, remains in ICU as he was delirious 9/27- Transfer out of ICU  SUBJECTIVE:  Feels well. Sitting up in chair. Still on 5 lt O2  CONSTITUTIONAL: BP 122/73 (BP Location: Right Arm)   Pulse 69   Temp 97.8 F (36.6 C)   Resp 18   Ht _0  (1.854 m)   Wt 71.6 kg   SpO2 98%   BMI 20.83 kg/m   I/O last 3 completed shifts: In: 110 [I.V.:110] Out: 2350 [Urine:2350]     PHYSICAL EXAM: Blood pressure 122/73, pulse 69, temperature 97.8 F (36.6 C), resp. rate  18, height _1  (1.854 m), weight 71.6 kg, SpO2 98 %. Gen:      No acute distress HEENT:  EOMI, sclera anicteric Neck:     No masses; no thyromegaly Lungs:    Clear to auscultation bilaterally; normal respiratory effort CV:         Regular rate and rhythm; no murmurs Abd:      + bowel sounds; soft, non-tender; no palpable masses, no distension Ext:    No edema; adequate peripheral perfusion Skin:      Warm and dry; no rash Neuro: alert and oriented x 3 Psych: normal mood and affect      ASSESSMENT AND PLAN   Hypoxemic respiratory failure.  Bilateral groundglass opacities secondary to community-acquired pneumonia in immunosuppressed patient Status post bronchoscopy with negative cultures Wean down oxygen as tolerated Completed antibiotics per ID  Subacute ILD.  CTD serologies are negative As myasthenic crisis ruled out, so chronic aspiration less likely. CVTS has declined lung biopsy.  Repeat CT chest and BAL as part of autoimmune work up.  ALL cultures including PJP negative. Smear negative x2 - off isolation. Given negative microbiological studies, have started trial of steroids Now on prednisone of 60 mg. Recommend slow taper by 10 mg every week  Myasthenia Gravis. Neurology note appreciated; not in myasthenic crisis and does not need plasma exchange;  Holding outpatient imuran  Hx recurrent VTE and PE. Continue xarelto  Encephalopathy, ICU delirium Improving.  Holding outpatient sedating medications.  PCCM will be available as needed during this admission.  Please call with questions.  If he wishes we can arrange follow up in clinic here otherwise he can follow up with his medical team in [place].   SUMMARY OF TODAY'S PLAN:   Started on slow steroid taper Wean down O2  Best Practice / Goals of Care / Disposition.   DVT PROPHYLAXIS: Xarelto. NUTRITION: PO diet MOBILITY: PT GOALS OF CARE:  Full code  LABS  Relevant labs and investigations personally reviewed. Labs  stable except for leukocytosis  Chest x-ray personally reviewed 9/26 shows  Stable diffuse airspace disease All respiratory cultures are negative so far with no organisms seen. AFB from deep tracheal aspirate is negative x2  Cell count from bronchoscopy showed small number of neutrophil predominant WBC. Reported organisms, but negative gram stain.  Marshell Garfinkel MD [facility] 02/27/2018, 3:11 PM

## 2018-02-28 ENCOUNTER — Inpatient Hospital Stay (HOSPITAL_COMMUNITY): Payer: Medicare HMO

## 2018-02-28 DIAGNOSIS — R5381 Other malaise: Secondary | ICD-10-CM

## 2018-02-28 DIAGNOSIS — J9600 Acute respiratory failure, unspecified whether with hypoxia or hypercapnia: Secondary | ICD-10-CM

## 2018-02-28 DIAGNOSIS — K210 Gastro-esophageal reflux disease with esophagitis: Secondary | ICD-10-CM

## 2018-02-28 DIAGNOSIS — I2510 Atherosclerotic heart disease of native coronary artery without angina pectoris: Secondary | ICD-10-CM

## 2018-02-28 LAB — BASIC METABOLIC PANEL
Anion gap: 9 (ref 5–15)
BUN: 17 mg/dL (ref 8–23)
CO2: 26 mmol/L (ref 22–32)
CREATININE: 0.7 mg/dL (ref 0.61–1.24)
Calcium: 8 mg/dL — ABNORMAL LOW (ref 8.9–10.3)
Chloride: 104 mmol/L (ref 98–111)
GFR calc Af Amer: >60 mL/min (ref >60)
GFR calc non Af Amer: >60 mL/min (ref >60)
GLUCOSE: 87 mg/dL (ref 70–99)
Potassium: 3.4 mmol/L — ABNORMAL LOW (ref 3.5–5.1)
Sodium: 139 mmol/L (ref 135–145)

## 2018-02-28 LAB — CBC
HCT: 35.7 % — ABNORMAL LOW (ref 39.0–52.0)
Hemoglobin: 11.4 g/dL — ABNORMAL LOW (ref 13.0–17.0)
MCH: 31.4 pg (ref 26.0–34.0)
MCHC: 31.9 g/dL (ref 30.0–36.0)
MCV: 98.3 fL (ref 78.0–100.0)
PLATELETS: 545 10*3/uL — AB (ref 150–400)
RBC: 3.63 MIL/uL — ABNORMAL LOW (ref 4.22–5.81)
RDW: 13.4 % (ref 11.5–15.5)
WBC: 14.4 10*3/uL — ABNORMAL HIGH (ref 4.0–10.5)

## 2018-02-28 LAB — BRAIN NATRIURETIC PEPTIDE: B Natriuretic Peptide: 21.9 pg/mL (ref 0.0–100.0)

## 2018-02-28 MED ORDER — POTASSIUM CHLORIDE CRYS ER 20 MEQ PO TBCR
40.0000 meq | EXTENDED_RELEASE_TABLET | Freq: Once | ORAL | Status: AC
  Administered 2018-02-28: 40 meq via ORAL
  Filled 2018-02-28: qty 2

## 2018-02-28 MED ORDER — FUROSEMIDE 10 MG/ML IJ SOLN
40.0000 mg | Freq: Once | INTRAMUSCULAR | Status: AC
  Administered 2018-02-28: 40 mg via INTRAVENOUS
  Filled 2018-02-28: qty 4

## 2018-02-28 NOTE — Progress Notes (Signed)
PROGRESS NOTE        PATIENT DETAILS Name: Curtis Preston Age: 67 y.o. Sex: male Date of Birth: 1950/10/09 Admit Date: 02/06/2018 Admitting Physician Ivor Costa, MD PCP:No primary care provider on file.  Brief Narrative: Patient is a 67 y.o. male with history of myasthenia gravis on Imuran, PE on Xarelto, COPD not on home O2 who originally is from [place] and is here in town for his brother-in-law's funeral, he developed worsening shortness of breath and cough (started since August but worsened acutely)-he subsequently presented to the ED and was found to have pneumonia and started on empiric antibiotics.  Ultimately patient worsened and required intubation on 9/13.  While in the ICU, patient underwent bronchoscopy with negative BAL.  Upon clinical stability he was extubated and transferred to the trial hospitalist service on 9/26.  Hospital course has been complicated by ICU delirium, severe deconditioning and ongoing hypoxia but gradually improving.  See below for further details  Significant events: 9/13 - intubated 9/13 - bronch with BAL 9/19 - repeated bronch with BAL 9/22 - extubated, remained in the ICU due to delirium   Subjective:  Patient in bed, appears comfortable, denies any headache, no fever, no chest pain or pressure, still has shortness of breath , no abdominal pain. No focal weakness.  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  Slowly taper  off steroids.  Leukocytosis: Likely secondary to steroids-slowly improving-completed a course of antimicrobial therapy.  Follow.  Myasthenia gravis: Seen by neurology-not felt to have myasthenic crisis-Imuran remains on hold.  Continue Mestinon.  COPD: Not Wheezing-moving air well-continue bronchodilators  Hypothyroidism: Continue Synthroid  History of pulmonary embolism: Continue rivaroxaban.  ICU delirium: Resolved  Severe deconditioning: Continue PT, may require SNF versus CIR.  Eager to be discharged home to [place].  Plan is to drive back which is clearly not safe.  Patient counseled.    DVT Prophylaxis: Full dose anticoagulation with Xarelto  Code Status: Full code   Family Communication: None at bedside  Disposition Plan: Likely discharge in the next 1 to 2 days to SNF if week or home if physically able to  Antimicrobial agents: Anti-infectives (From admission, onward)   Start     Dose/Rate Route Frequency Ordered Stop   02/14/18 1400  imipenem-cilastatin (PRIMAXIN) 750 mg in sodium chloride 0.9 % 100 mL IVPB     750 mg 200 mL/hr over 30 Minutes Intravenous Every 8 hours 02/14/18 1031 02/17/18 2322   02/13/18 1330  sulfamethoxazole-trimethoprim (BACTRIM) 370 mg in dextrose 5 % 500 mL IVPB     370 mg 348.8 mL/hr over 90 Minutes Intravenous Every 8 hours 02/13/18 1238 02/17/18 2332   02/09/18 1600  ciprofloxacin (CIPRO) IVPB 400 mg  Status:  Discontinued     400 mg 200 mL/hr over 60 Minutes Intravenous Every 12 hours 02/09/18 1516 02/13/18 1140   02/09/18 1200  ciprofloxacin (CIPRO) tablet 500 mg  Status:  Discontinued     500 mg Oral 2 times daily 02/09/18 1159 02/09/18 1211   02/08/18 0100  vancomycin (VANCOCIN) IVPB 1000 mg/200 mL premix  Status:  Discontinued     1,000 mg 200 mL/hr over 60 Minutes Intravenous Every 24 hours 02/07/18 0107 02/09/18 1159   02/08/18 0000  azithromycin (ZITHROMAX) 250 mg in dextrose 5 % 125 mL IVPB  Status:  Discontinued     250  mg 125 mL/hr over 60 Minutes Intravenous Every 24 hours 02/07/18 0032 02/09/18 1159   02/07/18 0600  ceFEPIme (MAXIPIME) 1 g in sodium chloride 0.9 % 100 mL IVPB  Status:  Discontinued     1 g 200 mL/hr over 30 Minutes Intravenous Every 8 hours 02/07/18 0032 02/09/18 1159   02/07/18 0045  ceFEPIme (MAXIPIME) 1 g in sodium chloride 0.9 % 100 mL IVPB  Status:  Discontinued     1 g 200 mL/hr over 30 Minutes Intravenous Every 8 hours 02/07/18 0031 02/07/18 0032   02/07/18 0045  vancomycin (VANCOCIN) 1,500 mg in sodium chloride 0.9 % 500 mL IVPB     1,500 mg 250 mL/hr over 120 Minutes Intravenous  Once 02/07/18 0042 02/07/18 0559   02/06/18 2330  cefTRIAXone (ROCEPHIN) 1 g in sodium chloride 0.9 % 100 mL IVPB     1 g 200 mL/hr over 30 Minutes Intravenous  Once 02/06/18 2326 02/07/18 0048   02/06/18 2330  azithromycin (ZITHROMAX) 500 mg in sodium chloride 0.9 % 250 mL IVPB     500 mg 250 mL/hr over 60 Minutes Intravenous  Once 02/06/18 2326 02/07/18 0201      Procedures: 9/13 -Intubated for hypoxia.  9/13 Bronch BAL 9/19 Repeat bronch, BAL  CONSULTS:  pulmonary/intensive care, neurology and CTVS  Time spent: 25- minutes-Greater than 50% of this time was spent in counseling, explanation of diagnosis, planning of further management, and coordination of care.  MEDICATIONS: Scheduled Meds: . arformoterol  15 mcg Nebulization BID  . aspirin  81 mg Oral Daily  . atorvastatin  40 mg Oral QHS  . baclofen  10 mg Oral TID  . calcium-vitamin D  1 tablet Oral Daily  . Chlorhexidine Gluconate Cloth  6 each Topical Daily  . feeding supplement (ENSURE ENLIVE)  237 mL Oral TID BM  . levothyroxine  175 mcg Oral QAC breakfast  . mouth rinse  15 mL Mouth Rinse BID  . mirtazapine  15 mg Oral QHS  . montelukast  10 mg Oral QHS  . pantoprazole  40 mg Oral Q1200  . potassium chloride  40 mEq Oral Once  . predniSONE  60 mg Oral Q breakfast  . pregabalin  50 mg Oral QHS  . pyridostigmine  30 mg  Oral QID  . rivaroxaban  20 mg Oral Q supper  . senna-docusate  1 tablet Oral BID  . tiotropium  18 mcg Inhalation Daily  . vitamin B-12  1,000 mcg Oral Daily   Continuous Infusions: . sodium chloride 10 mL/hr at 02/23/18 0600   PRN Meds:.sodium chloride, acetaminophen (TYLENOL) oral liquid 160 mg/5 mL, albuterol, polyethylene glycol   PHYSICAL EXAM: Vital signs: Vitals:   02/27/18 2122 02/28/18 0431 02/28/18 0500 02/28/18 0827  BP: 103/61 (!) 105/58    Pulse: 73 65  70  Resp: _0 Temp: 98.1 F (36.7 C) 97.7 F (36.5 C)    TempSrc: Oral Oral    SpO2: 95% 97%  97%  Weight:   68.6 kg   Height:  Filed Weights   02/26/18 0500 02/27/18 0526 02/28/18 0500  Weight: 74.5 kg 71.6 kg 68.6 kg   Body mass index is 19.95 kg/m.   Exam  Frail elderly white male, awake Alert, Oriented X 3, No new F.N deficits, Normal affect [place].AT,PERRAL Supple Neck,No JVD, No cervical lymphadenopathy appriciated.  Symmetrical Chest wall movement, Good air movement bilaterally, +ve rales RRR,No Gallops, Rubs or new Murmurs, No Parasternal Heave +ve B.Sounds, Abd Soft, No tenderness, No organomegaly appriciated, No rebound - guarding or rigidity. No Cyanosis, Clubbing or edema, No new Rash or bruise  I have personally reviewed following labs and imaging studies  LABORATORY DATA: CBC: Recent Labs  Lab 02/24/18 0420 02/25/18 0734 02/26/18 0528 02/27/18 0331 02/28/18 0757  WBC 10.5 12.9* 16.0* 15.9* 14.4*  NEUTROABS  --  8.4*  --  10.7*  --   HGB 9.8* 10.3* 11.1* 10.9* 11.4*  HCT 30.5* 31.8* 34.1* 34.1* 35.7*  MCV 98.4 97.8 98.0 97.2 98.3  PLT 560* 562* 578* 560* 545*    Basic Metabolic Panel: Recent Labs  Lab 02/22/18 0247 02/24/18 0420 02/25/18 0734 02/26/18 0528 02/27/18 0331 02/28/18 0757  NA 144 143 140 140 139 139  K 4.0 3.2* 3.6 3.4* 3.6 3.4*  CL 104 105 104 111 103 104  CO2 _0 GLUCOSE 111* 97 88 85 99 87  BUN 38* 27* _1 CREATININE  0.63 0.68 0.72 0.64 0.73 0.70  CALCIUM 8.0* 8.1* 7.7* 7.2* 8.1* 8.0*  MG 1.9 1.9 1.9 1.7 1.9  --   PHOS  --  3.0 3.3 2.9  --   --     GFR: Estimated Creatinine Clearance: 84.6 mL/min (by C-G formula based on SCr of 0.7 mg/dL).  Liver Function Tests: No results for input(s): AST, ALT, ALKPHOS, BILITOT, PROT, ALBUMIN in the last 168 hours. No results for input(s): LIPASE, AMYLASE in the last 168 hours. No results for input(s): AMMONIA in the last 168 hours.  Coagulation Profile: No results for input(s): INR, PROTIME in the last 168 hours.  Cardiac Enzymes: No results for input(s): CKTOTAL, CKMB, CKMBINDEX, TROPONINI in the last 168 hours.  BNP (last 3 results) No results for input(s): PROBNP in the last 8760 hours.  HbA1C: No results for input(s): HGBA1C in the last 72 hours.  CBG: Recent Labs  Lab 02/25/18 0445 02/25/18 0803 02/25/18 1103 02/25/18 1511 02/25/18 1926  GLUCAP 81 71 83 142* 97    Lipid Profile: No results for input(s): CHOL, HDL, LDLCALC, TRIG, CHOLHDL, LDLDIRECT in the last 72 hours.  Thyroid Function Tests: No results for input(s): TSH, T4TOTAL, FREET4, T3FREE, THYROIDAB in the last 72 hours.  Anemia Panel: No results for input(s): VITAMINB12, FOLATE, FERRITIN, TIBC, IRON, RETICCTPCT in the last 72 hours.  Urine analysis:    Component Value Date/Time   COLORURINE YELLOW 02/20/2018 0508   APPEARANCEUR CLEAR 02/20/2018 0508   LABSPEC 1.021 02/20/2018 0508   PHURINE 5.0 02/20/2018 0508   GLUCOSEU NEGATIVE 02/20/2018 0508   HGBUR MODERATE (A) 02/20/2018 0508   BILIRUBINUR NEGATIVE 02/20/2018 0508   KETONESUR NEGATIVE 02/20/2018 0508   PROTEINUR NEGATIVE 02/20/2018 0508   NITRITE NEGATIVE 02/20/2018 0508   LEUKOCYTESUR NEGATIVE 02/20/2018 0508    Sepsis Labs: Lactic Acid, Venous    Component Value Date/Time   LATICACIDVEN 0.6 02/20/2018 0619    MICROBIOLOGY: Recent Results (from the past 240 hour(s))  Gram stain     Status: None    Collection Time:  02/19/18 10:31 AM  Result Value Ref Range Status   Specimen Description BRONCHIAL ALVEOLAR LAVAGE  Final   Special Requests NONE  Final   Gram Stain   Final    ABUNDANT WBC PRESENT, PREDOMINANTLY PMN NO ORGANISMS SEEN Gram Stain Report Called to,Read Back By and Verified With: J POOLE,RN AT 1148 02/20/18 BY L BENFIELD Performed at [facility], [place] [address] [place], [place] [address]    Report Status 02/20/2018 FINAL  Final  Culture, blood (Routine X 2) w Reflex to ID Panel     Status: None   Collection Time: 02/20/18  3:50 AM  Result Value Ref Range Status   Specimen Description BLOOD LEFT ANTECUBITAL  Final   Special Requests   Final    BOTTLES DRAWN AEROBIC ONLY Blood Culture results may not be optimal due to an inadequate volume of blood received in culture bottles   Culture   Final    NO GROWTH 5 DAYS Performed at [facility], [place] [address], [place], [place] [address]    Report Status 02/25/2018 FINAL  Final  Culture, blood (Routine X 2) w Reflex to ID Panel     Status: Abnormal   Collection Time: 02/20/18  3:57 AM  Result Value Ref Range Status   Specimen Description BLOOD RIGHT ANTECUBITAL  Final   Special Requests   Final    BOTTLES DRAWN AEROBIC ONLY Blood Culture adequate volume   Culture  Setup Time   Final    GRAM POSITIVE COCCI AEROBIC BOTTLE ONLY CRITICAL RESULT CALLED TO, READ BACK BY AND VERIFIED WITH: L SEAY PHARMD 0401 02/21/18 A BROWNING    Culture (A)  Final    VIRIDANS STREPTOCOCCUS THE SIGNIFICANCE OF ISOLATING THIS ORGANISM FROM A SINGLE SET OF BLOOD CULTURES WHEN MULTIPLE SETS ARE DRAWN IS UNCERTAIN. PLEASE NOTIFY THE MICROBIOLOGY DEPARTMENT WITHIN ONE WEEK IF SPECIATION AND SENSITIVITIES ARE REQUIRED. Performed at [facility], [place] [address], [place], [place] [address]    Report Status 02/24/2018 FINAL  Final  Blood Culture ID Panel (Reflexed)     Status: Abnormal   Collection Time: 02/20/18  3:57 AM  Result  Value Ref Range Status   Enterococcus species NOT DETECTED NOT DETECTED Final   Listeria monocytogenes NOT DETECTED NOT DETECTED Final   Staphylococcus species NOT DETECTED NOT DETECTED Final   Staphylococcus aureus NOT DETECTED NOT DETECTED Final   Streptococcus species DETECTED (A) NOT DETECTED Final    Comment: Not Enterococcus species, Streptococcus agalactiae, Streptococcus pyogenes, or Streptococcus pneumoniae. CRITICAL RESULT CALLED TO, READ BACK BY AND VERIFIED WITH: L SEAY PHARMD 0401 02/21/18 A BROWNING    Streptococcus agalactiae NOT DETECTED NOT DETECTED Final   Streptococcus pneumoniae NOT DETECTED NOT DETECTED Final   Streptococcus pyogenes NOT DETECTED NOT DETECTED Final   Acinetobacter baumannii NOT DETECTED NOT DETECTED Final   Enterobacteriaceae species NOT DETECTED NOT DETECTED Final   Enterobacter cloacae complex NOT DETECTED NOT DETECTED Final   Escherichia coli NOT DETECTED NOT DETECTED Final   Klebsiella oxytoca NOT DETECTED NOT DETECTED Final   Klebsiella pneumoniae NOT DETECTED NOT DETECTED Final   Proteus species NOT DETECTED NOT DETECTED Final   Serratia marcescens NOT DETECTED NOT DETECTED Final   Haemophilus influenzae NOT DETECTED NOT DETECTED Final   Neisseria meningitidis NOT DETECTED NOT DETECTED Final   Pseudomonas aeruginosa NOT DETECTED NOT DETECTED Final   Candida albicans NOT DETECTED NOT DETECTED Final   Candida glabrata NOT DETECTED NOT DETECTED Final  Candida krusei NOT DETECTED NOT DETECTED Final   Candida parapsilosis NOT DETECTED NOT DETECTED Final   Candida tropicalis NOT DETECTED NOT DETECTED Final    Comment: Performed at [facility], [place] [address], [place], [place] [address]  Culture, Urine     Status: None   Collection Time: 02/20/18  5:08 AM  Result Value Ref Range Status   Specimen Description URINE, RANDOM  Final   Special Requests NONE  Final   Culture   Final    NO GROWTH Performed at [facility], [place]  [address], [place], [place] [address]    Report Status 02/21/2018 FINAL  Final    RADIOLOGY STUDIES/RESULTS: Dg Chest 2 View  Result Date: 02/08/2018 CLINICAL DATA:  Chest pain and shortness of breath EXAM: CHEST - 2 VIEW COMPARISON:  February 06, 2018 FINDINGS: The left central line is stable. There appears to be a small bulla in the left apex. No pneumothorax. Bilateral pulmonary opacities most prominent in the mid lower lungs. No other interval changes or acute abnormalities. IMPRESSION: 1. Stable left Port-A-Cath. 2. Bilateral pulmonary opacities may represent worsening edema versus worsening bilateral pneumonia. Recommend clinical correlation and follow-up to resolution. Electronically Signed   By: Dorise Bullion III M.D   On: 02/08/2018 07:08   Dg Chest 2 View  Result Date: 02/06/2018 CLINICAL DATA:  Shortness of breath cough and congestion for 3 days. EXAM: CHEST - 2 VIEW COMPARISON:  None. FINDINGS: Left subclavian injectable port terminates at the expected location of the cavoatrial junction. Cardiomediastinal silhouette is normal. Mediastinal contours appear intact. There is no evidence of pleural effusion or pneumothorax. Patchy interstitial and alveolar opacities in bilateral lungs, with particular prominence in the right upper lobe, right middle lobe and left lower lobe. Possible pulmonary nodule in the right mid lung field. Osseous structures are without acute abnormality. Soft tissues are grossly normal. IMPRESSION: Patchy interstitial and alveolar opacities in bilateral lungs. Findings may represent multifocal pneumonia or developing asymmetric pulmonary edema. Possible pulmonary nodule in the right mid lung field. Electronically Signed   By: Fidela Salisbury M.D.   On: 02/06/2018 21:12   Dg Abd 1 View  Result Date: 02/15/2018 CLINICAL DATA:  Nausea and vomiting EXAM: ABDOMEN - 1 VIEW COMPARISON:  None. FINDINGS: Scattered large and small bowel gas is noted. Cecum is mildly prominent at  almost 12 cm. No free air is seen. Nasogastric catheter is noted within the stomach. No bony abnormality is seen. No mass lesion is noted. IMPRESSION: Gas dilated cecum to 12 cm. Nasogastric catheter within the stomach. Electronically Signed   By: Inez Catalina M.D.   On: 02/15/2018 09:46   Ct Chest Wo Contrast  Result Date: 02/18/2018 CLINICAL DATA:  Increased secretions, ventilator dependent. EXAM: CT CHEST WITHOUT CONTRAST TECHNIQUE: Multidetector CT imaging of the chest was performed following the standard protocol without IV contrast. COMPARISON:  Radiograph of February 16, 2018. CT scan of February 08, 2018. FINDINGS: Cardiovascular: Atherosclerosis of thoracic aorta is noted without aneurysm formation. Coronary artery calcifications are noted. Normal cardiac size. No pericardial effusion. Mediastinum/Nodes: Feeding tube is seen passing through esophagus into stomach. Significantly increased mediastinal adenopathy is noted compared to prior exam, most likely inflammatory in etiology; this includes 14 mm precarinal lymph node. 11 mm right paratracheal lymph node is noted and and increased compared to prior exam. 12 mm left hilar lymph node is noted which is enlarged compared to prior exam. Tracheostomy tube is well positioned. Lungs/Pleura: No  pneumothorax is noted. Emphysematous disease is again noted. Significantly increased lung opacities are noted bilaterally and diffusely most consistent with worsening pneumonia. No significant pleural effusion is noted. Upper Abdomen: No acute abnormality. Musculoskeletal: No chest wall mass or suspicious bone lesions identified. IMPRESSION: Significantly increased airspace opacities are noted throughout both lungs consistent with severely worsening pneumonia. Increased mediastinal adenopathy is also noted most likely is inflammatory in etiology. Coronary artery calcifications are noted. Aortic Atherosclerosis (ICD10-I70.0) and Emphysema (ICD10-J43.9). Electronically  Signed   By: Marijo Conception, M.D.   On: 02/18/2018 16:50   Ct Chest Wo Contrast  Result Date: 02/08/2018 CLINICAL DATA:  Progressive hypoxemia.  Pneumonia. EXAM: CT CHEST WITHOUT CONTRAST TECHNIQUE: Multidetector CT imaging of the chest was performed following the standard protocol without IV contrast. COMPARISON:  Chest x-ray 02/08/2018 FINDINGS: Cardiovascular: Heart size is normal. Atherosclerotic calcifications are present coronary arteries. Atherosclerotic changes are present at the aortic arch. Pulmonary arteries mildly prominent bilaterally. No significant pericardial effusion is present. Mediastinum/Nodes: No significant mediastinal, axillary, or hilar adenopathy is present. The esophagus is within normal limits. The thoracic inlet is unremarkable. Lungs/Pleura: Extensive confluent ground-glass attenuation is present throughout the lower lobes, the lingula, and right middle lobe. Consolidation is most evident in the superior segment of the left lower lobe. Extensive centrilobular emphysematous changes are present. No significant pleural effusion is present. Upper Abdomen: Unremarkable. Musculoskeletal: A remote superior endplate fractures present at T7. Vertebral body heights alignment are otherwise maintained. No focal lytic or blastic lesions are present. Ribs are within normal limits. IMPRESSION: 1. Extensive confluent ground-glass attenuation throughout both lungs with consolidation more prevalent in the superior segment of the left lower lobe. The findings are nonspecific, but most concerning for multi lobar pneumonia. Edema or possibly could have a similar appearance. This is to extensive for atelectasis. Consolidation in the superior segment in the left lower lobe suggests infection. 2.  Aortic Atherosclerosis (ICD10-I70.0). 3.  Emphysema (ICD10-J43.9). 4. Coronary artery disease Electronically Signed   By: San Morelle M.D.   On: 02/08/2018 11:47   Dg Chest Port 1 View  Result Date:  02/26/2018 CLINICAL DATA:  Acute respiratory failure EXAM: PORTABLE CHEST 1 VIEW COMPARISON:  10/25/2017 FINDINGS: Left subclavian Port-A-Cath is stable. The heart is borderline enlarged. Patchy bilateral airspace disease right greater than left is stable. No pneumothorax. No pleural effusion. IMPRESSION: Stable bilateral airspace disease right greater than left. Electronically Signed   By: Marybelle Killings M.D.   On: 02/26/2018 09:26   Dg Chest Port 1 View  Result Date: 02/25/2018 CLINICAL DATA:  Respiratory failure, shortness of breath. EXAM: PORTABLE CHEST 1 VIEW COMPARISON:  Radiograph of February 22, 2018. FINDINGS: Stable cardiomediastinal silhouette. Endotracheal and nasogastric tubes have been removed. Left subclavian Port-A-Cath is unchanged in position. No pneumothorax is noted. Stable bilateral lung opacities are noted concerning for multifocal pneumonia, most prominently seen in left lung base. Mild left pleural effusion may be present. Bony thorax is unremarkable. IMPRESSION: Endotracheal and nasogastric tubes have been removed. Stable bilateral lung opacities are noted consistent with multifocal pneumonia. Mild left pleural effusion is noted. Electronically Signed   By: Marijo Conception, M.D.   On: 02/25/2018 10:29   Dg Chest Port 1 View  Result Date: 02/22/2018 CLINICAL DATA:  Respiratory distress. Follow-up exam. Interstitial lung disease EXAM: PORTABLE CHEST 1 VIEW COMPARISON:  02/19/2018 and older exams. FINDINGS: Hazy, patchy areas of airspace lung opacity are superimposed on chronic interstitial thickening, without significant change compared to the most  recent prior exam. There are no new lung abnormalities. No convincing pleural effusion.  No pneumothorax. Right subclavian central line has been removed. Left subclavian central venous line, endotracheal tube and enteric tube are stable. IMPRESSION: 1. No change in lung aeration since the most recent prior exam. Airspace opacities consistent  with multifocal pneumonia are superimposed on chronic interstitial lung disease. 2. Remaining support apparatus is stable and well positioned. Electronically Signed   By: Lajean Manes M.D.   On: 02/22/2018 08:00   Dg Chest Port 1 View  Result Date: 02/19/2018 CLINICAL DATA:  Interstitial lung disease, evaluate endotracheal tube placement EXAM: PORTABLE CHEST 1 VIEW COMPARISON:  CT chest of 02/18/2018 and chest x-ray of 02/16/2017 FINDINGS: The tip of the endotracheal tube is approximately 5.8 cm above the carina. Airspace disease noted recently has improved somewhat in the interval. No pleural effusion is seen. Right central venous line tip overlies the upper SVC and a left central venous line tip overlies the lower SVC. The heart is within upper limits of normal and stable. Feeding tube extends below the hemidiaphragm. IMPRESSION: 1. Tip of endotracheal tube 5.8 cm above the carina. 2. Some improvement in patchy airspace disease bilaterally. Electronically Signed   By: Ivar Drape M.D.   On: 02/19/2018 14:09   Dg Chest Port 1 View  Result Date: 02/16/2018 CLINICAL DATA:  Acute respiratory failure with hypoxia EXAM: PORTABLE CHEST 1 VIEW COMPARISON:  Yesterday FINDINGS: Endotracheal tube tip between the clavicular heads and carina. An orogastric tube at least reaches the pylorus. Bilateral central line with tip at the SVC. Unchanged asymmetric airspace disease, presumed infection in this immunocompromised patient. IMPRESSION: 1. Stable hardware positioning. The orogastric tube may intubate the duodenum. 2. Unchanged airspace disease. Electronically Signed   By: Monte Fantasia M.D.   On: 02/16/2018 07:12   Dg Chest Port 1 View  Result Date: 02/15/2018 CLINICAL DATA:  Intubation. History of COPD. EXAM: PORTABLE CHEST 1 VIEW COMPARISON:  02/14/2018 FINDINGS: Endotracheal tube with tip measuring 7 cm above the carina. Enteric tube tip is off the field of view but below the left hemidiaphragm. Bilateral  central venous catheters with tips over the distal SVC region. No pneumothorax. Heart size is normal. There is patchy airspace disease throughout both lungs, demonstrating progression since previous study. No blunting of costophrenic angles. IMPRESSION: Appliances appear to be in satisfactory position. Diffuse bilateral airspace disease in the lungs, demonstrating progression since previous study. Electronically Signed   By: Lucienne Capers M.D.   On: 02/15/2018 22:25   Dg Chest Port 1 View  Result Date: 02/14/2018 CLINICAL DATA:  History of endotracheal tube EXAM: PORTABLE CHEST 1 VIEW COMPARISON:  Yesterday FINDINGS: Endotracheal tube tip between the clavicular heads and carina. An orogastric tube and side-port reaches the stomach. Gas is seen within the stomach although decreased from prior. Bilateral subclavian line with tip at the SVC. Diffuse interstitial and airspace opacity, stable. Normal heart size and stable mediastinal contours. No effusion or pneumothorax. IMPRESSION: 1. Unremarkable hardware positioning. 2. Persistent airspace disease in the setting of immunocompromise status post recent BAL. Electronically Signed   By: Monte Fantasia M.D.   On: 02/14/2018 08:12   Dg Chest Port 1 View  Result Date: 02/13/2018 CLINICAL DATA:  History of ETT EXAM: PORTABLE CHEST 1 VIEW COMPARISON:  02/13/2018, 02/12/2018, 02/10/2018, CT chest 02/08/2017 FINDINGS: Endotracheal tube tip is about 4.5 cm superior to the carina. Esophageal tube tip is below the diaphragm but non included. Bilateral central venous  catheter tips over the SVC. Stable cardiomediastinal silhouette. Interval worsening of interstitial and ground-glass opacity within the bilateral lungs. No pneumothorax. IMPRESSION: 1. Endotracheal tube tip about 4.5 cm superior to the carina. 2. Slight worsening of diffuse bilateral interstitial and ground-glass edema or infiltrates. Electronically Signed   By: Donavan Foil M.D.   On: 02/13/2018 23:39    Dg Chest Port 1 View  Result Date: 02/13/2018 CLINICAL DATA:  Endotracheal tube placement. EXAM: PORTABLE CHEST 1 VIEW COMPARISON:  02/12/2018 FINDINGS: Endotracheal tube has tip 7.3 cm above the carina. Left subclavian central venous catheter is unchanged. Interval placement of right subclavian central venous catheter with tip over the SVC. Lungs are adequately inflated demonstrate continued bilateral hazy interstitial process right worse than left with slight interval improved aeration. No effusion. No pneumothorax. Cardiomediastinal silhouette and remainder the exam is unchanged. IMPRESSION: Persistent bilateral hazy interstitial process with slight improved aeration. Tubes and lines as described.  No pneumothorax. Electronically Signed   By: Marin Olp M.D.   On: 02/13/2018 15:13   Dg Chest Port 1 View  Result Date: 02/12/2018 CLINICAL DATA:  Hypoxia. EXAM: PORTABLE CHEST 1 VIEW COMPARISON:  Prior exams over the last week with radiographs. Chest CT 02/08/2018. No remote exams available. FINDINGS: Left central line with tip in the SVC. Diffuse interstitial/ground-glass opacities with progression in the right suprahilar lung. Unchanged heart size and mediastinal contours. Again seen apical emphysema. No pneumothorax or pleural effusion. IMPRESSION: Diffuse interstitial/ground-glass opacities with progression in the right suprahilar lung. Differential considerations include infectious or inflammatory etiologies or pulmonary edema. Given history of Sjogren's syndrome, interstitial lung disease is also considered. No remote exams available for comparison. Electronically Signed   By: Keith Rake M.D.   On: 02/12/2018 21:19   Dg Chest Port 1 View  Result Date: 02/10/2018 CLINICAL DATA:  Shortness of breath, respiratory failure EXAM: PORTABLE CHEST 1 VIEW COMPARISON:  02/08/2018 FINDINGS: Diffuse interstitial and alveolar opacities throughout the lungs. Heart is borderline in size. No visible  effusions or acute bony abnormality. Minimal change since prior study. IMPRESSION: Continued bilateral diffuse pulmonary opacities which could reflect edema or pneumonia. Electronically Signed   By: Rolm Baptise M.D.   On: 02/10/2018 09:25   Dg Abd Portable 1v  Result Date: 02/18/2018 CLINICAL DATA:  Feeding tube placement. EXAM: PORTABLE ABDOMEN - 1 VIEW COMPARISON:  Radiograph of February 16, 2018. FINDINGS: The bowel gas pattern is normal. Distal tip of feeding tube appears to be in expected position of fourth portion of duodenum near the ligament of Treitz. No radio-opaque calculi or other significant radiographic abnormality are seen. IMPRESSION: Distal tip of feeding tube has been advanced into expected position of fourth portion of duodenum near ligament of Treitz. Electronically Signed   By: Marijo Conception, M.D.   On: 02/18/2018 10:56   Dg Abd Portable 1v  Result Date: 02/16/2018 CLINICAL DATA:  Check feeding catheter placement EXAM: PORTABLE ABDOMEN - 1 VIEW COMPARISON:  02/15/2018 FINDINGS: Nasogastric catheter has been removed. A feeding catheter is now seen and appears to extend beyond the pylorus into the first portion of the duodenum. Persistent cecal prominence is noted. No other focal abnormality is seen. IMPRESSION: Feeding catheter which appears to lie within the first portion of the duodenum. Electronically Signed   By: Inez Catalina M.D.   On: 02/16/2018 16:48   Dg Swallowing Func-speech Pathology  Result Date: 02/23/2018 Objective Swallowing Evaluation: Type of Study: MBS-Modified Barium Swallow Study  Patient Details Name: Parks  Needham Biggins MRN: [id number] Date of Birth: [birth year] Today's Date: 02/23/2018 Time: SLP Start Time (ACUTE ONLY): 1033 -SLP Stop Time (ACUTE ONLY): 1042 SLP Time Calculation (min) (ACUTE ONLY): 9 min Past Medical History: Past Medical History: Diagnosis Date . Bronchiectasis ([place])  . CAD (coronary artery disease)  . COPD (chronic obstructive pulmonary  disease) ([place])  . Depression with anxiety  . GERD (gastroesophageal reflux disease)  . Hypothyroidism  . Myasthenia gravis ([place])  . PE (pulmonary thromboembolism) ([place])  . Sjogren's disease ([place])  . Tobacco abuse  Past Surgical History: Past Surgical History: Procedure Laterality Date . Right ulnar nerve impingement   HPI: 67 year old male with PMH as below, which is significant for Myasthenia gravis on imuran, COPD, PE on Xarelto, and CAD. He presented to ED with worsening shortness of breath and workup revealed pneumonia. Pt required multiple intubations across 9 days with one self-extubation.  Subjective: 67 year old male with PMH as below, which is significant for Myasthenia gravis on imuran, COPD, PE on Xarelto, and CAD. He presented to ED with worsening shortness of breath and workup revealed pneumonia. Pt required multiple intubations across 9 days with one self-extubation. Assessment / Plan / Recommendation CHL IP CLINICAL IMPRESSIONS 02/23/2018 Clinical Impression Pt presents with functional oropharyngeal swallowing.  There was trace, transient laryngeal penetration during the swallow with serial straw sips of thin liquid, which was spontaneously cleared.  Pt exhibited prompt and efficient mastication and oral transit of solids.  With pill simulation there was no stasis of tablet or increase penetration.  Tablet fully cleared on esophageal sweep.  Recommend regular texture diet with thin liquid. SLP Visit Diagnosis Dysphagia, unspecified (R13.10) Attention and concentration deficit following -- Frontal lobe and executive function deficit following -- Impact on safety and function No limitations   CHL IP TREATMENT RECOMMENDATION 02/23/2018 Treatment Recommendations Therapy as outlined in treatment plan below   Prognosis 02/23/2018 Prognosis for Safe Diet Advancement Good Barriers to Reach Goals -- Barriers/Prognosis Comment -- CHL IP DIET RECOMMENDATION 02/23/2018 SLP Diet Recommendations Regular solids;Thin  liquid Liquid Administration via Cup;Straw Medication Administration Whole meds with liquid Compensations Minimize environmental distractions;Slow rate;Small sips/bites Postural Changes Seated upright at 90 degrees   CHL IP OTHER RECOMMENDATIONS 02/23/2018 Recommended Consults -- Oral Care Recommendations Oral care BID Other Recommendations --   No flowsheet data found.  CHL IP FREQUENCY AND DURATION 02/23/2018 Speech Therapy Frequency (ACUTE ONLY) min 1 x/week Treatment Duration 1 week      CHL IP ORAL PHASE 02/23/2018 Oral Phase WFL Oral - Pudding Teaspoon -- Oral - Pudding Cup -- Oral - Honey Teaspoon -- Oral - Honey Cup -- Oral - Nectar Teaspoon -- Oral - Nectar Cup -- Oral - Nectar Straw -- Oral - Thin Teaspoon -- Oral - Thin Cup -- Oral - Thin Straw -- Oral - Puree -- Oral - Mech Soft -- Oral - Regular -- Oral - Multi-Consistency -- Oral - Pill -- Oral Phase - Comment --  CHL IP PHARYNGEAL PHASE 02/23/2018 Pharyngeal Phase Impaired Pharyngeal- Pudding Teaspoon -- Pharyngeal -- Pharyngeal- Pudding Cup -- Pharyngeal -- Pharyngeal- Honey Teaspoon -- Pharyngeal -- Pharyngeal- Honey Cup -- Pharyngeal -- Pharyngeal- Nectar Teaspoon -- Pharyngeal -- Pharyngeal- Nectar Cup -- Pharyngeal -- Pharyngeal- Nectar Straw -- Pharyngeal -- Pharyngeal- Thin Teaspoon -- Pharyngeal -- Pharyngeal- Thin Cup [facility] Pharyngeal Material does not enter airway Pharyngeal- Thin Straw Delayed swallow initiation-vallecula;Penetration/Aspiration during swallow Pharyngeal Material enters airway, remains ABOVE vocal cords then ejected out Pharyngeal- Puree -- Pharyngeal --  Pharyngeal- Mechanical Soft -- Pharyngeal -- Pharyngeal- Regular -- Pharyngeal -- Pharyngeal- Multi-consistency -- Pharyngeal -- Pharyngeal- Pill -- Pharyngeal -- Pharyngeal Comment --  CHL IP CERVICAL ESOPHAGEAL PHASE 02/23/2018 Cervical Esophageal Phase WFL Pudding Teaspoon -- Pudding Cup -- Honey Teaspoon -- Honey Cup -- Nectar Teaspoon -- Nectar Cup -- Nectar Straw -- Thin  Teaspoon -- Thin Cup -- Thin Straw -- Puree -- Mechanical Soft -- Regular -- Multi-consistency -- Pill -- Cervical Esophageal Comment -- Celedonio Savage, MA, [place] Office: [phone] 02/23/2018, 11:27 AM                LOS: 20 days   Signature  Lala Lund M.D on 02/28/2018 at 12:02 PM  To page go to [email or web address] - password [facility]

## 2018-02-28 NOTE — Consult Note (Signed)
Physical Medicine and Rehabilitation Consult Reason for Consult:debility Referring Physician: Thedore Mins   HPI: Curtis Preston is a 67 y.o. male with hx of MG, COPD, visiting from [place], who developed worsening SOB with cough which led to his admission on 02/06/18. Pt's status worsened and he required intubation on 02/13/18. He ultimately was extubated and transferred to the floor. Course complicated by ICU psychosis and severe debility. PM&R was consulted for rehab options.    Review of Systems  Constitutional: Negative for fever.  HENT: Negative for hearing loss.   Eyes: Negative for double vision.  Respiratory: Positive for cough and shortness of breath.   Cardiovascular: Negative for palpitations.  Genitourinary: Negative for dysuria.  Musculoskeletal: Negative for myalgias.  Skin: Negative for itching.  Neurological: Positive for dizziness.  Psychiatric/Behavioral: The patient does not have insomnia.    Past Medical History:  Diagnosis Date  . Bronchiectasis ([facility])   . CAD (coronary artery disease)   . COPD (chronic obstructive pulmonary disease) ([facility])   . Depression with anxiety   . GERD (gastroesophageal reflux disease)   . Hypothyroidism   . Myasthenia gravis ([facility])   . PE (pulmonary thromboembolism) ([facility])   . Sjogren's disease ([facility])   . Tobacco abuse    Past Surgical History:  Procedure Laterality Date  . Right ulnar nerve impingement     Family History  Problem Relation Age of Onset  . Heart attack Father    Social History:  reports that he has been smoking cigarettes. He has been smoking about 0.50 packs per day. He has never used smokeless tobacco. He reports that he drinks alcohol. He reports that he does not use drugs. Allergies:  Allergies  Allergen Reactions  . Hydromorphone Other (See Comments)    hallucinations   . Immune Globulin Other (See Comments)     Bilateral pulmonary embolism     Medications Prior to Admission  Medication Sig  Dispense Refill  . aspirin (ASPIRIN 81) 81 MG EC tablet Take 81 mg by mouth daily.    Marland Kitchen atorvastatin (LIPITOR) 40 MG tablet Take 40 mg by mouth at bedtime.    Marland Kitchen azathioprine (IMURAN) 100 MG tablet Take 200 mg by mouth at bedtime.    Marland Kitchen azelastine (ASTELIN) 0.1 % nasal spray Place 2 sprays into the nose 2 (two) times daily.    . baclofen (LIORESAL) 10 MG tablet Take 10 mg by mouth 3 (three) times daily.  3  . benzonatate (TESSALON) 200 MG capsule Take 1 capsule by mouth as needed for cough.    Marland Kitchen buPROPion (WELLBUTRIN SR) 150 MG 12 hr tablet Take 150 mg by mouth 2 (two) times daily.    . Calcium Carb-Ergocalciferol 500-200 MG-UNIT TABS Take 1 tablet by mouth daily.    . fluticasone (FLONASE) 50 MCG/ACT nasal spray Place 1 spray into the nose 2 (two) times daily.    Marland Kitchen guaiFENesin-dextromethorphan (ROBITUSSIN DM) 100-10 MG/5ML syrup Take 10 mLs by mouth 2 (two) times daily as needed for cough.     Marland Kitchen HYDROcodone-acetaminophen (NORCO/VICODIN) 5-325 MG tablet Take 1 tablet by mouth 3 (three) times daily as needed.  0  . ipratropium (ATROVENT) 0.03 % nasal spray Place 1 spray into both nostrils every 12 (twelve) hours.    Marland Kitchen ipratropium-albuterol (DUONEB) 0.5-2.5 (3) MG/3ML SOLN Inhale 3 mLs into the lungs every 6 (six) hours.    Marland Kitchen levothyroxine (SYNTHROID, LEVOTHROID) 175 MCG tablet Take 175 mcg by mouth daily before breakfast.    .  mirtazapine (REMERON) 15 MG tablet Take 30 mg by mouth at bedtime.    . montelukast (SINGULAIR) 10 MG tablet Take 10 mg by mouth at bedtime.    Marland Kitchen morphine (MS CONTIN) 15 MG 12 hr tablet Take 15 mg by mouth at bedtime.  0  . Multiple Vitamins-Minerals (ICAPS AREDS 2 PO) Take 1 tablet by mouth 2 (two) times daily.    Marland Kitchen omeprazole (PRILOSEC) 40 MG capsule Take 40 mg by mouth 2 (two) times daily.    . ondansetron (ZOFRAN) 4 MG tablet Take 4 mg by mouth every 6 (six) hours as needed for nausea or vomiting.     . pregabalin (LYRICA) 50 MG capsule Take 50 mg by mouth at bedtime.      . pyridostigmine (MESTINON) 60 MG tablet Take 60 mg by mouth 4 (four) times daily.     . rivaroxaban (XARELTO) 20 MG TABS tablet Take 20 mg by mouth daily.    . Tiotropium Bromide-Olodaterol (STIOLTO RESPIMAT) 2.5-2.5 MCG/ACT AERS Inhale 2 puffs into the lungs daily.    Marland Kitchen venlafaxine (EFFEXOR) 75 MG tablet Take 225 mg by mouth daily.    . vitamin B-12 (CYANOCOBALAMIN) 1000 MCG tablet Take 1,000 mcg by mouth daily.      Home: Home Living Family/patient expects to be discharged to:: Private residence Living Arrangements: Spouse/significant other, Non-relatives/Friends Available Help at Discharge: Family Type of Home: House Home Access: Stairs to enter Secretary/administrator of Steps: 2 Entrance Stairs-Rails: None Home Layout: Two level, Able to live on main level with bedroom/bathroom, Laundry or work area in basement Alternate Teacher, music of Steps: flight Bathroom Shower/Tub: Engineer, manufacturing systems: Standard Home Equipment: Environmental consultant - 2 wheels, [employer] - single point Additional Comments: fiancee, brother, son, grandkids all live with pt; pt from [place]  Functional History: Prior Function Level of Independence: Independent Comments: no O2 prior, retired, works in the yard, driving Functional Status:  Mobility: Bed Mobility Overal bed mobility: Needs Assistance Bed Mobility: Supine to Sit Supine to sit: Min guard Sit to supine: Min assist General bed mobility comments: minguard for safety, no physical assist required, pt requires rest break after completion of bed mobility Transfers Overall transfer level: Needs assistance Equipment used: Rolling walker (2 wheeled) Transfers: Sit to/from Stand, [employer] Transfers Sit to Stand: Mod assist Stand pivot transfers: Min assist General transfer comment: modA to boost into standing from EOB, VCs for safe hand placement; pt able to take pivotal steps to recliner with min steading assist throughout Ambulation/Gait General  Gait Details: unable to attempt    ADL: ADL Overall ADL's : Needs assistance/impaired Eating/Feeding: Modified independent, Sitting Grooming: Set up, Min guard, Sitting Upper Body Bathing: Min guard, Sitting Lower Body Bathing: Moderate assistance, Sit to/from stand Upper Body Dressing : Min guard, Sitting Lower Body Dressing: Moderate assistance, Sit to/from stand Toilet Transfer: Minimal assistance, Stand-pivot, BSC, RW Toilet Transfer Details (indicate cue type and reason): simulated in transfer EOB to recliner Toileting- Clothing Manipulation and Hygiene: Moderate assistance, Sit to/from stand Functional mobility during ADLs: Minimal assistance, Moderate assistance, Rolling walker(modA sit<>stand, minA for stand pivot) General ADL Comments: pt severely deconditioned; on 5L O2 during session, lowest sat noted at 84% after transfer to recliner, taking approx 1 min to rebound to 90% and above; requires education and visual/verbal cues for completing pursed lip breathing  Cognition: Cognition Overall Cognitive Status: Impaired/Different from baseline Orientation Level: Oriented to person, Oriented to place, Oriented to time Cognition Arousal/Alertness: Awake/alert Behavior During Therapy: Anxious  Overall Cognitive Status: Impaired/Different from baseline Area of Impairment: Attention, Following commands, Awareness, Memory Current Attention Level: Selective Memory: Decreased short-term memory Following Commands: Follows multi-step commands inconsistently Safety/Judgement: Decreased awareness of safety, Decreased awareness of deficits Awareness: Emergent Problem Solving: Requires verbal cues, Requires tactile cues General Comments: pt appears to have increased awareness this session of need for assist and of current deficits; continues to require increased time and intermittent cues for following commands; pt also with some memory deficits, when telling therapist story pt noted to lose  train of thought, forgetting name of friend he is referring to in story  Blood pressure (!) 105/58, pulse 70, temperature 97.7 F (36.5 C), temperature source Oral, resp. rate 17, height 6\' 1"  (1.854 m), weight 68.6 kg, SpO2 97 %. Physical Exam  Constitutional: He is oriented to person, place, and time. No distress.  HENT:  Head: Normocephalic.  Eyes: Pupils are equal, round, and reactive to light.  Neck: Normal range of motion.  Cardiovascular: Normal rate.  Respiratory:  Dyspneic with a few steps and somewhat during conversation  GI: Soft.  Musculoskeletal: Normal range of motion.  Neurological: He is alert and oriented to person, place, and time. No cranial nerve deficit.  Psychiatric: He has a normal mood and affect. His behavior is normal.    Results for orders placed or performed during the hospital encounter of 02/06/18 (from the past 24 hour(s))  CBC     Status: Abnormal   Collection Time: 02/28/18  7:57 AM  Result Value Ref Range   WBC 14.4 (H) 4.0 - 10.5 K/uL   RBC 3.63 (L) 4.22 - 5.81 MIL/uL   Hemoglobin 11.4 (L) 13.0 - 17.0 g/dL   HCT 16.1 (L) 09.6 - 04.5 %   MCV 98.3 78.0 - 100.0 fL   MCH 31.4 26.0 - 34.0 pg   MCHC 31.9 30.0 - 36.0 g/dL   RDW 40.9 81.1 - 91.4 %   Platelets 545 (H) 150 - 400 K/uL  Basic metabolic panel     Status: Abnormal   Collection Time: 02/28/18  7:57 AM  Result Value Ref Range   Sodium 139 135 - 145 mmol/L   Potassium 3.4 (L) 3.5 - 5.1 mmol/L   Chloride 104 98 - 111 mmol/L   CO2 26 22 - 32 mmol/L   Glucose, Bld 87 70 - 99 mg/dL   BUN 17 8 - 23 mg/dL   Creatinine, Ser 7.82 0.61 - 1.24 mg/dL   Calcium 8.0 (L) 8.9 - 10.3 mg/dL   GFR calc non Af Amer >60 >60 mL/min   GFR calc Af Amer >60 >60 mL/min   Anion gap 9 5 - 15   No results found.  Assessment/Plan: Diagnosis: 67 yo male with myasthenia gravis and COPD with severe debility after pneumonia and respiratory failure 1. Does the need for close, 24 hr/day medical supervision in  concert with the patient's rehab needs make it unreasonable for this patient to be served in a less intensive setting? Yes 2. Co-Morbidities requiring supervision/potential complications: CAD, depression 3. Due to bladder management, bowel management, safety, skin/wound care, disease management, medication administration, pain management and patient education, does the patient require 24 hr/day rehab nursing? Yes 4. Does the patient require coordinated care of a physician, rehab nurse, PT (1-2 hrs/day, 5 days/week) and OT (1-2 hrs/day, 5 days/week) to address physical and functional deficits in the context of the above medical diagnosis(es)? Yes Addressing deficits in the following areas: balance, endurance, locomotion, strength, transferring, bowel/bladder  control, bathing, dressing, feeding, grooming, toileting and psychosocial support 5. Can the patient actively participate in an intensive therapy program of at least 3 hrs of therapy per day at least 5 days per week? Yes 6. The potential for patient to make measurable gains while on inpatient rehab is excellent 7. Anticipated functional outcomes upon discharge from inpatient rehab are modified independent  with PT, modified independent and supervision with OT, n/a with SLP. 8. Estimated rehab length of stay to reach the above functional goals is: 7-10 days 9. Anticipated D/C setting: Home 10. Anticipated post D/C treatments: HH therapy 11. Overall Rehab/Functional Prognosis: excellent  RECOMMENDATIONS: This patient's condition is appropriate for continued rehabilitative care in the following setting: CIR Patient has agreed to participate in recommended program. Yes Note that insurance prior authorization may be required for reimbursement for recommended care.  Comment: Rehab Admissions Coordinator to follow up.  Thanks,  Ranelle Oyster, MD, [place]  I have personally performed a face to face diagnostic evaluation of this patient.  Additionally, I have reviewed and concur with the physician assistant's documentation above.    Ranelle Oyster, MD 02/28/2018

## 2018-03-01 ENCOUNTER — Inpatient Hospital Stay (HOSPITAL_COMMUNITY): Payer: Medicare HMO

## 2018-03-01 DIAGNOSIS — I5030 Unspecified diastolic (congestive) heart failure: Secondary | ICD-10-CM

## 2018-03-01 LAB — BASIC METABOLIC PANEL
ANION GAP: 7 (ref 5–15)
BUN: 30 mg/dL — ABNORMAL HIGH (ref 8–23)
CO2: 27 mmol/L (ref 22–32)
Calcium: 7.7 mg/dL — ABNORMAL LOW (ref 8.9–10.3)
Chloride: 106 mmol/L (ref 98–111)
Creatinine, Ser: 0.81 mg/dL (ref 0.61–1.24)
GFR calc non Af Amer: >60 mL/min (ref >60)
Glucose, Bld: 98 mg/dL (ref 70–99)
Potassium: 4 mmol/L (ref 3.5–5.1)
SODIUM: 140 mmol/L (ref 135–145)

## 2018-03-01 LAB — ECHOCARDIOGRAM COMPLETE
Height: 73 in
Weight: 2462.1 oz

## 2018-03-01 LAB — MAGNESIUM: Magnesium: 1.8 mg/dL (ref 1.7–2.4)

## 2018-03-01 MED ORDER — FUROSEMIDE 10 MG/ML IJ SOLN
40.0000 mg | Freq: Once | INTRAMUSCULAR | Status: AC
  Administered 2018-03-01: 40 mg via INTRAVENOUS
  Filled 2018-03-01: qty 4

## 2018-03-01 MED ORDER — BENZONATATE 100 MG PO CAPS
200.0000 mg | ORAL_CAPSULE | Freq: Two times a day (BID) | ORAL | Status: DC | PRN
  Administered 2018-03-01: 200 mg via ORAL
  Filled 2018-03-01: qty 2

## 2018-03-01 MED ORDER — MAGNESIUM SULFATE IN D5W 1-5 GM/100ML-% IV SOLN
1.0000 g | Freq: Once | INTRAVENOUS | Status: AC
  Administered 2018-03-01: 1 g via INTRAVENOUS
  Filled 2018-03-01: qty 100

## 2018-03-01 NOTE — Progress Notes (Signed)
  Echocardiogram 2D Echocardiogram has been performed.  Curtis Preston 03/01/2018, 2:22 PM

## 2018-03-01 NOTE — Progress Notes (Signed)
Patient complaining of nonproductive cough and stated unable to sleep as result of that. Paged  Bodenheimer NP via amion and received a new order for Benzonate 200 mg oral  twice daily as needed for cough. Will administer and continue to monitor.

## 2018-03-01 NOTE — Progress Notes (Signed)
PROGRESS NOTE        PATIENT DETAILS Name: Shahid Flori Age: 67 y.o. Sex: male Date of Birth: 1951-05-06 Admit Date: 02/06/2018 Admitting Physician Ivor Costa, MD PCP:No primary care provider on file.  Brief Narrative: Patient is a 67 y.o. male with history of myasthenia gravis on Imuran, PE on Xarelto, COPD not on home O2 who originally is from [place] and is here in town for his brother-in-law's funeral, he developed worsening shortness of breath and cough (started since August but worsened acutely)-he subsequently presented to the ED and was found to have pneumonia and started on empiric antibiotics.  Ultimately patient worsened and required intubation on 9/13.  While in the ICU, patient underwent bronchoscopy with negative BAL.  Upon clinical stability he was extubated and transferred to the trial hospitalist service on 9/26.  Hospital course has been complicated by ICU delirium, severe deconditioning and ongoing hypoxia but gradually improving.  See below for further details  Significant events: 9/13 - intubated 9/13 - bronch with BAL 9/19 - repeated bronch with BAL 9/22 - extubated, remained in the ICU due to delirium   Subjective:  Patient in bed, appears comfortable, denies any headache, no fever, no chest pain or pressure, no shortness of breath , no abdominal pain. No focal weakness.   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  Clinically much improved down to 2-3 L nasal cannula oxygen now.  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  Slowly taper off steroids.  Leukocytosis: Likely secondary to steroids-slowly improving-completed a course of antimicrobial therapy.  Follow.  Myasthenia gravis: Seen by neurology-not felt to have myasthenic crisis-Imuran remains on hold.  Continue Mestinon.  COPD: Not Wheezing-moving air well-continue bronchodilators  Hypothyroidism: Continue Synthroid  History of pulmonary embolism: Continue rivaroxaban.  ICU delirium: Resolved  Severe deconditioning: Continue PT, may require SNF versus CIR.  Eager to be discharged home to [place].  Plan is to drive back which is clearly not safe.  Patient counseled.    DVT Prophylaxis: Full dose anticoagulation with Xarelto  Code Status: Full code   Family Communication: None at bedside  Disposition Plan: Likely discharge in the next 1 to 2 days to SNF if week or home if physically able to  Antimicrobial agents: Anti-infectives (From admission, onward)   Start     Dose/Rate Route Frequency Ordered Stop   02/14/18 1400  imipenem-cilastatin (PRIMAXIN) 750 mg in sodium chloride 0.9 % 100 mL IVPB     750 mg 200 mL/hr over 30 Minutes Intravenous Every 8 hours 02/14/18 1031 02/17/18 2322   02/13/18 1330  sulfamethoxazole-trimethoprim (BACTRIM) 370 mg in dextrose 5 % 500 mL IVPB     370 mg 348.8 mL/hr over 90 Minutes Intravenous Every 8 hours 02/13/18 1238 02/17/18 2332   02/09/18  1600  ciprofloxacin (CIPRO) IVPB 400 mg  Status:  Discontinued     400 mg 200 mL/hr over 60 Minutes Intravenous Every 12 hours 02/09/18 1516 02/13/18 1140   02/09/18 1200  ciprofloxacin (CIPRO) tablet 500 mg  Status:  Discontinued     500 mg Oral 2 times daily 02/09/18 1159 02/09/18 1211   02/08/18 0100  vancomycin (VANCOCIN) IVPB 1000 mg/200 mL  premix  Status:  Discontinued     1,000 mg 200 mL/hr over 60 Minutes Intravenous Every 24 hours 02/07/18 0107 02/09/18 1159   02/08/18 0000  azithromycin (ZITHROMAX) 250 mg in dextrose 5 % 125 mL IVPB  Status:  Discontinued     250 mg 125 mL/hr over 60 Minutes Intravenous Every 24 hours 02/07/18 0032 02/09/18 1159   02/07/18 0600  ceFEPIme (MAXIPIME) 1 g in sodium chloride 0.9 % 100 mL IVPB  Status:  Discontinued     1 g 200 mL/hr over 30 Minutes Intravenous Every 8 hours 02/07/18 0032 02/09/18 1159   02/07/18 0045  ceFEPIme (MAXIPIME) 1 g in sodium chloride 0.9 % 100 mL IVPB  Status:  Discontinued     1 g 200 mL/hr over 30 Minutes Intravenous Every 8 hours 02/07/18 0031 02/07/18 0032   02/07/18 0045  vancomycin (VANCOCIN) 1,500 mg in sodium chloride 0.9 % 500 mL IVPB     1,500 mg 250 mL/hr over 120 Minutes Intravenous  Once 02/07/18 0042 02/07/18 0559   02/06/18 2330  cefTRIAXone (ROCEPHIN) 1 g in sodium chloride 0.9 % 100 mL IVPB     1 g 200 mL/hr over 30 Minutes Intravenous  Once 02/06/18 2326 02/07/18 0048   02/06/18 2330  azithromycin (ZITHROMAX) 500 mg in sodium chloride 0.9 % 250 mL IVPB     500 mg 250 mL/hr over 60 Minutes Intravenous  Once 02/06/18 2326 02/07/18 0201      Procedures: 9/13 -Intubated for hypoxia.  9/13 Bronch BAL 9/19 Repeat bronch, BAL  CONSULTS:  pulmonary/intensive care, neurology and CTVS  Time spent: 25- minutes-Greater than 50% of this time was spent in counseling, explanation of diagnosis, planning of further management, and coordination of care.  MEDICATIONS: Scheduled Meds: . arformoterol  15 mcg Nebulization BID  . aspirin  81 mg Oral Daily  . atorvastatin  40 mg Oral QHS  . baclofen  10 mg Oral TID  . calcium-vitamin D  1 tablet Oral Daily  . Chlorhexidine Gluconate Cloth  6 each Topical Daily  . feeding supplement (ENSURE ENLIVE)  237 mL Oral TID BM  . levothyroxine  175 mcg Oral QAC breakfast  . mouth rinse  15 mL Mouth Rinse BID  .  mirtazapine  15 mg Oral QHS  . montelukast  10 mg Oral QHS  . pantoprazole  40 mg Oral Q1200  . predniSONE  60 mg Oral Q breakfast  . pregabalin  50 mg Oral QHS  . pyridostigmine  30 mg Oral QID  . rivaroxaban  20 mg Oral Q supper  . senna-docusate  1 tablet Oral BID  . tiotropium  18 mcg Inhalation Daily  . vitamin B-12  1,000 mcg Oral Daily   Continuous Infusions: . sodium chloride 10 mL/hr at 02/23/18 0600   PRN Meds:.sodium chloride, acetaminophen (TYLENOL) oral liquid 160 mg/5 mL, albuterol, benzonatate, polyethylene glycol   PHYSICAL EXAM: Vital signs: Vitals:   02/28/18 2053 02/28/18 2114 03/01/18 0456 03/01/18 0600  BP: 95/62  106/64   Pulse: 94 82 78   Resp: 18 16 18  Temp: 98.2 F (36.8 C)  98.6 F (37 C)   TempSrc:   Oral   SpO2: 92% 93% 96%   Weight:    69.8 kg  Height:       Filed Weights   02/27/18 0526 02/28/18 0500 03/01/18 0600  Weight: 71.6 kg 68.6 kg 69.8 kg   Body mass index is 20.3 kg/m.   Exam  Frail elderly white male, in no distress, Awake Alert, Oriented X 3, No new F.N deficits, Normal affect [place].AT,PERRAL Supple Neck,No JVD, No cervical lymphadenopathy appriciated.  Symmetrical Chest wall movement, Good air movement bilaterally, fine rales RRR,No Gallops, Rubs or new Murmurs, No Parasternal Heave +ve B.Sounds, Abd Soft, No tenderness, No organomegaly appriciated, No rebound - guarding or rigidity. No Cyanosis, Clubbing or edema, No new Rash or bruise   I have personally reviewed following labs and imaging studies  LABORATORY DATA: CBC: Recent Labs  Lab 02/24/18 0420 02/25/18 0734 02/26/18 0528 02/27/18 0331 02/28/18 0757  WBC 10.5 12.9* 16.0* 15.9* 14.4*  NEUTROABS  --  8.4*  --  10.7*  --   HGB 9.8* 10.3* 11.1* 10.9* 11.4*  HCT 30.5* 31.8* 34.1* 34.1* 35.7*  MCV 98.4 97.8 98.0 97.2 98.3  PLT 560* 562* 578* 560* 545*    Basic Metabolic Panel: Recent Labs  Lab 02/24/18 0420 02/25/18 0734 02/26/18 0528 02/27/18 0331  02/28/18 0757 03/01/18 0426  NA 143 140 140 139 139 140  K 3.2* 3.6 3.4* 3.6 3.4* 4.0  CL 105 104 111 103 104 106  CO2 _0 GLUCOSE 97 88 85 99 87 98  BUN 27* _1 30*  CREATININE 0.68 0.72 0.64 0.73 0.70 0.81  CALCIUM 8.1* 7.7* 7.2* 8.1* 8.0* 7.7*  MG 1.9 1.9 1.7 1.9  --  1.8  PHOS 3.0 3.3 2.9  --   --   --     GFR: Estimated Creatinine Clearance: 85 mL/min (by C-G formula based on SCr of 0.81 mg/dL).  Liver Function Tests: No results for input(s): AST, ALT, ALKPHOS, BILITOT, PROT, ALBUMIN in the last 168 hours. No results for input(s): LIPASE, AMYLASE in the last 168 hours. No results for input(s): AMMONIA in the last 168 hours.  Coagulation Profile: No results for input(s): INR, PROTIME in the last 168 hours.  Cardiac Enzymes: No results for input(s): CKTOTAL, CKMB, CKMBINDEX, TROPONINI in the last 168 hours.  BNP (last 3 results) No results for input(s): PROBNP in the last 8760 hours.  HbA1C: No results for input(s): HGBA1C in the last 72 hours.  CBG: Recent Labs  Lab 02/25/18 0445 02/25/18 0803 02/25/18 1103 02/25/18 1511 02/25/18 1926  GLUCAP 81 71 83 142* 97    Lipid Profile: No results for input(s): CHOL, HDL, LDLCALC, TRIG, CHOLHDL, LDLDIRECT in the last 72 hours.  Thyroid Function Tests: No results for input(s): TSH, T4TOTAL, FREET4, T3FREE, THYROIDAB in the last 72 hours.  Anemia Panel: No results for input(s): VITAMINB12, FOLATE, FERRITIN, TIBC, IRON, RETICCTPCT in the last 72 hours.  Urine analysis:    Component Value Date/Time   COLORURINE YELLOW 02/20/2018 0508   APPEARANCEUR CLEAR 02/20/2018 0508   LABSPEC 1.021 02/20/2018 0508   PHURINE 5.0 02/20/2018 0508   GLUCOSEU NEGATIVE 02/20/2018 0508   HGBUR MODERATE (A) 02/20/2018 0508   BILIRUBINUR NEGATIVE 02/20/2018 0508   KETONESUR NEGATIVE 02/20/2018 0508   PROTEINUR NEGATIVE 02/20/2018 0508   NITRITE NEGATIVE 02/20/2018 0508   LEUKOCYTESUR NEGATIVE 02/20/2018 9735  Sepsis Labs: Lactic Acid, Venous    Component Value Date/Time   LATICACIDVEN 0.6 02/20/2018 0619    MICROBIOLOGY: Recent Results (from the past 240 hour(s))  Gram stain     Status: None   Collection Time: 02/19/18 10:31 AM  Result Value Ref Range Status   Specimen Description BRONCHIAL ALVEOLAR LAVAGE  Final   Special Requests NONE  Final   Gram Stain   Final    ABUNDANT WBC PRESENT, PREDOMINANTLY PMN NO ORGANISMS SEEN Gram Stain Report Called to,Read Back By and Verified With: J POOLE,RN AT 1148 02/20/18 BY L BENFIELD Performed at [facility], [place] [address] [place], [place] [address]    Report Status 02/20/2018 FINAL  Final  Culture, blood (Routine X 2) w Reflex to ID Panel     Status: None   Collection Time: 02/20/18  3:50 AM  Result Value Ref Range Status   Specimen Description BLOOD LEFT ANTECUBITAL  Final   Special Requests   Final    BOTTLES DRAWN AEROBIC ONLY Blood Culture results may not be optimal due to an inadequate volume of blood received in culture bottles   Culture   Final    NO GROWTH 5 DAYS Performed at [facility], [place] [address], [place], [place] [address]    Report Status 02/25/2018 FINAL  Final  Culture, blood (Routine X 2) w Reflex to ID Panel     Status: Abnormal   Collection Time: 02/20/18  3:57 AM  Result Value Ref Range Status   Specimen Description BLOOD RIGHT ANTECUBITAL  Final   Special Requests   Final    BOTTLES DRAWN AEROBIC ONLY Blood Culture adequate volume   Culture  Setup Time   Final    GRAM POSITIVE COCCI AEROBIC BOTTLE ONLY CRITICAL RESULT CALLED TO, READ BACK BY AND VERIFIED WITH: L SEAY PHARMD 0401 02/21/18 A BROWNING    Culture (A)  Final    VIRIDANS STREPTOCOCCUS THE SIGNIFICANCE OF ISOLATING THIS ORGANISM FROM A SINGLE SET OF BLOOD CULTURES WHEN MULTIPLE SETS ARE DRAWN IS UNCERTAIN. PLEASE NOTIFY THE MICROBIOLOGY DEPARTMENT WITHIN ONE WEEK IF SPECIATION AND SENSITIVITIES ARE REQUIRED. Performed at [facility], [place] [address], [place],  [address]    Report Status 02/24/2018 FINAL  Final  Blood Culture ID Panel (Reflexed)     Status: Abnormal   Collection Time: 02/20/18  3:57 AM  Result Value Ref Range Status   Enterococcus species NOT DETECTED NOT DETECTED Final   Listeria monocytogenes NOT DETECTED NOT DETECTED Final   Staphylococcus species NOT DETECTED NOT DETECTED Final   Staphylococcus aureus NOT DETECTED NOT DETECTED Final   Streptococcus species DETECTED (A) NOT DETECTED Final    Comment: Not Enterococcus species, Streptococcus agalactiae, Streptococcus pyogenes, or Streptococcus pneumoniae. CRITICAL RESULT CALLED TO, READ BACK BY AND VERIFIED WITH: L SEAY PHARMD 0401 02/21/18 A BROWNING    Streptococcus agalactiae NOT DETECTED NOT DETECTED Final   Streptococcus pneumoniae NOT DETECTED NOT DETECTED Final   Streptococcus pyogenes NOT DETECTED NOT DETECTED Final   Acinetobacter baumannii NOT DETECTED NOT DETECTED Final   Enterobacteriaceae species NOT DETECTED NOT DETECTED Final   Enterobacter cloacae complex NOT DETECTED NOT DETECTED Final   Escherichia coli NOT DETECTED NOT DETECTED Final   Klebsiella oxytoca NOT DETECTED NOT DETECTED Final   Klebsiella pneumoniae NOT DETECTED NOT DETECTED Final   Proteus species NOT DETECTED NOT DETECTED Final   Serratia marcescens NOT DETECTED NOT DETECTED Final   Haemophilus influenzae  NOT DETECTED NOT DETECTED Final   Neisseria meningitidis NOT DETECTED NOT DETECTED Final   Pseudomonas aeruginosa NOT DETECTED NOT DETECTED Final   Candida albicans NOT DETECTED NOT DETECTED Final   Candida glabrata NOT DETECTED NOT DETECTED Final   Candida krusei NOT DETECTED NOT DETECTED Final   Candida parapsilosis NOT DETECTED NOT DETECTED Final   Candida tropicalis NOT DETECTED NOT DETECTED Final    Comment: Performed at [facility], [place] [address], [place], [place] [address]  Culture, Urine     Status: None   Collection Time: 02/20/18   5:08 AM  Result Value Ref Range Status   Specimen Description URINE, RANDOM  Final   Special Requests NONE  Final   Culture   Final    NO GROWTH Performed at [facility], [place] [address] [place], [place] [address]    Report Status 02/21/2018 FINAL  Final    RADIOLOGY STUDIES/RESULTS: Dg Chest 2 View  Result Date: 02/28/2018 CLINICAL DATA:  Recent pneumonia EXAM: CHEST - 2 VIEW COMPARISON:  02/26/2018 FINDINGS: Normal heart size. Left subclavian Port-A-Cath is stable. Patchy airspace opacities throughout both lungs are stable. No pneumothorax. No pleural effusion. Mid-level thoracic compression deformity is stable compare with a recent CT. IMPRESSION: Stable bilateral airspace opacities. Electronically Signed   By: Marybelle Killings M.D.   On: 02/28/2018 15:13   Dg Chest 2 View  Result Date: 02/08/2018 CLINICAL DATA:  Chest pain and shortness of breath EXAM: CHEST - 2 VIEW COMPARISON:  February 06, 2018 FINDINGS: The left central line is stable. There appears to be a small bulla in the left apex. No pneumothorax. Bilateral pulmonary opacities most prominent in the mid lower lungs. No other interval changes or acute abnormalities. IMPRESSION: 1. Stable left Port-A-Cath. 2. Bilateral pulmonary opacities may represent worsening edema versus worsening bilateral pneumonia. Recommend clinical correlation and follow-up to resolution. Electronically Signed   By: Dorise Bullion III M.D   On: 02/08/2018 07:08   Dg Chest 2 View  Result Date: 02/06/2018 CLINICAL DATA:  Shortness of breath cough and congestion for 3 days. EXAM: CHEST - 2 VIEW COMPARISON:  None. FINDINGS: Left subclavian injectable port terminates at the expected location of the cavoatrial junction. Cardiomediastinal silhouette is normal. Mediastinal contours appear intact. There is no evidence of pleural effusion or pneumothorax. Patchy interstitial and alveolar opacities in bilateral lungs, with particular prominence in the right upper  lobe, right middle lobe and left lower lobe. Possible pulmonary nodule in the right mid lung field. Osseous structures are without acute abnormality. Soft tissues are grossly normal. IMPRESSION: Patchy interstitial and alveolar opacities in bilateral lungs. Findings may represent multifocal pneumonia or developing asymmetric pulmonary edema. Possible pulmonary nodule in the right mid lung field. Electronically Signed   By: Fidela Salisbury M.D.   On: 02/06/2018 21:12   Dg Abd 1 View  Result Date: 02/15/2018 CLINICAL DATA:  Nausea and vomiting EXAM: ABDOMEN - 1 VIEW COMPARISON:  None. FINDINGS: Scattered large and small bowel gas is noted. Cecum is mildly prominent at almost 12 cm. No free air is seen. Nasogastric catheter is noted within the stomach. No bony abnormality is seen. No mass lesion is noted. IMPRESSION: Gas dilated cecum to 12 cm. Nasogastric catheter within the stomach. Electronically Signed   By: Inez Catalina M.D.   On: 02/15/2018 09:46   Ct Chest Wo Contrast  Result Date: 02/18/2018 CLINICAL DATA:  Increased secretions, ventilator dependent. EXAM: CT CHEST WITHOUT CONTRAST TECHNIQUE: Multidetector  CT imaging of the chest was performed following the standard protocol without IV contrast. COMPARISON:  Radiograph of February 16, 2018. CT scan of February 08, 2018. FINDINGS: Cardiovascular: Atherosclerosis of thoracic aorta is noted without aneurysm formation. Coronary artery calcifications are noted. Normal cardiac size. No pericardial effusion. Mediastinum/Nodes: Feeding tube is seen passing through esophagus into stomach. Significantly increased mediastinal adenopathy is noted compared to prior exam, most likely inflammatory in etiology; this includes 14 mm precarinal lymph node. 11 mm right paratracheal lymph node is noted and and increased compared to prior exam. 12 mm left hilar lymph node is noted which is enlarged compared to prior exam. Tracheostomy tube is well positioned.  Lungs/Pleura: No pneumothorax is noted. Emphysematous disease is again noted. Significantly increased lung opacities are noted bilaterally and diffusely most consistent with worsening pneumonia. No significant pleural effusion is noted. Upper Abdomen: No acute abnormality. Musculoskeletal: No chest wall mass or suspicious bone lesions identified. IMPRESSION: Significantly increased airspace opacities are noted throughout both lungs consistent with severely worsening pneumonia. Increased mediastinal adenopathy is also noted most likely is inflammatory in etiology. Coronary artery calcifications are noted. Aortic Atherosclerosis (ICD10-I70.0) and Emphysema (ICD10-J43.9). Electronically Signed   By: Marijo Conception, M.D.   On: 02/18/2018 16:50   Ct Chest Wo Contrast  Result Date: 02/08/2018 CLINICAL DATA:  Progressive hypoxemia.  Pneumonia. EXAM: CT CHEST WITHOUT CONTRAST TECHNIQUE: Multidetector CT imaging of the chest was performed following the standard protocol without IV contrast. COMPARISON:  Chest x-ray 02/08/2018 FINDINGS: Cardiovascular: Heart size is normal. Atherosclerotic calcifications are present coronary arteries. Atherosclerotic changes are present at the aortic arch. Pulmonary arteries mildly prominent bilaterally. No significant pericardial effusion is present. Mediastinum/Nodes: No significant mediastinal, axillary, or hilar adenopathy is present. The esophagus is within normal limits. The thoracic inlet is unremarkable. Lungs/Pleura: Extensive confluent ground-glass attenuation is present throughout the lower lobes, the lingula, and right middle lobe. Consolidation is most evident in the superior segment of the left lower lobe. Extensive centrilobular emphysematous changes are present. No significant pleural effusion is present. Upper Abdomen: Unremarkable. Musculoskeletal: A remote superior endplate fractures present at T7. Vertebral body heights alignment are otherwise maintained. No focal  lytic or blastic lesions are present. Ribs are within normal limits. IMPRESSION: 1. Extensive confluent ground-glass attenuation throughout both lungs with consolidation more prevalent in the superior segment of the left lower lobe. The findings are nonspecific, but most concerning for multi lobar pneumonia. Edema or possibly could have a similar appearance. This is to extensive for atelectasis. Consolidation in the superior segment in the left lower lobe suggests infection. 2.  Aortic Atherosclerosis (ICD10-I70.0). 3.  Emphysema (ICD10-J43.9). 4. Coronary artery disease Electronically Signed   By: San Morelle M.D.   On: 02/08/2018 11:47   Dg Chest Port 1 View  Result Date: 02/26/2018 CLINICAL DATA:  Acute respiratory failure EXAM: PORTABLE CHEST 1 VIEW COMPARISON:  10/25/2017 FINDINGS: Left subclavian Port-A-Cath is stable. The heart is borderline enlarged. Patchy bilateral airspace disease right greater than left is stable. No pneumothorax. No pleural effusion. IMPRESSION: Stable bilateral airspace disease right greater than left. Electronically Signed   By: Marybelle Killings M.D.   On: 02/26/2018 09:26   Dg Chest Port 1 View  Result Date: 02/25/2018 CLINICAL DATA:  Respiratory failure, shortness of breath. EXAM: PORTABLE CHEST 1 VIEW COMPARISON:  Radiograph of February 22, 2018. FINDINGS: Stable cardiomediastinal silhouette. Endotracheal and nasogastric tubes have been removed. Left subclavian Port-A-Cath is unchanged in position. No pneumothorax is noted. Stable bilateral  lung opacities are noted concerning for multifocal pneumonia, most prominently seen in left lung base. Mild left pleural effusion may be present. Bony thorax is unremarkable. IMPRESSION: Endotracheal and nasogastric tubes have been removed. Stable bilateral lung opacities are noted consistent with multifocal pneumonia. Mild left pleural effusion is noted. Electronically Signed   By: Marijo Conception, M.D.   On: 02/25/2018 10:29    Dg Chest Port 1 View  Result Date: 02/22/2018 CLINICAL DATA:  Respiratory distress. Follow-up exam. Interstitial lung disease EXAM: PORTABLE CHEST 1 VIEW COMPARISON:  02/19/2018 and older exams. FINDINGS: Hazy, patchy areas of airspace lung opacity are superimposed on chronic interstitial thickening, without significant change compared to the most recent prior exam. There are no new lung abnormalities. No convincing pleural effusion.  No pneumothorax. Right subclavian central line has been removed. Left subclavian central venous line, endotracheal tube and enteric tube are stable. IMPRESSION: 1. No change in lung aeration since the most recent prior exam. Airspace opacities consistent with multifocal pneumonia are superimposed on chronic interstitial lung disease. 2. Remaining support apparatus is stable and well positioned. Electronically Signed   By: Lajean Manes M.D.   On: 02/22/2018 08:00   Dg Chest Port 1 View  Result Date: 02/19/2018 CLINICAL DATA:  Interstitial lung disease, evaluate endotracheal tube placement EXAM: PORTABLE CHEST 1 VIEW COMPARISON:  CT chest of 02/18/2018 and chest x-ray of 02/16/2017 FINDINGS: The tip of the endotracheal tube is approximately 5.8 cm above the carina. Airspace disease noted recently has improved somewhat in the interval. No pleural effusion is seen. Right central venous line tip overlies the upper SVC and a left central venous line tip overlies the lower SVC. The heart is within upper limits of normal and stable. Feeding tube extends below the hemidiaphragm. IMPRESSION: 1. Tip of endotracheal tube 5.8 cm above the carina. 2. Some improvement in patchy airspace disease bilaterally. Electronically Signed   By: Ivar Drape M.D.   On: 02/19/2018 14:09   Dg Chest Port 1 View  Result Date: 02/16/2018 CLINICAL DATA:  Acute respiratory failure with hypoxia EXAM: PORTABLE CHEST 1 VIEW COMPARISON:  Yesterday FINDINGS: Endotracheal tube tip between the clavicular heads  and carina. An orogastric tube at least reaches the pylorus. Bilateral central line with tip at the SVC. Unchanged asymmetric airspace disease, presumed infection in this immunocompromised patient. IMPRESSION: 1. Stable hardware positioning. The orogastric tube may intubate the duodenum. 2. Unchanged airspace disease. Electronically Signed   By: Monte Fantasia M.D.   On: 02/16/2018 07:12   Dg Chest Port 1 View  Result Date: 02/15/2018 CLINICAL DATA:  Intubation. History of COPD. EXAM: PORTABLE CHEST 1 VIEW COMPARISON:  02/14/2018 FINDINGS: Endotracheal tube with tip measuring 7 cm above the carina. Enteric tube tip is off the field of view but below the left hemidiaphragm. Bilateral central venous catheters with tips over the distal SVC region. No pneumothorax. Heart size is normal. There is patchy airspace disease throughout both lungs, demonstrating progression since previous study. No blunting of costophrenic angles. IMPRESSION: Appliances appear to be in satisfactory position. Diffuse bilateral airspace disease in the lungs, demonstrating progression since previous study. Electronically Signed   By: Lucienne Capers M.D.   On: 02/15/2018 22:25   Dg Chest Port 1 View  Result Date: 02/14/2018 CLINICAL DATA:  History of endotracheal tube EXAM: PORTABLE CHEST 1 VIEW COMPARISON:  Yesterday FINDINGS: Endotracheal tube tip between the clavicular heads and carina. An orogastric tube and side-port reaches the stomach. Gas is seen within  the stomach although decreased from prior. Bilateral subclavian line with tip at the SVC. Diffuse interstitial and airspace opacity, stable. Normal heart size and stable mediastinal contours. No effusion or pneumothorax. IMPRESSION: 1. Unremarkable hardware positioning. 2. Persistent airspace disease in the setting of immunocompromise status post recent BAL. Electronically Signed   By: Monte Fantasia M.D.   On: 02/14/2018 08:12   Dg Chest Port 1 View  Result Date:  02/13/2018 CLINICAL DATA:  History of ETT EXAM: PORTABLE CHEST 1 VIEW COMPARISON:  02/13/2018, 02/12/2018, 02/10/2018, CT chest 02/08/2017 FINDINGS: Endotracheal tube tip is about 4.5 cm superior to the carina. Esophageal tube tip is below the diaphragm but non included. Bilateral central venous catheter tips over the SVC. Stable cardiomediastinal silhouette. Interval worsening of interstitial and ground-glass opacity within the bilateral lungs. No pneumothorax. IMPRESSION: 1. Endotracheal tube tip about 4.5 cm superior to the carina. 2. Slight worsening of diffuse bilateral interstitial and ground-glass edema or infiltrates. Electronically Signed   By: Donavan Foil M.D.   On: 02/13/2018 23:39   Dg Chest Port 1 View  Result Date: 02/13/2018 CLINICAL DATA:  Endotracheal tube placement. EXAM: PORTABLE CHEST 1 VIEW COMPARISON:  02/12/2018 FINDINGS: Endotracheal tube has tip 7.3 cm above the carina. Left subclavian central venous catheter is unchanged. Interval placement of right subclavian central venous catheter with tip over the SVC. Lungs are adequately inflated demonstrate continued bilateral hazy interstitial process right worse than left with slight interval improved aeration. No effusion. No pneumothorax. Cardiomediastinal silhouette and remainder the exam is unchanged. IMPRESSION: Persistent bilateral hazy interstitial process with slight improved aeration. Tubes and lines as described.  No pneumothorax. Electronically Signed   By: Marin Olp M.D.   On: 02/13/2018 15:13   Dg Chest Port 1 View  Result Date: 02/12/2018 CLINICAL DATA:  Hypoxia. EXAM: PORTABLE CHEST 1 VIEW COMPARISON:  Prior exams over the last week with radiographs. Chest CT 02/08/2018. No remote exams available. FINDINGS: Left central line with tip in the SVC. Diffuse interstitial/ground-glass opacities with progression in the right suprahilar lung. Unchanged heart size and mediastinal contours. Again seen apical emphysema. No  pneumothorax or pleural effusion. IMPRESSION: Diffuse interstitial/ground-glass opacities with progression in the right suprahilar lung. Differential considerations include infectious or inflammatory etiologies or pulmonary edema. Given history of Sjogren's syndrome, interstitial lung disease is also considered. No remote exams available for comparison. Electronically Signed   By: Keith Rake M.D.   On: 02/12/2018 21:19   Dg Chest Port 1 View  Result Date: 02/10/2018 CLINICAL DATA:  Shortness of breath, respiratory failure EXAM: PORTABLE CHEST 1 VIEW COMPARISON:  02/08/2018 FINDINGS: Diffuse interstitial and alveolar opacities throughout the lungs. Heart is borderline in size. No visible effusions or acute bony abnormality. Minimal change since prior study. IMPRESSION: Continued bilateral diffuse pulmonary opacities which could reflect edema or pneumonia. Electronically Signed   By: Rolm Baptise M.D.   On: 02/10/2018 09:25   Dg Abd Portable 1v  Result Date: 02/18/2018 CLINICAL DATA:  Feeding tube placement. EXAM: PORTABLE ABDOMEN - 1 VIEW COMPARISON:  Radiograph of February 16, 2018. FINDINGS: The bowel gas pattern is normal. Distal tip of feeding tube appears to be in expected position of fourth portion of duodenum near the ligament of Treitz. No radio-opaque calculi or other significant radiographic abnormality are seen. IMPRESSION: Distal tip of feeding tube has been advanced into expected position of fourth portion of duodenum near ligament of Treitz. Electronically Signed   By: Marijo Conception, M.D.   On:  02/18/2018 10:56   Dg Abd Portable 1v  Result Date: 02/16/2018 CLINICAL DATA:  Check feeding catheter placement EXAM: PORTABLE ABDOMEN - 1 VIEW COMPARISON:  02/15/2018 FINDINGS: Nasogastric catheter has been removed. A feeding catheter is now seen and appears to extend beyond the pylorus into the first portion of the duodenum. Persistent cecal prominence is noted. No other focal abnormality  is seen. IMPRESSION: Feeding catheter which appears to lie within the first portion of the duodenum. Electronically Signed   By: Inez Catalina M.D.   On: 02/16/2018 16:48   Dg Swallowing Func-speech Pathology  Result Date: 02/23/2018 Objective Swallowing Evaluation: Type of Study: MBS-Modified Barium Swallow Study  Patient Details Name: Mirza Fessel MRN: [id number] Date of Birth: Jun 19, 1950 Today's Date: 02/23/2018 Time: SLP Start Time (ACUTE ONLY): 1033 -SLP Stop Time (ACUTE ONLY): 1042 SLP Time Calculation (min) (ACUTE ONLY): 9 min Past Medical History: Past Medical History: Diagnosis Date . Bronchiectasis ([place])  . CAD (coronary artery disease)  . COPD (chronic obstructive pulmonary disease) ([place])  . Depression with anxiety  . GERD (gastroesophageal reflux disease)  . Hypothyroidism  . Myasthenia gravis ([place])  . PE (pulmonary thromboembolism) ([place])  . Sjogren's disease ([place])  . Tobacco abuse  Past Surgical History: Past Surgical History: Procedure Laterality Date . Right ulnar nerve impingement   HPI: 67 year old male with PMH as below, which is significant for Myasthenia gravis on imuran, COPD, PE on Xarelto, and CAD. He presented to ED with worsening shortness of breath and workup revealed pneumonia. Pt required multiple intubations across 9 days with one self-extubation.  Subjective: 67 year old male with PMH as below, which is significant for Myasthenia gravis on imuran, COPD, PE on Xarelto, and CAD. He presented to ED with worsening shortness of breath and workup revealed pneumonia. Pt required multiple intubations across 9 days with one self-extubation. Assessment / Plan / Recommendation CHL IP CLINICAL IMPRESSIONS 02/23/2018 Clinical Impression Pt presents with functional oropharyngeal swallowing.  There was trace, transient laryngeal penetration during the swallow with serial straw sips of thin liquid, which was spontaneously cleared.  Pt exhibited prompt and efficient mastication and oral transit of  solids.  With pill simulation there was no stasis of tablet or increase penetration.  Tablet fully cleared on esophageal sweep.  Recommend regular texture diet with thin liquid. SLP Visit Diagnosis Dysphagia, unspecified (R13.10) Attention and concentration deficit following -- Frontal lobe and executive function deficit following -- Impact on safety and function No limitations   CHL IP TREATMENT RECOMMENDATION 02/23/2018 Treatment Recommendations Therapy as outlined in treatment plan below   Prognosis 02/23/2018 Prognosis for Safe Diet Advancement Good Barriers to Reach Goals -- Barriers/Prognosis Comment -- CHL IP DIET RECOMMENDATION 02/23/2018 SLP Diet Recommendations Regular solids;Thin liquid Liquid Administration via Cup;Straw Medication Administration Whole meds with liquid Compensations Minimize environmental distractions;Slow rate;Small sips/bites Postural Changes Seated upright at 90 degrees   CHL IP OTHER RECOMMENDATIONS 02/23/2018 Recommended Consults -- Oral Care Recommendations Oral care BID Other Recommendations --   No flowsheet data found.  CHL IP FREQUENCY AND DURATION 02/23/2018 Speech Therapy Frequency (ACUTE ONLY) min 1 x/week Treatment Duration 1 week      CHL IP ORAL PHASE 02/23/2018 Oral Phase WFL Oral - Pudding Teaspoon -- Oral - Pudding Cup -- Oral - Honey Teaspoon -- Oral - Honey Cup -- Oral - Nectar Teaspoon -- Oral - Nectar Cup -- Oral - Nectar Straw -- Oral - Thin Teaspoon -- Oral - Thin Cup -- Oral - Thin  Straw -- Oral - Puree -- Oral - Mech Soft -- Oral - Regular -- Oral - Multi-Consistency -- Oral - Pill -- Oral Phase - Comment --  CHL IP PHARYNGEAL PHASE 02/23/2018 Pharyngeal Phase Impaired Pharyngeal- Pudding Teaspoon -- Pharyngeal -- Pharyngeal- Pudding Cup -- Pharyngeal -- Pharyngeal- Honey Teaspoon -- Pharyngeal -- Pharyngeal- Honey Cup -- Pharyngeal -- Pharyngeal- Nectar Teaspoon -- Pharyngeal -- Pharyngeal- Nectar Cup -- Pharyngeal -- Pharyngeal- Nectar Straw -- Pharyngeal --  Pharyngeal- Thin Teaspoon -- Pharyngeal -- Pharyngeal- Thin Cup [facility] Pharyngeal Material does not enter airway Pharyngeal- Thin Straw Delayed swallow initiation-vallecula;Penetration/Aspiration during swallow Pharyngeal Material enters airway, remains ABOVE vocal cords then ejected out Pharyngeal- Puree -- Pharyngeal -- Pharyngeal- Mechanical Soft -- Pharyngeal -- Pharyngeal- Regular -- Pharyngeal -- Pharyngeal- Multi-consistency -- Pharyngeal -- Pharyngeal- Pill -- Pharyngeal -- Pharyngeal Comment --  CHL IP CERVICAL ESOPHAGEAL PHASE 02/23/2018 Cervical Esophageal Phase WFL Pudding Teaspoon -- Pudding Cup -- Honey Teaspoon -- Honey Cup -- Nectar Teaspoon -- Nectar Cup -- Nectar Straw -- Thin Teaspoon -- Thin Cup -- Thin Straw -- Puree -- Mechanical Soft -- Regular -- Multi-consistency -- Pill -- Cervical Esophageal Comment -- Celedonio Savage, MA, [place] Office: [phone] 02/23/2018, 11:27 AM                LOS: 21 days   Signature  Lala Lund M.D on 03/01/2018 at 9:20 AM  To page go to [email or web address] - password [facility]

## 2018-03-02 MED ORDER — FUROSEMIDE 10 MG/ML IJ SOLN
40.0000 mg | Freq: Once | INTRAMUSCULAR | Status: AC
  Administered 2018-03-02: 40 mg via INTRAVENOUS
  Filled 2018-03-02: qty 4

## 2018-03-02 MED ORDER — MAGNESIUM SULFATE IN D5W 1-5 GM/100ML-% IV SOLN
1.0000 g | Freq: Once | INTRAVENOUS | Status: AC
  Administered 2018-03-02: 1 g via INTRAVENOUS
  Filled 2018-03-02: qty 100

## 2018-03-02 MED ORDER — PREDNISONE 20 MG PO TABS
40.0000 mg | ORAL_TABLET | Freq: Every day | ORAL | Status: DC
  Administered 2018-03-03 – 2018-03-05 (×3): 40 mg via ORAL
  Filled 2018-03-02 (×3): qty 2

## 2018-03-02 MED ORDER — POTASSIUM CHLORIDE CRYS ER 20 MEQ PO TBCR
20.0000 meq | EXTENDED_RELEASE_TABLET | Freq: Once | ORAL | Status: AC
  Administered 2018-03-02: 20 meq via ORAL
  Filled 2018-03-02: qty 1

## 2018-03-02 NOTE — Progress Notes (Signed)
Physical Therapy Treatment Patient Details Name: Curtis Preston MRN: [id number] DOB: 02-06-1951 Today's Date: 03/02/2018    History of Present Illness 67 yo admitted with SOB, multifocal PNA and sepsis. PMhx: myasthenia gravis, CoPD, PE, CAD Intubated 9/13 for hypoxia, Extubated 9/23. Remained in ICU until 9/26 for delrium     PT Comments    Patient seen for mobility progression. Pt is very pleasant and eager to participate in therapy. Pt tolerated increased activity this session with rest breaks due to DOE and SpO2 desat to 78% on, up to, 4L O2 via [place] with mobility.  Pt requires min A for gait training with +2 for chair follow. Continue to recommend CIR for further skilled PT services to maximize independence and safety with mobility.   Follow Up Recommendations  CIR     Equipment Recommendations  Other (comment)(TBD at next venue)    Recommendations for Other Services Rehab consult     Precautions / Restrictions Precautions Precautions: Fall Precaution Comments: deconditioned, monitor O2 sats     Mobility  Bed Mobility               General bed mobility comments: pt OOB in chair upon arrival  Transfers Overall transfer level: Needs assistance Equipment used: Rolling walker (2 wheeled) Transfers: Sit to/from Stand Sit to Stand: Min assist;Min guard         General transfer comment: min A for initial stand from recliner and min guard for next 3 trials; cues for safe hand placement   Ambulation/Gait Ambulation/Gait assistance: Min assist;+2 safety/equipment Gait Distance (Feet): (~25-30 ft bouts X4) Assistive device: Rolling walker (2 wheeled) Gait Pattern/deviations: Step-through pattern;Decreased stride length;Narrow base of support     General Gait Details: cues for increased BOS, posture, and breathing technique; pt limited by 3/4 DOE and SpO2 desat   Stairs             Wheelchair Mobility    Modified Rankin (Stroke Patients Only)       Balance Overall balance assessment: Needs assistance Sitting-balance support: Feet supported;No upper extremity supported Sitting balance-Leahy Scale: Fair     Standing balance support: Bilateral upper extremity supported Standing balance-Leahy Scale: Poor                              Cognition Arousal/Alertness: Awake/alert Behavior During Therapy: Anxious;WFL for tasks assessed/performed Overall Cognitive Status: Within Functional Limits for tasks assessed                                        Exercises      General Comments General comments (skin integrity, edema, etc.): pt on 1.5L O2 via [place] upon arrival to room and SpO2 91% at rest; pt with SpO2 desat to 78% while ambulating on up to 4L O2 via [place]      Pertinent Vitals/Pain Pain Assessment: Faces Faces Pain Scale: Hurts little more Pain Location: chest with mobility Pain Descriptors / Indicators: Aching Pain Intervention(s): Limited activity within patient's tolerance;Monitored during session;Repositioned    Home Living                      Prior Function            PT Goals (current goals can now be found in the care plan section) Progress towards PT goals: Progressing toward goals  Frequency    Min 3X/week      PT Plan Current plan remains appropriate    Co-evaluation              AM-PAC PT "6 Clicks" Daily Activity  Outcome Measure  Difficulty turning over in bed (including adjusting bedclothes, sheets and blankets)?: Unable Difficulty moving from lying on back to sitting on the side of the bed? : Unable Difficulty sitting down on and standing up from a chair with arms (e.g., wheelchair, bedside commode, etc,.)?: Unable Help needed moving to and from a bed to chair (including a wheelchair)?: A Little Help needed walking in hospital room?: A Lot Help needed climbing 3-5 steps with a railing? : Total 6 Click Score: 9    End of Session Equipment Utilized  During Treatment: Gait belt;Oxygen Activity Tolerance: Treatment limited secondary to medical complications (Comment);Patient limited by fatigue;Patient tolerated treatment well(dizziness; SpO2 desat) Patient left: in chair;with call bell/phone within reach;with chair alarm set Nurse Communication: Mobility status PT Visit Diagnosis: Unsteadiness on feet (R26.81);History of falling (Z91.81);Muscle weakness (generalized) (M62.81);Other abnormalities of gait and mobility (R26.89);Difficulty in walking, not elsewhere classified (R26.2)     Time: 1400-1433 PT Time Calculation (min) (ACUTE ONLY): 33 min  Charges:  $Gait Training: 8-22 mins $Therapeutic Activity: 8-22 mins                     Erline Levine, PTA Acute Rehabilitation Services Pager: [phone] Office: [phone]     Carolynne Edouard 03/02/2018, 4:17 PM

## 2018-03-02 NOTE — Progress Notes (Signed)
Inpatient Rehabilitation Admissions Coordinator  I met with patient at bedside to discuss goals and expectations of an inpt rehab admission. He is willing to receive rehab services here and then once more functional, to return to [place] with his fiance. Noted updated OT assessment today. I await PT assessment today and then will pursue insurance approval with [facility] for a possible admit pending their approval. I will follow up tomorrow.  Danne Baxter, RN, MSN Rehab Admissions Coordinator [phone] 03/02/2018 1:31 PM

## 2018-03-02 NOTE — Progress Notes (Signed)
Nutrition Follow-up  DOCUMENTATION CODES:   Non-severe (moderate) malnutrition in context of chronic illness  INTERVENTION:   - Continue Regular diet  - Continue Ensure Enlive po TID, each supplement provides 350 kcal and 20 grams of protein (chocolate or strawberry flavors only)  - Continue to encourage adequate PO intake  NUTRITION DIAGNOSIS:   Moderate Malnutrition related to chronic illness (MG, COPD) as evidenced by moderate fat depletion, moderate muscle depletion.  Ongoing  GOAL:   Patient will meet greater than or equal to 90% of their needs  Progressing  MONITOR:   PO intake, Supplement acceptance  ASSESSMENT:   Pt with PMH of myasthenia gravis on imuran, COPD, PE on Xarelto, and CAD. Pt was admitted 9/6 after 6 weeks of productive cough now tx to ICU 9/13 with acute hypoxemic respiratory failure secondary to CAP in an immunosuppressed pt with possible atypical organism.   9/22 - extubated, Cortrak removed, TF stopped 9/27 - transferred out of the ICU  Pt's weight is down 9 lbs since first measured weight at admission. Noted pt's weight has fluctuated significantly between 151-180 lbs since admission. Suspect weight fluctuations are related to fluid status. Noted pt with progressive 3 lb weight gain over the past 3 days. Will continue to monitor for trends.  Spoke with pt at bedside who reports that his appetite is "getting better" and that he did well with breakfast until he spilled his Cheerios. Pt shares that he believes he ate at least 50% of his breakfast meal. Meal completion this AM recorded as 100%.  Pt endorses drinking the Ensure Enlive supplements that are ordered for him and states that he prefers the chocolate and strawberry flavors.  Pt denies any nausea or vomiting. Pt also denies difficulty chewing or swallowing. Pt reports that he will be discharging to CIR.  Meal Completion: 5-100% since extubation (gradually improving)  Medications reviewed  and include: Oscal with D daily, Ensure Enlive TID, levothyroxine 175 mcg daily, Remeron 15 mg daily, Protonix 40 mg daily, Senokot-S BID, vitamin B-12 1000 mcg daily  Labs reviewed: BUN 30 (H)  UOP: 1150 ml x 24 hours I/O's: +14.6 L since admit  Diet Order:   Diet Order            Diet regular Room service appropriate? Yes; Fluid consistency: Thin  Diet effective now              EDUCATION NEEDS:   No education needs have been identified at this time  Skin:  Skin Assessment: Reviewed RN Assessment (MASD to buttocks)  Last BM:  03/01/18  Height:   Ht Readings from Last 1 Encounters:  02/09/18 6\' 1"  (1.854 m)    Weight:   Wt Readings from Last 1 Encounters:  03/02/18 70.2 kg    Ideal Body Weight:  83.6 kg  BMI:  Body mass index is 20.42 kg/m.  Estimated Nutritional Needs:   Kcal:  2100-2300  Protein:  105-125 gm  Fluid:  > 2 L     Earma Reading, MS, RD, LDN Inpatient Clinical Dietitian Pager: [phone] Weekend/After Hours: [phone]

## 2018-03-02 NOTE — Progress Notes (Signed)
PROGRESS NOTE        PATIENT DETAILS Name: Curtis Preston Age: 67 y.o. Sex: male Date of Birth: 07-Nov-1950 Admit Date: 02/06/2018 Admitting Physician Ivor Costa, MD PCP:No primary care provider on file.  Brief Narrative: Patient is a 67 y.o. male with history of myasthenia gravis on Imuran, PE on Xarelto, COPD not on home O2 who originally is from [place] and is here in town for his brother-in-law's funeral, he developed worsening shortness of breath and cough (started since August but worsened acutely)-he subsequently presented to the ED and was found to have pneumonia and started on empiric antibiotics.  Ultimately patient worsened and required intubation on 9/13.  While in the ICU, patient underwent bronchoscopy with negative BAL.  Upon clinical stability he was extubated and transferred to the trial hospitalist service on 9/26.  Hospital course has been complicated by ICU delirium, severe deconditioning and ongoing hypoxia but gradually improving.  See below for further details  Significant events: 9/13 - intubated 9/13 - bronch with BAL 9/19 - repeated bronch with BAL 9/22 - extubated, remained in the ICU due to delirium   Subjective:  Patient in bed, appears comfortable, denies any headache, no fever, no chest pain or pressure, no shortness of breath , no abdominal pain. No focal weakness.  Assessment/Plan:  Acute hypoxic respiratory failure secondary to multifocal pneumonia and now evidence of acute on chronic diastolic heart failure EF 55 to 60%: Patient had strep pneumonia bacteremia under the care of ICU team and has finished antibiotics for his infectious process.   He was still short of breath and was requiring 5 L nasal cannula oxygen until few days ago, likely developed acute on chronic diastolic CHF, echo was obtained, he was given 3 doses of IV Lasix, he is now down to 1 L nasal cannula oxygen and feels better.  Continue pulmonary toiletry,  encouraged to increase activity.  Repeat PT eval today.  If stable discharge tomorrow either to SNF or home health PT.  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  Slowly taper off steroids.  Leukocytosis: Likely secondary to steroids-slowly improving-completed a course of antimicrobial therapy.  Follow.  Myasthenia gravis: Seen by neurology-not felt to have myasthenic crisis-Imuran remains on hold.  Continue Mestinon.  COPD: Not Wheezing-moving air well-continue bronchodilators  Hypothyroidism: Continue Synthroid  History of pulmonary embolism: Continue rivaroxaban.  ICU delirium: Resolved  Severe deconditioning: Continue PT, may require SNF versus CIR.  Eager to be discharged home to [place].  Plan is to drive back which is clearly not safe.  Patient counseled.    DVT Prophylaxis: Full dose anticoagulation with Xarelto  Code Status: Full code   Family Communication: None at bedside  Disposition Plan: Likely discharge in the next 1 to 2 days to SNF if week or home if physically able to  Antimicrobial agents: Anti-infectives (From admission, onward)   Start     Dose/Rate Route Frequency Ordered Stop   02/14/18 1400  imipenem-cilastatin (PRIMAXIN) 750 mg in sodium chloride 0.9 % 100 mL IVPB     750 mg 200 mL/hr over 30 Minutes Intravenous Every 8 hours 02/14/18 1031 02/17/18 2322   02/13/18 1330  sulfamethoxazole-trimethoprim (BACTRIM) 370 mg in dextrose 5 % 500 mL IVPB     370 mg 348.8 mL/hr over 90 Minutes Intravenous Every 8  hours 02/13/18 1238 02/17/18 2332   02/09/18 1600  ciprofloxacin (CIPRO) IVPB 400 mg  Status:  Discontinued     400 mg 200 mL/hr over 60 Minutes Intravenous Every 12 hours 02/09/18 1516 02/13/18 1140   02/09/18 1200  ciprofloxacin (CIPRO) tablet 500 mg  Status:  Discontinued     500 mg Oral 2 times daily 02/09/18 1159 02/09/18 1211   02/08/18 0100  vancomycin (VANCOCIN) IVPB 1000 mg/200 mL premix   Status:  Discontinued     1,000 mg 200 mL/hr over 60 Minutes Intravenous Every 24 hours 02/07/18 0107 02/09/18 1159   02/08/18 0000  azithromycin (ZITHROMAX) 250 mg in dextrose 5 % 125 mL IVPB  Status:  Discontinued     250 mg 125 mL/hr over 60 Minutes Intravenous Every 24 hours 02/07/18 0032 02/09/18 1159   02/07/18 0600  ceFEPIme (MAXIPIME) 1 g in sodium chloride 0.9 % 100 mL IVPB  Status:  Discontinued     1 g 200 mL/hr over 30 Minutes Intravenous Every 8 hours 02/07/18 0032 02/09/18 1159   02/07/18 0045  ceFEPIme (MAXIPIME) 1 g in sodium chloride 0.9 % 100 mL IVPB  Status:  Discontinued     1 g 200 mL/hr over 30 Minutes Intravenous Every 8 hours 02/07/18 0031 02/07/18 0032   02/07/18 0045  vancomycin (VANCOCIN) 1,500 mg in sodium chloride 0.9 % 500 mL IVPB     1,500 mg 250 mL/hr over 120 Minutes Intravenous  Once 02/07/18 0042 02/07/18 0559   02/06/18 2330  cefTRIAXone (ROCEPHIN) 1 g in sodium chloride 0.9 % 100 mL IVPB     1 g 200 mL/hr over 30 Minutes Intravenous  Once 02/06/18 2326 02/07/18 0048   02/06/18 2330  azithromycin (ZITHROMAX) 500 mg in sodium chloride 0.9 % 250 mL IVPB     500 mg 250 mL/hr over 60 Minutes Intravenous  Once 02/06/18 2326 02/07/18 0201      Procedures: 9/13 -Intubated for hypoxia.  9/13 Bronch BAL 9/19 Repeat bronch, BAL TTE - EF 55%, Normal LV systolic function; mild diastolic dysfunction.  CONSULTS:  pulmonary/intensive care, neurology and CTVS  Time spent: 25- minutes-Greater than 50% of this time was spent in counseling, explanation of diagnosis, planning of further management, and coordination of care.  MEDICATIONS: Scheduled Meds: . arformoterol  15 mcg Nebulization BID  . aspirin  81 mg Oral Daily  . atorvastatin  40 mg Oral QHS  . baclofen  10 mg Oral TID  . calcium-vitamin D  1 tablet Oral Daily  . Chlorhexidine Gluconate Cloth  6 each Topical Daily  . feeding supplement (ENSURE ENLIVE)  237 mL Oral TID BM  . levothyroxine  175  mcg Oral QAC breakfast  . mouth rinse  15 mL Mouth Rinse BID  . mirtazapine  15 mg Oral QHS  . montelukast  10 mg Oral QHS  . pantoprazole  40 mg Oral Q1200  . predniSONE  60 mg Oral Q breakfast  . pregabalin  50 mg Oral QHS  . pyridostigmine  30 mg Oral QID  . rivaroxaban  20 mg Oral Q supper  . senna-docusate  1 tablet Oral BID  . tiotropium  18 mcg Inhalation Daily  . vitamin B-12  1,000 mcg Oral Daily   Continuous Infusions: . sodium chloride 10 mL/hr at 02/23/18 0600   PRN Meds:.sodium chloride, acetaminophen (TYLENOL) oral liquid 160 mg/5 mL, albuterol, benzonatate, polyethylene glycol   PHYSICAL EXAM: Vital signs: Vitals:   03/02/18 6195 03/02/18 0548 03/02/18 0932  03/02/18 0833  BP:  112/70    Pulse:  67    Resp:  18    Temp:  (!) 97.5 F (36.4 C)    TempSrc:  Oral    SpO2:  98% 90% 90%  Weight: 70.2 kg     Height:       Filed Weights   02/28/18 0500 03/01/18 0600 03/02/18 0542  Weight: 68.6 kg 69.8 kg 70.2 kg   Body mass index is 20.42 kg/m.   Exam  Frail, elderly Caucasian male sitting in hospital bed, awake Alert, Oriented X 3, No new F.N deficits, Normal affect [place].AT,PERRAL Supple Neck,No JVD, No cervical lymphadenopathy appriciated.  Symmetrical Chest wall movement, Good air movement bilaterally, CTAB RRR,No Gallops, Rubs or new Murmurs, No Parasternal Heave +ve B.Sounds, Abd Soft, No tenderness, No organomegaly appriciated, No rebound - guarding or rigidity. No Cyanosis, Clubbing or edema, No new Rash or bruise    I have personally reviewed following labs and imaging studies  LABORATORY DATA: CBC: Recent Labs  Lab 02/24/18 0420 02/25/18 0734 02/26/18 0528 02/27/18 0331 02/28/18 0757  WBC 10.5 12.9* 16.0* 15.9* 14.4*  NEUTROABS  --  8.4*  --  10.7*  --   HGB 9.8* 10.3* 11.1* 10.9* 11.4*  HCT 30.5* 31.8* 34.1* 34.1* 35.7*  MCV 98.4 97.8 98.0 97.2 98.3  PLT 560* 562* 578* 560* 545*    Basic Metabolic Panel: Recent Labs  Lab  02/24/18 0420 02/25/18 0734 02/26/18 0528 02/27/18 0331 02/28/18 0757 03/01/18 0426  NA 143 140 140 139 139 140  K 3.2* 3.6 3.4* 3.6 3.4* 4.0  CL 105 104 111 103 104 106  CO2 _0 GLUCOSE 97 88 85 99 87 98  BUN 27* _1 30*  CREATININE 0.68 0.72 0.64 0.73 0.70 0.81  CALCIUM 8.1* 7.7* 7.2* 8.1* 8.0* 7.7*  MG 1.9 1.9 1.7 1.9  --  1.8  PHOS 3.0 3.3 2.9  --   --   --     GFR: Estimated Creatinine Clearance: 85.5 mL/min (by C-G formula based on SCr of 0.81 mg/dL).  Liver Function Tests: No results for input(s): AST, ALT, ALKPHOS, BILITOT, PROT, ALBUMIN in the last 168 hours. No results for input(s): LIPASE, AMYLASE in the last 168 hours. No results for input(s): AMMONIA in the last 168 hours.  Coagulation Profile: No results for input(s): INR, PROTIME in the last 168 hours.  Cardiac Enzymes: No results for input(s): CKTOTAL, CKMB, CKMBINDEX, TROPONINI in the last 168 hours.  BNP (last 3 results) No results for input(s): PROBNP in the last 8760 hours.  HbA1C: No results for input(s): HGBA1C in the last 72 hours.  CBG: Recent Labs  Lab 02/25/18 0445 02/25/18 0803 02/25/18 1103 02/25/18 1511 02/25/18 1926  GLUCAP 81 71 83 142* 97    Lipid Profile: No results for input(s): CHOL, HDL, LDLCALC, TRIG, CHOLHDL, LDLDIRECT in the last 72 hours.  Thyroid Function Tests: No results for input(s): TSH, T4TOTAL, FREET4, T3FREE, THYROIDAB in the last 72 hours.  Anemia Panel: No results for input(s): VITAMINB12, FOLATE, FERRITIN, TIBC, IRON, RETICCTPCT in the last 72 hours.  Urine analysis:    Component Value Date/Time   COLORURINE YELLOW 02/20/2018 0508   APPEARANCEUR CLEAR 02/20/2018 0508   LABSPEC 1.021 02/20/2018 0508   PHURINE 5.0 02/20/2018 0508   GLUCOSEU NEGATIVE 02/20/2018 0508   HGBUR MODERATE (A) 02/20/2018 0508   BILIRUBINUR NEGATIVE 02/20/2018 0508   KETONESUR NEGATIVE 02/20/2018 8938  PROTEINUR NEGATIVE 02/20/2018 0508   NITRITE  NEGATIVE 02/20/2018 0508   LEUKOCYTESUR NEGATIVE 02/20/2018 0508    Sepsis Labs: Lactic Acid, Venous    Component Value Date/Time   LATICACIDVEN 0.6 02/20/2018 3419    MICROBIOLOGY: No results found for this or any previous visit (from the past 240 hour(s)).  RADIOLOGY STUDIES/RESULTS: Dg Chest 2 View  Result Date: 02/28/2018 CLINICAL DATA:  Recent pneumonia EXAM: CHEST - 2 VIEW COMPARISON:  02/26/2018 FINDINGS: Normal heart size. Left subclavian Port-A-Cath is stable. Patchy airspace opacities throughout both lungs are stable. No pneumothorax. No pleural effusion. Mid-level thoracic compression deformity is stable compare with a recent CT. IMPRESSION: Stable bilateral airspace opacities. Electronically Signed   By: Marybelle Killings M.D.   On: 02/28/2018 15:13   Dg Chest 2 View  Result Date: 02/08/2018 CLINICAL DATA:  Chest pain and shortness of breath EXAM: CHEST - 2 VIEW COMPARISON:  February 06, 2018 FINDINGS: The left central line is stable. There appears to be a small bulla in the left apex. No pneumothorax. Bilateral pulmonary opacities most prominent in the mid lower lungs. No other interval changes or acute abnormalities. IMPRESSION: 1. Stable left Port-A-Cath. 2. Bilateral pulmonary opacities may represent worsening edema versus worsening bilateral pneumonia. Recommend clinical correlation and follow-up to resolution. Electronically Signed   By: Dorise Bullion III M.D   On: 02/08/2018 07:08   Dg Chest 2 View  Result Date: 02/06/2018 CLINICAL DATA:  Shortness of breath cough and congestion for 3 days. EXAM: CHEST - 2 VIEW COMPARISON:  None. FINDINGS: Left subclavian injectable port terminates at the expected location of the cavoatrial junction. Cardiomediastinal silhouette is normal. Mediastinal contours appear intact. There is no evidence of pleural effusion or pneumothorax. Patchy interstitial and alveolar opacities in bilateral lungs, with particular prominence in the right upper lobe,  right middle lobe and left lower lobe. Possible pulmonary nodule in the right mid lung field. Osseous structures are without acute abnormality. Soft tissues are grossly normal. IMPRESSION: Patchy interstitial and alveolar opacities in bilateral lungs. Findings may represent multifocal pneumonia or developing asymmetric pulmonary edema. Possible pulmonary nodule in the right mid lung field. Electronically Signed   By: Fidela Salisbury M.D.   On: 02/06/2018 21:12   Dg Abd 1 View  Result Date: 02/15/2018 CLINICAL DATA:  Nausea and vomiting EXAM: ABDOMEN - 1 VIEW COMPARISON:  None. FINDINGS: Scattered large and small bowel gas is noted. Cecum is mildly prominent at almost 12 cm. No free air is seen. Nasogastric catheter is noted within the stomach. No bony abnormality is seen. No mass lesion is noted. IMPRESSION: Gas dilated cecum to 12 cm. Nasogastric catheter within the stomach. Electronically Signed   By: Inez Catalina M.D.   On: 02/15/2018 09:46   Ct Chest Wo Contrast  Result Date: 02/18/2018 CLINICAL DATA:  Increased secretions, ventilator dependent. EXAM: CT CHEST WITHOUT CONTRAST TECHNIQUE: Multidetector CT imaging of the chest was performed following the standard protocol without IV contrast. COMPARISON:  Radiograph of February 16, 2018. CT scan of February 08, 2018. FINDINGS: Cardiovascular: Atherosclerosis of thoracic aorta is noted without aneurysm formation. Coronary artery calcifications are noted. Normal cardiac size. No pericardial effusion. Mediastinum/Nodes: Feeding tube is seen passing through esophagus into stomach. Significantly increased mediastinal adenopathy is noted compared to prior exam, most likely inflammatory in etiology; this includes 14 mm precarinal lymph node. 11 mm right paratracheal lymph node is noted and and increased compared to prior exam. 12 mm left hilar lymph node is noted  which is enlarged compared to prior exam. Tracheostomy tube is well positioned. Lungs/Pleura: No  pneumothorax is noted. Emphysematous disease is again noted. Significantly increased lung opacities are noted bilaterally and diffusely most consistent with worsening pneumonia. No significant pleural effusion is noted. Upper Abdomen: No acute abnormality. Musculoskeletal: No chest wall mass or suspicious bone lesions identified. IMPRESSION: Significantly increased airspace opacities are noted throughout both lungs consistent with severely worsening pneumonia. Increased mediastinal adenopathy is also noted most likely is inflammatory in etiology. Coronary artery calcifications are noted. Aortic Atherosclerosis (ICD10-I70.0) and Emphysema (ICD10-J43.9). Electronically Signed   By: Marijo Conception, M.D.   On: 02/18/2018 16:50   Ct Chest Wo Contrast  Result Date: 02/08/2018 CLINICAL DATA:  Progressive hypoxemia.  Pneumonia. EXAM: CT CHEST WITHOUT CONTRAST TECHNIQUE: Multidetector CT imaging of the chest was performed following the standard protocol without IV contrast. COMPARISON:  Chest x-ray 02/08/2018 FINDINGS: Cardiovascular: Heart size is normal. Atherosclerotic calcifications are present coronary arteries. Atherosclerotic changes are present at the aortic arch. Pulmonary arteries mildly prominent bilaterally. No significant pericardial effusion is present. Mediastinum/Nodes: No significant mediastinal, axillary, or hilar adenopathy is present. The esophagus is within normal limits. The thoracic inlet is unremarkable. Lungs/Pleura: Extensive confluent ground-glass attenuation is present throughout the lower lobes, the lingula, and right middle lobe. Consolidation is most evident in the superior segment of the left lower lobe. Extensive centrilobular emphysematous changes are present. No significant pleural effusion is present. Upper Abdomen: Unremarkable. Musculoskeletal: A remote superior endplate fractures present at T7. Vertebral body heights alignment are otherwise maintained. No focal lytic or blastic  lesions are present. Ribs are within normal limits. IMPRESSION: 1. Extensive confluent ground-glass attenuation throughout both lungs with consolidation more prevalent in the superior segment of the left lower lobe. The findings are nonspecific, but most concerning for multi lobar pneumonia. Edema or possibly could have a similar appearance. This is to extensive for atelectasis. Consolidation in the superior segment in the left lower lobe suggests infection. 2.  Aortic Atherosclerosis (ICD10-I70.0). 3.  Emphysema (ICD10-J43.9). 4. Coronary artery disease Electronically Signed   By: San Morelle M.D.   On: 02/08/2018 11:47   Dg Chest Port 1 View  Result Date: 02/26/2018 CLINICAL DATA:  Acute respiratory failure EXAM: PORTABLE CHEST 1 VIEW COMPARISON:  10/25/2017 FINDINGS: Left subclavian Port-A-Cath is stable. The heart is borderline enlarged. Patchy bilateral airspace disease right greater than left is stable. No pneumothorax. No pleural effusion. IMPRESSION: Stable bilateral airspace disease right greater than left. Electronically Signed   By: Marybelle Killings M.D.   On: 02/26/2018 09:26   Dg Chest Port 1 View  Result Date: 02/25/2018 CLINICAL DATA:  Respiratory failure, shortness of breath. EXAM: PORTABLE CHEST 1 VIEW COMPARISON:  Radiograph of February 22, 2018. FINDINGS: Stable cardiomediastinal silhouette. Endotracheal and nasogastric tubes have been removed. Left subclavian Port-A-Cath is unchanged in position. No pneumothorax is noted. Stable bilateral lung opacities are noted concerning for multifocal pneumonia, most prominently seen in left lung base. Mild left pleural effusion may be present. Bony thorax is unremarkable. IMPRESSION: Endotracheal and nasogastric tubes have been removed. Stable bilateral lung opacities are noted consistent with multifocal pneumonia. Mild left pleural effusion is noted. Electronically Signed   By: Marijo Conception, M.D.   On: 02/25/2018 10:29   Dg Chest Port 1  View  Result Date: 02/22/2018 CLINICAL DATA:  Respiratory distress. Follow-up exam. Interstitial lung disease EXAM: PORTABLE CHEST 1 VIEW COMPARISON:  02/19/2018 and older exams. FINDINGS: Hazy, patchy areas of airspace  lung opacity are superimposed on chronic interstitial thickening, without significant change compared to the most recent prior exam. There are no new lung abnormalities. No convincing pleural effusion.  No pneumothorax. Right subclavian central line has been removed. Left subclavian central venous line, endotracheal tube and enteric tube are stable. IMPRESSION: 1. No change in lung aeration since the most recent prior exam. Airspace opacities consistent with multifocal pneumonia are superimposed on chronic interstitial lung disease. 2. Remaining support apparatus is stable and well positioned. Electronically Signed   By: Lajean Manes M.D.   On: 02/22/2018 08:00   Dg Chest Port 1 View  Result Date: 02/19/2018 CLINICAL DATA:  Interstitial lung disease, evaluate endotracheal tube placement EXAM: PORTABLE CHEST 1 VIEW COMPARISON:  CT chest of 02/18/2018 and chest x-ray of 02/16/2017 FINDINGS: The tip of the endotracheal tube is approximately 5.8 cm above the carina. Airspace disease noted recently has improved somewhat in the interval. No pleural effusion is seen. Right central venous line tip overlies the upper SVC and a left central venous line tip overlies the lower SVC. The heart is within upper limits of normal and stable. Feeding tube extends below the hemidiaphragm. IMPRESSION: 1. Tip of endotracheal tube 5.8 cm above the carina. 2. Some improvement in patchy airspace disease bilaterally. Electronically Signed   By: Ivar Drape M.D.   On: 02/19/2018 14:09   Dg Chest Port 1 View  Result Date: 02/16/2018 CLINICAL DATA:  Acute respiratory failure with hypoxia EXAM: PORTABLE CHEST 1 VIEW COMPARISON:  Yesterday FINDINGS: Endotracheal tube tip between the clavicular heads and carina. An  orogastric tube at least reaches the pylorus. Bilateral central line with tip at the SVC. Unchanged asymmetric airspace disease, presumed infection in this immunocompromised patient. IMPRESSION: 1. Stable hardware positioning. The orogastric tube may intubate the duodenum. 2. Unchanged airspace disease. Electronically Signed   By: Monte Fantasia M.D.   On: 02/16/2018 07:12   Dg Chest Port 1 View  Result Date: 02/15/2018 CLINICAL DATA:  Intubation. History of COPD. EXAM: PORTABLE CHEST 1 VIEW COMPARISON:  02/14/2018 FINDINGS: Endotracheal tube with tip measuring 7 cm above the carina. Enteric tube tip is off the field of view but below the left hemidiaphragm. Bilateral central venous catheters with tips over the distal SVC region. No pneumothorax. Heart size is normal. There is patchy airspace disease throughout both lungs, demonstrating progression since previous study. No blunting of costophrenic angles. IMPRESSION: Appliances appear to be in satisfactory position. Diffuse bilateral airspace disease in the lungs, demonstrating progression since previous study. Electronically Signed   By: Lucienne Capers M.D.   On: 02/15/2018 22:25   Dg Chest Port 1 View  Result Date: 02/14/2018 CLINICAL DATA:  History of endotracheal tube EXAM: PORTABLE CHEST 1 VIEW COMPARISON:  Yesterday FINDINGS: Endotracheal tube tip between the clavicular heads and carina. An orogastric tube and side-port reaches the stomach. Gas is seen within the stomach although decreased from prior. Bilateral subclavian line with tip at the SVC. Diffuse interstitial and airspace opacity, stable. Normal heart size and stable mediastinal contours. No effusion or pneumothorax. IMPRESSION: 1. Unremarkable hardware positioning. 2. Persistent airspace disease in the setting of immunocompromise status post recent BAL. Electronically Signed   By: Monte Fantasia M.D.   On: 02/14/2018 08:12   Dg Chest Port 1 View  Result Date: 02/13/2018 CLINICAL DATA:   History of ETT EXAM: PORTABLE CHEST 1 VIEW COMPARISON:  02/13/2018, 02/12/2018, 02/10/2018, CT chest 02/08/2017 FINDINGS: Endotracheal tube tip is about 4.5 cm superior to  the carina. Esophageal tube tip is below the diaphragm but non included. Bilateral central venous catheter tips over the SVC. Stable cardiomediastinal silhouette. Interval worsening of interstitial and ground-glass opacity within the bilateral lungs. No pneumothorax. IMPRESSION: 1. Endotracheal tube tip about 4.5 cm superior to the carina. 2. Slight worsening of diffuse bilateral interstitial and ground-glass edema or infiltrates. Electronically Signed   By: Donavan Foil M.D.   On: 02/13/2018 23:39   Dg Chest Port 1 View  Result Date: 02/13/2018 CLINICAL DATA:  Endotracheal tube placement. EXAM: PORTABLE CHEST 1 VIEW COMPARISON:  02/12/2018 FINDINGS: Endotracheal tube has tip 7.3 cm above the carina. Left subclavian central venous catheter is unchanged. Interval placement of right subclavian central venous catheter with tip over the SVC. Lungs are adequately inflated demonstrate continued bilateral hazy interstitial process right worse than left with slight interval improved aeration. No effusion. No pneumothorax. Cardiomediastinal silhouette and remainder the exam is unchanged. IMPRESSION: Persistent bilateral hazy interstitial process with slight improved aeration. Tubes and lines as described.  No pneumothorax. Electronically Signed   By: Marin Olp M.D.   On: 02/13/2018 15:13   Dg Chest Port 1 View  Result Date: 02/12/2018 CLINICAL DATA:  Hypoxia. EXAM: PORTABLE CHEST 1 VIEW COMPARISON:  Prior exams over the last week with radiographs. Chest CT 02/08/2018. No remote exams available. FINDINGS: Left central line with tip in the SVC. Diffuse interstitial/ground-glass opacities with progression in the right suprahilar lung. Unchanged heart size and mediastinal contours. Again seen apical emphysema. No pneumothorax or pleural effusion.  IMPRESSION: Diffuse interstitial/ground-glass opacities with progression in the right suprahilar lung. Differential considerations include infectious or inflammatory etiologies or pulmonary edema. Given history of Sjogren's syndrome, interstitial lung disease is also considered. No remote exams available for comparison. Electronically Signed   By: Keith Rake M.D.   On: 02/12/2018 21:19   Dg Chest Port 1 View  Result Date: 02/10/2018 CLINICAL DATA:  Shortness of breath, respiratory failure EXAM: PORTABLE CHEST 1 VIEW COMPARISON:  02/08/2018 FINDINGS: Diffuse interstitial and alveolar opacities throughout the lungs. Heart is borderline in size. No visible effusions or acute bony abnormality. Minimal change since prior study. IMPRESSION: Continued bilateral diffuse pulmonary opacities which could reflect edema or pneumonia. Electronically Signed   By: Rolm Baptise M.D.   On: 02/10/2018 09:25   Dg Abd Portable 1v  Result Date: 02/18/2018 CLINICAL DATA:  Feeding tube placement. EXAM: PORTABLE ABDOMEN - 1 VIEW COMPARISON:  Radiograph of February 16, 2018. FINDINGS: The bowel gas pattern is normal. Distal tip of feeding tube appears to be in expected position of fourth portion of duodenum near the ligament of Treitz. No radio-opaque calculi or other significant radiographic abnormality are seen. IMPRESSION: Distal tip of feeding tube has been advanced into expected position of fourth portion of duodenum near ligament of Treitz. Electronically Signed   By: Marijo Conception, M.D.   On: 02/18/2018 10:56   Dg Abd Portable 1v  Result Date: 02/16/2018 CLINICAL DATA:  Check feeding catheter placement EXAM: PORTABLE ABDOMEN - 1 VIEW COMPARISON:  02/15/2018 FINDINGS: Nasogastric catheter has been removed. A feeding catheter is now seen and appears to extend beyond the pylorus into the first portion of the duodenum. Persistent cecal prominence is noted. No other focal abnormality is seen. IMPRESSION: Feeding  catheter which appears to lie within the first portion of the duodenum. Electronically Signed   By: Inez Catalina M.D.   On: 02/16/2018 16:48   Dg Swallowing Func-speech Pathology  Result Date: 02/23/2018  Objective Swallowing Evaluation: Type of Study: MBS-Modified Barium Swallow Study  Patient Details Name: Jaspal Pultz MRN: [id number] Date of Birth: 1950/10/27 Today's Date: 02/23/2018 Time: SLP Start Time (ACUTE ONLY): 4765 -SLP Stop Time (ACUTE ONLY): 1042 SLP Time Calculation (min) (ACUTE ONLY): 9 min Past Medical History: Past Medical History: Diagnosis Date . Bronchiectasis ([place])  . CAD (coronary artery disease)  . COPD (chronic obstructive pulmonary disease) ([place])  . Depression with anxiety  . GERD (gastroesophageal reflux disease)  . Hypothyroidism  . Myasthenia gravis ([place])  . PE (pulmonary thromboembolism) ([place])  . Sjogren's disease ([place])  . Tobacco abuse  Past Surgical History: Past Surgical History: Procedure Laterality Date . Right ulnar nerve impingement   HPI: 67 year old male with PMH as below, which is significant for Myasthenia gravis on imuran, COPD, PE on Xarelto, and CAD. He presented to ED with worsening shortness of breath and workup revealed pneumonia. Pt required multiple intubations across 9 days with one self-extubation.  Subjective: 67 year old male with PMH as below, which is significant for Myasthenia gravis on imuran, COPD, PE on Xarelto, and CAD. He presented to ED with worsening shortness of breath and workup revealed pneumonia. Pt required multiple intubations across 9 days with one self-extubation. Assessment / Plan / Recommendation CHL IP CLINICAL IMPRESSIONS 02/23/2018 Clinical Impression Pt presents with functional oropharyngeal swallowing.  There was trace, transient laryngeal penetration during the swallow with serial straw sips of thin liquid, which was spontaneously cleared.  Pt exhibited prompt and efficient mastication and oral transit of solids.  With pill  simulation there was no stasis of tablet or increase penetration.  Tablet fully cleared on esophageal sweep.  Recommend regular texture diet with thin liquid. SLP Visit Diagnosis Dysphagia, unspecified (R13.10) Attention and concentration deficit following -- Frontal lobe and executive function deficit following -- Impact on safety and function No limitations   CHL IP TREATMENT RECOMMENDATION 02/23/2018 Treatment Recommendations Therapy as outlined in treatment plan below   Prognosis 02/23/2018 Prognosis for Safe Diet Advancement Good Barriers to Reach Goals -- Barriers/Prognosis Comment -- CHL IP DIET RECOMMENDATION 02/23/2018 SLP Diet Recommendations Regular solids;Thin liquid Liquid Administration via Cup;Straw Medication Administration Whole meds with liquid Compensations Minimize environmental distractions;Slow rate;Small sips/bites Postural Changes Seated upright at 90 degrees   CHL IP OTHER RECOMMENDATIONS 02/23/2018 Recommended Consults -- Oral Care Recommendations Oral care BID Other Recommendations --   No flowsheet data found.  CHL IP FREQUENCY AND DURATION 02/23/2018 Speech Therapy Frequency (ACUTE ONLY) min 1 x/week Treatment Duration 1 week      CHL IP ORAL PHASE 02/23/2018 Oral Phase WFL Oral - Pudding Teaspoon -- Oral - Pudding Cup -- Oral - Honey Teaspoon -- Oral - Honey Cup -- Oral - Nectar Teaspoon -- Oral - Nectar Cup -- Oral - Nectar Straw -- Oral - Thin Teaspoon -- Oral - Thin Cup -- Oral - Thin Straw -- Oral - Puree -- Oral - Mech Soft -- Oral - Regular -- Oral - Multi-Consistency -- Oral - Pill -- Oral Phase - Comment --  CHL IP PHARYNGEAL PHASE 02/23/2018 Pharyngeal Phase Impaired Pharyngeal- Pudding Teaspoon -- Pharyngeal -- Pharyngeal- Pudding Cup -- Pharyngeal -- Pharyngeal- Honey Teaspoon -- Pharyngeal -- Pharyngeal- Honey Cup -- Pharyngeal -- Pharyngeal- Nectar Teaspoon -- Pharyngeal -- Pharyngeal- Nectar Cup -- Pharyngeal -- Pharyngeal- Nectar Straw -- Pharyngeal -- Pharyngeal- Thin Teaspoon  -- Pharyngeal -- Pharyngeal- Thin Cup [facility] Pharyngeal Material does not enter airway Pharyngeal- Thin Straw Delayed swallow initiation-vallecula;Penetration/Aspiration during swallow  Pharyngeal Material enters airway, remains ABOVE vocal cords then ejected out Pharyngeal- Puree -- Pharyngeal -- Pharyngeal- Mechanical Soft -- Pharyngeal -- Pharyngeal- Regular -- Pharyngeal -- Pharyngeal- Multi-consistency -- Pharyngeal -- Pharyngeal- Pill -- Pharyngeal -- Pharyngeal Comment --  CHL IP CERVICAL ESOPHAGEAL PHASE 02/23/2018 Cervical Esophageal Phase WFL Pudding Teaspoon -- Pudding Cup -- Honey Teaspoon -- Honey Cup -- Nectar Teaspoon -- Nectar Cup -- Nectar Straw -- Thin Teaspoon -- Thin Cup -- Thin Straw -- Puree -- Mechanical Soft -- Regular -- Multi-consistency -- Pill -- Cervical Esophageal Comment -- Celedonio Savage, MA, [place] Office: [phone] 02/23/2018, 11:27 AM                LOS: 22 days   Signature  Lala Lund M.D on 03/02/2018 at 11:27 AM  To page go to [email or web address] - password [facility]

## 2018-03-02 NOTE — Progress Notes (Signed)
Occupational Therapy Treatment Patient Details Name: Curtis Preston MRN: [id number] DOB: 1950/08/23 Today's Date: 03/02/2018    History of present illness 67 yo admitted with SOB, multifocal PNA and sepsis. PMhx: myasthenia gravis, CoPD, PE, CAD Intubated 9/13 for hypoxia, Extubated 9/23. Remained in ICU until 9/26 for delrium    OT comments  Pt seen for ADL retraining treatment session today with focus on safety with DME, pursed lip breathing, functional mobility and transfers. He currently requires consistent verbal and multimodal cues for safety & pursed lip breathing during ADL's as well as frequent rest breaks. Decreased awareness of deficits. Pt cont to be limited significantly by deconditioned state and should benefit from CIR Rehab when available as pt expressed concern for return home to [place].     Follow Up Recommendations  CIR;Supervision/Assistance - 24 hour(pending pt agreeable to staying in [place] vs return to [place])    Equipment Recommendations  3 in 1 bedside commode    Recommendations for Other Services      Precautions / Restrictions Precautions Precautions: Fall Precaution Comments: deconditioned, monitor O2 sats  Restrictions Weight Bearing Restrictions: No       Mobility Bed Mobility Overal bed mobility: (Pt sitting up in recliner chair upon OT arrival)                Transfers Overall transfer level: Needs assistance Equipment used: Rolling walker (2 wheeled) Transfers: Sit to/from [employer] Sit to Stand: Min assist Stand pivot transfers: Min guard       General transfer comment: VC's for safety and sequencing with RW as pt is somewhat implusive during session rushing to sit in chair or on commode especially as SOB increased.     Balance Overall balance assessment: Needs assistance Sitting-balance support: Feet supported;Single extremity supported;No upper extremity supported Sitting balance-Leahy Scale: Fair Sitting  balance - Comments: able to sit without bil UE support    Standing balance support: Bilateral upper extremity supported Standing balance-Leahy Scale: Poor Standing balance comment: requires UE support on RW                           ADL either performed or assessed with clinical judgement   ADL Overall ADL's : Needs assistance/impaired     Grooming: Set up;Min guard;Sitting   Upper Body Bathing: Min guard;Sitting               Toilet Transfer: Minimal assistance;Ambulation;RW;Grab bars Toilet Transfer Details (indicate cue type and reason): Pt ambulated from recliner chair to toilet/bathroom w/ x2 rest breaks secondary to deconditioning and SOB on 1.5 LO2. O2 was therefore increased to 2L O2 and then 4L O2 via nasal cannula during functional mobility. After back in recliner, pt O2 returned to 90's % and O2 was placed back on 1.5 L via nasal cannula per pt request. Pt was educated throughout session on pursed lip breathing and safety/sequencing with RW. He is somewhat implusive at tmes and benefits from verbal and tactile cues for safety. As he becomes SOB, he rushes more through activities. Contraindications to this (rushing/pushing through ADL's) were discussed and he verbalized understanding. Toileting- Clothing Manipulation and Hygiene: Sit to/from stand;Minimal assistance       Functional mobility during ADLs: Min guard;Rolling walker;Cueing for safety;Cueing for sequencing General ADL Comments: Pt continues to be limited by deconditioned status. He tends to rush/be implusive through ADL's which impacts his SOB and fatigue/lethergy. Lowest SAT noted at 84%, quickly returned to  90's with increased O2 via nasal cannula during functional mobility from 1.5L O2 to 2L then 4L O2. After Sats in 90's and sitting up in recliner, pt requested return to 1.5 L O2 via nasal cannula. Pt was educated in role of OT, goals, gaining strength back etc. He will benefit from Energy  conservation techniques in the future and 3:1 over toilet for decreased exertion when getting up/down from commode. Reviewed pursed lip breathing ex's however, pt required consistent (Mod vc's and demonstration) reminders throughout.     Vision Baseline Vision/History: Wears glasses Patient Visual Report: No change from baseline     Perception     Praxis      Cognition Arousal/Alertness: Awake/alert Behavior During Therapy: Anxious;WFL for tasks assessed/performed Overall Cognitive Status: Impaired/Different from baseline Area of Impairment: Attention;Following commands;Awareness;Memory                     Memory: Decreased short-term memory Following Commands: Follows multi-step commands inconsistently Safety/Judgement: Decreased awareness of safety;Decreased awareness of deficits Awareness: Emergent Problem Solving: Requires verbal cues;Requires tactile cues          Exercises     Shoulder Instructions       General Comments Pt initially on 1.5L O2 via [place] w/ Sat in low to mid 90's. During functional activity, O2 was increased to 2L and then 4 L O2via [place] with drop to 84% (increased in less than 1 min back to 90% when increased to 4L). Pt requires consistent verbal & multimodal cues for follow through with pursed lip breathing techniques and safety as he is somewhat implusive as SOB increases. He required 2 rest breaks with functional transfers to bathroom today. Once pt rebounded, O2 was returned to 1.5L and he was resting in recliner.     Pertinent Vitals/ Pain       Pain Assessment: No/denies pain Pain Score: 0-No pain  Home Living                                          Prior Functioning/Environment              Frequency  Min 2X/week        Progress Toward Goals  OT Goals(current goals can now be found in the care plan section)  Progress towards OT goals: Progressing toward goals  Acute Rehab OT Goals Patient Stated Goal:  Go home to [facility] Discharge plan remains appropriate    Co-evaluation                 AM-PAC PT "6 Clicks" Daily Activity     Outcome Measure   Help from another person eating meals?: None Help from another person taking care of personal grooming?: A Little Help from another person toileting, which includes using toliet, bedpan, or urinal?: A Lot Help from another person bathing (including washing, rinsing, drying)?: A Lot Help from another person to put on and taking off regular upper body clothing?: A Little Help from another person to put on and taking off regular lower body clothing?: A Lot 6 Click Score: 16    End of Session Equipment Utilized During Treatment: Gait belt;Rolling walker;Oxygen  OT Visit Diagnosis: Muscle weakness (generalized) (M62.81)   Activity Tolerance Patient tolerated treatment well;Patient limited by fatigue   Patient Left in chair;with call bell/phone within reach;with nursing/sitter in room   Nurse  Communication          Time: 9147-8295 OT Time Calculation (min): 37 min  Charges: OT General Charges $OT Visit: 1 Visit OT Treatments $Self Care/Home Management : 23-37 mins   [place], Amy Beth Dixon, OTR/L 03/02/2018, 11:57 AM

## 2018-03-03 LAB — BASIC METABOLIC PANEL
ANION GAP: 6 (ref 5–15)
BUN: 30 mg/dL — ABNORMAL HIGH (ref 8–23)
CHLORIDE: 103 mmol/L (ref 98–111)
CO2: 28 mmol/L (ref 22–32)
Calcium: 7.7 mg/dL — ABNORMAL LOW (ref 8.9–10.3)
Creatinine, Ser: 0.91 mg/dL (ref 0.61–1.24)
GFR calc non Af Amer: >60 mL/min (ref >60)
Glucose, Bld: 97 mg/dL (ref 70–99)
POTASSIUM: 3.6 mmol/L (ref 3.5–5.1)
SODIUM: 137 mmol/L (ref 135–145)

## 2018-03-03 LAB — MAGNESIUM: MAGNESIUM: 2 mg/dL (ref 1.7–2.4)

## 2018-03-03 MED ORDER — FUROSEMIDE 10 MG/ML IJ SOLN
60.0000 mg | Freq: Once | INTRAMUSCULAR | Status: AC
  Administered 2018-03-03: 60 mg via INTRAVENOUS
  Filled 2018-03-03 (×2): qty 6

## 2018-03-03 MED ORDER — POTASSIUM CHLORIDE CRYS ER 20 MEQ PO TBCR
40.0000 meq | EXTENDED_RELEASE_TABLET | Freq: Once | ORAL | Status: AC
  Administered 2018-03-03: 40 meq via ORAL
  Filled 2018-03-03: qty 2

## 2018-03-03 NOTE — Progress Notes (Signed)
Physical Therapy Treatment Patient Details Name: Curtis Preston MRN: [id number] DOB: 11-06-1950 Today's Date: 03/03/2018    History of Present Illness 66 yo admitted with SOB, multifocal PNA and sepsis. PMhx: myasthenia gravis, CoPD, PE, CAD Intubated 9/13 for hypoxia, Extubated 9/23. Remained in ICU until 9/26 for delrium     PT Comments    Patient seen for mobility progression. Pt is making progress toward PT goals and in good spirits today. Pt requires min guard/min A for OOB mobility (+2 for chair follow when progressing gait).  Pt continues to experience 3/4 DOE and with SpO2 desat to 78% on 3L O2 via [place] with mobility. Continue to progress as tolerated.   Follow Up Recommendations  CIR     Equipment Recommendations  Other (comment)(TBD at next venue)    Recommendations for Other Services Rehab consult     Precautions / Restrictions Precautions Precautions: Fall Precaution Comments: deconditioned, monitor O2 sats     Mobility  Bed Mobility               General bed mobility comments: pt OOB in chair upon arrival  Transfers Overall transfer level: Needs assistance Equipment used: Rolling walker (2 wheeled) Transfers: Sit to/from Stand Sit to Stand: Min guard         General transfer comment: min guard for safety  Ambulation/Gait Ambulation/Gait assistance: Min guard;Min assist;+2 safety/equipment(chair follow) Gait Distance (Feet): (145ft total with 3 seated rest breaks ) Assistive device: Rolling walker (2 wheeled) Gait Pattern/deviations: Step-through pattern;Decreased stride length;Narrow base of support Gait velocity: decreased   General Gait Details: cues for posture, breathing technique and increased BOS/stride length   Stairs             Wheelchair Mobility    Modified Rankin (Stroke Patients Only)       Balance Overall balance assessment: Needs assistance Sitting-balance support: Feet supported;No upper extremity  supported Sitting balance-Leahy Scale: Fair     Standing balance support: Bilateral upper extremity supported Standing balance-Leahy Scale: Poor                              Cognition Arousal/Alertness: Awake/alert Behavior During Therapy: WFL for tasks assessed/performed Overall Cognitive Status: Within Functional Limits for tasks assessed                                        Exercises      General Comments General comments (skin integrity, edema, etc.): SpO2 desat to 78% on 3L O2 via [place] with mobility and 3/4 DOE       Pertinent Vitals/Pain Pain Assessment: No/denies pain    Home Living   Living Arrangements: (lives with fiance, Debby) Available Help at Discharge: Family;Available 24 hours/day           Additional Comments: pt and fiance live together    Prior Function            PT Goals (current goals can now be found in the care plan section) Progress towards PT goals: Progressing toward goals    Frequency    Min 3X/week      PT Plan Current plan remains appropriate    Co-evaluation              AM-PAC PT "6 Clicks" Daily Activity  Outcome Measure  Difficulty turning over in bed (including adjusting  bedclothes, sheets and blankets)?: Unable Difficulty moving from lying on back to sitting on the side of the bed? : Unable Difficulty sitting down on and standing up from a chair with arms (e.g., wheelchair, bedside commode, etc,.)?: Unable Help needed moving to and from a bed to chair (including a wheelchair)?: A Little Help needed walking in hospital room?: A Little Help needed climbing 3-5 steps with a railing? : Total 6 Click Score: 10    End of Session Equipment Utilized During Treatment: Gait belt;Oxygen Activity Tolerance: Treatment limited secondary to medical complications (Comment)(SpO2 desat) Patient left: in chair;with call bell/phone within reach;with chair alarm set Nurse Communication: Mobility  status PT Visit Diagnosis: Unsteadiness on feet (R26.81);History of falling (Z91.81);Muscle weakness (generalized) (M62.81);Other abnormalities of gait and mobility (R26.89);Difficulty in walking, not elsewhere classified (R26.2)     Time: 1610-9604 PT Time Calculation (min) (ACUTE ONLY): 30 min  Charges:  $Gait Training: 8-22 mins $Therapeutic Activity: 8-22 mins                     Erline Levine, PTA Acute Rehabilitation Services Pager: [phone] Office: [phone]     Carolynne Edouard 03/03/2018, 4:46 PM

## 2018-03-03 NOTE — Progress Notes (Signed)
PROGRESS NOTE        PATIENT DETAILS Name: Curtis Preston Age: 67 y.o. Sex: male Date of Birth: 10/22/1950 Admit Date: 02/06/2018 Admitting Physician Ivor Costa, MD PCP:No primary care provider on file.  Brief Narrative: Patient is a 67 y.o. male with history of myasthenia gravis on Imuran, PE on Xarelto, COPD not on home O2 who originally is from [place] and is here in town for his brother-in-law's funeral, he developed worsening shortness of breath and cough (started since August but worsened acutely)-he subsequently presented to the ED and was found to have pneumonia and started on empiric antibiotics.  Ultimately patient worsened and required intubation on 9/13.  While in the ICU, patient underwent bronchoscopy with negative BAL.  Upon clinical stability he was extubated and transferred to the trial hospitalist service on 9/26.  Hospital course has been complicated by ICU delirium, severe deconditioning and ongoing hypoxia but gradually improving.  See below for further details  Significant events: 9/13 - intubated 9/13 - bronch with BAL 9/19 - repeated bronch with BAL 9/22 - extubated, remained in the ICU due to delirium   Subjective: Patient in bed, appears comfortable, denies any headache, no fever, no chest pain or pressure, no shortness of breath , no abdominal pain. No focal weakness.   Assessment/Plan:  Acute hypoxic respiratory failure secondary to multifocal pneumonia and now evidence of acute on chronic diastolic heart failure EF 55 to 60%: Patient had strep pneumonia bacteremia under the care of ICU team and has finished antibiotics for his infectious process.   He was still short of breath and was requiring 5 L nasal cannula oxygen until few days ago, likely developed acute on chronic diastolic CHF, echo was obtained and reviewed, he is down to 1 to 2 L nasal cannula oxygen and overall feels better but still short of breath with activity and still  has some rails, will repeat IV Lasix on 03/03/2018.  Continue pulmonary toiletry, encouraged to increase activity.  Repeat PT eval today.  If stable discharge tomorrow either to SNF or home health PT.  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  Slowly taper off steroids.  Leukocytosis: Likely secondary to steroids-slowly improving-completed a course of antimicrobial therapy.  Follow.  Myasthenia gravis: Seen by neurology-not felt to have myasthenic crisis-Imuran remains on hold.  Continue Mestinon.  COPD: Not Wheezing-moving air well-continue bronchodilators  Hypothyroidism: Continue Synthroid  History of pulmonary embolism: Continue rivaroxaban.  ICU delirium: Resolved  Severe deconditioning: Continue PT, now agreeable to placement, continue looking for SNF/CIR.  Eager to be discharged home to [place].  Plan is to drive back which is clearly not safe.  Patient counseled.    DVT Prophylaxis: Full dose anticoagulation with Xarelto  Code Status: Full code   Family Communication: None at bedside  Disposition Plan: Likely discharge to SNF/CIR on 03/04/2018  Antimicrobial agents: Anti-infectives (From admission, onward)   Start     Dose/Rate Route Frequency Ordered Stop   02/14/18 1400  imipenem-cilastatin (PRIMAXIN) 750 mg in sodium chloride 0.9 % 100 mL IVPB     750 mg 200 mL/hr over 30 Minutes Intravenous Every 8 hours 02/14/18 1031 02/17/18 2322   02/13/18 1330  sulfamethoxazole-trimethoprim (BACTRIM) 370 mg in dextrose 5 % 500 mL IVPB     370 mg 348.8 mL/hr over 90  Minutes Intravenous Every 8 hours 02/13/18 1238 02/17/18 2332   02/09/18 1600  ciprofloxacin (CIPRO) IVPB 400 mg  Status:  Discontinued     400 mg 200 mL/hr over 60 Minutes Intravenous Every 12 hours 02/09/18 1516 02/13/18 1140   02/09/18 1200  ciprofloxacin (CIPRO) tablet 500 mg  Status:  Discontinued     500 mg Oral 2 times daily 02/09/18 1159 02/09/18 1211    02/08/18 0100  vancomycin (VANCOCIN) IVPB 1000 mg/200 mL premix  Status:  Discontinued     1,000 mg 200 mL/hr over 60 Minutes Intravenous Every 24 hours 02/07/18 0107 02/09/18 1159   02/08/18 0000  azithromycin (ZITHROMAX) 250 mg in dextrose 5 % 125 mL IVPB  Status:  Discontinued     250 mg 125 mL/hr over 60 Minutes Intravenous Every 24 hours 02/07/18 0032 02/09/18 1159   02/07/18 0600  ceFEPIme (MAXIPIME) 1 g in sodium chloride 0.9 % 100 mL IVPB  Status:  Discontinued     1 g 200 mL/hr over 30 Minutes Intravenous Every 8 hours 02/07/18 0032 02/09/18 1159   02/07/18 0045  ceFEPIme (MAXIPIME) 1 g in sodium chloride 0.9 % 100 mL IVPB  Status:  Discontinued     1 g 200 mL/hr over 30 Minutes Intravenous Every 8 hours 02/07/18 0031 02/07/18 0032   02/07/18 0045  vancomycin (VANCOCIN) 1,500 mg in sodium chloride 0.9 % 500 mL IVPB     1,500 mg 250 mL/hr over 120 Minutes Intravenous  Once 02/07/18 0042 02/07/18 0559   02/06/18 2330  cefTRIAXone (ROCEPHIN) 1 g in sodium chloride 0.9 % 100 mL IVPB     1 g 200 mL/hr over 30 Minutes Intravenous  Once 02/06/18 2326 02/07/18 0048   02/06/18 2330  azithromycin (ZITHROMAX) 500 mg in sodium chloride 0.9 % 250 mL IVPB     500 mg 250 mL/hr over 60 Minutes Intravenous  Once 02/06/18 2326 02/07/18 0201      Procedures: 9/13 -Intubated for hypoxia.  9/13 Bronch BAL 9/19 Repeat bronch, BAL TTE - EF 55%, Normal LV systolic function; mild diastolic dysfunction.  CONSULTS:  pulmonary/intensive care, neurology and CTVS  Time spent: 25- minutes-Greater than 50% of this time was spent in counseling, explanation of diagnosis, planning of further management, and coordination of care.  MEDICATIONS: Scheduled Meds: . arformoterol  15 mcg Nebulization BID  . aspirin  81 mg Oral Daily  . atorvastatin  40 mg Oral QHS  . baclofen  10 mg Oral TID  . calcium-vitamin D  1 tablet Oral Daily  . Chlorhexidine Gluconate Cloth  6 each Topical Daily  . feeding  supplement (ENSURE ENLIVE)  237 mL Oral TID BM  . furosemide  60 mg Intravenous Once  . levothyroxine  175 mcg Oral QAC breakfast  . mouth rinse  15 mL Mouth Rinse BID  . mirtazapine  15 mg Oral QHS  . montelukast  10 mg Oral QHS  . pantoprazole  40 mg Oral Q1200  . potassium chloride  40 mEq Oral Once  . predniSONE  40 mg Oral Q breakfast  . pregabalin  50 mg Oral QHS  . pyridostigmine  30 mg Oral QID  . rivaroxaban  20 mg Oral Q supper  . senna-docusate  1 tablet Oral BID  . tiotropium  18 mcg Inhalation Daily  . vitamin B-12  1,000 mcg Oral Daily   Continuous Infusions: . sodium chloride 10 mL/hr at 02/23/18 0600   PRN Meds:.sodium chloride, acetaminophen (TYLENOL) oral liquid 160  mg/5 mL, albuterol, benzonatate, polyethylene glycol   PHYSICAL EXAM: Vital signs: Vitals:   03/03/18 0554 03/03/18 0838 03/03/18 0840 03/03/18 0843  BP: (!) 97/57 (!) 120/56    Pulse: 78 80    Resp: 20     Temp: 98.5 F (36.9 C) (!) 97.5 F (36.4 C)    TempSrc: Oral Oral    SpO2: 95% 96% 97% 97%  Weight: 72.3 kg     Height:       Filed Weights   03/01/18 0600 03/02/18 0542 03/03/18 0554  Weight: 69.8 kg 70.2 kg 72.3 kg   Body mass index is 21.03 kg/m.   Exam  Awake Alert, Oriented X 3, No new F.N deficits, Normal affect [place].AT,PERRAL Supple Neck,No JVD, No cervical lymphadenopathy appriciated.  Symmetrical Chest wall movement, Good air movement bilaterally, ++ rales RRR,No Gallops, Rubs or new Murmurs, No Parasternal Heave +ve B.Sounds, Abd Soft, No tenderness, No organomegaly appriciated, No rebound - guarding or rigidity. No Cyanosis, Clubbing or edema, No new Rash or bruise  I have personally reviewed following labs and imaging studies  LABORATORY DATA: CBC: Recent Labs  Lab 02/25/18 0734 02/26/18 0528 02/27/18 0331 02/28/18 0757  WBC 12.9* 16.0* 15.9* 14.4*  NEUTROABS 8.4*  --  10.7*  --   HGB 10.3* 11.1* 10.9* 11.4*  HCT 31.8* 34.1* 34.1* 35.7*  MCV 97.8 98.0 97.2  98.3  PLT 562* 578* 560* 545*    Basic Metabolic Panel: Recent Labs  Lab 02/25/18 0734 02/26/18 0528 02/27/18 0331 02/28/18 0757 03/01/18 0426 03/03/18 0410  NA 140 140 139 139 140 137  K 3.6 3.4* 3.6 3.4* 4.0 3.6  CL 104 111 103 104 106 103  CO2 _0 GLUCOSE 88 85 99 87 98 97  BUN _1 30* 30*  CREATININE 0.72 0.64 0.73 0.70 0.81 0.91  CALCIUM 7.7* 7.2* 8.1* 8.0* 7.7* 7.7*  MG 1.9 1.7 1.9  --  1.8 2.0  PHOS 3.3 2.9  --   --   --   --     GFR: Estimated Creatinine Clearance: 78.3 mL/min (by C-G formula based on SCr of 0.91 mg/dL).  Liver Function Tests: No results for input(s): AST, ALT, ALKPHOS, BILITOT, PROT, ALBUMIN in the last 168 hours. No results for input(s): LIPASE, AMYLASE in the last 168 hours. No results for input(s): AMMONIA in the last 168 hours.  Coagulation Profile: No results for input(s): INR, PROTIME in the last 168 hours.  Cardiac Enzymes: No results for input(s): CKTOTAL, CKMB, CKMBINDEX, TROPONINI in the last 168 hours.  BNP (last 3 results) No results for input(s): PROBNP in the last 8760 hours.  HbA1C: No results for input(s): HGBA1C in the last 72 hours.  CBG: Recent Labs  Lab 02/25/18 0445 02/25/18 0803 02/25/18 1103 02/25/18 1511 02/25/18 1926  GLUCAP 81 71 83 142* 97    Lipid Profile: No results for input(s): CHOL, HDL, LDLCALC, TRIG, CHOLHDL, LDLDIRECT in the last 72 hours.  Thyroid Function Tests: No results for input(s): TSH, T4TOTAL, FREET4, T3FREE, THYROIDAB in the last 72 hours.  Anemia Panel: No results for input(s): VITAMINB12, FOLATE, FERRITIN, TIBC, IRON, RETICCTPCT in the last 72 hours.  Urine analysis:    Component Value Date/Time   COLORURINE YELLOW 02/20/2018 0508   APPEARANCEUR CLEAR 02/20/2018 0508   LABSPEC 1.021 02/20/2018 0508   PHURINE 5.0 02/20/2018 0508   GLUCOSEU NEGATIVE 02/20/2018 0508   HGBUR MODERATE (A) 02/20/2018 0508   BILIRUBINUR NEGATIVE  02/20/2018 0508    KETONESUR NEGATIVE 02/20/2018 0508   PROTEINUR NEGATIVE 02/20/2018 0508   NITRITE NEGATIVE 02/20/2018 0508   LEUKOCYTESUR NEGATIVE 02/20/2018 0508    Sepsis Labs: Lactic Acid, Venous    Component Value Date/Time   LATICACIDVEN 0.6 02/20/2018 1610    MICROBIOLOGY: No results found for this or any previous visit (from the past 240 hour(s)).  RADIOLOGY STUDIES/RESULTS: Dg Chest 2 View  Result Date: 02/28/2018 CLINICAL DATA:  Recent pneumonia EXAM: CHEST - 2 VIEW COMPARISON:  02/26/2018 FINDINGS: Normal heart size. Left subclavian Port-A-Cath is stable. Patchy airspace opacities throughout both lungs are stable. No pneumothorax. No pleural effusion. Mid-level thoracic compression deformity is stable compare with a recent CT. IMPRESSION: Stable bilateral airspace opacities. Electronically Signed   By: Marybelle Killings M.D.   On: 02/28/2018 15:13   Dg Chest 2 View  Result Date: 02/08/2018 CLINICAL DATA:  Chest pain and shortness of breath EXAM: CHEST - 2 VIEW COMPARISON:  February 06, 2018 FINDINGS: The left central line is stable. There appears to be a small bulla in the left apex. No pneumothorax. Bilateral pulmonary opacities most prominent in the mid lower lungs. No other interval changes or acute abnormalities. IMPRESSION: 1. Stable left Port-A-Cath. 2. Bilateral pulmonary opacities may represent worsening edema versus worsening bilateral pneumonia. Recommend clinical correlation and follow-up to resolution. Electronically Signed   By: Dorise Bullion III M.D   On: 02/08/2018 07:08   Dg Chest 2 View  Result Date: 02/06/2018 CLINICAL DATA:  Shortness of breath cough and congestion for 3 days. EXAM: CHEST - 2 VIEW COMPARISON:  None. FINDINGS: Left subclavian injectable port terminates at the expected location of the cavoatrial junction. Cardiomediastinal silhouette is normal. Mediastinal contours appear intact. There is no evidence of pleural effusion or pneumothorax. Patchy interstitial and  alveolar opacities in bilateral lungs, with particular prominence in the right upper lobe, right middle lobe and left lower lobe. Possible pulmonary nodule in the right mid lung field. Osseous structures are without acute abnormality. Soft tissues are grossly normal. IMPRESSION: Patchy interstitial and alveolar opacities in bilateral lungs. Findings may represent multifocal pneumonia or developing asymmetric pulmonary edema. Possible pulmonary nodule in the right mid lung field. Electronically Signed   By: Fidela Salisbury M.D.   On: 02/06/2018 21:12   Dg Abd 1 View  Result Date: 02/15/2018 CLINICAL DATA:  Nausea and vomiting EXAM: ABDOMEN - 1 VIEW COMPARISON:  None. FINDINGS: Scattered large and small bowel gas is noted. Cecum is mildly prominent at almost 12 cm. No free air is seen. Nasogastric catheter is noted within the stomach. No bony abnormality is seen. No mass lesion is noted. IMPRESSION: Gas dilated cecum to 12 cm. Nasogastric catheter within the stomach. Electronically Signed   By: Inez Catalina M.D.   On: 02/15/2018 09:46   Ct Chest Wo Contrast  Result Date: 02/18/2018 CLINICAL DATA:  Increased secretions, ventilator dependent. EXAM: CT CHEST WITHOUT CONTRAST TECHNIQUE: Multidetector CT imaging of the chest was performed following the standard protocol without IV contrast. COMPARISON:  Radiograph of February 16, 2018. CT scan of February 08, 2018. FINDINGS: Cardiovascular: Atherosclerosis of thoracic aorta is noted without aneurysm formation. Coronary artery calcifications are noted. Normal cardiac size. No pericardial effusion. Mediastinum/Nodes: Feeding tube is seen passing through esophagus into stomach. Significantly increased mediastinal adenopathy is noted compared to prior exam, most likely inflammatory in etiology; this includes 14 mm precarinal lymph node. 11 mm right paratracheal lymph node is noted and and increased compared to  prior exam. 12 mm left hilar lymph node is noted which  is enlarged compared to prior exam. Tracheostomy tube is well positioned. Lungs/Pleura: No pneumothorax is noted. Emphysematous disease is again noted. Significantly increased lung opacities are noted bilaterally and diffusely most consistent with worsening pneumonia. No significant pleural effusion is noted. Upper Abdomen: No acute abnormality. Musculoskeletal: No chest wall mass or suspicious bone lesions identified. IMPRESSION: Significantly increased airspace opacities are noted throughout both lungs consistent with severely worsening pneumonia. Increased mediastinal adenopathy is also noted most likely is inflammatory in etiology. Coronary artery calcifications are noted. Aortic Atherosclerosis (ICD10-I70.0) and Emphysema (ICD10-J43.9). Electronically Signed   By: Marijo Conception, M.D.   On: 02/18/2018 16:50   Ct Chest Wo Contrast  Result Date: 02/08/2018 CLINICAL DATA:  Progressive hypoxemia.  Pneumonia. EXAM: CT CHEST WITHOUT CONTRAST TECHNIQUE: Multidetector CT imaging of the chest was performed following the standard protocol without IV contrast. COMPARISON:  Chest x-ray 02/08/2018 FINDINGS: Cardiovascular: Heart size is normal. Atherosclerotic calcifications are present coronary arteries. Atherosclerotic changes are present at the aortic arch. Pulmonary arteries mildly prominent bilaterally. No significant pericardial effusion is present. Mediastinum/Nodes: No significant mediastinal, axillary, or hilar adenopathy is present. The esophagus is within normal limits. The thoracic inlet is unremarkable. Lungs/Pleura: Extensive confluent ground-glass attenuation is present throughout the lower lobes, the lingula, and right middle lobe. Consolidation is most evident in the superior segment of the left lower lobe. Extensive centrilobular emphysematous changes are present. No significant pleural effusion is present. Upper Abdomen: Unremarkable. Musculoskeletal: A remote superior endplate fractures present at  T7. Vertebral body heights alignment are otherwise maintained. No focal lytic or blastic lesions are present. Ribs are within normal limits. IMPRESSION: 1. Extensive confluent ground-glass attenuation throughout both lungs with consolidation more prevalent in the superior segment of the left lower lobe. The findings are nonspecific, but most concerning for multi lobar pneumonia. Edema or possibly could have a similar appearance. This is to extensive for atelectasis. Consolidation in the superior segment in the left lower lobe suggests infection. 2.  Aortic Atherosclerosis (ICD10-I70.0). 3.  Emphysema (ICD10-J43.9). 4. Coronary artery disease Electronically Signed   By: San Morelle M.D.   On: 02/08/2018 11:47   Dg Chest Port 1 View  Result Date: 02/26/2018 CLINICAL DATA:  Acute respiratory failure EXAM: PORTABLE CHEST 1 VIEW COMPARISON:  10/25/2017 FINDINGS: Left subclavian Port-A-Cath is stable. The heart is borderline enlarged. Patchy bilateral airspace disease right greater than left is stable. No pneumothorax. No pleural effusion. IMPRESSION: Stable bilateral airspace disease right greater than left. Electronically Signed   By: Marybelle Killings M.D.   On: 02/26/2018 09:26   Dg Chest Port 1 View  Result Date: 02/25/2018 CLINICAL DATA:  Respiratory failure, shortness of breath. EXAM: PORTABLE CHEST 1 VIEW COMPARISON:  Radiograph of February 22, 2018. FINDINGS: Stable cardiomediastinal silhouette. Endotracheal and nasogastric tubes have been removed. Left subclavian Port-A-Cath is unchanged in position. No pneumothorax is noted. Stable bilateral lung opacities are noted concerning for multifocal pneumonia, most prominently seen in left lung base. Mild left pleural effusion may be present. Bony thorax is unremarkable. IMPRESSION: Endotracheal and nasogastric tubes have been removed. Stable bilateral lung opacities are noted consistent with multifocal pneumonia. Mild left pleural effusion is noted.  Electronically Signed   By: Marijo Conception, M.D.   On: 02/25/2018 10:29   Dg Chest Port 1 View  Result Date: 02/22/2018 CLINICAL DATA:  Respiratory distress. Follow-up exam. Interstitial lung disease EXAM: PORTABLE CHEST 1 VIEW COMPARISON:  02/19/2018 and older exams. FINDINGS: Hazy, patchy areas of airspace lung opacity are superimposed on chronic interstitial thickening, without significant change compared to the most recent prior exam. There are no new lung abnormalities. No convincing pleural effusion.  No pneumothorax. Right subclavian central line has been removed. Left subclavian central venous line, endotracheal tube and enteric tube are stable. IMPRESSION: 1. No change in lung aeration since the most recent prior exam. Airspace opacities consistent with multifocal pneumonia are superimposed on chronic interstitial lung disease. 2. Remaining support apparatus is stable and well positioned. Electronically Signed   By: Lajean Manes M.D.   On: 02/22/2018 08:00   Dg Chest Port 1 View  Result Date: 02/19/2018 CLINICAL DATA:  Interstitial lung disease, evaluate endotracheal tube placement EXAM: PORTABLE CHEST 1 VIEW COMPARISON:  CT chest of 02/18/2018 and chest x-ray of 02/16/2017 FINDINGS: The tip of the endotracheal tube is approximately 5.8 cm above the carina. Airspace disease noted recently has improved somewhat in the interval. No pleural effusion is seen. Right central venous line tip overlies the upper SVC and a left central venous line tip overlies the lower SVC. The heart is within upper limits of normal and stable. Feeding tube extends below the hemidiaphragm. IMPRESSION: 1. Tip of endotracheal tube 5.8 cm above the carina. 2. Some improvement in patchy airspace disease bilaterally. Electronically Signed   By: Ivar Drape M.D.   On: 02/19/2018 14:09   Dg Chest Port 1 View  Result Date: 02/16/2018 CLINICAL DATA:  Acute respiratory failure with hypoxia EXAM: PORTABLE CHEST 1 VIEW COMPARISON:   Yesterday FINDINGS: Endotracheal tube tip between the clavicular heads and carina. An orogastric tube at least reaches the pylorus. Bilateral central line with tip at the SVC. Unchanged asymmetric airspace disease, presumed infection in this immunocompromised patient. IMPRESSION: 1. Stable hardware positioning. The orogastric tube may intubate the duodenum. 2. Unchanged airspace disease. Electronically Signed   By: Monte Fantasia M.D.   On: 02/16/2018 07:12   Dg Chest Port 1 View  Result Date: 02/15/2018 CLINICAL DATA:  Intubation. History of COPD. EXAM: PORTABLE CHEST 1 VIEW COMPARISON:  02/14/2018 FINDINGS: Endotracheal tube with tip measuring 7 cm above the carina. Enteric tube tip is off the field of view but below the left hemidiaphragm. Bilateral central venous catheters with tips over the distal SVC region. No pneumothorax. Heart size is normal. There is patchy airspace disease throughout both lungs, demonstrating progression since previous study. No blunting of costophrenic angles. IMPRESSION: Appliances appear to be in satisfactory position. Diffuse bilateral airspace disease in the lungs, demonstrating progression since previous study. Electronically Signed   By: Lucienne Capers M.D.   On: 02/15/2018 22:25   Dg Chest Port 1 View  Result Date: 02/14/2018 CLINICAL DATA:  History of endotracheal tube EXAM: PORTABLE CHEST 1 VIEW COMPARISON:  Yesterday FINDINGS: Endotracheal tube tip between the clavicular heads and carina. An orogastric tube and side-port reaches the stomach. Gas is seen within the stomach although decreased from prior. Bilateral subclavian line with tip at the SVC. Diffuse interstitial and airspace opacity, stable. Normal heart size and stable mediastinal contours. No effusion or pneumothorax. IMPRESSION: 1. Unremarkable hardware positioning. 2. Persistent airspace disease in the setting of immunocompromise status post recent BAL. Electronically Signed   By: Monte Fantasia M.D.    On: 02/14/2018 08:12   Dg Chest Port 1 View  Result Date: 02/13/2018 CLINICAL DATA:  History of ETT EXAM: PORTABLE CHEST 1 VIEW COMPARISON:  02/13/2018, 02/12/2018, 02/10/2018, CT chest 02/08/2017  FINDINGS: Endotracheal tube tip is about 4.5 cm superior to the carina. Esophageal tube tip is below the diaphragm but non included. Bilateral central venous catheter tips over the SVC. Stable cardiomediastinal silhouette. Interval worsening of interstitial and ground-glass opacity within the bilateral lungs. No pneumothorax. IMPRESSION: 1. Endotracheal tube tip about 4.5 cm superior to the carina. 2. Slight worsening of diffuse bilateral interstitial and ground-glass edema or infiltrates. Electronically Signed   By: Donavan Foil M.D.   On: 02/13/2018 23:39   Dg Chest Port 1 View  Result Date: 02/13/2018 CLINICAL DATA:  Endotracheal tube placement. EXAM: PORTABLE CHEST 1 VIEW COMPARISON:  02/12/2018 FINDINGS: Endotracheal tube has tip 7.3 cm above the carina. Left subclavian central venous catheter is unchanged. Interval placement of right subclavian central venous catheter with tip over the SVC. Lungs are adequately inflated demonstrate continued bilateral hazy interstitial process right worse than left with slight interval improved aeration. No effusion. No pneumothorax. Cardiomediastinal silhouette and remainder the exam is unchanged. IMPRESSION: Persistent bilateral hazy interstitial process with slight improved aeration. Tubes and lines as described.  No pneumothorax. Electronically Signed   By: Marin Olp M.D.   On: 02/13/2018 15:13   Dg Chest Port 1 View  Result Date: 02/12/2018 CLINICAL DATA:  Hypoxia. EXAM: PORTABLE CHEST 1 VIEW COMPARISON:  Prior exams over the last week with radiographs. Chest CT 02/08/2018. No remote exams available. FINDINGS: Left central line with tip in the SVC. Diffuse interstitial/ground-glass opacities with progression in the right suprahilar lung. Unchanged heart size and  mediastinal contours. Again seen apical emphysema. No pneumothorax or pleural effusion. IMPRESSION: Diffuse interstitial/ground-glass opacities with progression in the right suprahilar lung. Differential considerations include infectious or inflammatory etiologies or pulmonary edema. Given history of Sjogren's syndrome, interstitial lung disease is also considered. No remote exams available for comparison. Electronically Signed   By: Keith Rake M.D.   On: 02/12/2018 21:19   Dg Chest Port 1 View  Result Date: 02/10/2018 CLINICAL DATA:  Shortness of breath, respiratory failure EXAM: PORTABLE CHEST 1 VIEW COMPARISON:  02/08/2018 FINDINGS: Diffuse interstitial and alveolar opacities throughout the lungs. Heart is borderline in size. No visible effusions or acute bony abnormality. Minimal change since prior study. IMPRESSION: Continued bilateral diffuse pulmonary opacities which could reflect edema or pneumonia. Electronically Signed   By: Rolm Baptise M.D.   On: 02/10/2018 09:25   Dg Abd Portable 1v  Result Date: 02/18/2018 CLINICAL DATA:  Feeding tube placement. EXAM: PORTABLE ABDOMEN - 1 VIEW COMPARISON:  Radiograph of February 16, 2018. FINDINGS: The bowel gas pattern is normal. Distal tip of feeding tube appears to be in expected position of fourth portion of duodenum near the ligament of Treitz. No radio-opaque calculi or other significant radiographic abnormality are seen. IMPRESSION: Distal tip of feeding tube has been advanced into expected position of fourth portion of duodenum near ligament of Treitz. Electronically Signed   By: Marijo Conception, M.D.   On: 02/18/2018 10:56   Dg Abd Portable 1v  Result Date: 02/16/2018 CLINICAL DATA:  Check feeding catheter placement EXAM: PORTABLE ABDOMEN - 1 VIEW COMPARISON:  02/15/2018 FINDINGS: Nasogastric catheter has been removed. A feeding catheter is now seen and appears to extend beyond the pylorus into the first portion of the duodenum. Persistent  cecal prominence is noted. No other focal abnormality is seen. IMPRESSION: Feeding catheter which appears to lie within the first portion of the duodenum. Electronically Signed   By: Inez Catalina M.D.   On: 02/16/2018 16:48  Dg Swallowing Func-speech Pathology  Result Date: 02/23/2018 Objective Swallowing Evaluation: Type of Study: MBS-Modified Barium Swallow Study  Patient Details Name: Leray Garverick MRN: [id number] Date of Birth: 09-08-1950 Today's Date: 02/23/2018 Time: SLP Start Time (ACUTE ONLY): 1033 -SLP Stop Time (ACUTE ONLY): 1042 SLP Time Calculation (min) (ACUTE ONLY): 9 min Past Medical History: Past Medical History: Diagnosis Date . Bronchiectasis ([place])  . CAD (coronary artery disease)  . COPD (chronic obstructive pulmonary disease) ([place])  . Depression with anxiety  . GERD (gastroesophageal reflux disease)  . Hypothyroidism  . Myasthenia gravis ([place])  . PE (pulmonary thromboembolism) ([place])  . Sjogren's disease ([place])  . Tobacco abuse  Past Surgical History: Past Surgical History: Procedure Laterality Date . Right ulnar nerve impingement   HPI: 67 year old male with PMH as below, which is significant for Myasthenia gravis on imuran, COPD, PE on Xarelto, and CAD. He presented to ED with worsening shortness of breath and workup revealed pneumonia. Pt required multiple intubations across 9 days with one self-extubation.  Subjective: 67 year old male with PMH as below, which is significant for Myasthenia gravis on imuran, COPD, PE on Xarelto, and CAD. He presented to ED with worsening shortness of breath and workup revealed pneumonia. Pt required multiple intubations across 9 days with one self-extubation. Assessment / Plan / Recommendation CHL IP CLINICAL IMPRESSIONS 02/23/2018 Clinical Impression Pt presents with functional oropharyngeal swallowing.  There was trace, transient laryngeal penetration during the swallow with serial straw sips of thin liquid, which was spontaneously cleared.  Pt  exhibited prompt and efficient mastication and oral transit of solids.  With pill simulation there was no stasis of tablet or increase penetration.  Tablet fully cleared on esophageal sweep.  Recommend regular texture diet with thin liquid. SLP Visit Diagnosis Dysphagia, unspecified (R13.10) Attention and concentration deficit following -- Frontal lobe and executive function deficit following -- Impact on safety and function No limitations   CHL IP TREATMENT RECOMMENDATION 02/23/2018 Treatment Recommendations Therapy as outlined in treatment plan below   Prognosis 02/23/2018 Prognosis for Safe Diet Advancement Good Barriers to Reach Goals -- Barriers/Prognosis Comment -- CHL IP DIET RECOMMENDATION 02/23/2018 SLP Diet Recommendations Regular solids;Thin liquid Liquid Administration via Cup;Straw Medication Administration Whole meds with liquid Compensations Minimize environmental distractions;Slow rate;Small sips/bites Postural Changes Seated upright at 90 degrees   CHL IP OTHER RECOMMENDATIONS 02/23/2018 Recommended Consults -- Oral Care Recommendations Oral care BID Other Recommendations --   No flowsheet data found.  CHL IP FREQUENCY AND DURATION 02/23/2018 Speech Therapy Frequency (ACUTE ONLY) min 1 x/week Treatment Duration 1 week      CHL IP ORAL PHASE 02/23/2018 Oral Phase WFL Oral - Pudding Teaspoon -- Oral - Pudding Cup -- Oral - Honey Teaspoon -- Oral - Honey Cup -- Oral - Nectar Teaspoon -- Oral - Nectar Cup -- Oral - Nectar Straw -- Oral - Thin Teaspoon -- Oral - Thin Cup -- Oral - Thin Straw -- Oral - Puree -- Oral - Mech Soft -- Oral - Regular -- Oral - Multi-Consistency -- Oral - Pill -- Oral Phase - Comment --  CHL IP PHARYNGEAL PHASE 02/23/2018 Pharyngeal Phase Impaired Pharyngeal- Pudding Teaspoon -- Pharyngeal -- Pharyngeal- Pudding Cup -- Pharyngeal -- Pharyngeal- Honey Teaspoon -- Pharyngeal -- Pharyngeal- Honey Cup -- Pharyngeal -- Pharyngeal- Nectar Teaspoon -- Pharyngeal -- Pharyngeal- Nectar Cup --  Pharyngeal -- Pharyngeal- Nectar Straw -- Pharyngeal -- Pharyngeal- Thin Teaspoon -- Pharyngeal -- Pharyngeal- Thin Cup [facility] Pharyngeal Material does not enter airway  Pharyngeal- Thin Straw Delayed swallow initiation-vallecula;Penetration/Aspiration during swallow Pharyngeal Material enters airway, remains ABOVE vocal cords then ejected out Pharyngeal- Puree -- Pharyngeal -- Pharyngeal- Mechanical Soft -- Pharyngeal -- Pharyngeal- Regular -- Pharyngeal -- Pharyngeal- Multi-consistency -- Pharyngeal -- Pharyngeal- Pill -- Pharyngeal -- Pharyngeal Comment --  CHL IP CERVICAL ESOPHAGEAL PHASE 02/23/2018 Cervical Esophageal Phase WFL Pudding Teaspoon -- Pudding Cup -- Honey Teaspoon -- Honey Cup -- Nectar Teaspoon -- Nectar Cup -- Nectar Straw -- Thin Teaspoon -- Thin Cup -- Thin Straw -- Puree -- Mechanical Soft -- Regular -- Multi-consistency -- Pill -- Cervical Esophageal Comment -- Celedonio Savage, MA, [place] Office: [phone] 02/23/2018, 11:27 AM                LOS: 23 days   Signature  Lala Lund M.D on 03/03/2018 at 11:21 AM  To page go to [email or web address] - password [facility]

## 2018-03-03 NOTE — Progress Notes (Signed)
Inpatient Rehabilitation Admissions Coordinator  I have received an initial denial for admission to inpt rehab from [facility]. I am discussing with Dr. Naaman Plummer whether we have grounds to appeal. I met with patient at bedside to discuss and make him aware of initial denial. I explained that if [facility] would not approve inpt rehab, they would approve SNF. He is in agreement to this second option of SNF if needed. He would like his [employer] and [place] cover. I will follow up in the morning with the acute team for final decision on appeal vs SNF rehab.  Danne Baxter, RN, MSN Rehab Admissions Coordinator [phone] 03/03/2018 5:18 PM

## 2018-03-04 DIAGNOSIS — A021 Salmonella sepsis: Secondary | ICD-10-CM

## 2018-03-04 LAB — BASIC METABOLIC PANEL
Anion gap: 9 (ref 5–15)
BUN: 21 mg/dL (ref 8–23)
CHLORIDE: 103 mmol/L (ref 98–111)
CO2: 26 mmol/L (ref 22–32)
CREATININE: 0.73 mg/dL (ref 0.61–1.24)
Calcium: 7.4 mg/dL — ABNORMAL LOW (ref 8.9–10.3)
Glucose, Bld: 87 mg/dL (ref 70–99)
POTASSIUM: 3.1 mmol/L — AB (ref 3.5–5.1)
Sodium: 138 mmol/L (ref 135–145)

## 2018-03-04 LAB — MAGNESIUM: MAGNESIUM: 1.6 mg/dL — AB (ref 1.7–2.4)

## 2018-03-04 MED ORDER — MAGNESIUM SULFATE 2 GM/50ML IV SOLN
2.0000 g | Freq: Once | INTRAVENOUS | Status: AC
  Administered 2018-03-04: 2 g via INTRAVENOUS
  Filled 2018-03-04: qty 50

## 2018-03-04 MED ORDER — POTASSIUM CHLORIDE CRYS ER 20 MEQ PO TBCR
40.0000 meq | EXTENDED_RELEASE_TABLET | Freq: Two times a day (BID) | ORAL | Status: AC
  Administered 2018-03-04 (×2): 40 meq via ORAL
  Filled 2018-03-04 (×2): qty 2

## 2018-03-04 MED ORDER — HEPARIN SOD (PORK) LOCK FLUSH 100 UNIT/ML IV SOLN
500.0000 [IU] | INTRAVENOUS | Status: AC | PRN
  Administered 2018-03-04: 500 [IU]

## 2018-03-04 MED ORDER — PREDNISONE 5 MG PO TABS: ORAL_TABLET | ORAL | 0 refills | Status: DC

## 2018-03-04 NOTE — Progress Notes (Signed)
CSW received consult regarding PT recommendation of SNF at discharge.  Patient is refusing SNF and will go with his family at discharge if CIR is unable to accept him.  CSW signing off.   Osborne Casco Marolyn Urschel LCSW [phone]

## 2018-03-04 NOTE — Progress Notes (Signed)
Physical Therapy Treatment Patient Details Name: Curtis Preston MRN: [id number] DOB: 08-24-1950 Today's Date: 03/04/2018    History of Present Illness 67 yo admitted with SOB, multifocal PNA and sepsis. PMhx: myasthenia gravis, CoPD, PE, CAD Intubated 9/13 for hypoxia, Extubated 9/23. Remained in ICU until 9/26 for delrium     PT Comments    Patient requires min guard/min A +2 for safety with gait due to decreased activity tolerance, SpO2 desat, and 3/4 DOE. Pt with SpO2 of 78% on 3L O2 via [place] with mobility. At rest pt with SpO2 of 91% on RA and upon standing from chair SpO2 dropped to 81%. Pt educated on safe activity progression and acquiring a pulse oximeter to monitor O2 sat. Pt would benefit from post acute rehab in inpatient setting however it pt is to d/c home he will need HHPT for further skilled PT services to maximize independence and safety with mobility.     Follow Up Recommendations  CIR     Equipment Recommendations  Other (comment)(rollator)    Recommendations for Other Services Rehab consult     Precautions / Restrictions Precautions Precautions: Fall Precaution Comments: deconditioned, monitor O2 sats  Restrictions Weight Bearing Restrictions: No    Mobility  Bed Mobility Overal bed mobility: Needs Assistance Bed Mobility: Supine to Sit     Supine to sit: Min guard     General bed mobility comments: pt OOB in chair upon arrival  Transfers Overall transfer level: Needs assistance Equipment used: Rolling walker (2 wheeled) Transfers: Sit to/from Stand Sit to Stand: Min guard Stand pivot transfers: Min assist       General transfer comment: cues for safe hand placement with initial stand; no use of AD to stand but upon standing use of AD needed  Ambulation/Gait Ambulation/Gait assistance: Min guard;Min assist;+2 safety/equipment(chair follow) Gait Distance (Feet): (30-35ft bouts X4 with seated rest breaks required) Assistive device: Rolling  walker (2 wheeled) Gait Pattern/deviations: Step-through pattern;Decreased stride length;Narrow base of support;Trunk flexed Gait velocity: decreased   General Gait Details: cues for breathing technique, posture, and increased awareness of need for rest breaks   Stairs             Wheelchair Mobility    Modified Rankin (Stroke Patients Only)       Balance Overall balance assessment: Needs assistance Sitting-balance support: Feet supported;No upper extremity supported Sitting balance-Leahy Scale: Good   Postural control: Posterior lean Standing balance support: Bilateral upper extremity supported Standing balance-Leahy Scale: Poor Standing balance comment: Reliant on UE support or grabs onto/reaches for objects when walking - ie: foot of bed, chair, sink etc                            Cognition Arousal/Alertness: Awake/alert Behavior During Therapy: WFL for tasks assessed/performed Overall Cognitive Status: Within Functional Limits for tasks assessed Area of Impairment: Attention;Awareness                     Memory: Decreased short-term memory Following Commands: Follows multi-step commands inconsistently Safety/Judgement: Decreased awareness of safety;Decreased awareness of deficits   Problem Solving: Requires verbal cues        Exercises      General Comments General comments (skin integrity, edema, etc.): spO2 desat to 80% on 4L O2 via [place] during ADL's      Pertinent Vitals/Pain Pain Assessment: No/denies pain    Home Living  Prior Function            PT Goals (current goals can now be found in the care plan section) Acute Rehab PT Goals Patient Stated Goal: Go home to [place]; refusing SNF, open to CIR if possible Progress towards PT goals: Progressing toward goals    Frequency    Min 3X/week      PT Plan Current plan remains appropriate    Co-evaluation              AM-PAC PT "6  Clicks" Daily Activity  Outcome Measure  Difficulty turning over in bed (including adjusting bedclothes, sheets and blankets)?: Unable Difficulty moving from lying on back to sitting on the side of the bed? : Unable Difficulty sitting down on and standing up from a chair with arms (e.g., wheelchair, bedside commode, etc,.)?: Unable Help needed moving to and from a bed to chair (including a wheelchair)?: A Little Help needed walking in hospital room?: A Little Help needed climbing 3-5 steps with a railing? : A Lot 6 Click Score: 11    End of Session Equipment Utilized During Treatment: Gait belt;Oxygen Activity Tolerance: Treatment limited secondary to medical complications (Comment)(SpO2 desat and 3/4 DOE) Patient left: in chair;with call bell/phone within reach Nurse Communication: Mobility status PT Visit Diagnosis: Unsteadiness on feet (R26.81);History of falling (Z91.81);Muscle weakness (generalized) (M62.81);Other abnormalities of gait and mobility (R26.89);Difficulty in walking, not elsewhere classified (R26.2)     Time: 1610-9604 PT Time Calculation (min) (ACUTE ONLY): 30 min  Charges:  $Gait Training: 8-22 mins $Therapeutic Activity: 8-22 mins                     Erline Levine, PTA Acute Rehabilitation Services Pager: [phone] Office: [phone]     Carolynne Edouard 03/04/2018, 3:30 PM

## 2018-03-04 NOTE — Progress Notes (Signed)
Inpatient Rehabilitation Admissions Coordinator  Peer to peer appeal with Josephine Igo MD and Dr. Naaman Plummer is complete. Denial is upheld. I met with patient at bedside and he is aware. He refuses SNF and wants to  d/c home with his local sister with [place] until he can be weaned from oxygen. Then his plans are to fly home to [place]. I have alerted RN CM. We will sign off at this time.  Danne Baxter, RN, MSN Rehab Admissions Coordinator [phone] 03/04/2018 1:37 PM

## 2018-03-04 NOTE — Discharge Summary (Addendum)
Curtis Preston XID:[id number] DOB: March 16, 1951 DOA: 02/06/2018  PCP: No primary care provider on file.  Admit date: 02/06/2018  Discharge date: 03/05/2018  Admitted From: Home   Disposition:  CIR if denied HHPT   Recommendations for Outpatient Follow-up:   Follow up with PCP in 1-2 weeks  PCP Please obtain BMP/CBC, 2 view CXR in 1week,  (see Discharge instructions)   PCP Please follow up on the following pending results:    Home Health: RN,PT   Equipment/Devices: O2  Consultations: PCCM Discharge Condition: Fair   CODE STATUS: Full   Diet Recommendation: Heart Healthy. 1.5lit/day fluid restriction/day    Chief Complaint  Patient presents with  . Shortness of Breath  . Chest Pain  . Cough  . Congestion     Brief history of present illness from the day of admission and additional interim summary    Patient is a 67 y.o. male with history of myasthenia gravis on Imuran, PE on Xarelto, COPD not on home O2 who originally is from [place] and is here in town for his brother-in-law's funeral, he developed worsening shortness of breath and cough (started since August but worsened acutely)-he subsequently presented to the ED and was found to have pneumonia and started on empiric antibiotics.  Ultimately patient worsened and required intubation on 9/13.  While in the ICU, patient underwent bronchoscopy with negative BAL.  Upon clinical stability he was extubated and transferred to the trial hospitalist service on 9/26.  Hospital course has been complicated by ICU delirium, severe deconditioning and ongoing hypoxia but gradually improving.  See below for further details                                                                  Hospital Course    Acute hypoxic respiratory failure secondary to multifocal pneumonia  and now evidence of acute on chronic diastolic heart failure EF 55 to 60%: Patient had strep pneumonia bacteremia under the care of ICU team and has finished antibiotics for his infectious process.   He was still short of breath and was requiring 5 L nasal cannula oxygen until few days ago, likely developed acute on chronic diastolic CHF, echo was obtained and reviewed, he is down to 1 to 2 L nasal cannula oxygen and overall feels better but still short of breath with activity and still has some rails, will repeat IV Lasix on 03/03/2018.  Continue pulmonary toiletry, encouraged to increase activity.    Seen by PT for his weakness and deconditioning and qualified for CIR, initially his insurance company has refused CIR placement and patient is getting a good to be discharged.  At this point I will discharge the patient if CIR gets approved he will go to CIR or else he will go to his sister's house  locally with home health PT and RN with local outpatient follow-up with pulmonary in a week he was advised not to travel to [place] for another couple of weeks until he is cleared by pulmonary and sleep clinic here.  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  Currently qualifies for oxygen 2 L nasal cannula which she will get, continue slow steroid taper, have advised to follow with pulmonary locally if he stays in town in the next 7 to 10 days.  Leukocytosis: Likely secondary to steroids-slowly improving-completed a course of antimicrobial therapy.  Follow.  Myasthenia gravis: Seen by neurology-not felt to have myasthenic crisis-Imuran remains on hold.  Continue Mestinon.  COPD: Not Wheezing-moving air well-continue bronchodilators  Hypothyroidism: Continue Synthroid  History of pulmonary embolism: Continue rivaroxaban.  ICU delirium: Resolved  Severe deconditioning: Continue PT,  CIR if approved or else sisters home with home health PT and  RN.   Discharge diagnosis     Principal Problem:   Sepsis ([place]) Active Problems:   CAD (coronary artery disease)   COPD (chronic obstructive pulmonary disease) (HCC)   Depression with anxiety   GERD (gastroesophageal reflux disease)   Hypothyroidism   Myasthenia gravis ([place])   PE (pulmonary thromboembolism) (HCC)   Tobacco abuse   Multifocal pneumonia   Respiratory failure (HCC)   Acute respiratory failure with hypoxemia (HCC)   ARDS (adult respiratory distress syndrome) (HCC)   Malnutrition of moderate degree   Endotracheally intubated    Discharge instructions    Discharge Instructions    Diet - low sodium heart healthy   Complete by:  As directed    Discharge instructions   Complete by:  As directed    Follow with Primary MD in 5 days you must follow with either the local recommended pulmonologist or your pulmonologist in [place] within 2 weeks.  Get CBC, CMP, 2 view Chest X ray checked  by Primary MD  5 days   Activity: As tolerated with Full fall precautions use walker/cane & assistance as needed  Disposition Home   Diet: Heart Healthy    For Heart failure patients - Check your Weight same time everyday, if you gain over 2 pounds, or you develop in leg swelling, experience more shortness of breath or chest pain, call your Primary MD immediately. Follow Cardiac Low Salt Diet and 1.5 lit/day fluid restriction.  Special Instructions: If you have smoked or chewed Tobacco  in the last 2 yrs please stop smoking, stop any regular Alcohol  and or any Recreational drug use.  On your next visit with your primary care physician please Get Medicines reviewed and adjusted.  Please request your Prim.MD to go over all Hospital Tests and Procedure/Radiological results at the follow up, please get all Hospital records sent to your Prim MD by signing hospital release before you go home.  If you experience worsening of your admission symptoms, develop shortness of breath, life  threatening emergency, suicidal or homicidal thoughts you must seek medical attention immediately by calling 911 or calling your MD immediately  if symptoms less severe.  You Must read complete instructions/literature along with all the possible adverse reactions/side effects for all the Medicines you take and that have been prescribed to you. Take any new Medicines after you have completely understood and accpet all the possible adverse reactions/side effects.   Do not drive, operate heavy machinery, perform activities at heights, swimming or participation in water activities or provide baby sitting services  if your were admitted for syncope or siezures until you have seen by Primary MD or a Neurologist and advised to do so again.  Do not drive when taking Pain medications.    Do not take more than prescribed Pain, Sleep and Anxiety Medications  Wear Seat belts while driving.   Please note  You were cared for by a hospitalist during your hospital stay. If you have any questions about your discharge medications or the care you received while you were in the hospital after you are discharged, you can call the unit and asked to speak with the hospitalist on call if the hospitalist that took care of you is not available. Once you are discharged, your primary care physician will handle any further medical issues. Please note that NO REFILLS for any discharge medications will be authorized once you are discharged, as it is imperative that you return to your primary care physician (or establish a relationship with a primary care physician if you do not have one) for your aftercare needs so that they can reassess your need for medications and monitor your lab values.   Discharge patient   Complete by:  As directed    Discharge disposition:  01-Home or Self Care   Discharge patient date:  03/05/2018   Increase activity slowly   Complete by:  As directed       Discharge Medications   Allergies as  of 03/05/2018      Reactions   Hydromorphone Other (See Comments)   hallucinations   Immune Globulin Other (See Comments)    Bilateral pulmonary embolism       Medication List    STOP taking these medications   baclofen 10 MG tablet Commonly known as:  LIORESAL   HYDROcodone-acetaminophen 5-325 MG tablet Commonly known as:  NORCO/VICODIN     TAKE these medications   ASPIRIN 81 81 MG EC tablet Generic drug:  aspirin Take 81 mg by mouth daily.   atorvastatin 40 MG tablet Commonly known as:  LIPITOR Take 40 mg by mouth at bedtime.   azathioprine 100 MG tablet Commonly known as:  IMURAN Take 200 mg by mouth at bedtime.   azelastine 0.1 % nasal spray Commonly known as:  ASTELIN Place 2 sprays into the nose 2 (two) times daily.   benzonatate 200 MG capsule Commonly known as:  TESSALON Take 1 capsule by mouth as needed for cough.   buPROPion 150 MG 12 hr tablet Commonly known as:  WELLBUTRIN SR Take 150 mg by mouth 2 (two) times daily.   Calcium Carb-Ergocalciferol 500-200 MG-UNIT Tabs Take 1 tablet by mouth daily.   fluticasone 50 MCG/ACT nasal spray Commonly known as:  FLONASE Place 1 spray into the nose 2 (two) times daily.   furosemide 40 MG tablet Commonly known as:  LASIX Take 1 tablet (40 mg total) by mouth daily.   guaiFENesin-dextromethorphan 100-10 MG/5ML syrup Commonly known as:  ROBITUSSIN DM Take 10 mLs by mouth 2 (two) times daily as needed for cough.   ICAPS AREDS 2 PO Take 1 tablet by mouth 2 (two) times daily.   ipratropium 0.03 % nasal spray Commonly known as:  ATROVENT Place 1 spray into both nostrils every 12 (twelve) hours.   ipratropium-albuterol 0.5-2.5 (3) MG/3ML Soln Commonly known as:  DUONEB Use twice a day scheduled and every 4 hours as needed for shortness of breath and wheezing What changed:    how much to take  how to take this  when  to take this  additional instructions   levothyroxine 175 MCG tablet Commonly known  as:  SYNTHROID, LEVOTHROID Take 175 mcg by mouth daily before breakfast.   mirtazapine 15 MG tablet Commonly known as:  REMERON Take 30 mg by mouth at bedtime.   montelukast 10 MG tablet Commonly known as:  SINGULAIR Take 10 mg by mouth at bedtime.   morphine 15 MG 12 hr tablet Commonly known as:  MS CONTIN Take 15 mg by mouth at bedtime.   omeprazole 40 MG capsule Commonly known as:  PRILOSEC Take 40 mg by mouth 2 (two) times daily.   ondansetron 4 MG tablet Commonly known as:  ZOFRAN Take 4 mg by mouth every 6 (six) hours as needed for nausea or vomiting.   potassium chloride 10 MEQ tablet Commonly known as:  K-DUR Take 1 tablet (10 mEq total) by mouth daily.   predniSONE 5 MG tablet Commonly known as:  DELTASONE Label  & dispense according to the schedule below. 10 Pills PO for 3 days then, 8 Pills PO for 3 days, 6 Pills PO for 3 days, 4 Pills PO for 3 days, 2 Pills PO for 3 days, 1 Pills PO for 3 days, 1/2 Pill  PO for 3 days then STOP. Total 95 pills.   pregabalin 50 MG capsule Commonly known as:  LYRICA Take 50 mg by mouth at bedtime.   pyridostigmine 60 MG tablet Commonly known as:  MESTINON Take 60 mg by mouth 4 (four) times daily.   rivaroxaban 20 MG Tabs tablet Commonly known as:  XARELTO Take 20 mg by mouth daily.   STIOLTO RESPIMAT 2.5-2.5 MCG/ACT Aers Generic drug:  Tiotropium Bromide-Olodaterol Inhale 2 puffs into the lungs daily.   venlafaxine 75 MG tablet Commonly known as:  EFFEXOR Take 225 mg by mouth daily.   vitamin B-12 1000 MCG tablet Commonly known as:  CYANOCOBALAMIN Take 1,000 mcg by mouth daily.            Durable Medical Equipment  (From admission, onward)         Start     Ordered   03/05/18 0549  For home use only DME Nebulizer/meds  Once    Question:  Patient needs a nebulizer to treat with the following condition  Answer:  COPD (chronic obstructive pulmonary disease) ([place])   03/05/18 3295   03/04/18 1707  For home use  only DME Nebulizer machine  Once    Question:  Patient needs a nebulizer to treat with the following condition  Answer:  PNA (pneumonia)   03/04/18 1707   03/04/18 1550  DME Oxygen  Once    Comments:  2 lits at rest, 4 Lit when ambulating  Question Answer Comment  Mode or (Route) Nasal cannula   Liters per Minute 4   Frequency Continuous (stationary and portable oxygen unit needed)   Oxygen conserving device Yes   Oxygen delivery system Gas      03/04/18 1550          Follow-up Information    [place]. Go on 03/10/2018.   Why:  9:00am with Dr.Wright, hospital follow up Contact information: [address] [place] [address] [phone]       Brand Males, MD. Schedule an appointment as soon as possible for a visit in 1 week(s).   Specialty:  Pulmonary Disease Why:  ? ILD Contact information: [place] [address] [phone]  [facility] Follow up.           Major procedures and Radiology Reports - PLEASE review detailed and final reports thoroughly  -     Significant events: 9/13-intubated 9/13-bronchwith BAL 9/19-repeated bronchwith BAL 9/22-extubated,remainedin the ICU due to delirium  Dg Chest 2 View  Result Date: 02/28/2018 CLINICAL DATA:  Recent pneumonia EXAM: CHEST - 2 VIEW COMPARISON:  02/26/2018 FINDINGS: Normal heart size. Left subclavian Port-A-Cath is stable. Patchy airspace opacities throughout both lungs are stable. No pneumothorax. No pleural effusion. Mid-level thoracic compression deformity is stable compare with a recent CT. IMPRESSION: Stable bilateral airspace opacities. Electronically Signed   By: Marybelle Killings M.D.   On: 02/28/2018 15:13   Dg Chest 2 View  Result Date: 02/08/2018 CLINICAL DATA:  Chest pain and shortness of breath EXAM: CHEST - 2 VIEW COMPARISON:  February 06, 2018 FINDINGS: The left central line is stable. There appears to be a small  bulla in the left apex. No pneumothorax. Bilateral pulmonary opacities most prominent in the mid lower lungs. No other interval changes or acute abnormalities. IMPRESSION: 1. Stable left Port-A-Cath. 2. Bilateral pulmonary opacities may represent worsening edema versus worsening bilateral pneumonia. Recommend clinical correlation and follow-up to resolution. Electronically Signed   By: Dorise Bullion III M.D   On: 02/08/2018 07:08   Dg Chest 2 View  Result Date: 02/06/2018 CLINICAL DATA:  Shortness of breath cough and congestion for 3 days. EXAM: CHEST - 2 VIEW COMPARISON:  None. FINDINGS: Left subclavian injectable port terminates at the expected location of the cavoatrial junction. Cardiomediastinal silhouette is normal. Mediastinal contours appear intact. There is no evidence of pleural effusion or pneumothorax. Patchy interstitial and alveolar opacities in bilateral lungs, with particular prominence in the right upper lobe, right middle lobe and left lower lobe. Possible pulmonary nodule in the right mid lung field. Osseous structures are without acute abnormality. Soft tissues are grossly normal. IMPRESSION: Patchy interstitial and alveolar opacities in bilateral lungs. Findings may represent multifocal pneumonia or developing asymmetric pulmonary edema. Possible pulmonary nodule in the right mid lung field. Electronically Signed   By: Fidela Salisbury M.D.   On: 02/06/2018 21:12   Dg Abd 1 View  Result Date: 02/15/2018 CLINICAL DATA:  Nausea and vomiting EXAM: ABDOMEN - 1 VIEW COMPARISON:  None. FINDINGS: Scattered large and small bowel gas is noted. Cecum is mildly prominent at almost 12 cm. No free air is seen. Nasogastric catheter is noted within the stomach. No bony abnormality is seen. No mass lesion is noted. IMPRESSION: Gas dilated cecum to 12 cm. Nasogastric catheter within the stomach. Electronically Signed   By: Inez Catalina M.D.   On: 02/15/2018 09:46   Ct Chest Wo Contrast  Result  Date: 02/18/2018 CLINICAL DATA:  Increased secretions, ventilator dependent. EXAM: CT CHEST WITHOUT CONTRAST TECHNIQUE: Multidetector CT imaging of the chest was performed following the standard protocol without IV contrast. COMPARISON:  Radiograph of February 16, 2018. CT scan of February 08, 2018. FINDINGS: Cardiovascular: Atherosclerosis of thoracic aorta is noted without aneurysm formation. Coronary artery calcifications are noted. Normal cardiac size. No pericardial effusion. Mediastinum/Nodes: Feeding tube is seen passing through esophagus into stomach. Significantly increased mediastinal adenopathy is noted compared to prior exam, most likely inflammatory in etiology; this includes 14 mm precarinal lymph node. 11 mm right paratracheal lymph node is noted and and increased compared to prior exam. 12 mm left hilar lymph node is noted which is enlarged compared to prior exam. Tracheostomy tube is  well positioned. Lungs/Pleura: No pneumothorax is noted. Emphysematous disease is again noted. Significantly increased lung opacities are noted bilaterally and diffusely most consistent with worsening pneumonia. No significant pleural effusion is noted. Upper Abdomen: No acute abnormality. Musculoskeletal: No chest wall mass or suspicious bone lesions identified. IMPRESSION: Significantly increased airspace opacities are noted throughout both lungs consistent with severely worsening pneumonia. Increased mediastinal adenopathy is also noted most likely is inflammatory in etiology. Coronary artery calcifications are noted. Aortic Atherosclerosis (ICD10-I70.0) and Emphysema (ICD10-J43.9). Electronically Signed   By: Marijo Conception, M.D.   On: 02/18/2018 16:50   Ct Chest Wo Contrast  Result Date: 02/08/2018 CLINICAL DATA:  Progressive hypoxemia.  Pneumonia. EXAM: CT CHEST WITHOUT CONTRAST TECHNIQUE: Multidetector CT imaging of the chest was performed following the standard protocol without IV contrast. COMPARISON:   Chest x-ray 02/08/2018 FINDINGS: Cardiovascular: Heart size is normal. Atherosclerotic calcifications are present coronary arteries. Atherosclerotic changes are present at the aortic arch. Pulmonary arteries mildly prominent bilaterally. No significant pericardial effusion is present. Mediastinum/Nodes: No significant mediastinal, axillary, or hilar adenopathy is present. The esophagus is within normal limits. The thoracic inlet is unremarkable. Lungs/Pleura: Extensive confluent ground-glass attenuation is present throughout the lower lobes, the lingula, and right middle lobe. Consolidation is most evident in the superior segment of the left lower lobe. Extensive centrilobular emphysematous changes are present. No significant pleural effusion is present. Upper Abdomen: Unremarkable. Musculoskeletal: A remote superior endplate fractures present at T7. Vertebral body heights alignment are otherwise maintained. No focal lytic or blastic lesions are present. Ribs are within normal limits. IMPRESSION: 1. Extensive confluent ground-glass attenuation throughout both lungs with consolidation more prevalent in the superior segment of the left lower lobe. The findings are nonspecific, but most concerning for multi lobar pneumonia. Edema or possibly could have a similar appearance. This is to extensive for atelectasis. Consolidation in the superior segment in the left lower lobe suggests infection. 2.  Aortic Atherosclerosis (ICD10-I70.0). 3.  Emphysema (ICD10-J43.9). 4. Coronary artery disease Electronically Signed   By: San Morelle M.D.   On: 02/08/2018 11:47   Dg Chest Port 1 View  Result Date: 02/26/2018 CLINICAL DATA:  Acute respiratory failure EXAM: PORTABLE CHEST 1 VIEW COMPARISON:  10/25/2017 FINDINGS: Left subclavian Port-A-Cath is stable. The heart is borderline enlarged. Patchy bilateral airspace disease right greater than left is stable. No pneumothorax. No pleural effusion. IMPRESSION: Stable  bilateral airspace disease right greater than left. Electronically Signed   By: Marybelle Killings M.D.   On: 02/26/2018 09:26   Dg Chest Port 1 View  Result Date: 02/25/2018 CLINICAL DATA:  Respiratory failure, shortness of breath. EXAM: PORTABLE CHEST 1 VIEW COMPARISON:  Radiograph of February 22, 2018. FINDINGS: Stable cardiomediastinal silhouette. Endotracheal and nasogastric tubes have been removed. Left subclavian Port-A-Cath is unchanged in position. No pneumothorax is noted. Stable bilateral lung opacities are noted concerning for multifocal pneumonia, most prominently seen in left lung base. Mild left pleural effusion may be present. Bony thorax is unremarkable. IMPRESSION: Endotracheal and nasogastric tubes have been removed. Stable bilateral lung opacities are noted consistent with multifocal pneumonia. Mild left pleural effusion is noted. Electronically Signed   By: Marijo Conception, M.D.   On: 02/25/2018 10:29   Dg Chest Port 1 View  Result Date: 02/22/2018 CLINICAL DATA:  Respiratory distress. Follow-up exam. Interstitial lung disease EXAM: PORTABLE CHEST 1 VIEW COMPARISON:  02/19/2018 and older exams. FINDINGS: Hazy, patchy areas of airspace lung opacity are superimposed on chronic interstitial thickening, without significant change  compared to the most recent prior exam. There are no new lung abnormalities. No convincing pleural effusion.  No pneumothorax. Right subclavian central line has been removed. Left subclavian central venous line, endotracheal tube and enteric tube are stable. IMPRESSION: 1. No change in lung aeration since the most recent prior exam. Airspace opacities consistent with multifocal pneumonia are superimposed on chronic interstitial lung disease. 2. Remaining support apparatus is stable and well positioned. Electronically Signed   By: Lajean Manes M.D.   On: 02/22/2018 08:00   Dg Chest Port 1 View  Result Date: 02/19/2018 CLINICAL DATA:  Interstitial lung disease,  evaluate endotracheal tube placement EXAM: PORTABLE CHEST 1 VIEW COMPARISON:  CT chest of 02/18/2018 and chest x-ray of 02/16/2017 FINDINGS: The tip of the endotracheal tube is approximately 5.8 cm above the carina. Airspace disease noted recently has improved somewhat in the interval. No pleural effusion is seen. Right central venous line tip overlies the upper SVC and a left central venous line tip overlies the lower SVC. The heart is within upper limits of normal and stable. Feeding tube extends below the hemidiaphragm. IMPRESSION: 1. Tip of endotracheal tube 5.8 cm above the carina. 2. Some improvement in patchy airspace disease bilaterally. Electronically Signed   By: Ivar Drape M.D.   On: 02/19/2018 14:09   Dg Chest Port 1 View  Result Date: 02/16/2018 CLINICAL DATA:  Acute respiratory failure with hypoxia EXAM: PORTABLE CHEST 1 VIEW COMPARISON:  Yesterday FINDINGS: Endotracheal tube tip between the clavicular heads and carina. An orogastric tube at least reaches the pylorus. Bilateral central line with tip at the SVC. Unchanged asymmetric airspace disease, presumed infection in this immunocompromised patient. IMPRESSION: 1. Stable hardware positioning. The orogastric tube may intubate the duodenum. 2. Unchanged airspace disease. Electronically Signed   By: Monte Fantasia M.D.   On: 02/16/2018 07:12   Dg Chest Port 1 View  Result Date: 02/15/2018 CLINICAL DATA:  Intubation. History of COPD. EXAM: PORTABLE CHEST 1 VIEW COMPARISON:  02/14/2018 FINDINGS: Endotracheal tube with tip measuring 7 cm above the carina. Enteric tube tip is off the field of view but below the left hemidiaphragm. Bilateral central venous catheters with tips over the distal SVC region. No pneumothorax. Heart size is normal. There is patchy airspace disease throughout both lungs, demonstrating progression since previous study. No blunting of costophrenic angles. IMPRESSION: Appliances appear to be in satisfactory position. Diffuse  bilateral airspace disease in the lungs, demonstrating progression since previous study. Electronically Signed   By: Lucienne Capers M.D.   On: 02/15/2018 22:25   Dg Chest Port 1 View  Result Date: 02/14/2018 CLINICAL DATA:  History of endotracheal tube EXAM: PORTABLE CHEST 1 VIEW COMPARISON:  Yesterday FINDINGS: Endotracheal tube tip between the clavicular heads and carina. An orogastric tube and side-port reaches the stomach. Gas is seen within the stomach although decreased from prior. Bilateral subclavian line with tip at the SVC. Diffuse interstitial and airspace opacity, stable. Normal heart size and stable mediastinal contours. No effusion or pneumothorax. IMPRESSION: 1. Unremarkable hardware positioning. 2. Persistent airspace disease in the setting of immunocompromise status post recent BAL. Electronically Signed   By: Monte Fantasia M.D.   On: 02/14/2018 08:12   Dg Chest Port 1 View  Result Date: 02/13/2018 CLINICAL DATA:  History of ETT EXAM: PORTABLE CHEST 1 VIEW COMPARISON:  02/13/2018, 02/12/2018, 02/10/2018, CT chest 02/08/2017 FINDINGS: Endotracheal tube tip is about 4.5 cm superior to the carina. Esophageal tube tip is below the diaphragm but non  included. Bilateral central venous catheter tips over the SVC. Stable cardiomediastinal silhouette. Interval worsening of interstitial and ground-glass opacity within the bilateral lungs. No pneumothorax. IMPRESSION: 1. Endotracheal tube tip about 4.5 cm superior to the carina. 2. Slight worsening of diffuse bilateral interstitial and ground-glass edema or infiltrates. Electronically Signed   By: Donavan Foil M.D.   On: 02/13/2018 23:39   Dg Chest Port 1 View  Result Date: 02/13/2018 CLINICAL DATA:  Endotracheal tube placement. EXAM: PORTABLE CHEST 1 VIEW COMPARISON:  02/12/2018 FINDINGS: Endotracheal tube has tip 7.3 cm above the carina. Left subclavian central venous catheter is unchanged. Interval placement of right subclavian central  venous catheter with tip over the SVC. Lungs are adequately inflated demonstrate continued bilateral hazy interstitial process right worse than left with slight interval improved aeration. No effusion. No pneumothorax. Cardiomediastinal silhouette and remainder the exam is unchanged. IMPRESSION: Persistent bilateral hazy interstitial process with slight improved aeration. Tubes and lines as described.  No pneumothorax. Electronically Signed   By: Marin Olp M.D.   On: 02/13/2018 15:13   Dg Chest Port 1 View  Result Date: 02/12/2018 CLINICAL DATA:  Hypoxia. EXAM: PORTABLE CHEST 1 VIEW COMPARISON:  Prior exams over the last week with radiographs. Chest CT 02/08/2018. No remote exams available. FINDINGS: Left central line with tip in the SVC. Diffuse interstitial/ground-glass opacities with progression in the right suprahilar lung. Unchanged heart size and mediastinal contours. Again seen apical emphysema. No pneumothorax or pleural effusion. IMPRESSION: Diffuse interstitial/ground-glass opacities with progression in the right suprahilar lung. Differential considerations include infectious or inflammatory etiologies or pulmonary edema. Given history of Sjogren's syndrome, interstitial lung disease is also considered. No remote exams available for comparison. Electronically Signed   By: Keith Rake M.D.   On: 02/12/2018 21:19   Dg Chest Port 1 View  Result Date: 02/10/2018 CLINICAL DATA:  Shortness of breath, respiratory failure EXAM: PORTABLE CHEST 1 VIEW COMPARISON:  02/08/2018 FINDINGS: Diffuse interstitial and alveolar opacities throughout the lungs. Heart is borderline in size. No visible effusions or acute bony abnormality. Minimal change since prior study. IMPRESSION: Continued bilateral diffuse pulmonary opacities which could reflect edema or pneumonia. Electronically Signed   By: Rolm Baptise M.D.   On: 02/10/2018 09:25   Dg Abd Portable 1v  Result Date: 02/18/2018 CLINICAL DATA:  Feeding  tube placement. EXAM: PORTABLE ABDOMEN - 1 VIEW COMPARISON:  Radiograph of February 16, 2018. FINDINGS: The bowel gas pattern is normal. Distal tip of feeding tube appears to be in expected position of fourth portion of duodenum near the ligament of Treitz. No radio-opaque calculi or other significant radiographic abnormality are seen. IMPRESSION: Distal tip of feeding tube has been advanced into expected position of fourth portion of duodenum near ligament of Treitz. Electronically Signed   By: Marijo Conception, M.D.   On: 02/18/2018 10:56   Dg Abd Portable 1v  Result Date: 02/16/2018 CLINICAL DATA:  Check feeding catheter placement EXAM: PORTABLE ABDOMEN - 1 VIEW COMPARISON:  02/15/2018 FINDINGS: Nasogastric catheter has been removed. A feeding catheter is now seen and appears to extend beyond the pylorus into the first portion of the duodenum. Persistent cecal prominence is noted. No other focal abnormality is seen. IMPRESSION: Feeding catheter which appears to lie within the first portion of the duodenum. Electronically Signed   By: Inez Catalina M.D.   On: 02/16/2018 16:48   Dg Swallowing Func-speech Pathology  Result Date: 02/23/2018 Objective Swallowing Evaluation: Type of Study: MBS-Modified Barium Swallow Study  Patient Details Name: Curtis Preston MRN: [id number] Date of Birth: 02/12/1951 Today's Date: 02/23/2018 Time: SLP Start Time (ACUTE ONLY): 1033 -SLP Stop Time (ACUTE ONLY): 1042 SLP Time Calculation (min) (ACUTE ONLY): 9 min Past Medical History: Past Medical History: Diagnosis Date . Bronchiectasis ([place])  . CAD (coronary artery disease)  . COPD (chronic obstructive pulmonary disease) ([place])  . Depression with anxiety  . GERD (gastroesophageal reflux disease)  . Hypothyroidism  . Myasthenia gravis ([place])  . PE (pulmonary thromboembolism) ([place])  . Sjogren's disease ([place])  . Tobacco abuse  Past Surgical History: Past Surgical History: Procedure Laterality Date . Right ulnar nerve impingement    HPI: 67 year old male with PMH as below, which is significant for Myasthenia gravis on imuran, COPD, PE on Xarelto, and CAD. He presented to ED with worsening shortness of breath and workup revealed pneumonia. Pt required multiple intubations across 9 days with one self-extubation.  Subjective: 67 year old male with PMH as below, which is significant for Myasthenia gravis on imuran, COPD, PE on Xarelto, and CAD. He presented to ED with worsening shortness of breath and workup revealed pneumonia. Pt required multiple intubations across 9 days with one self-extubation. Assessment / Plan / Recommendation CHL IP CLINICAL IMPRESSIONS 02/23/2018 Clinical Impression Pt presents with functional oropharyngeal swallowing.  There was trace, transient laryngeal penetration during the swallow with serial straw sips of thin liquid, which was spontaneously cleared.  Pt exhibited prompt and efficient mastication and oral transit of solids.  With pill simulation there was no stasis of tablet or increase penetration.  Tablet fully cleared on esophageal sweep.  Recommend regular texture diet with thin liquid. SLP Visit Diagnosis Dysphagia, unspecified (R13.10) Attention and concentration deficit following -- Frontal lobe and executive function deficit following -- Impact on safety and function No limitations   CHL IP TREATMENT RECOMMENDATION 02/23/2018 Treatment Recommendations Therapy as outlined in treatment plan below   Prognosis 02/23/2018 Prognosis for Safe Diet Advancement Good Barriers to Reach Goals -- Barriers/Prognosis Comment -- CHL IP DIET RECOMMENDATION 02/23/2018 SLP Diet Recommendations Regular solids;Thin liquid Liquid Administration via Cup;Straw Medication Administration Whole meds with liquid Compensations Minimize environmental distractions;Slow rate;Small sips/bites Postural Changes Seated upright at 90 degrees   CHL IP OTHER RECOMMENDATIONS 02/23/2018 Recommended Consults -- Oral Care Recommendations Oral care BID  Other Recommendations --   No flowsheet data found.  CHL IP FREQUENCY AND DURATION 02/23/2018 Speech Therapy Frequency (ACUTE ONLY) min 1 x/week Treatment Duration 1 week      CHL IP ORAL PHASE 02/23/2018 Oral Phase WFL Oral - Pudding Teaspoon -- Oral - Pudding Cup -- Oral - Honey Teaspoon -- Oral - Honey Cup -- Oral - Nectar Teaspoon -- Oral - Nectar Cup -- Oral - Nectar Straw -- Oral - Thin Teaspoon -- Oral - Thin Cup -- Oral - Thin Straw -- Oral - Puree -- Oral - Mech Soft -- Oral - Regular -- Oral - Multi-Consistency -- Oral - Pill -- Oral Phase - Comment --  CHL IP PHARYNGEAL PHASE 02/23/2018 Pharyngeal Phase Impaired Pharyngeal- Pudding Teaspoon -- Pharyngeal -- Pharyngeal- Pudding Cup -- Pharyngeal -- Pharyngeal- Honey Teaspoon -- Pharyngeal -- Pharyngeal- Honey Cup -- Pharyngeal -- Pharyngeal- Nectar Teaspoon -- Pharyngeal -- Pharyngeal- Nectar Cup -- Pharyngeal -- Pharyngeal- Nectar Straw -- Pharyngeal -- Pharyngeal- Thin Teaspoon -- Pharyngeal -- Pharyngeal- Thin Cup [facility] Pharyngeal Material does not enter airway Pharyngeal- Thin Straw Delayed swallow initiation-vallecula;Penetration/Aspiration during swallow Pharyngeal Material enters airway, remains ABOVE vocal cords then ejected out  Pharyngeal- Puree -- Pharyngeal -- Pharyngeal- Mechanical Soft -- Pharyngeal -- Pharyngeal- Regular -- Pharyngeal -- Pharyngeal- Multi-consistency -- Pharyngeal -- Pharyngeal- Pill -- Pharyngeal -- Pharyngeal Comment --  CHL IP CERVICAL ESOPHAGEAL PHASE 02/23/2018 Cervical Esophageal Phase WFL Pudding Teaspoon -- Pudding Cup -- Honey Teaspoon -- Honey Cup -- Nectar Teaspoon -- Nectar Cup -- Nectar Straw -- Thin Teaspoon -- Thin Cup -- Thin Straw -- Puree -- Mechanical Soft -- Regular -- Multi-consistency -- Pill -- Cervical Esophageal Comment -- Celedonio Savage, MA, [place] Office: [phone] 02/23/2018, 11:27 AM               Micro Results     No results found for this or any previous visit  (from the past 240 hour(s)).  Today   Subjective    Curtis Preston today has no headache,no chest abdominal pain,no new weakness tingling or numbness, feels much better wants to go home today.     Objective   Blood pressure (!) 107/58, pulse 70, temperature 97.8 F (36.6 C), resp. rate 20, height _0  (1.854 m), weight 72 kg, SpO2 100 %.   Intake/Output Summary (Last 24 hours) at 03/05/2018 0822 Last data filed at 03/04/2018 1710 Gross per 24 hour  Intake 610 ml  Output 600 ml  Net 10 ml    Exam Awake Alert, Oriented x 3, No new F.N deficits, Normal affect [place].AT,PERRAL Supple Neck,No JVD, No cervical lymphadenopathy appriciated.  Symmetrical Chest wall movement, Good air movement bilaterally, few rales RRR,No Gallops,Rubs or new Murmurs, No Parasternal Heave +ve B.Sounds, Abd Soft, Non tender, No organomegaly appriciated, No rebound -guarding or rigidity. No Cyanosis, Clubbing or edema, No new Rash or bruise   Data Review   CBC w Diff:  Lab Results  Component Value Date   WBC 14.4 (H) 02/28/2018   HGB 11.4 (L) 02/28/2018   HCT 35.7 (L) 02/28/2018   PLT 545 (H) 02/28/2018   LYMPHOPCT 19 02/27/2018   MONOPCT 8 02/27/2018   EOSPCT 1 02/27/2018   BASOPCT 1 02/27/2018    CMP:  Lab Results  Component Value Date   NA 140 03/05/2018   K 4.1 03/05/2018   CL 107 03/05/2018   CO2 23 03/05/2018   BUN 21 03/05/2018   CREATININE 0.79 03/05/2018   PROT 4.9 (L) 02/21/2018   ALBUMIN 1.5 (L) 02/21/2018   BILITOT 0.4 02/21/2018   ALKPHOS 105 02/21/2018   AST 44 (H) 02/21/2018   ALT 26 02/21/2018  .   Total Time in preparing paper work, data evaluation and todays exam - 67 minutes  Lala Lund M.D on 03/05/2018 at 8:22 AM  [facility]   Office  [phone]

## 2018-03-04 NOTE — Progress Notes (Signed)
Occupational Therapy Treatment Patient Details Name: Curtis Preston MRN: [id number] DOB: 1951/01/29 Today's Date: 03/04/2018    History of present illness 67 yo admitted with SOB, multifocal PNA and sepsis. PMhx: myasthenia gravis, CoPD, PE, CAD Intubated 9/13 for hypoxia, Extubated 9/23. Remained in ICU until 9/26 for delrium    OT comments  Pt is limited in ability to perform ADL's and functional mobility/transfers related to ADL's secondary to overall deconditioned status & deSats w/ activity. Pt also demonstrates decreased awareness of deficits and should benefit from In-pt Rehab on CIR if able. Will cont to follow acutely.   Follow Up Recommendations  CIR;Supervision/Assistance - 24 hour    Equipment Recommendations  3 in 1 bedside commode    Recommendations for Other Services      Precautions / Restrictions Precautions Precautions: Fall Precaution Comments: deconditioned, monitor O2 sats  Restrictions Weight Bearing Restrictions: No       Mobility Bed Mobility Overal bed mobility: Needs Assistance Bed Mobility: Supine to Sit     Supine to sit: Min guard        Transfers Overall transfer level: Needs assistance Equipment used: Rolling walker (2 wheeled)(Pt declined use of RW however is more steady when using. ) Transfers: Sit to/from [employer] Sit to Stand: Min guard Stand pivot transfers: Min assist       General transfer comment: min guard for safety    Balance Overall balance assessment: Needs assistance Sitting-balance support: Feet supported;No upper extremity supported Sitting balance-Leahy Scale: Fair   Postural control: Posterior lean Standing balance support: Bilateral upper extremity supported Standing balance-Leahy Scale: Poor Standing balance comment: Reliant on UE support or grabs onto/reaches for objects when walking - ie: foot of bed, chair, sink etc                           ADL either performed or  assessed with clinical judgement   ADL Overall ADL's : Needs assistance/impaired     Grooming: Wash/dry hands;Wash/dry face;Oral care;Applying deodorant;Set up;Min guard;Sitting   Upper Body Bathing: Minimal assistance;Sitting Upper Body Bathing Details (indicate cue type and reason): Pt becomes very SOB with seated activity, O2 on 4L during ADL's via nasal cannula.  Lower Body Bathing: Minimal assistance;Sit to/from stand Lower Body Bathing Details (indicate cue type and reason): See above Upper Body Dressing : Supervision/safety;Sitting;Set up   Lower Body Dressing: Moderate assistance;Sit to/from stand Lower Body Dressing Details (indicate cue type and reason): Don/doff socks as pt is very SOB decreased awareness of deficits and overall deconditioning. Toilet Transfer: Minimal assistance;Ambulation(Simulated transfer from EOB to recliner chair.) Toilet Transfer Details (indicate cue type and reason): Pt would be more steady with RW, declined. On 4L O2 and very SOB O2 sats decreased to 80%, requires max VC's for pursed lip breathing and precautions. Resumed to 90's after >38min seated Toileting- Clothing Manipulation and Hygiene: Minimal assistance;Sit to/from stand       Functional mobility during ADLs: Minimal assistance;Cueing for safety;Cueing for sequencing;Rolling walker(Pt resistent to use of RW, however he exhibits decreaed awareness of deficits in general. Very deconditioned - recommend 2nd person for chair follow for functional mobility greater than bed to chair or bed to sit at sink) General ADL Comments: Pt continues to be limited by deconditioned status. He tends to rush/be implusive through ADL's which impacts his SOB and fatigue. He requires max VC's for breathing techniques and overall demonstrates decreased awareness of deficits. Lowest SAT noted at 80%,  returned to 90's after seated on 4L O2 via nasal cannula after >59min. He will benefit from Energy conservation. Reviewed  pursed lip breathing ex's throughout.session with little to no carryover. Cont to Rec CIR In-pt Rehab     Vision Baseline Vision/History: Wears glasses Patient Visual Report: No change from baseline     Perception     Praxis      Cognition Arousal/Alertness: Awake/alert Behavior During Therapy: WFL for tasks assessed/performed Overall Cognitive Status: Within Functional Limits for tasks assessed Area of Impairment: Attention;Awareness                     Memory: Decreased short-term memory Following Commands: Follows multi-step commands inconsistently Safety/Judgement: Decreased awareness of safety;Decreased awareness of deficits   Problem Solving: Requires verbal cues          Exercises     Shoulder Instructions       General Comments spO2 desat to 80% on 4L O2 via [place] during ADL's    Pertinent Vitals/ Pain       Pain Assessment: No/denies pain Pain Score: 0-No pain  Home Living                                          Prior Functioning/Environment              Frequency  Min 2X/week        Progress Toward Goals  OT Goals(current goals can now be found in the care plan section)  Progress towards OT goals: Progressing toward goals  Acute Rehab OT Goals Patient Stated Goal: Go home to [place]; refusing SNF, open to CIR if possible  Plan Discharge plan remains appropriate    Co-evaluation                 AM-PAC PT "6 Clicks" Daily Activity     Outcome Measure   Help from another person eating meals?: None Help from another person taking care of personal grooming?: A Little Help from another person toileting, which includes using toliet, bedpan, or urinal?: A Little Help from another person bathing (including washing, rinsing, drying)?: A Lot Help from another person to put on and taking off regular upper body clothing?: A Little Help from another person to put on and taking off regular lower body clothing?: A  Lot 6 Click Score: 17    End of Session Equipment Utilized During Treatment: Gait belt;Rolling walker;Oxygen(Pt declined use of RW despite recommendations for use as he is more steady with use.)  OT Visit Diagnosis: Muscle weakness (generalized) (M62.81)   Activity Tolerance Patient tolerated treatment well;Patient limited by fatigue   Patient Left in chair;with call bell/phone within reach   Nurse Communication          Time: 1610-9604 OT Time Calculation (min): 27 min  Charges: OT General Charges $OT Visit: 1 Visit OT Treatments $Self Care/Home Management : 23-37 mins   Shuaib Corsino Beth Dixon, OTR/L 03/04/2018, 12:07 PM

## 2018-03-04 NOTE — Progress Notes (Signed)
Patient on 2 L oxygen via [place] at rest sat O2 98%; when walking on 2L, sat O2 is dropping on 85%-86%. On 4L oxygen Sat O2 is 93% on ambulation. Will continue to monitor.

## 2018-03-04 NOTE — Progress Notes (Signed)
Per MD patient will not be D/C home tonight.

## 2018-03-04 NOTE — Progress Notes (Signed)
SATURATION QUALIFICATIONS: (This note is used to comply with regulatory documentation for home oxygen)  Patient Saturations on Room Air at Rest = 91%  Patient Saturations on Room Air while Ambulating = 80%  Patient Saturations on 3 Liters of oxygen while Ambulating = 78%  Please briefly explain why patient needs home oxygen: Patient with SOB during ambulation. His Sat O2 dropped on 78% when ambulating on 3L oxygen via [place].

## 2018-03-04 NOTE — Progress Notes (Signed)
Inpatient Rehabilitation Admissions Coordinator  I have scheduled a peer to peer with Pecola Lawless MD and Dr. Riley Kill for today at 1315. I will follow up after that conversation.  Ottie Glazier, RN, MSN Rehab Admissions Coordinator [phone] 03/04/2018 8:14 AM

## 2018-03-04 NOTE — Care Management Note (Addendum)
Case Management Note  Patient Details  Name: Curtis Preston MRN: [id number] Date of Birth: [birth year]  Subjective/Objective:     Sepsis/ ARF.        Debby Laural [facility]) [place] (Sister)    [phone]      Action/Plan: Transition to home with sister today [address] [address]  Dianna(sister) will provide transportation to home. States it will be after 6pm - sister with doctors appointment today.  [place] to bring portable oxygen tank to beside for transportation to home, pending walking 02 sats. Nurse is aware.  [facility]   [phone] [address] [place] [address]    Next Steps: Go on 03/10/2018    Instructions: 9:00am with Dr.Wright, hospital follow up      Expected Discharge Date:  03/04/18               Expected Discharge Plan:  Home w Home Health Services(TBD)  In-House Referral:  NA  Discharge planning Services  CM Consult  Post Acute Care Choice:  NA Choice offered to:  Patient  DME Arranged:  Oxygen DME Agency:  Jaclyn Shaggy, [phone].  HH Arranged:  RN, PT HH Agency:  [facility]  Status of Service:  Completed, signed off  If discussed at Long Length of Stay Meetings, dates discussed:    Additional Comments:  Epifanio Lesches, RN 03/04/2018, 2:13 PM

## 2018-03-05 LAB — BASIC METABOLIC PANEL
Anion gap: 10 (ref 5–15)
BUN: 21 mg/dL (ref 8–23)
CO2: 23 mmol/L (ref 22–32)
Calcium: 7.8 mg/dL — ABNORMAL LOW (ref 8.9–10.3)
Chloride: 107 mmol/L (ref 98–111)
Creatinine, Ser: 0.79 mg/dL (ref 0.61–1.24)
GFR calc non Af Amer: >60 mL/min (ref >60)
Glucose, Bld: 87 mg/dL (ref 70–99)
Potassium: 4.1 mmol/L (ref 3.5–5.1)
SODIUM: 140 mmol/L (ref 135–145)

## 2018-03-05 LAB — MAGNESIUM: MAGNESIUM: 1.9 mg/dL (ref 1.7–2.4)

## 2018-03-05 MED ORDER — FUROSEMIDE 40 MG PO TABS: 40.0000 mg | ORAL_TABLET | Freq: Every day | ORAL | 0 refills | Status: AC

## 2018-03-05 MED ORDER — PREDNISONE 5 MG PO TABS: ORAL_TABLET | ORAL | 0 refills | Status: AC

## 2018-03-05 MED ORDER — FUROSEMIDE 10 MG/ML IJ SOLN
40.0000 mg | Freq: Once | INTRAMUSCULAR | Status: AC
  Administered 2018-03-05: 40 mg via INTRAVENOUS
  Filled 2018-03-05: qty 4

## 2018-03-05 MED ORDER — IPRATROPIUM-ALBUTEROL 20-100 MCG/ACT IN AERS: 1.0000 | INHALATION_SPRAY | Freq: Four times a day (QID) | RESPIRATORY_TRACT | 0 refills | Status: AC | PRN

## 2018-03-05 MED ORDER — POTASSIUM CHLORIDE ER 10 MEQ PO TBCR: 10.0000 meq | EXTENDED_RELEASE_TABLET | Freq: Every day | ORAL | 0 refills | Status: AC

## 2018-03-05 MED ORDER — IPRATROPIUM-ALBUTEROL 0.5-2.5 (3) MG/3ML IN SOLN: RESPIRATORY_TRACT | 0 refills | Status: AC

## 2018-03-05 MED ORDER — POTASSIUM CHLORIDE CRYS ER 20 MEQ PO TBCR
20.0000 meq | EXTENDED_RELEASE_TABLET | Freq: Once | ORAL | Status: AC
  Administered 2018-03-05: 20 meq via ORAL
  Filled 2018-03-05: qty 1

## 2018-03-05 MED FILL — FUROSEMIDE 40 MG TABLET: 40 | 30 days supply | Qty: 30 | Fill #0

## 2018-03-05 MED FILL — POTASSIUM CL ER 10 MEQ TAB: 10 | 30 days supply | Qty: 30 | Fill #0

## 2018-03-05 MED FILL — predniSONE 5 MG TABS: 5 | 18 days supply | Qty: 95 | Fill #0

## 2018-03-05 NOTE — Progress Notes (Signed)
Quintella Baton to be D/C'd Home per MD order.  Discussed prescriptions and follow up appointments with the patient. Prescriptions unable to fill at pharmacy here given to patient, medication list explained in detail. Pt verbalized understanding.  Allergies as of 03/05/2018      Reactions   Hydromorphone Other (See Comments)   hallucinations   Immune Globulin Other (See Comments)    Bilateral pulmonary embolism       Medication List    STOP taking these medications   baclofen 10 MG tablet Commonly known as:  LIORESAL   HYDROcodone-acetaminophen 5-325 MG tablet Commonly known as:  NORCO/VICODIN     TAKE these medications   ASPIRIN 81 81 MG EC tablet Generic drug:  aspirin Take 81 mg by mouth daily.   atorvastatin 40 MG tablet Commonly known as:  LIPITOR Take 40 mg by mouth at bedtime.   azathioprine 100 MG tablet Commonly known as:  IMURAN Take 200 mg by mouth at bedtime.   azelastine 0.1 % nasal spray Commonly known as:  ASTELIN Place 2 sprays into the nose 2 (two) times daily.   benzonatate 200 MG capsule Commonly known as:  TESSALON Take 1 capsule by mouth as needed for cough.   buPROPion 150 MG 12 hr tablet Commonly known as:  WELLBUTRIN SR Take 150 mg by mouth 2 (two) times daily.   Calcium Carb-Ergocalciferol 500-200 MG-UNIT Tabs Take 1 tablet by mouth daily.   fluticasone 50 MCG/ACT nasal spray Commonly known as:  FLONASE Place 1 spray into the nose 2 (two) times daily.   furosemide 40 MG tablet Commonly known as:  LASIX Take 1 tablet (40 mg total) by mouth daily.   guaiFENesin-dextromethorphan 100-10 MG/5ML syrup Commonly known as:  ROBITUSSIN DM Take 10 mLs by mouth 2 (two) times daily as needed for cough.   ICAPS AREDS 2 PO Take 1 tablet by mouth 2 (two) times daily.   ipratropium 0.03 % nasal spray Commonly known as:  ATROVENT Place 1 spray into both nostrils every 12 (twelve) hours.   ipratropium-albuterol 0.5-2.5 (3) MG/3ML  Soln Commonly known as:  DUONEB Use twice a day scheduled and every 4 hours as needed for shortness of breath and wheezing What changed:    how much to take  how to take this  when to take this  additional instructions   Ipratropium-Albuterol 20-100 MCG/ACT Aers respimat Commonly known as:  COMBIVENT Inhale 1 puff into the lungs every 6 (six) hours as needed for wheezing. What changed:  You were already taking a medication with the same name, and this prescription was added. Make sure you understand how and when to take each.   levothyroxine 175 MCG tablet Commonly known as:  SYNTHROID, LEVOTHROID Take 175 mcg by mouth daily before breakfast.   mirtazapine 15 MG tablet Commonly known as:  REMERON Take 30 mg by mouth at bedtime.   montelukast 10 MG tablet Commonly known as:  SINGULAIR Take 10 mg by mouth at bedtime.   morphine 15 MG 12 hr tablet Commonly known as:  MS CONTIN Take 15 mg by mouth at bedtime.   omeprazole 40 MG capsule Commonly known as:  PRILOSEC Take 40 mg by mouth 2 (two) times daily.   ondansetron 4 MG tablet Commonly known as:  ZOFRAN Take 4 mg by mouth every 6 (six) hours as needed for nausea or vomiting.   potassium chloride 10 MEQ tablet Commonly known as:  K-DUR Take 1 tablet (10 mEq total) by mouth  daily.   predniSONE 5 MG tablet Commonly known as:  DELTASONE Label  & dispense according to the schedule below. 10 Pills PO for 3 days then, 8 Pills PO for 3 days, 6 Pills PO for 3 days, 4 Pills PO for 3 days, 2 Pills PO for 3 days, 1 Pills PO for 3 days, 1/2 Pill  PO for 3 days then STOP. Total 95 pills.   pregabalin 50 MG capsule Commonly known as:  LYRICA Take 50 mg by mouth at bedtime.   pyridostigmine 60 MG tablet Commonly known as:  MESTINON Take 60 mg by mouth 4 (four) times daily.   rivaroxaban 20 MG Tabs tablet Commonly known as:  XARELTO Take 20 mg by mouth daily.   STIOLTO RESPIMAT 2.5-2.5 MCG/ACT Aers Generic drug:   Tiotropium Bromide-Olodaterol Inhale 2 puffs into the lungs daily.   venlafaxine 75 MG tablet Commonly known as:  EFFEXOR Take 225 mg by mouth daily.   vitamin B-12 1000 MCG tablet Commonly known as:  CYANOCOBALAMIN Take 1,000 mcg by mouth daily.            Durable Medical Equipment  (From admission, onward)         Start     Ordered   03/05/18 1205  For home use only DME 4 wheeled rolling walker with seat  Once    Question:  Patient needs a walker to treat with the following condition  Answer:  Weakness   03/05/18 1205   03/05/18 1205  For home use only DME 3 n 1  Once     03/05/18 1205   03/05/18 0549  For home use only DME Nebulizer/meds  Once    Question:  Patient needs a nebulizer to treat with the following condition  Answer:  COPD (chronic obstructive pulmonary disease) (HCC)   03/05/18 0548   03/04/18 1707  For home use only DME Nebulizer machine  Once    Question:  Patient needs a nebulizer to treat with the following condition  Answer:  PNA (pneumonia)   03/04/18 1707   03/04/18 1550  DME Oxygen  Once    Comments:  2 lits at rest, 4 Lit when ambulating  Question Answer Comment  Mode or (Route) Nasal cannula   Liters per Minute 4   Frequency Continuous (stationary and portable oxygen unit needed)   Oxygen conserving device Yes   Oxygen delivery system Gas      03/04/18 1550          Vitals:   03/05/18 0557 03/05/18 0835  BP: (!) 107/58   Pulse: 70   Resp: 20   Temp: 97.8 F (36.6 C)   SpO2: 100% 100%    Skin clean, dry and intact without evidence of skin break down, no evidence of skin tears noted. Pt denies pain at this time. No complaints noted. All equipment delivered to pt room (O2 tank, shower chair, rollator, nebulizer). Pt will be D/C to his sister's home but said she does not have access to a car at this time. Pt stated he would call for a cab and staff will be assisting him to vehicle.  An After Visit Summary was printed and given to the  patient. Patient escorted via WC, and D/C home via cab.  Fara Boros BSN, RN [employer] [facility] Phone [phone]

## 2018-03-05 NOTE — Progress Notes (Addendum)
Occupational Therapy Treatment and Discharge Patient Details Name: Curtis Preston MRN: [id number] DOB: December 15, 1950 Today's Date: 03/05/2018    History of present illness 67 yo admitted with SOB, multifocal PNA and sepsis. PMhx: myasthenia gravis, CoPD, PE, CAD Intubated 9/13 for hypoxia, Extubated 9/23. Remained in ICU until 9/26 for delrium    OT comments  This 67 yo male has been in the hospital almost a month. He presents to acute OT making progress with basic ADLs with biggest obstacle taking increased time for all tasks due to increased WOB with drop in O2 sats--has to turn O2 up with activity to maintain sats 92% or greater. In addition it he tends to first stand up with weight on his heels thus with posterior balance deficit. He will continue to benefit from follow up OT at home where he will be staying with his sister so that he can increase his safety and endurance for activity with him being hopeful he can return to [place] (where he resides) soon. No further acute OT needs due to pt to D/C home today so we will sign off.  Sitting in recliner pre-activity: sats 97% (2 liters), HR 95 Post walking to bathroom and sitting on toilet: sats 95% (2 liters); HR 115 Turned pt up to 3 liters since getting ready to shower and increased HR End of shower (he sat down for whole shower) sats 93% (3 liters); HR 120 Back over to recliner post shower sats down to 88% (encouraged purse lipped breathing and pt up to 93% on 3 liters with HR ranging 119-127)   Follow Up Recommendations  Home health OT;Supervision/Assistance - 24 hour  (still feel like CIR would be better for him but Insurance denied)   Equipment Recommendations  3 in 1 bedside commode;Other (comment)(rollator)       Precautions / Restrictions Precautions Precautions: Fall Precaution Comments: deconditioned, monitor O2 sats  Restrictions Weight Bearing Restrictions: No       Mobility Bed Mobility               General  bed mobility comments: pt OOB in chair upon arrival  Transfers Overall transfer level: Needs assistance Equipment used: None Transfers: Sit to/from Stand Sit to Stand: Min guard Stand pivot transfers: Min guard       General transfer comment: Pt not wanting to use RW in room     Balance Overall balance assessment: Needs assistance Sitting-balance support: Feet supported;No upper extremity supported Sitting balance-Leahy Scale: Good     Standing balance support: Single extremity supported;During functional activity Standing balance-Leahy Scale: Poor Standing balance comment: has a tendency to be on his heels when he first stands up thus at times throwing him off balance posteriorly                           ADL either performed or assessed with clinical judgement   ADL Overall ADL's : Needs assistance/impaired         Upper Body Bathing: Set up;Supervision/ safety;Sitting Upper Body Bathing Details (indicate cue type and reason): Pt becomes very SOB with seated activity, Lower Body Bathing: Supervison/ safety;Set up Lower Body Bathing Details (indicate cue type and reason): min guard A sit<>stand Upper Body Dressing : Supervision/safety;Set up;Sitting   Lower Body Dressing: Supervision/safety;Set up Lower Body Dressing Details (indicate cue type and reason): min guard A sit<>stand Toilet Transfer: Supervision/safety;Ambulation;Regular Toilet;Grab bars   Toileting- Clothing Manipulation and Hygiene: Supervision/safety;Sit to/from stand  General ADL Comments: Pt requires VCs to take activities slow and steady to stop and focus on breathing. Spoke with pt before session even started that he needs to pace himself and use purse lipped breathing prn when he gets SOB (reps, see how feels, and repeat until he feels better with his breathing). Pt has to rest/purse lip breath during and post showering (did this this session) to get breathing better before he can  proceed with dressing     Vision Baseline Vision/History: Wears glasses Wears Glasses: Reading only Patient Visual Report: Central vision impairment            Cognition Arousal/Alertness: Awake/alert Behavior During Therapy: WFL for tasks assessed/performed Overall Cognitive Status: Impaired/Different from baseline Area of Impairment: Safety/judgement                         Safety/Judgement: Decreased awareness of safety     General Comments: Still requires VCs to slow down for safety                   Pertinent Vitals/ Pain       Pain Score: 2  Pain Location: behind ears (O2 tubing) Pain Descriptors / Indicators: (raw, chaffing) Pain Intervention(s): Limited activity within patient's tolerance;Monitored during session;Repositioned         Frequency  Min 2X/week        Progress Toward Goals  OT Goals(current goals can now be found in the care plan section)  Progress towards OT goals: Progressing toward goals     Plan Discharge plan needs to be updated       AM-PAC PT "6 Clicks" Daily Activity     Outcome Measure   Help from another person eating meals?: None Help from another person taking care of personal grooming?: A Little Help from another person toileting, which includes using toliet, bedpan, or urinal?: A Little Help from another person bathing (including washing, rinsing, drying)?: A Little Help from another person to put on and taking off regular upper body clothing?: A Little Help from another person to put on and taking off regular lower body clothing?: A Little 6 Click Score: 19    End of Session Equipment Utilized During Treatment: Gait belt;Oxygen(2 liters at rest, 3 liters with activity)  OT Visit Diagnosis: Muscle weakness (generalized) (M62.81);Unsteadiness on feet (R26.81)   Activity Tolerance Other (comment)(it takes pt increased time for all activities due to increased work of breathing with any activity)   Patient  Left in chair;with call bell/phone within reach   Nurse Communication (spoke with RN and no chair alarm, pt agrees to call if he needs to get up)        Time: 1610-9604 OT Time Calculation (min): 75 min  Charges: OT General Charges $OT Visit: 1 Visit OT Treatments $Self Care/Home Management : 68-82 mins

## 2018-03-05 NOTE — Progress Notes (Signed)
OT Cancellation Note  Patient Details Name: Aleph Nickson MRN: [id number] DOB: 04/13/1951   Cancelled Treatment:    Reason Eval/Treat Not Completed: Other (comment). Pt just got breakfast, will try back as schedule allows.   Evette Georges [phone] 03/05/2018, 9:25 AM

## 2018-03-05 NOTE — Progress Notes (Signed)
Patient is alert and oriented. Provide his wallet with cash $254.00 and credit cards  to nurse for safe keeping.  The amount and all cards in wallet reviewed with patient and signed by the patient. Sealed in patient valuable envelop and  taken down to security for safe keeping.

## 2018-03-06 LAB — CULTURE, FUNGUS WITHOUT SMEAR

## 2018-03-10 ENCOUNTER — Telehealth: Payer: Self-pay

## 2018-03-10 ENCOUNTER — Encounter: Payer: Self-pay | Admitting: Critical Care Medicine

## 2018-03-10 ENCOUNTER — Ambulatory Visit: Payer: Medicare HMO | Attending: Critical Care Medicine | Admitting: Critical Care Medicine

## 2018-03-10 VITALS — BP 122/83 | HR 92 | Temp 97.7°F | Resp 24 | Ht 73.0 in | Wt 154.0 lb

## 2018-03-10 DIAGNOSIS — M3502 Sicca syndrome with lung involvement: Secondary | ICD-10-CM

## 2018-03-10 DIAGNOSIS — R6521 Severe sepsis with septic shock: Secondary | ICD-10-CM

## 2018-03-10 DIAGNOSIS — I2510 Atherosclerotic heart disease of native coronary artery without angina pectoris: Secondary | ICD-10-CM

## 2018-03-10 DIAGNOSIS — J189 Pneumonia, unspecified organism: Secondary | ICD-10-CM | POA: Diagnosis not present

## 2018-03-10 DIAGNOSIS — J9601 Acute respiratory failure with hypoxia: Secondary | ICD-10-CM

## 2018-03-10 DIAGNOSIS — D709 Neutropenia, unspecified: Secondary | ICD-10-CM

## 2018-03-10 DIAGNOSIS — E785 Hyperlipidemia, unspecified: Secondary | ICD-10-CM | POA: Insufficient documentation

## 2018-03-10 DIAGNOSIS — E440 Moderate protein-calorie malnutrition: Secondary | ICD-10-CM | POA: Insufficient documentation

## 2018-03-10 DIAGNOSIS — J9621 Acute and chronic respiratory failure with hypoxia: Secondary | ICD-10-CM | POA: Insufficient documentation

## 2018-03-10 DIAGNOSIS — I253 Aneurysm of heart: Secondary | ICD-10-CM | POA: Insufficient documentation

## 2018-03-10 DIAGNOSIS — Z955 Presence of coronary angioplasty implant and graft: Secondary | ICD-10-CM | POA: Insufficient documentation

## 2018-03-10 DIAGNOSIS — Z87891 Personal history of nicotine dependence: Secondary | ICD-10-CM | POA: Insufficient documentation

## 2018-03-10 DIAGNOSIS — E039 Hypothyroidism, unspecified: Secondary | ICD-10-CM | POA: Insufficient documentation

## 2018-03-10 DIAGNOSIS — Z86718 Personal history of other venous thrombosis and embolism: Secondary | ICD-10-CM | POA: Insufficient documentation

## 2018-03-10 DIAGNOSIS — Z86711 Personal history of pulmonary embolism: Secondary | ICD-10-CM | POA: Insufficient documentation

## 2018-03-10 DIAGNOSIS — G7000 Myasthenia gravis without (acute) exacerbation: Secondary | ICD-10-CM | POA: Insufficient documentation

## 2018-03-10 DIAGNOSIS — M3500 Sicca syndrome, unspecified: Secondary | ICD-10-CM | POA: Insufficient documentation

## 2018-03-10 DIAGNOSIS — I5033 Acute on chronic diastolic (congestive) heart failure: Secondary | ICD-10-CM | POA: Insufficient documentation

## 2018-03-10 DIAGNOSIS — Z8249 Family history of ischemic heart disease and other diseases of the circulatory system: Secondary | ICD-10-CM | POA: Insufficient documentation

## 2018-03-10 DIAGNOSIS — J80 Acute respiratory distress syndrome: Secondary | ICD-10-CM

## 2018-03-10 DIAGNOSIS — Z79899 Other long term (current) drug therapy: Secondary | ICD-10-CM | POA: Insufficient documentation

## 2018-03-10 DIAGNOSIS — Z79890 Hormone replacement therapy (postmenopausal): Secondary | ICD-10-CM | POA: Insufficient documentation

## 2018-03-10 DIAGNOSIS — Z885 Allergy status to narcotic agent status: Secondary | ICD-10-CM | POA: Insufficient documentation

## 2018-03-10 DIAGNOSIS — J440 Chronic obstructive pulmonary disease with acute lower respiratory infection: Secondary | ICD-10-CM | POA: Insufficient documentation

## 2018-03-10 DIAGNOSIS — Z7901 Long term (current) use of anticoagulants: Secondary | ICD-10-CM | POA: Insufficient documentation

## 2018-03-10 DIAGNOSIS — F418 Other specified anxiety disorders: Secondary | ICD-10-CM | POA: Insufficient documentation

## 2018-03-10 DIAGNOSIS — I2699 Other pulmonary embolism without acute cor pulmonale: Secondary | ICD-10-CM | POA: Insufficient documentation

## 2018-03-10 DIAGNOSIS — Z720 Tobacco use: Secondary | ICD-10-CM

## 2018-03-10 DIAGNOSIS — Z7982 Long term (current) use of aspirin: Secondary | ICD-10-CM | POA: Insufficient documentation

## 2018-03-10 DIAGNOSIS — J432 Centrilobular emphysema: Secondary | ICD-10-CM | POA: Diagnosis not present

## 2018-03-10 DIAGNOSIS — K219 Gastro-esophageal reflux disease without esophagitis: Secondary | ICD-10-CM | POA: Insufficient documentation

## 2018-03-10 DIAGNOSIS — A419 Sepsis, unspecified organism: Secondary | ICD-10-CM | POA: Insufficient documentation

## 2018-03-10 DIAGNOSIS — A408 Other streptococcal sepsis: Secondary | ICD-10-CM

## 2018-03-10 DIAGNOSIS — I951 Orthostatic hypotension: Secondary | ICD-10-CM

## 2018-03-10 MED ORDER — FLUTTER DEVI: 0 refills | Status: AC

## 2018-03-10 NOTE — Assessment & Plan Note (Signed)
Sepsis with strep viridans bacteremia now resolved No further antibiotics indicated

## 2018-03-10 NOTE — Progress Notes (Signed)
Follow up: Sepsis and Respiratory Failure Concerns about ECHO Unsure what was done while in hospital.  Cough, productive, yellow thick phlegm

## 2018-03-10 NOTE — Assessment & Plan Note (Signed)
Chronic hypoxic respiratory failure exacerbated by recent pneumonia and flare of interstitial lung disease Plan Maintain oxygen 2 L rest 3 L exertion

## 2018-03-10 NOTE — Assessment & Plan Note (Signed)
History of tobacco use currently in remission

## 2018-03-10 NOTE — Assessment & Plan Note (Signed)
The patient continues to have dry mouth and did have positive SSA antibodies with negative lip biopsy We will continue chronic steroid taper for interstitial lung disease which may have been related to sjogren syndrome

## 2018-03-10 NOTE — Assessment & Plan Note (Signed)
Moderate malnutrition Continue nutrition therapy

## 2018-03-10 NOTE — Assessment & Plan Note (Signed)
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

## 2018-03-10 NOTE — Assessment & Plan Note (Signed)
Orthostatic hypotension has resolved 

## 2018-03-10 NOTE — Assessment & Plan Note (Signed)
Reflux disease on proton pump inhibitor

## 2018-03-10 NOTE — Telephone Encounter (Signed)
Call placed to [employer] # [phone] spoke to [facility] who stated that they have flutter devices in stock. She requested an order and provider visit notes be faxed to # [phone]. She said that they will call the patient when it is ready for pick up and it should be ready today. Patient's phone # confirmed with Gilda.   Order and visit notes faxed to above noted fax. The patient was provided the contact information for [employer]. He said that the respiratory therapist is coming to see him today and he will ask the RT to bring to him.  He also explained that he receives PT from [facility].

## 2018-03-10 NOTE — Assessment & Plan Note (Signed)
Acute respiratory failure with hypoxemia is now resolved

## 2018-03-10 NOTE — Assessment & Plan Note (Signed)
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

## 2018-03-10 NOTE — Assessment & Plan Note (Addendum)
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

## 2018-03-10 NOTE — Progress Notes (Addendum)
Subjective:    Patient ID: Curtis Preston, male    DOB: 1950-06-24, 67 y.o.   MRN: [id number]  Patient is a 67 y.o. male with history of myasthenia gravis on Imuran, PE on Xarelto, COPD not on home O2 who originally is from [place] and is here in town for his brother-in-law's funeral, he developed worsening shortness of breath and cough (started since August but worsened acutely)-he subsequently presented to the ED and was found to have pneumonia and started on empiric antibiotics.  Ultimately patient worsened and required intubation on 9/13.  While in the ICU, patient underwent bronchoscopy with negative BAL.  Upon clinical stability he was extubated and transferred to the trial hospitalist service on 9/26.  Hospital course has been complicated by ICU delirium, severe deconditioning and ongoing hypoxia but gradually improving.  See below for further details  Acute hypoxic respiratory failure secondary to multifocal pneumonia and now evidence of acute on chronic diastolic heart failure EF 55 to 60%: Patient had strep pneumonia bacteremia under the care of ICU team and has finished antibiotics for his infectious process.   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  Currently qualifies for oxygen 2 L nasal cannula which she will get, continue slow steroid taper, have advised to follow with pulmonary locally if he stays in town in the next 7 to 10 days.  Leukocytosis: Likely secondary to steroids-slowly improving-completed a course of antimicrobial therapy. Follow.  Myasthenia gravis:Seen by neurology-not felt to have myasthenic crisis-Imuran remains on hold. Continue Mestinon.  COPD: Not Wheezing-moving air well-continue bronchodilators  Hypothyroidism: Continue Synthroid  History of pulmonary embolism:Continue rivaroxaban.  ICU delirium: Resolved  Severe deconditioning:Continue PT, CIR if approved or else sisters home with home  health PT and RN.  03/10/2018 Here for post hosp f/u.  Pt on oxygen. Did not go to CIR. Staying at [facility] home. Prior hx of PNA, COPD, ILD.  Never on oxygen. Hx of frequent PNA. Dr Jess Barters is Lung MD in [facility] in [place].  Tested for Sjorgren, s  Lip bx neg for SSAs neg.  Wants to travel back to [place].   The patient's level of dyspnea has improved to some degree his weakness has improved as well.  He is receiving home health nurse and home physical therapy.  He does desire to return back to [place] soon.  He has a stable home environment and does have a pulmonary physician in the [place] system in [place] [facility] [place]  This is a chronic problem. The current episode started more than 1 year ago. The problem has been gradually improving (notes more stamina.  lungs feel better). Associated symptoms include leg pain, sputum production and wheezing. Pertinent negatives include no abdominal pain, chest pain, ear pain, fever, headaches, hemoptysis or leg swelling. The symptoms are aggravated by odors, lying flat, weather changes, pollens, exercise and any activity. Associated symptoms comments: Mucus now is yellow sticky to now sl yellow Hard to expectorate mucus.  No flutter valve. Muscle weakness noted Notes slight nausea. Risk factors include smoking. He has tried beta agonist inhalers for the symptoms. The treatment provided moderate relief. His past medical history is significant for allergies, CAD, chronic lung disease, COPD, DVT, a heart failure, PE and pneumonia.   Past Medical History:  Diagnosis Date  . Bronchiectasis ([place])   . CAD (coronary artery disease)   . COPD (chronic obstructive pulmonary disease) ([place])   .  Depression with anxiety   . GERD (gastroesophageal reflux disease)   . Hypothyroidism   . Myasthenia gravis ([place])   . PE (pulmonary thromboembolism) ([place])   . Sjogren's disease ([place])   . Tobacco abuse      Family History  Problem Relation Age of Onset  .  Heart attack Father      Social History   Socioeconomic History  . Marital status: Single    Spouse name: Not on file  . Number of children: 2  . Years of education: Not on file  . Highest education level: Not on file  Occupational History  . Not on file  Social Needs  . Financial resource strain: Patient refused  . Food insecurity:    Worry: Patient refused    Inability: Patient refused  . Transportation needs:    Medical: Patient refused    Non-medical: Patient refused  Tobacco Use  . Smoking status: Former Smoker    Packs/day: 0.50    Types: Cigarettes  . Smokeless tobacco: Never Used  Substance and Sexual Activity  . Alcohol use: Yes    Comment: occasional  . Drug use: Never  . Sexual activity: Not on file  Lifestyle  . Physical activity:    Days per week: Not on file    Minutes per session: Not on file  . Stress: Not on file  Relationships  . Social connections:    Talks on phone: Patient refused    Gets together: Patient refused    Attends religious service: Patient refused    Active member of club or organization: Patient refused    Attends meetings of clubs or organizations: Patient refused    Relationship status: Patient refused  . Intimate partner violence:    Fear of current or ex partner: Patient refused    Emotionally abused: Patient refused    Physically abused: Patient refused    Forced sexual activity: Patient refused  Other Topics Concern  . Not on file  Social History Narrative  . Not on file     Allergies  Allergen Reactions  . Hydromorphone Other (See Comments)    hallucinations   . Immune Globulin Other (See Comments)     Bilateral pulmonary embolism       Outpatient Medications Prior to Visit  Medication Sig Dispense Refill  . aspirin (ASPIRIN 81) 81 MG EC tablet Take 81 mg by mouth daily.    Marland Kitchen atorvastatin (LIPITOR) 40 MG tablet Take 40 mg by mouth at bedtime.    Marland Kitchen azelastine (ASTELIN) 0.1 % nasal spray Place 2 sprays into  the nose 2 (two) times daily.    Marland Kitchen buPROPion (WELLBUTRIN SR) 150 MG 12 hr tablet Take 150 mg by mouth 2 (two) times daily.    . Calcium Carb-Ergocalciferol 500-200 MG-UNIT TABS Take 1 tablet by mouth daily.    . fluticasone (FLONASE) 50 MCG/ACT nasal spray Place 1 spray into the nose 2 (two) times daily.    . furosemide (LASIX) 40 MG tablet Take 1 tablet (40 mg total) by mouth daily. 30 tablet 0  . guaiFENesin (MUCINEX) 600 MG 12 hr tablet Take by mouth 2 (two) times daily.    Marland Kitchen ipratropium (ATROVENT) 0.03 % nasal spray Place 1 spray into both nostrils every 12 (twelve) hours.    Marland Kitchen ipratropium-albuterol (DUONEB) 0.5-2.5 (3) MG/3ML SOLN Use twice a day scheduled and every 4 hours as needed for shortness of breath and wheezing 360 mL 0  . levothyroxine (SYNTHROID, LEVOTHROID) 175 MCG  tablet Take 175 mcg by mouth daily before breakfast.    . mirtazapine (REMERON) 15 MG tablet Take 30 mg by mouth at bedtime.    . montelukast (SINGULAIR) 10 MG tablet Take 10 mg by mouth at bedtime.    Marland Kitchen morphine (MS CONTIN) 15 MG 12 hr tablet Take 15 mg by mouth at bedtime.  0  . Multiple Vitamins-Minerals (ICAPS AREDS 2 PO) Take 1 tablet by mouth 2 (two) times daily.    Marland Kitchen omeprazole (PRILOSEC) 40 MG capsule Take 40 mg by mouth 2 (two) times daily.    . ondansetron (ZOFRAN) 4 MG tablet Take 4 mg by mouth every 6 (six) hours as needed for nausea or vomiting.     . potassium chloride (K-DUR) 10 MEQ tablet Take 1 tablet (10 mEq total) by mouth daily. 30 tablet 0  . predniSONE (DELTASONE) 5 MG tablet Label  & dispense according to the schedule below. 10 Pills PO for 3 days then, 8 Pills PO for 3 days, 6 Pills PO for 3 days, 4 Pills PO for 3 days, 2 Pills PO for 3 days, 1 Pills PO for 3 days, 1/2 Pill  PO for 3 days then STOP. Total 95 pills. 95 tablet 0  . pregabalin (LYRICA) 50 MG capsule Take 50 mg by mouth at bedtime.    . pyridostigmine (MESTINON) 60 MG tablet Take 60 mg by mouth 4 (four) times daily.     . rivaroxaban  (XARELTO) 20 MG TABS tablet Take 20 mg by mouth daily.    . Tiotropium Bromide-Olodaterol (STIOLTO RESPIMAT) 2.5-2.5 MCG/ACT AERS Inhale 2 puffs into the lungs daily.    Marland Kitchen venlafaxine (EFFEXOR) 75 MG tablet Take 225 mg by mouth daily.    . vitamin B-12 (CYANOCOBALAMIN) 1000 MCG tablet Take 1,000 mcg by mouth daily.    Marland Kitchen azathioprine (IMURAN) 100 MG tablet Take 200 mg by mouth at bedtime.    . benzonatate (TESSALON) 200 MG capsule Take 1 capsule by mouth as needed for cough.    Marland Kitchen guaiFENesin-dextromethorphan (ROBITUSSIN DM) 100-10 MG/5ML syrup Take 10 mLs by mouth 2 (two) times daily as needed for cough.     . Ipratropium-Albuterol (COMBIVENT RESPIMAT) 20-100 MCG/ACT AERS respimat Inhale 1 puff into the lungs every 6 (six) hours as needed for wheezing. 1 Inhaler 0   No facility-administered medications prior to visit.       Review of Systems  Constitutional: Negative for fever.  HENT: Negative for ear pain.   Respiratory: Positive for sputum production, shortness of breath and wheezing. Negative for hemoptysis.   Cardiovascular: Negative for chest pain and leg swelling.  Gastrointestinal: Negative for abdominal pain.  Neurological: Negative for headaches.       Objective:   Physical Exam Vitals:   03/10/18 0910  BP: 122/83  Pulse: 92  Resp: (!) 24  Temp: 97.7 F (36.5 C)  TempSrc: Oral  SpO2: 96%  Weight: 154 lb (69.9 kg)  Height: _0  (1.854 m)    Gen: Thin white male on nasal oxygen, in no distress,  normal affect  ENT: No lesions,  mouth clear,  oropharynx clear, no postnasal drip  Neck: No JVD, no TMG, no carotid bruits  Lungs: No use of accessory muscles, no dullness to percussion, distant breath sounds with bibasilar dry rales  Cardiovascular: RRR, heart sounds normal, no murmur or gallops, no peripheral edema  Abdomen: soft and NT, no HSM,  BS normal  Musculoskeletal: No deformities, no cyanosis or clubbing  Neuro:  alert, non focal  Skin: Warm, no lesions  or rashes  No results found. Echo 02/2018 Study Conclusions  - Left ventricle: The cavity size was normal. Wall thickness was   normal. Systolic function was normal. The estimated ejection   fraction was in the range of 55% to 60%. Wall motion was normal;   there were no regional wall motion abnormalities. Doppler   parameters are consistent with abnormal left ventricular   relaxation (grade 1 diastolic dysfunction). - Atrial septum: There was an atrial septal aneurysm.  Impressions:  - Normal LV systolic function; mild diastolic dysfunction.  ------------------------------------------------------------------- Left ventricle:  The cavity size was normal. Wall thickness was normal. Systolic function was normal. The estimated ejection fraction was in the range of 55% to 60%. Wall motion was normal; there were no regional wall motion abnormalities. Doppler parameters are consistent with abnormal left ventricular relaxation (grade 1 diastolic dysfunction).  CT chest 02/18/18: IMPRESSION: Significantly increased airspace opacities are noted throughout both lungs consistent with severely worsening pneumonia. Increased mediastinal adenopathy is also noted most likely is inflammatory in etiology.     Assessment & Plan:  I personally reviewed all images and lab data in the [facility] system as well as any outside material available during this office visit and agree with the  radiology impressions.   CAD (coronary artery disease) History of coronary artery disease with stenting Plan Maintain lipid therapy  PE (pulmonary thromboembolism) (Early) History of pulmonary embolism with deep venous thrombosis now on rivaroxaban for life Plan Continue rivaroxaban daily  Orthostatic hypotension Orthostatic hypotension has resolved  COPD (chronic obstructive pulmonary disease) (HCC) Chronic obstructive lung disease with centrilobular emphysema and chronic bronchitic component stable at this  time Plan Maintain current inhaled medications as prescribed Maintain oxygen 2 L rest 3 L exertion I will obtain a flutter valve for this patient to use 4 times daily in order to mobilize secretions  Multifocal pneumonia Multifocal pneumonia with acute lung injury exacerbating 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 Pneumonia now appears to be resolving no further antibiotics are needed Plan Continued observation  Respiratory failure ([place]) Chronic hypoxic respiratory failure exacerbated by recent pneumonia and flare of interstitial lung disease Plan Maintain oxygen 2 L rest 3 L exertion  Acute respiratory failure with hypoxemia (HCC) Acute respiratory failure with hypoxemia is now resolved  ARDS (adult respiratory distress syndrome) (HCC) Adult respiratory distress syndrome now resolved  GERD (gastroesophageal reflux disease) Reflux disease on proton pump inhibitor  Sjogren's syndrome ([place]) The patient continues to have dry mouth and did have positive SSA antibodies with negative lip biopsy We will continue chronic steroid taper for interstitial lung disease which may have been related to sjogren syndrome  Myasthenia gravis ([place]) History of myasthenia gravis stable at this time Plan Maintain Mestinon Hold Imuran for now  Tobacco abuse History of tobacco use currently in remission  Malnutrition of moderate degree Moderate malnutrition Continue nutrition therapy  Sepsis ([place]) Sepsis with strep viridans bacteremia now resolved No further antibiotics indicated  Leucopenia Leukopenia appears to have resolved with recent CBC showing normal white count   Faizan was seen today for hospitalization follow-up.  Diagnoses and all orders for this visit:  Acute respiratory failure with hypoxemia (HCC)  ARDS (adult respiratory distress syndrome) (HCC)  Centrilobular emphysema (HCC)  Multifocal pneumonia  Malnutrition of moderate  degree  Sepsis due to other Streptococcus species with acute hypoxic respiratory failure and septic shock (HCC)  Hypothyroidism, unspecified type  Coronary artery  disease involving native coronary artery of native heart without angina pectoris  PE (pulmonary thromboembolism) (HCC)  Orthostatic hypotension  Acute respiratory failure with hypoxia (HCC)  Gastroesophageal reflux disease without esophagitis  Sjogren's syndrome with lung involvement (HCC)  Myasthenia gravis (HCC)  Tobacco abuse  Neutropenia, unspecified type ([place])  Other orders -     Respiratory Therapy Supplies (FLUTTER) DEVI; Use 4 times daily

## 2018-03-10 NOTE — Assessment & Plan Note (Signed)
History of coronary artery disease with stenting Plan Maintain lipid therapy

## 2018-03-10 NOTE — Assessment & Plan Note (Signed)
Adult respiratory distress syndrome now resolved

## 2018-03-10 NOTE — Assessment & Plan Note (Signed)
Leukopenia appears to have resolved with recent CBC showing normal white count

## 2018-03-10 NOTE — Patient Instructions (Addendum)
Stay on prednisone for now with a very slow taper per prescription Continue to hold Imuran Continue inhaled medications as per prescribed Continue oxygen therapy 2L rest, 3L exertion Continue to receive physical therapy assessment in the home No other medication changes A flutter valve was prescribed Return one week follow up for recheck 03/17/18

## 2018-03-10 NOTE — Assessment & Plan Note (Signed)
History of myasthenia gravis stable at this time Plan Maintain Mestinon Hold Imuran for now

## 2018-03-11 ENCOUNTER — Telehealth: Payer: Self-pay

## 2018-03-11 NOTE — Telephone Encounter (Signed)
Call placed to [employer] to confirm that the patient received the flutter device. Spoke to Lexi who was not in the [place] office.  She said that the [facility] calls were forwarded to the main # and she did not have any information about delivery/pick up  of the device Call placed to patient # [phone] to confirm he received the device. Message left requesting a call back to # [phone]

## 2018-03-13 ENCOUNTER — Telehealth: Payer: Self-pay

## 2018-03-13 NOTE — Telephone Encounter (Signed)
Spoke to Tiffany with [employer] to confirm that the patient received his flutter device. She stated the patient said that he didn't need one.He said that he has one at home that the [place] paid for and he  didn't want to pay $10.91 to purchase another one. He said that he would wait until he gets home to use it.

## 2018-03-13 NOTE — Telephone Encounter (Signed)
I understand and am comfortable with this plan

## 2018-03-18 ENCOUNTER — Ambulatory Visit: Payer: Medicare HMO | Attending: Critical Care Medicine | Admitting: Critical Care Medicine

## 2018-03-18 ENCOUNTER — Encounter: Payer: Self-pay | Admitting: Critical Care Medicine

## 2018-03-18 VITALS — BP 112/75 | HR 114 | Temp 97.8°F | Resp 18 | Ht 73.0 in | Wt 158.0 lb

## 2018-03-18 DIAGNOSIS — Z86711 Personal history of pulmonary embolism: Secondary | ICD-10-CM | POA: Diagnosis not present

## 2018-03-18 DIAGNOSIS — M3500 Sicca syndrome, unspecified: Secondary | ICD-10-CM | POA: Insufficient documentation

## 2018-03-18 DIAGNOSIS — Z79899 Other long term (current) drug therapy: Secondary | ICD-10-CM | POA: Insufficient documentation

## 2018-03-18 DIAGNOSIS — Z716 Tobacco abuse counseling: Secondary | ICD-10-CM | POA: Insufficient documentation

## 2018-03-18 DIAGNOSIS — F418 Other specified anxiety disorders: Secondary | ICD-10-CM | POA: Diagnosis not present

## 2018-03-18 DIAGNOSIS — Z79890 Hormone replacement therapy (postmenopausal): Secondary | ICD-10-CM | POA: Diagnosis not present

## 2018-03-18 DIAGNOSIS — K219 Gastro-esophageal reflux disease without esophagitis: Secondary | ICD-10-CM | POA: Insufficient documentation

## 2018-03-18 DIAGNOSIS — Z7982 Long term (current) use of aspirin: Secondary | ICD-10-CM | POA: Diagnosis not present

## 2018-03-18 DIAGNOSIS — I2699 Other pulmonary embolism without acute cor pulmonale: Secondary | ICD-10-CM

## 2018-03-18 DIAGNOSIS — J9611 Chronic respiratory failure with hypoxia: Secondary | ICD-10-CM

## 2018-03-18 DIAGNOSIS — E039 Hypothyroidism, unspecified: Secondary | ICD-10-CM | POA: Insufficient documentation

## 2018-03-18 DIAGNOSIS — D72829 Elevated white blood cell count, unspecified: Secondary | ICD-10-CM | POA: Insufficient documentation

## 2018-03-18 DIAGNOSIS — Z885 Allergy status to narcotic agent status: Secondary | ICD-10-CM | POA: Diagnosis not present

## 2018-03-18 DIAGNOSIS — E440 Moderate protein-calorie malnutrition: Secondary | ICD-10-CM | POA: Insufficient documentation

## 2018-03-18 DIAGNOSIS — Z8249 Family history of ischemic heart disease and other diseases of the circulatory system: Secondary | ICD-10-CM | POA: Insufficient documentation

## 2018-03-18 DIAGNOSIS — Z87891 Personal history of nicotine dependence: Secondary | ICD-10-CM | POA: Diagnosis not present

## 2018-03-18 DIAGNOSIS — I5033 Acute on chronic diastolic (congestive) heart failure: Secondary | ICD-10-CM | POA: Insufficient documentation

## 2018-03-18 DIAGNOSIS — J432 Centrilobular emphysema: Secondary | ICD-10-CM | POA: Insufficient documentation

## 2018-03-18 DIAGNOSIS — G7000 Myasthenia gravis without (acute) exacerbation: Secondary | ICD-10-CM | POA: Insufficient documentation

## 2018-03-18 DIAGNOSIS — Z7901 Long term (current) use of anticoagulants: Secondary | ICD-10-CM | POA: Diagnosis not present

## 2018-03-18 DIAGNOSIS — I2510 Atherosclerotic heart disease of native coronary artery without angina pectoris: Secondary | ICD-10-CM | POA: Diagnosis not present

## 2018-03-18 DIAGNOSIS — Z720 Tobacco use: Secondary | ICD-10-CM

## 2018-03-18 DIAGNOSIS — J9621 Acute and chronic respiratory failure with hypoxia: Secondary | ICD-10-CM | POA: Diagnosis not present

## 2018-03-18 DIAGNOSIS — J189 Pneumonia, unspecified organism: Secondary | ICD-10-CM | POA: Diagnosis present

## 2018-03-18 NOTE — Assessment & Plan Note (Signed)
Malnutrition improved at this time

## 2018-03-18 NOTE — Assessment & Plan Note (Addendum)
Chronic obstructive lung disease with chronic bronchitic component stable at this time Continue current inhaled medications Continue oxygen therapy as prescribed Continue slow prednisone taper

## 2018-03-18 NOTE — Assessment & Plan Note (Addendum)
Chronic respiratory failure stable at this time Continue oxygen therapy Note ongoing tobacco use exacerbates the patients lung disease.

## 2018-03-18 NOTE — Assessment & Plan Note (Signed)
Myasthenia gravis stable at this time

## 2018-03-18 NOTE — Patient Instructions (Signed)
Continue all medications currently as prescribed Continue prednisone taper as prescribed Documents will be sent with you describing her recent visits Letter is sent with you describing my opinion on your immunoglobulin relationship to thromboembolism Please make appointments with your pulmonary and primary physicians in the [place] system upon arrival to your home in [place]

## 2018-03-18 NOTE — Assessment & Plan Note (Addendum)
Tobacco use now improved  Ongoing tobacco counseling provided.  The patient remains symptomatic from his lung disease.

## 2018-03-18 NOTE — Progress Notes (Addendum)
Subjective:    Patient ID: Curtis Preston, male    DOB: [birth year], 67 y.o.   MRN: [id number]  Patient is a 67 y.o. male with history of myasthenia gravis on Imuran, PE on Xarelto, COPD not on home O2 who originally is from [place] and is here in town for his brother-in-law's funeral, he developed worsening shortness of breath and cough (started since August but worsened acutely)-he subsequently presented to the ED and was found to have pneumonia and started on empiric antibiotics.  Ultimately patient worsened and required intubation on 9/13.  While in the ICU, patient underwent bronchoscopy with negative BAL.  Upon clinical stability he was extubated and transferred to the trial hospitalist service on 9/26.  Hospital course has been complicated by ICU delirium, severe deconditioning and ongoing hypoxia but gradually improving.  See below for further details  Acute hypoxic respiratory failure secondary to multifocal pneumonia and now evidence of acute on chronic diastolic heart failure EF 55 to 60%: Patient had strep pneumonia bacteremia under the care of ICU team and has finished antibiotics for his infectious process.   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  Currently qualifies for oxygen 2 L nasal cannula which she will get, continue slow steroid taper, have advised to follow with pulmonary locally if he stays in town in the next 7 to 10 days.  Leukocytosis: Likely secondary to steroids-slowly improving-completed a course of antimicrobial therapy. Follow.  Myasthenia gravis:Seen by neurology-not felt to have myasthenic crisis-Imuran remains on hold. Continue Mestinon.  COPD: Not Wheezing-moving air well-continue bronchodilators  Hypothyroidism: Continue Synthroid  History of pulmonary embolism:Continue rivaroxaban.  ICU delirium: Resolved  Severe deconditioning:Continue PT, CIR if approved or else sisters home with home  health PT and RN.  03/10/2018 Here for post hosp f/u.  Pt on oxygen. Did not go to CIR. Staying at [facility] home. Prior hx of PNA, COPD, ILD.  Never on oxygen. Hx of frequent PNA. Dr Jess Barters is Lung MD in [facility] in [place].  Tested for Sjorgren, s  Lip bx neg for SSAs neg.  Wants to travel back to [place].   The patient's level of dyspnea has improved to some degree his weakness has improved as well.  He is receiving home health nurse and home physical therapy.  He does desire to return back to [place] soon.  He has a stable home environment and does have a pulmonary physician in the [place] system in [place]   03/18/2018 Here today to f/u copd and hypoxic resp failure  Has been going around without oxygen and sats to 85%.  Overall this patient is markedly improved from a week ago and he is stable and ready to proceed with his travel back home Also the patient continues to smoke and remains dyspneic with exertion  There is minimal productive cough.    Shortness of Breath  This is a chronic problem. The current episode started more than 1 year ago. The problem has been rapidly improving (notes more stamina.  lungs feel better). Associated symptoms include wheezing. Pertinent negatives include no abdominal pain, chest pain, ear pain, fever, headaches, hemoptysis, leg pain, leg swelling or sputum production. The symptoms are aggravated by odors, lying flat, weather changes, pollens, exercise and any activity. Associated symptoms comments: Mucus now is yellow sticky to now sl yellow Hard to expectorate mucus.  No flutter valve. Muscle weakness noted Notes slight nausea. Risk factors include smoking. He has tried beta agonist  inhalers for the symptoms. The treatment provided moderate relief. His past medical history is significant for allergies, CAD, chronic lung disease, COPD, DVT, a heart failure, PE and pneumonia.   Past Medical History:  Diagnosis Date  . Bronchiectasis ([place])   . CAD (coronary  artery disease)   . COPD (chronic obstructive pulmonary disease) ([place])   . Depression with anxiety   . GERD (gastroesophageal reflux disease)   . Hypothyroidism   . Myasthenia gravis ([place])   . PE (pulmonary thromboembolism) ([place])   . Sjogren's disease ([place])   . Tobacco abuse      Family History  Problem Relation Age of Onset  . Heart attack Father      Social History   Socioeconomic History  . Marital status: Single    Spouse name: Not on file  . Number of children: 2  . Years of education: Not on file  . Highest education level: Not on file  Occupational History  . Not on file  Social Needs  . Financial resource strain: Patient refused  . Food insecurity:    Worry: Patient refused    Inability: Patient refused  . Transportation needs:    Medical: Patient refused    Non-medical: Patient refused  Tobacco Use  . Smoking status: Former Smoker    Packs/day: 0.50    Types: Cigarettes  . Smokeless tobacco: Never Used  Substance and Sexual Activity  . Alcohol use: Yes    Comment: occasional  . Drug use: Never  . Sexual activity: Not Currently  Lifestyle  . Physical activity:    Days per week: Not on file    Minutes per session: Not on file  . Stress: Not on file  Relationships  . Social connections:    Talks on phone: Patient refused    Gets together: Patient refused    Attends religious service: Patient refused    Active member of club or organization: Patient refused    Attends meetings of clubs or organizations: Patient refused    Relationship status: Patient refused  . Intimate partner violence:    Fear of current or ex partner: Patient refused    Emotionally abused: Patient refused    Physically abused: Patient refused    Forced sexual activity: Patient refused  Other Topics Concern  . Not on file  Social History Narrative  . Not on file     Allergies  Allergen Reactions  . Hydromorphone Other (See Comments)    hallucinations   . Immune Globulin  Other (See Comments)     Bilateral pulmonary embolism       Outpatient Medications Prior to Visit  Medication Sig Dispense Refill  . aspirin (ASPIRIN 81) 81 MG EC tablet Take 81 mg by mouth daily.    Marland Kitchen atorvastatin (LIPITOR) 40 MG tablet Take 40 mg by mouth at bedtime.    Marland Kitchen azelastine (ASTELIN) 0.1 % nasal spray Place 2 sprays into the nose 2 (two) times daily.    Marland Kitchen buPROPion (WELLBUTRIN SR) 150 MG 12 hr tablet Take 150 mg by mouth 2 (two) times daily.    . Calcium Carb-Ergocalciferol 500-200 MG-UNIT TABS Take 1 tablet by mouth daily.    . fluticasone (FLONASE) 50 MCG/ACT nasal spray Place 1 spray into the nose 2 (two) times daily.    . furosemide (LASIX) 40 MG tablet Take 1 tablet (40 mg total) by mouth daily. 30 tablet 0  . ipratropium (ATROVENT) 0.03 % nasal spray Place 1 spray into both nostrils  every 12 (twelve) hours.    . Ipratropium-Albuterol (COMBIVENT RESPIMAT) 20-100 MCG/ACT AERS respimat Inhale 1 puff into the lungs every 6 (six) hours as needed for wheezing. 1 Inhaler 0  . ipratropium-albuterol (DUONEB) 0.5-2.5 (3) MG/3ML SOLN Use twice a day scheduled and every 4 hours as needed for shortness of breath and wheezing 360 mL 0  . levothyroxine (SYNTHROID, LEVOTHROID) 175 MCG tablet Take 175 mcg by mouth daily before breakfast.    . mirtazapine (REMERON) 15 MG tablet Take 30 mg by mouth at bedtime.    . montelukast (SINGULAIR) 10 MG tablet Take 10 mg by mouth at bedtime.    Marland Kitchen morphine (MS CONTIN) 15 MG 12 hr tablet Take 15 mg by mouth at bedtime.  0  . Multiple Vitamins-Minerals (ICAPS AREDS 2 PO) Take 1 tablet by mouth 2 (two) times daily.    Marland Kitchen omeprazole (PRILOSEC) 40 MG capsule Take 40 mg by mouth 2 (two) times daily.    . ondansetron (ZOFRAN) 4 MG tablet Take 4 mg by mouth every 6 (six) hours as needed for nausea or vomiting.     . potassium chloride (K-DUR) 10 MEQ tablet Take 1 tablet (10 mEq total) by mouth daily. 30 tablet 0  . predniSONE (DELTASONE) 5 MG tablet Label  &  dispense according to the schedule below. 10 Pills PO for 3 days then, 8 Pills PO for 3 days, 6 Pills PO for 3 days, 4 Pills PO for 3 days, 2 Pills PO for 3 days, 1 Pills PO for 3 days, 1/2 Pill  PO for 3 days then STOP. Total 95 pills. 95 tablet 0  . pregabalin (LYRICA) 50 MG capsule Take 50 mg by mouth at bedtime.    . pyridostigmine (MESTINON) 60 MG tablet Take 60 mg by mouth 4 (four) times daily.     . rivaroxaban (XARELTO) 20 MG TABS tablet Take 20 mg by mouth daily.    . Tiotropium Bromide-Olodaterol (STIOLTO RESPIMAT) 2.5-2.5 MCG/ACT AERS Inhale 2 puffs into the lungs daily.    Marland Kitchen venlafaxine (EFFEXOR) 75 MG tablet Take 225 mg by mouth daily.    . vitamin B-12 (CYANOCOBALAMIN) 1000 MCG tablet Take 1,000 mcg by mouth daily.    Marland Kitchen azathioprine (IMURAN) 100 MG tablet Take 200 mg by mouth at bedtime.    . benzonatate (TESSALON) 200 MG capsule Take 1 capsule by mouth as needed for cough.    Marland Kitchen guaiFENesin (MUCINEX) 600 MG 12 hr tablet Take by mouth 2 (two) times daily.    Marland Kitchen guaiFENesin-dextromethorphan (ROBITUSSIN DM) 100-10 MG/5ML syrup Take 10 mLs by mouth 2 (two) times daily as needed for cough.     Marland Kitchen Respiratory Therapy Supplies (FLUTTER) DEVI Use 4 times daily (Patient not taking: Reported on 03/18/2018) 1 each 0   No facility-administered medications prior to visit.       Review of Systems  Constitutional: Negative for fever.  HENT: Negative for ear pain.   Respiratory: Positive for shortness of breath and wheezing. Negative for hemoptysis and sputum production.   Cardiovascular: Negative for chest pain and leg swelling.  Gastrointestinal: Negative for abdominal pain.  Neurological: Negative for headaches.       Objective:   Physical Exam Vitals:   03/18/18 1152  BP: 112/75  Pulse: (!) 114  Resp: 18  Temp: 97.8 F (36.6 C)  TempSrc: Oral  SpO2: 97%  Weight: 158 lb (71.7 kg)  Height: _0  (1.854 m)    Gen: Thin white male on nasal  oxygen, in no distress,  normal  affect  ENT: No lesions,  mouth clear,  oropharynx clear, no postnasal drip  Neck: No JVD, no TMG, no carotid bruits  Lungs: No use of accessory muscles, no dullness to percussion, distant breath sounds with bibasilar dry rales  Cardiovascular: RRR, heart sounds normal, no murmur or gallops, no peripheral edema  Abdomen: soft and NT, no HSM,  BS normal  Musculoskeletal: No deformities, no cyanosis or clubbing  Neuro: alert, non focal  Skin: Warm, no lesions or rashes  No results found. Echo 02/2018 Study Conclusions  - Left ventricle: The cavity size was normal. Wall thickness was   normal. Systolic function was normal. The estimated ejection   fraction was in the range of 55% to 60%. Wall motion was normal;   there were no regional wall motion abnormalities. Doppler   parameters are consistent with abnormal left ventricular   relaxation (grade 1 diastolic dysfunction). - Atrial septum: There was an atrial septal aneurysm.  Impressions:  - Normal LV systolic function; mild diastolic dysfunction.  ------------------------------------------------------------------- Left ventricle:  The cavity size was normal. Wall thickness was normal. Systolic function was normal. The estimated ejection fraction was in the range of 55% to 60%. Wall motion was normal; there were no regional wall motion abnormalities. Doppler parameters are consistent with abnormal left ventricular relaxation (grade 1 diastolic dysfunction).  CT chest 02/18/18: IMPRESSION: Significantly increased airspace opacities are noted throughout both lungs consistent with severely worsening pneumonia. Increased mediastinal adenopathy is also noted most likely is inflammatory in etiology.     Assessment & Plan:  I personally reviewed all images and lab data in the [facility] system as well as any outside material available during this office visit and agree with the  radiology impressions.   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 Note in my opinion the use of IVIG in this patient was not a significant factor in promoting the embolism and could be used again if needed for his myasthenia gravis  COPD (chronic obstructive pulmonary disease) ([facility]) Chronic obstructive lung disease with chronic bronchitic component stable at this time Continue current inhaled medications Continue oxygen therapy as prescribed Continue slow prednisone taper   Respiratory failure ([place]) Chronic respiratory failure stable at this time Continue oxygen therapy  Myasthenia gravis ([place]) Myasthenia gravis stable at this time  Malnutrition of moderate degree Malnutrition improved at this time  Tobacco use Tobacco use now improved   Emm[place] was seen today for follow-up.  Diagnoses and all orders for this visit:  Centrilobular emphysema ([place])  PE (pulmonary thromboembolism) ([place])  Chronic respiratory failure with hypoxia ([facility])  Myasthenia gravis ([facility])  Malnutrition of moderate degree  Tobacco use

## 2018-03-18 NOTE — Assessment & Plan Note (Signed)
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

## 2018-03-19 LAB — MISCELLANEOUS TEST

## 2018-03-31 LAB — ACID FAST CULTURE WITH REFLEXED SENSITIVITIES: ACID FAST CULTURE - AFSCU3: NEGATIVE

## 2018-04-01 LAB — ACID FAST CULTURE WITH REFLEXED SENSITIVITIES (MYCOBACTERIA): Acid Fast Culture: NEGATIVE

## 2018-04-02 LAB — ACID FAST CULTURE WITH REFLEXED SENSITIVITIES: ACID FAST CULTURE - AFSCU3: NEGATIVE

## 2019-01-20 NOTE — Unmapped (Signed)
 [facility]   Physical Therapy Evaluation    Admit date:  01/20/2019          Today's Date: 01/20/2019            Patient Name/MR#:  Andre Olson     [id number]   Current Room:  V4098/J1914-N   Admitting Diagnosis: Weakness generalized [R53.1]  Left hip pain [M25.552]  Fall from slip, trip, or stumble, initial encounter [W01.0XXA]  Head injury, initial encounter [S09.90XA]  Laceration of scalp, initial encounter [S01.01XA]  Diarrhea, unspecified type [R19.7]    Admitting Provider:Peter Lieutenant Diego, DO    PATIENT HISTORY:    Patient Status: Observation    Andre Olson  has a past medical history of Arthritis, Cancer (HCC) (thyroid), Chicken pox, Chronic pain, Congenital heart defect, COPD (chronic obstructive pulmonary disease) (HCC), Coronary artery disease, Emphysema of lung (HCC), GERD (gastroesophageal reflux disease), Hyperlipidemia, Major depressive disorder, MRSA (methicillin resistant Staphylococcus aureus), Mumps, Myasthenia gravis (HCC), Myocardial infarction (HCC), Neuropathy, Orthostatic hypotension, PE (pulmonary thromboembolism) (HCC) (2018), Peripheral neuropathy, Pneumonia, PTSD (post-traumatic stress disorder), Rash, Rubeola, Sinus congestion, Sjoegren syndrome, STD (sexually transmitted disease), Thyroid disease, and Urinary tract infection.  has a past surgical history that includes Cardiac surgery; Coronary angioplasty with stent; Thyroidectomy; Stress Test Lexiscan/Adenosine (12/14/2013); urodynamics study (05/24/14); TURP; Neuroplasty / transposition ulnar nerve at elbow; Insertion central venous access device w/ subcutaneous port; and EKG Stress Test - Nuclear Med (05/09/2016).     Height:6' 1 (1.854 m)     Weight: 175 lb (79.4 kg)    Recent Labs:   Recent Labs     01/20/19  0528   HEMOGLOBIN 15.0  15.4     Current Medical Status: Andre Olson is a 68 y.o. male who presents to the ED due to multiple falls, diarrhea, subjective chills, nausea, and rhinorrhea.  Patient felt  light-headeded and dizzy when ambulating back to his bed and fell, landing on his L hip.  Patient then tried to get up and fell again, hitting his head.  Patient has complaint of L hip pain and a headache.  Patient presents with a head laceration.  Patient has a history of myasthenia gravis, peripheral neuropathy, COPD, and PE.  Patient diagnosed with fall, head injury, scalp laceration, weakness, diarrhea, and L hip pain.  Testing/Imaging: CT scan of head, CT scan of C spine, x-ray of L hip, chest x-ray negative.     Precautions / Important Patient Information:  Activity/Safety: Up with Assist        Therapy Precautions: Fall Risk    Home Environment/DME: Patient lives with his wife and other people (patient did not elaborate) in a 2 story house plus basement, 3-4 STE.  Patient's bedroom and full bathroom are located on the main level.  Patient has a SPC, FWW, and tub/shower combo.  Wife is on disability home to provide 24 hour sup/assist.    PT PLOF: Patient ambulates with no AD.  Patient has had multiple falls at home.  Patient is an active driver.    Patient has had therapy in the past:  Patient has participated in acute PT services during prior hospitalizations.    Personal Factors (i.e. Social background, coping, education, profession, support and relationships):  Patient has a history of major depressive disorder, myasthenia gravis, and PTSD.  Unsure of level of assistance patient has available, as he was not forthcoming with information at times.    Upon arrival: patient supine in bed, patient agreeable and bed  alarm off     Pt presents with:room air and telemetry    Pt states I walk just fine.  I just get off balance.  Mental status Follows Commands Orientation   [x] Alert [] Multi step [] x4   [x] Cooperative [] 2 step [x] Person   [] Uncooperative [x] 1 step [x] Place   [] Drowsy [] None [x] Time   [] Lethargic [x] Consistently  [] Events   [x] Appropriate [] Inconsistently  [] Not oriented   [] Inappropriate [] Delayed     [] Unresponsive       Barriers to Learning   [] None [x] Pre-Morbid Mental Status [] Communication Barrier   [x] Cognition [] Motivation [] Anxiety    [] Hearing [] Language barrier Non-English Speaking [x] Safety Awareness   [] Vision [x] Readiness [x] Other:  Decreased insight - Patient states he walks just fine, is just off balance, despite multiple falls.  Patient refused to use a FWW during ambulation despite unsteady gait.   [x] Pain [] Easily Distracted    []       EXAMINATION    Observations/Vitals:  Patient on room air, with O2 sats 92% at end of ambulation.    Pain Assessment rating:  5/10       Pain Location:  Back  Therapist Pain Intervention (if pt reports unacceptable pain level):  activity modified and patient reports pain tolerable level for therapy    Body Functions   Range of Motion Strength    WFL Impaired  WFL Impaired   Right UE   Right UE     Left UE   Left UE     Right LE x  Right LE  x   Left LE x  Left LE  x   ROM/Strength Deficits:  B LE strength:  Hip flexion 4-/5, knee flexion/extension and ankle DF/PF 4/5.    Coordination Tone   Sensation   [] WFL   [x] WNL   [] WFL     [x] Impaired    [] Not Tested/NA   [] Impaired    [] Not Tested/NA   [x] Impaired    []  Patient denies/Not Tested     Comments:  Patient demonstrated ataxia throughout ambulation.  Patient states he has N/T in his neck at baseline.    Activities and Participation  Mobility   IND MOD-I SUPERVISION MIN Mod Max Total Not Addressed this session      Distant  SBA        Rolling         x   Supine-Sit    x        Sit-Supine         x   Sit to Stand     x       Bed to Chair         x   Gait     x x      Stairs          x   Gait/Mobility Comments:  Patient performed supine to sit transfer with SBA and HOB elevated 45 degrees.  Patient performed sit to stand transfer from EOB with min A and no AD.  Patient ambulated 210 feet with min A/mod A and no AD, demonstrating unsteadiness and ataxia throughout.  Patient had multiple L lateral LOB episodes  that required up to mod A to correct.  Patient ambulated with narrow BOS and moderate lateral trunk sway.  Patient was offered a FWW multiple times, but patient adamantly declined using one, stating, I walk just fine.  I just get off balance.    Balance  Static Sitting:  G-/F+ : Accept minimal resistance.  Dynamic  Sitting:  Fair : Able to sit unsupported. Able to minimally shift weight to same side and front. Difficulty crossing midline.  Static Standing:  Fair - : Requires minimal assist or UE support in order to stand without balance loss.  Dynamic Standing:  Poor : Able to stand with moderate assist and minimally reach to same side. Unable to cross midline.    Functional Outcomes/Testing:   6 CLICKS Basic Mobility  Basic Mobility AM-PAC Raw Score:: 16    Treatment provided this session:  Patient was educated on the importance of functional mobility and sitting upright throughout the day.    At end of session:  White board updated, RN aware of patient's status, Phone within reach, Patient sitting up in chair, Gait belt used, Chair alarm activated, Call light within reach and All lines intact    PATIENT PRESENTATION:                             Rehab Diagnosis: Decreased Functional Mobility    Interdisciplinary  Communication Patient/Family Education Rehab Potential   [x] Spoke w/RN prior to session [x] Role of PT [x] Transfer Training [] Excellent   [] RN notified about issues during session [x] Education about DX [] Home Safety [x] Good   [x] RN updated on pt performance [x] Use of Call light for assist [] Body Mechanics [] Fair   [] Spoke with social services [] Precautions [] Pt unable to learn [] Poor   [] Spoke with OT [] HEP Issued []  Family/Caregiver Educated [] Guarded   []  Spoke with MD [x] Treatment Plan reviewed [x] No Family/Caregiver Present    []  Other: [x] Wellsite geologist [] Other    Comments:    Problem List:Strength, Transfers, Gait, Balance, Pain, Activity Tolerance and Cognition      PATIENT COMPLEXITY PROFILE:  (default to lowest evaluation component)    Patient History:  Includes comorbidities, environmental/personal factors, learning/cognition, domestic life  High: 3 or more    Examination of Body Functions: Includes Body Functions (regions/systems/structures) and Activities and Participation (difficulties an individual may have in executing activity or difficulty an individual may experience in involvement in life situations  Moderate: 3      Patient Presentation: Patient condition  Moderate:  Evolving    Complexity of Clinical Decision Making:  Moderate: Moderate      PLAN OF CARE (as discussed with patient/family and agreed upon):    Patient/Family Goal: To return home.    The Physical Therapy Plan of Care will consist of, but not limited to the following:   Bed Mobility, Gait Training, Transfer Training, Balance, Therapeutic Exercises, Therapeutic Activities, Coordination, Pt/Family Education, Interior and spatial designer, Stair Training, HEP, Instruction in AD, Neuromuscular Re-education    Physical Therapy Goals     From:  01/20/2019 to 02/03/19    Short term goals  1.  Patient will perform supine <--> sit transfers with S.  2.  Patient will perform sit <--> stand transfers with SBA.  3.  Patient will ambulate 250 feet with SBA and least restrictive AD.  4.  Patient will perform HEP with S.    Long term goals  1.  Patient will ascend/descend 4 steps with SBA.    PT Discharge Recommendations: Inpatient Rehab Unit    Supervision for Safety Required:  Full time supervision required: Intermittent-Unobserved for brief periods of time, but not left alone    Physical Assistance Required for Daily Care:  Moderate Physical Assistance required: Caregiver supplies 26-50% of physical assistance    Intensity of Physical Therapy Rehab Services Recommended:  Inpatient  Rehabilitation: Due to clinical complexity, patient requires comprehensive rehabilitation program overseen by a Rehab Medical Practitioner. Patient can benefit from specialized  rehabilitation skills and/or equipment. Patient demonstrates   or = 2 functional impairments requiring at least minimum assistance (cognition and/or physical limitations), Rehab potential with expectation for clinical and functional improvement is noted. Patient demonstrates potential to tolerate intensive rehab program.     Comments:     Discharge Recommendations Comments: Patient demonstrates unsteady gait, requiring up to mod A to maintain balance during ambulation.  Patient was admitted s/p falling multiple times.  Unsure of level of assistance patient has available at home, as he was not forthcoming with information at times.  Recommend patient participate in inpatient rehab.    PT Discharge Equipment Needs: None         *Patient equipment needs may change pending patients progress and discharge disposition during acute hospitalization.   [x]                                 Pt will be seen by Physical therapy Service and progressed as tolerated    []                         Pt functioning at/near baseline. Pt denies any needs at this time.   Will discontinue skilled Acute PT at this time.       PT Frequency: 5x/wk       PT Duration of Treatment: 2 weeks    Melvyn Neth, PT, DPT     Time Initial Eval Initiated: 1214       Time Out:  12:24  Total Treatment Time:  10 minutes  Timed Code Minutes (does not apply to eval only):  8' PT eval, 2' TA    Therapist to enter the following evaluation charge:  Moderate Complexity

## 2019-01-20 NOTE — Progress Notes (Signed)
 Observation appropriate at time of review    MCG: Traumatic Brain Injury, Nonsurgical Treatment     Summary: Head injury in patient > 70 years old on anticoagulation. Laceration on head repaired, no other acute injuries on imaging. Tripped over oxygen concentrator at home. Multiple past falls. VSS, no acute labs/imaging, but does have pH 7.30. EKG SR/SB.

## 2019-01-21 MED ORDER — PANTOPRAZOLE SODIUM 40 MG PO TBEC
40 | ORAL | Status: DC
Start: 2019-01-21 — End: 2019-01-21

## 2019-01-21 MED ORDER — RIVAROXABAN 20 MG PO TABS
20 | ORAL | Status: DC
Start: 2019-01-21 — End: 2019-01-21

## 2019-01-21 MED ORDER — LEVOTHYROXINE SODIUM 200 MCG PO TABS
200 MCG | ORAL | Status: DC
Start: 2019-01-21 — End: 2019-01-21

## 2019-01-21 MED ORDER — GENERIC EXTERNAL MEDICATION
Status: DC
Start: 2019-01-21 — End: 2019-01-21

## 2019-01-21 MED ORDER — MELATONIN 3 MG PO TABS
3 | ORAL | Status: DC | PRN
Start: 2019-01-21 — End: 2019-01-21

## 2019-01-21 MED ORDER — BACLOFEN 10 MG PO TABS
10 MG | ORAL | Status: DC
Start: 2019-01-21 — End: 2019-01-21

## 2019-01-21 MED ORDER — GENERIC EXTERNAL MEDICATION
Status: DC | PRN
Start: 2019-01-21 — End: 2019-01-21

## 2019-01-21 MED ORDER — VENLAFAXINE HCL ER 150 MG PO CP24
150 | ORAL | Status: DC
Start: 2019-01-21 — End: 2019-01-21

## 2019-01-21 MED ORDER — MIRTAZAPINE 30 MG PO TABS
30 MG | Freq: Every evening | ORAL | Status: DC
Start: 2019-01-21 — End: 2019-01-21

## 2019-01-21 MED ORDER — MORPHINE SULFATE 15 MG PO TABS
15 | ORAL | Status: DC | PRN
Start: 2019-01-21 — End: 2019-01-21

## 2019-01-21 MED ORDER — BUPROPION HCL ER (SR) 100 MG PO TB12
100 MG | Freq: Two times a day (BID) | ORAL | Status: DC
Start: 2019-01-21 — End: 2019-01-21

## 2019-01-21 MED ORDER — MONTELUKAST SODIUM 10 MG PO TABS
10 | ORAL | Status: DC
Start: 2019-01-21 — End: 2019-01-21

## 2019-01-21 MED ORDER — CLOPIDOGREL BISULFATE 75 MG PO TABS
75 | ORAL | Status: DC
Start: 2019-01-21 — End: 2019-01-21

## 2019-01-21 MED ORDER — GENERIC EXTERNAL MEDICATION
Freq: Every day | Status: DC
Start: 2019-01-21 — End: 2019-01-21

## 2019-01-21 MED ORDER — HYDROCODONE-ACETAMINOPHEN 5-325 MG PO TABS
5-325 MG | Freq: Three times a day (TID) | ORAL | Status: DC | PRN
Start: 2019-01-21 — End: 2019-01-21

## 2019-01-21 MED ORDER — SODIUM CHLORIDE 0.9 % IV SOLN
0.9 | INTRAVENOUS | Status: DC
Start: 2019-01-21 — End: 2019-01-21

## 2019-01-21 MED ORDER — AZATHIOPRINE 50 MG PO TABS
50 MG | Freq: Every evening | ORAL | Status: DC
Start: 2019-01-21 — End: 2019-01-21

## 2019-01-21 MED ORDER — TIOTROPIUM BROMIDE MONOHYDRATE 2.5 MCG/ACT IN AERS
2.5 MCG/ACT | RESPIRATORY_TRACT | Status: DC
Start: 2019-01-21 — End: 2019-01-21

## 2019-01-21 MED ORDER — TOPIRAMATE 15 MG PO CPSP
15 | ORAL | Status: DC
Start: 2019-01-21 — End: 2019-01-21

## 2019-01-21 MED ORDER — LORAZEPAM 0.5 MG PO TABS
0.5 MG | Freq: Two times a day (BID) | ORAL | Status: DC | PRN
Start: 2019-01-21 — End: 2019-01-21

## 2019-01-21 MED ORDER — PYRIDOSTIGMINE BROMIDE 60 MG PO TABS
60 | ORAL | Status: DC
Start: 2019-01-21 — End: 2019-01-21

## 2019-01-21 MED ORDER — FLUTICASONE PROPIONATE 50 MCG/ACT NA SUSP
50 MCG/ACT | Freq: Two times a day (BID) | NASAL | Status: DC
Start: 2019-01-21 — End: 2019-01-21

## 2019-01-21 MED ORDER — ATORVASTATIN CALCIUM 40 MG PO TABS
40 | ORAL | Status: DC
Start: 2019-01-21 — End: 2019-01-21

## 2019-01-21 MED ORDER — ROFLUMILAST 500 MCG PO TABS
500 | ORAL | Status: DC
Start: 2019-01-21 — End: 2019-01-21

## 2019-01-21 MED ORDER — PROMETHAZINE HCL 12.5 MG PO TABS
12.5 | ORAL | Status: DC | PRN
Start: 2019-01-21 — End: 2019-01-21

## 2019-01-21 MED ORDER — PREGABALIN 150 MG PO CAPS
150 | ORAL | Status: DC
Start: 2019-01-21 — End: 2019-01-21

## 2019-01-21 NOTE — Discharge Instructions (Signed)
 Discharge Planning  Social Work   Date/Time of Admission: 01/20/2019  4:24 AM  Patient presents with:        Diagnosis    Fall from slip, trip, or stumble, initial encounter     Attending Provider: Glory Rosebush, DO    PCP: Vella Raring Scheidler, DO  Room/Bed: X3244/W1027-O  GLOS:    ALOS: 0 days  DOB: November 30, 1950  Age: 68 y.o.    Plan A: ipr recommended  Plan B: covid (-) 8/19    Risk Stratification:   62  0-28 = Low (Green)  29-58= Moderate (Yellow)  59-90= High (Red)    Barrier Assessment:  Medical Management Needs: Polypharmacy > 5 medications  Health Literacy: None apply  Self Care: Fall history  Mental Health: None apply  Family/Patient Resistance: None apply  Advanced Illness/Disease: None apply  Substance Abuse: None apply  Problem Medications: None apply  Increased Skilled Needs: None apply  Complex Placement: None apply      Assessment and Plan:  SW spoke with regarding the discharge planning process. Spoke with pt and significant other, Andre Olson re: d/c planning and PT/OT recommendation for IPR.  Pt states Andre Olson is home 24 hrs and able to provide assistance.  He doesn't feel he could benefit from IPR.  Offered HHC, also declined.  The collaboration with the interdisciplinary team will continue with patient and family throughout the hospitalization.  For future contact please SW [phone].

## 2019-03-15 NOTE — Progress Notes (Signed)
Received CMN for oxygen from [facility]) for Dr. Thana Ates to sign. Nothing in chart to show that the patient ever saw Dr. Thana Ates. Per CMN initial certification date was 03/04/18. Patient has not been seen anywhere in [facility] system since 2018. Called Dawn at [facility] with information. Dawn verbalized understanding and will  find correct MD to sign CMN.

## 2019-05-08 NOTE — Discharge Instructions (Signed)
 Discharge Planning Brief Note    Plan A: Outpatient PT ordered in Epic - patient provided with outpatient PT contact info & address    Plan B:            Reason for Intervention:  PT/OT ordered & evaluated patient, recommending outpatient PT.  Order placed in Epic.  Patient provided with outpatient PT contact phone number & address.

## 2019-05-19 NOTE — Unmapped (Signed)
Post Op Follow-up  Date of Surgery: 05/07/19    Procedure:Open Reduction Internal Fixation Right Trimalleolar Fracture     HPI:  Andre Olson is 68 y.o. and is now 2 weeks post right Open Reduction Internal Fixation Right Trimalleolar Fracture .  He is doing well.  He has no neurovascular or constitutional-type symptoms.     Pain level :4-9/10    The patient remains NWB and is in a w/c and has crutches. Patient states he fell while using his crutches 3 days ago. Surgical dressing removed.  Staples and sutures removed, steri-strips applied    Post-Op Exam:  On examination, the incision is clean, dry, and healing well.no signs of infection, moderate edema as expected post -op.    Osteopathic Musculoskeletal Exam: no scoliosis or kyphosis noted. Somatic dysfunction related to primary diagnosis.      Radiographs:  No results found.        Impression/Plan:    ICD-10-CM    1. Closed trimalleolar fracture of right ankle with routine healing, subsequent encounter  S82.851D    2. Orthopedic aftercare  Z47.89        Overall the patient is doing well. His incision is healing well with no signs of infection.     PLAN:   1. Provided the patient with a boot today in the office.   2. He will maintain nonweightbearing status with use of crutches for assistance.   3. Continue with RICE treatments and antiinflammatories as needed.   4. I will see him back in 4-5 weeks and x-rays will be taken at that time. All questions answered.     This chart was created with the assistance of Delbert Harness, a scribe with [employer]. I have reviewed all scribe entries and attest to their accuracy.                      Electronically signed by:  Esperanza Sheets, DO

## 2019-10-07 IMAGING — CT CT CHEST W/O CM
2 of 3 series · 15 of 36 positions shown, 18 images · non-contrast
Comparison: Chest x-ray 02/08/2018

CLINICAL DATA: Progressive hypoxemia.  Pneumonia.

EXAM:
CT CHEST WITHOUT CONTRAST
TECHNIQUE: Multidetector CT imaging of the chest was performed following the
standard protocol without IV contrast.

[Series 3: chest w/o 2mm st · axial · non-contrast · 0.72mm/px · z∈[+1335,+1619]mm · 12 of 168 slices shown, 15 images]
[im 13/168  mediastinal]
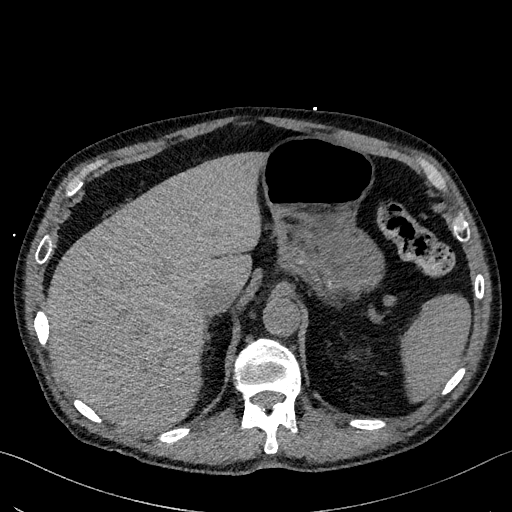
[im 13/168  lung]
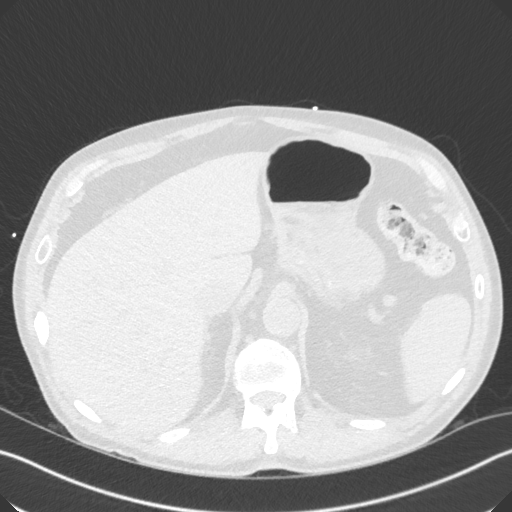
[im 25/168  lung]
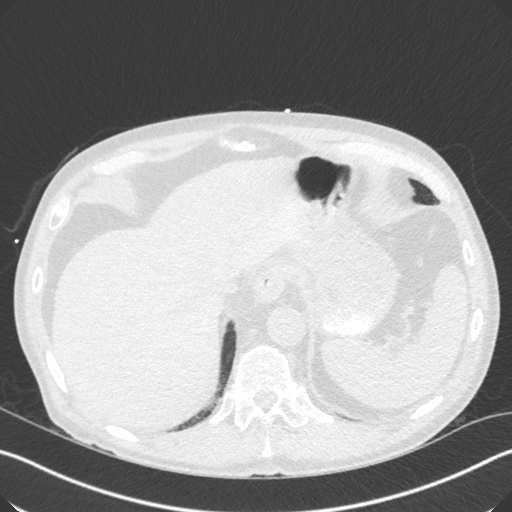
[im 38/168  lung]
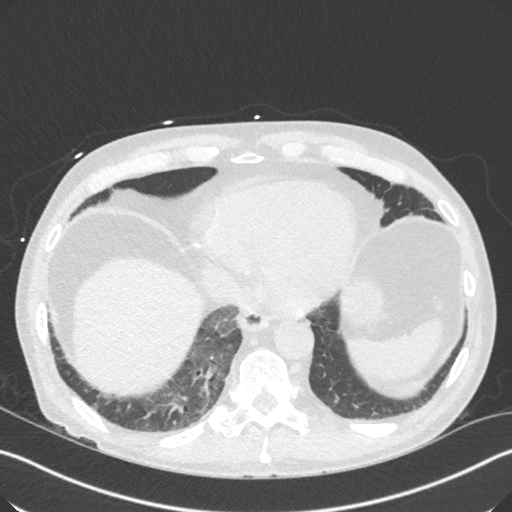
[im 50/168  lung]
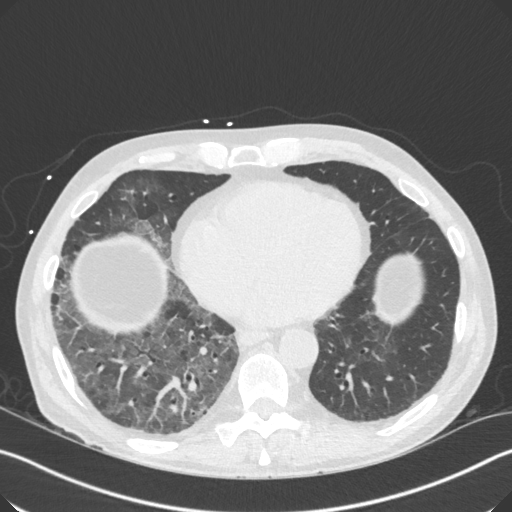
[im 62/168  mediastinal]
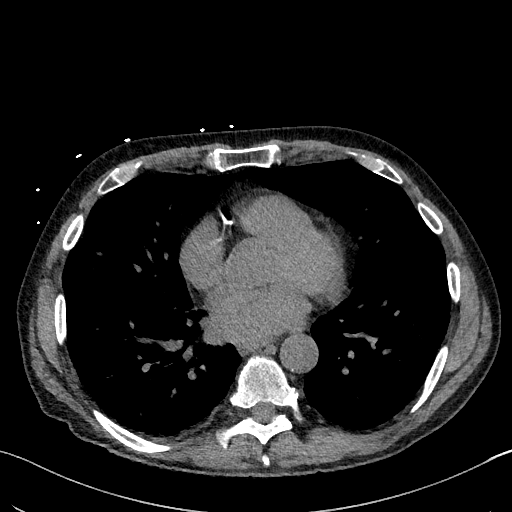
[im 62/168  lung]
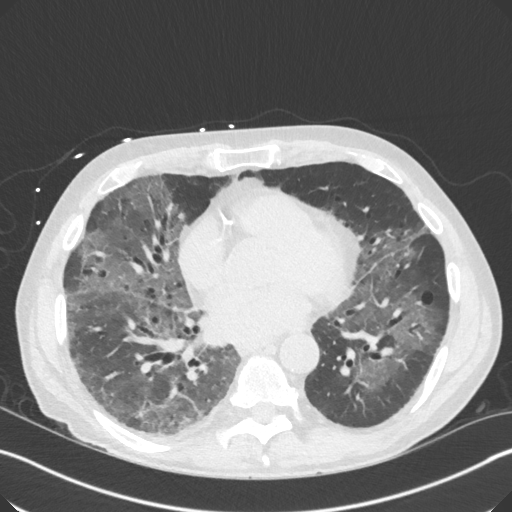
[im 75/168  lung]
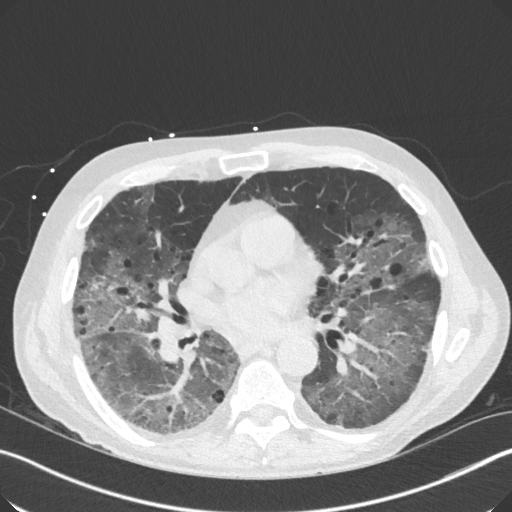
[im 93/168  lung]
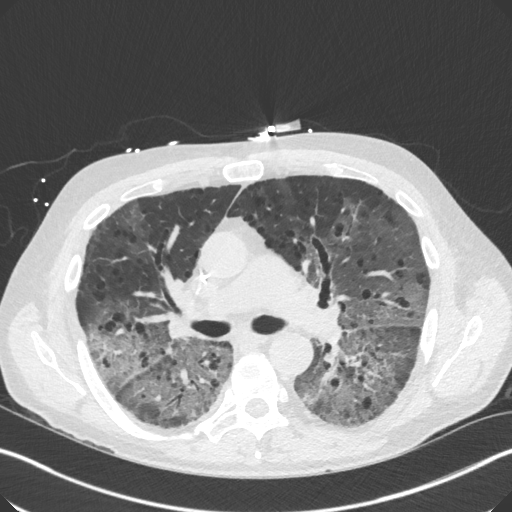
[im 106/168  lung]
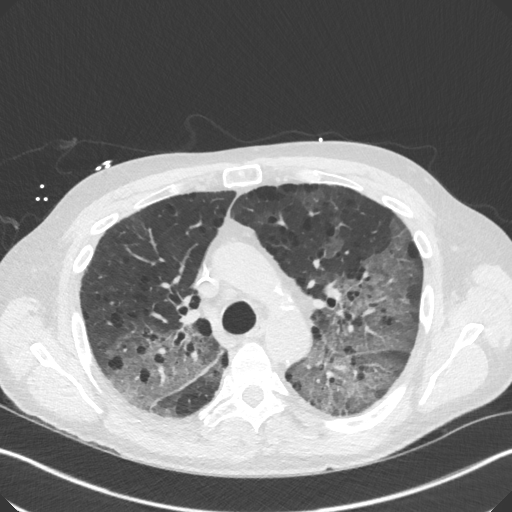
[im 118/168  mediastinal]
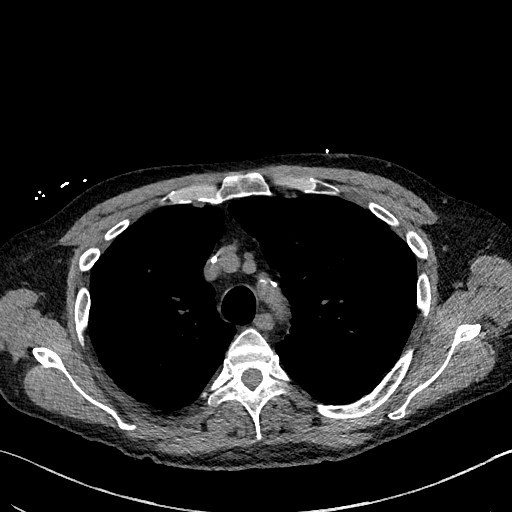
[im 118/168  lung]
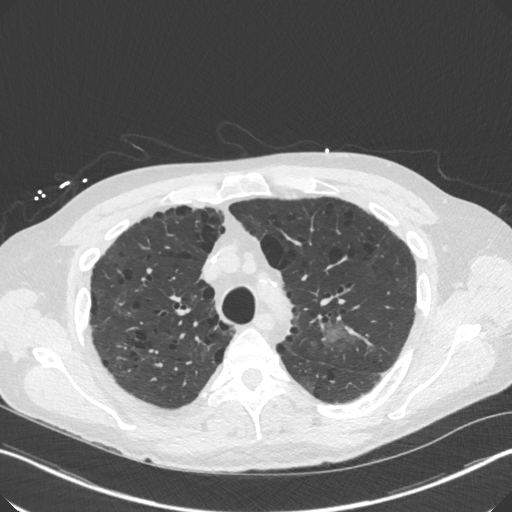
[im 130/168  lung]
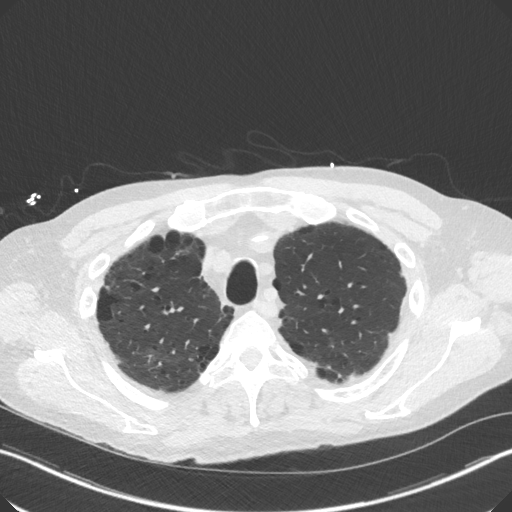
[im 143/168  lung]
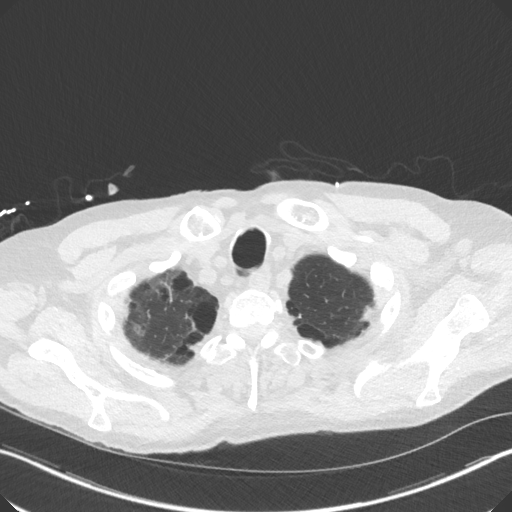
[im 155/168  lung]
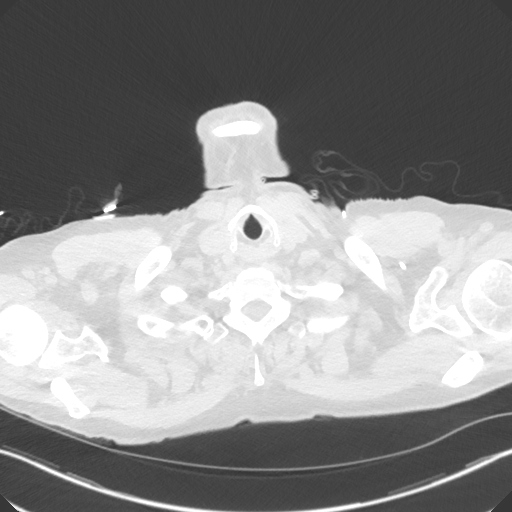

[Series 5: chest w/o 3mm st cor · coronal · non-contrast · 0.65mm/px · 3 of 101 slices shown]
[im 21/101  lung]
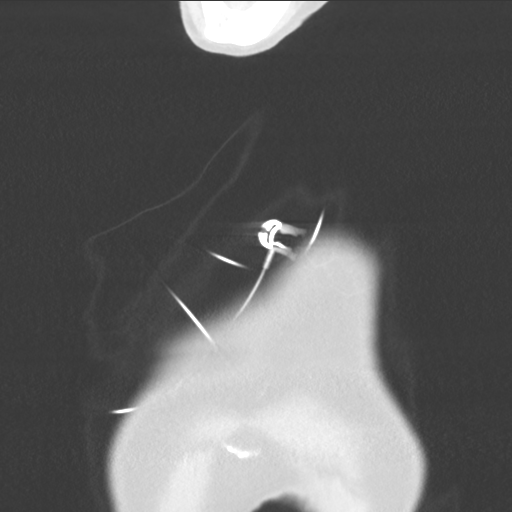
[im 41/101  lung]
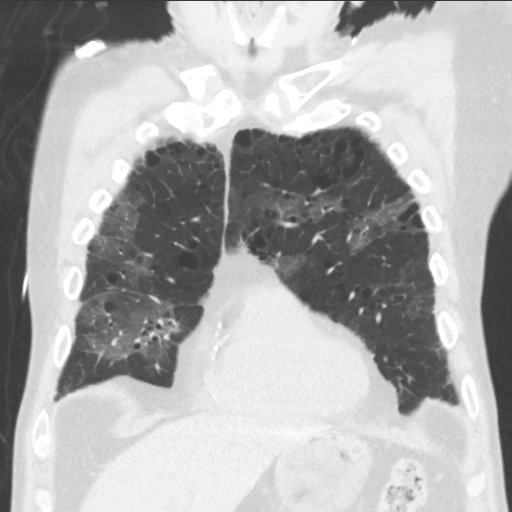
[im 61/101  lung]
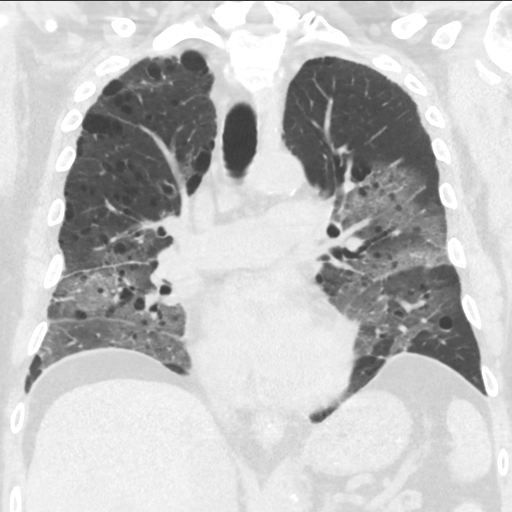

[15 of 36 positions shown; findings below may reference images not displayed]

FINDINGS: Cardiovascular: Heart size is normal. Atherosclerotic calcifications
are present coronary arteries. Atherosclerotic changes are present
at the aortic arch. Pulmonary arteries mildly prominent bilaterally.
No significant pericardial effusion is present.

Mediastinum/Nodes: No significant mediastinal, axillary, or hilar
adenopathy is present. The esophagus is within normal limits. The
thoracic inlet is unremarkable.

Lungs/Pleura: Extensive confluent ground-glass attenuation is
present throughout the lower lobes, the lingula, and right middle
lobe. Consolidation is most evident in the superior segment of the
left lower lobe. Extensive centrilobular emphysematous changes are
present. No significant pleural effusion is present.

Upper Abdomen: Unremarkable.

Musculoskeletal: A remote superior endplate fractures present at T7.
Vertebral body heights alignment are otherwise maintained. No focal
lytic or blastic lesions are present. Ribs are within normal limits.
IMPRESSION: 1. Extensive confluent ground-glass attenuation throughout both
lungs with consolidation more prevalent in the superior segment of
the left lower lobe. The findings are nonspecific, but most
concerning for multi lobar pneumonia. Edema or possibly could have a
similar appearance. This is to extensive for atelectasis.
Consolidation in the superior segment in the left lower lobe
suggests infection.
2.  Aortic Atherosclerosis ([facility]-EPG.G).
3.  Emphysema ([facility]OF-UX1.F).
4. Coronary artery disease

## 2019-10-07 IMAGING — CR DG CHEST 2V
2 series · 2 of 2 positions shown · non-contrast
Comparison: February 06, 2018

CLINICAL DATA: Chest pain and shortness of breath

EXAM:
CHEST - 2 VIEW

[chest lat]
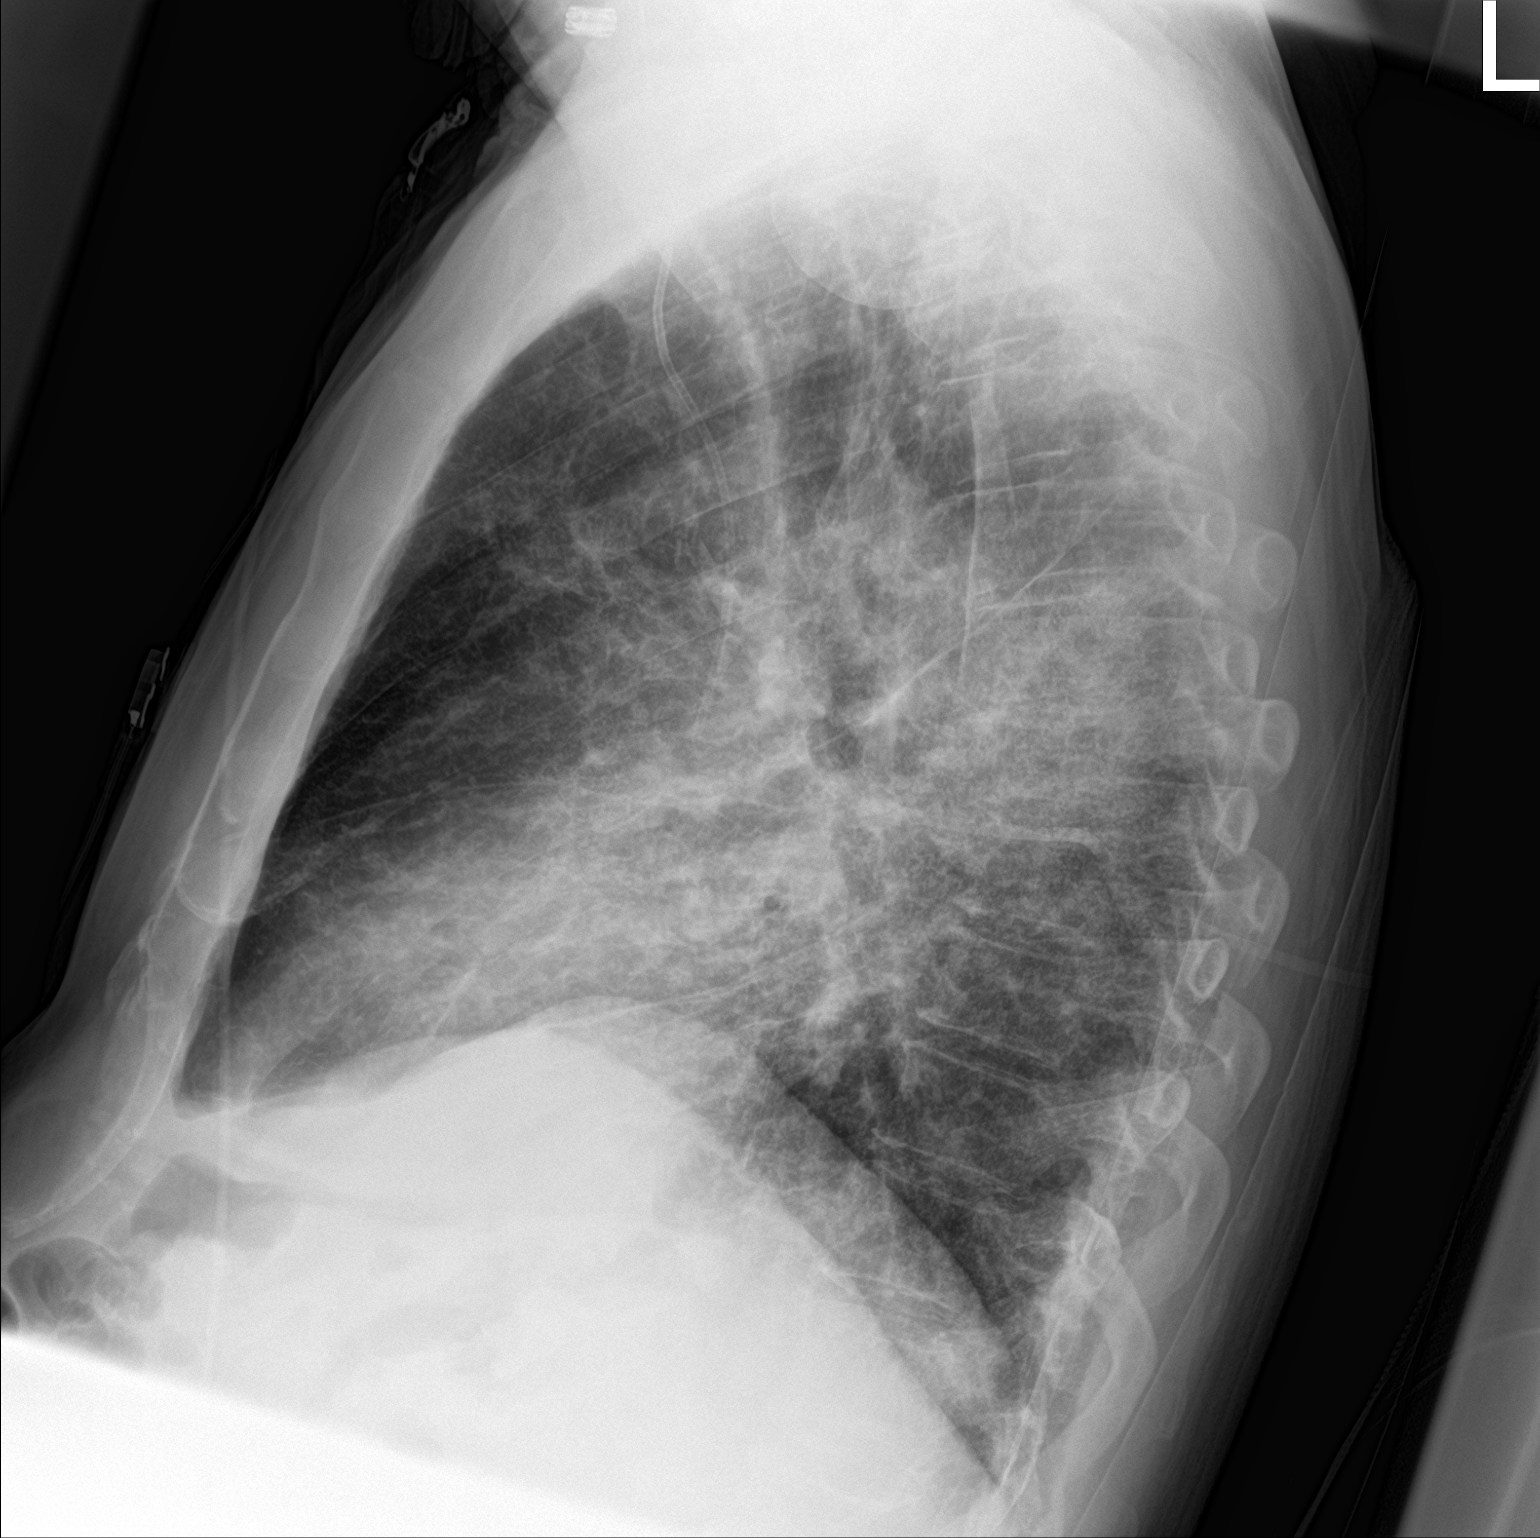

[chest ap]
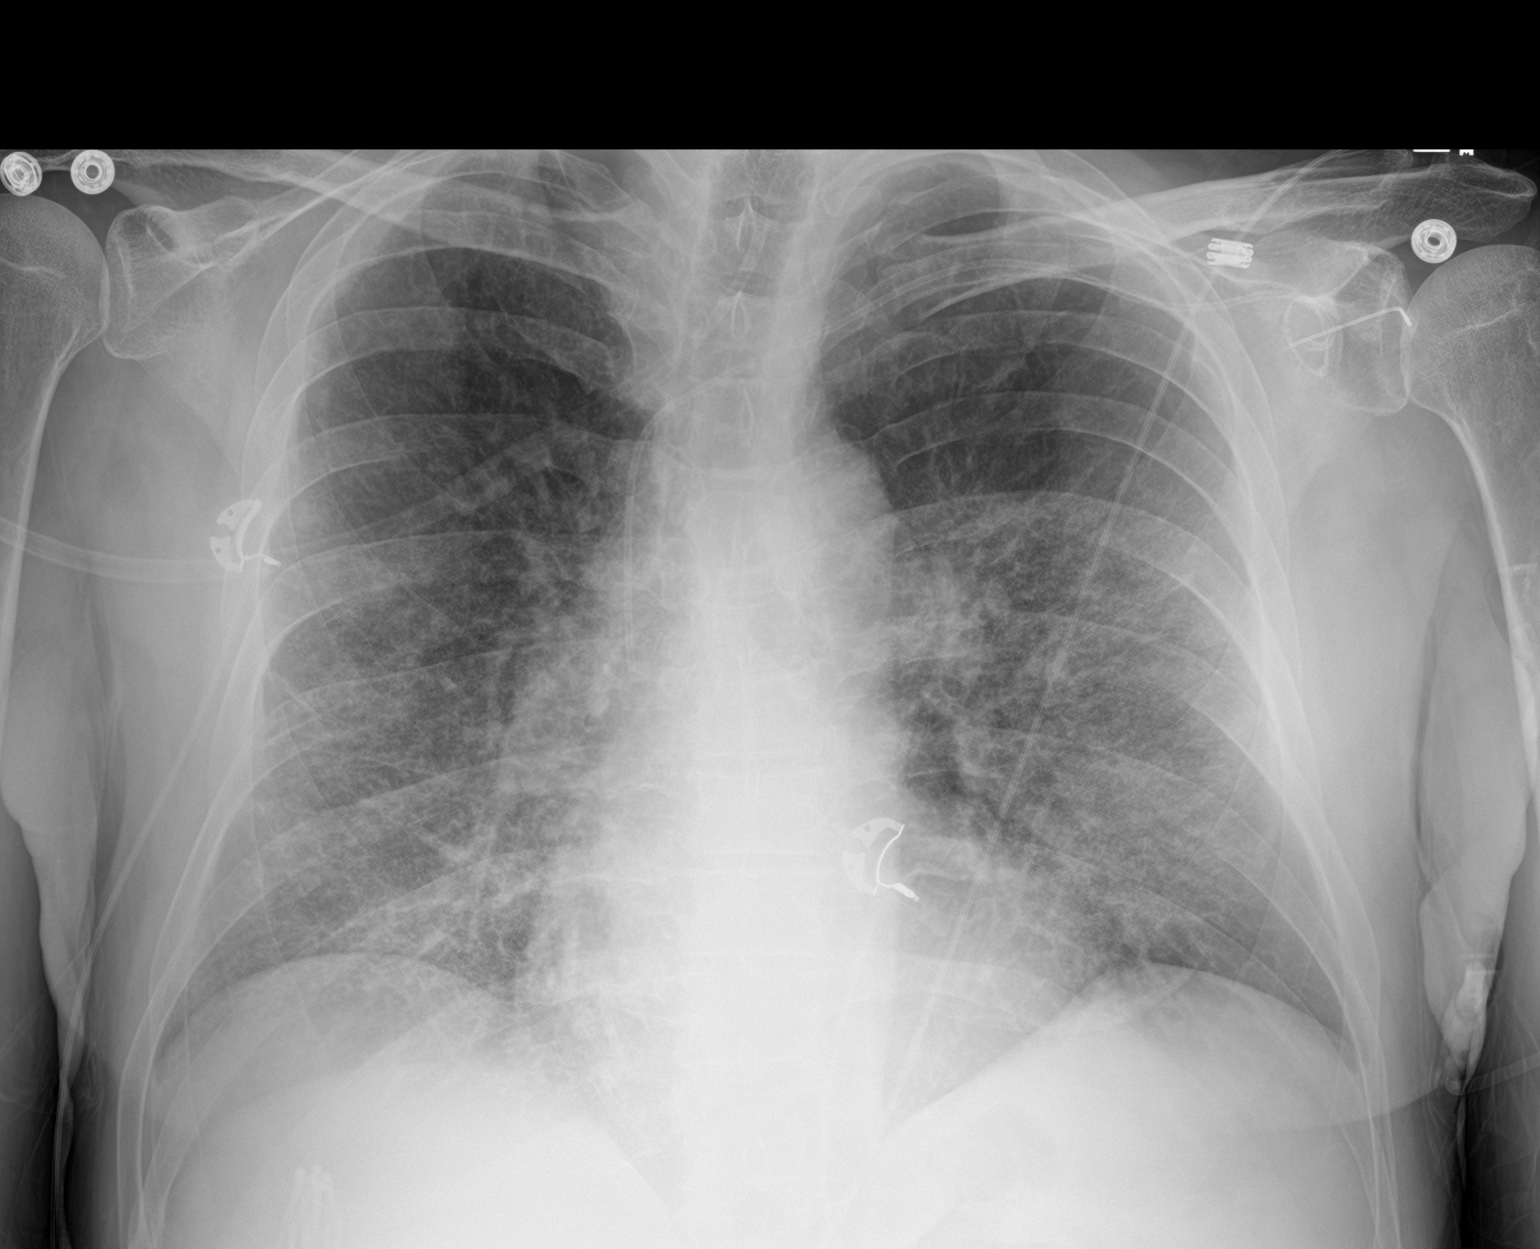

[2 of 2 positions shown; findings below may reference images not displayed]

FINDINGS: The left central line is stable. There appears to be a small bulla
in the left apex. No pneumothorax. Bilateral pulmonary opacities
most prominent in the mid lower lungs. No other interval changes or
acute abnormalities.
IMPRESSION: 1. Stable left Port-A-Cath.
2. Bilateral pulmonary opacities may represent worsening edema
versus worsening bilateral pneumonia. Recommend clinical correlation
and follow-up to resolution.

## 2019-10-12 IMAGING — DX DG CHEST 1V PORT
1 series · 1 of 1 positions shown · non-contrast
Comparison: 02/13/2018, 02/12/2018, 02/10/2018, CT chest 02/08/2017

CLINICAL DATA: History of ETT

EXAM:
PORTABLE CHEST 1 VIEW

[chest]
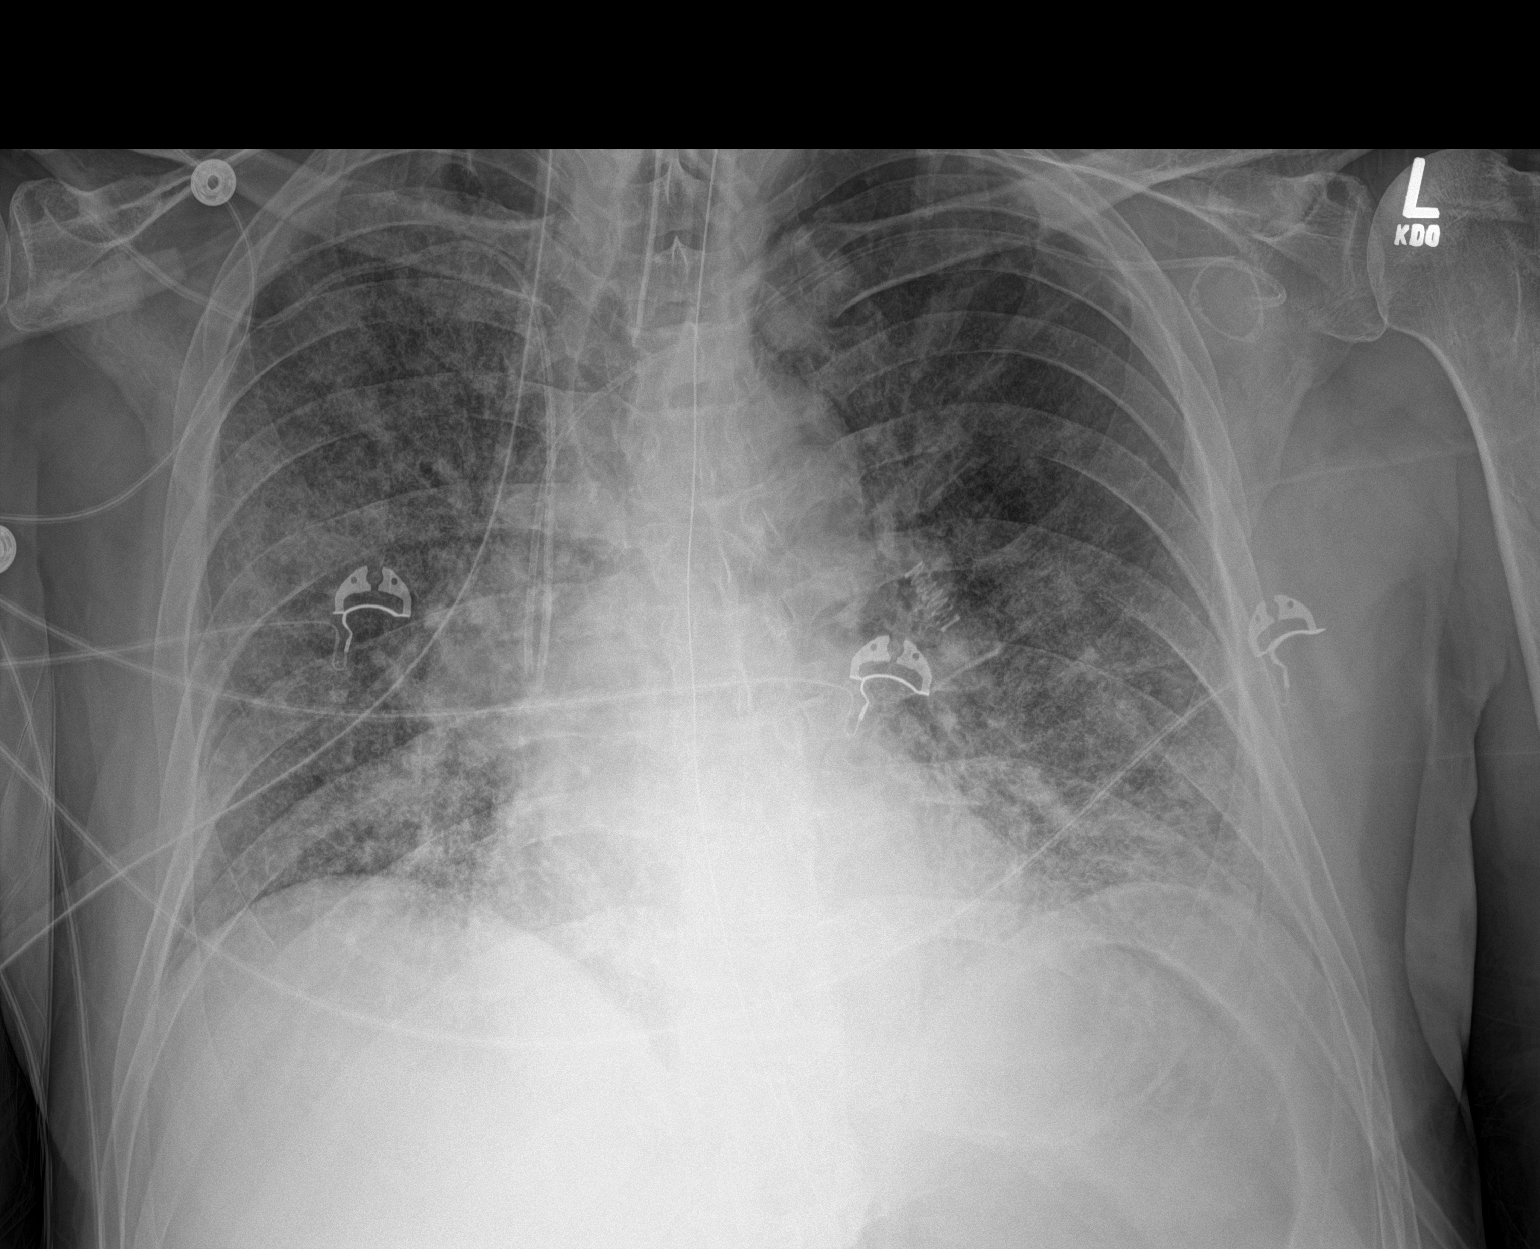

[1 of 1 positions shown; findings below may reference images not displayed]

FINDINGS: Endotracheal tube tip is about 4.5 cm superior to the carina.
Esophageal tube tip is below the diaphragm but non included.
Bilateral central venous catheter tips over the SVC. Stable
cardiomediastinal silhouette. Interval worsening of interstitial and
ground-glass opacity within the bilateral lungs. No pneumothorax.
IMPRESSION: 1. Endotracheal tube tip about 4.5 cm superior to the carina.
2. Slight worsening of diffuse bilateral interstitial and
ground-glass edema or infiltrates.

## 2020-08-23 ENCOUNTER — Ambulatory Visit: Admit: 2020-08-23 | Payer: Medicare (Managed Care)

## 2020-08-23 DIAGNOSIS — R69 Illness, unspecified: Secondary | ICD-10-CM

## 2020-08-23 LAB — MONONUCLEOSIS SCREEN: Infectious Mono: NEGATIVE

## 2021-10-09 NOTE — Unmapped (Signed)
CHIEF COMPLAINT  Chief Complaint   Patient presents with    Finger Injury       HPI  History obtained from patient  Andre Olson is a 71 y.o. male who presents to the emergency department with complaints of discomfort to his right pinky.  Patient tells me he was using an Building services engineer in the sewer when his glove got caught and his finger got twisted.  He tells me that he thinks he broke his finger.  He also complains of discomfort to his wrist stating that his hand got torqued while trying to stop the auger.  He tells me he is not up-to-date on tetanus.  He is requesting pain medication.  He otherwise has no other complaints or concerns at this time.    REVIEW OF SYSTEMS       Review of Systems   Musculoskeletal:  Positive for joint pain.       I have reviewed the nurse's notes, nurse's vital signs, and the nursing review of systems. 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    Chicken pox     Chronic pain     Congenital heart defect     COPD (chronic obstructive pulmonary disease) (HCC)     Coronary artery disease     Emphysema of lung (HCC)     GERD (gastroesophageal reflux disease)     Headache     migraines    Hyperlipidemia     Hypertension     Major depressive disorder     MRSA (methicillin resistant Staphylococcus aureus)     2014 nose    Mumps     Myasthenia gravis (HCC)     Myocardial infarction (HCC)     stent x1 dec 2015    Neuropathy     Orthostatic hypotension     PE (pulmonary thromboembolism) (HCC) 2018    Peripheral neuropathy     Pneumonia     2014    PTSD (post-traumatic stress disorder)     Restless leg syndrome     Right ankle pain 05/07/2019    Open Reduction Internal Fixation Right Trimalleolar Fracture     Rubeola     Sinus congestion     Sjoegren syndrome     STD (sexually transmitted disease)     past minor and treated pt does  not want to say what    Thyroid disease     Urinary tract infection        FAMILY HISTORY  No family history on file.    SOCIAL HISTORY  Social History     Socioeconomic History    Marital status: Single   Tobacco Use    Smoking status: Former     Packs/day: 0.50     Years: 10.00     Pack years: 5.00     Types: Cigarettes     Quit date: 05/15/2014     Years since quitting: 7.4    Smokeless tobacco: Never    Tobacco comments:     nicotine patches   Vaping Use    Vaping Use: Never used   Substance and Sexual Activity    Alcohol use: Yes     Alcohol/week: 5.0 standard drinks of alcohol     Types: 5 Cans of beer per  week     Comment: beer every other day    Drug use: No   Other Topics Concern    Daily Caffeine Intake ? No    Do you exercise regularly ? No       SURGICAL HISTORY  Past Surgical History:   Procedure Laterality Date    CARDIAC SURGERY      stents dec 2013-multi-LINK vision stent-spatial gradient <3300    CORONARY ANGIOPLASTY WITH STENT PLACEMENT  2013    x 1    EKG STRESS TEST-NUCLEAR MED  05/09/2016         INSERTION CENTRAL VENOUS ACCESS DEVICE W/ SUBCUTANEOUS PORT      left side march 2016    NEUROPLASTY / TRANSPOSITION ULNAR NERVE AT ELBOW      right arm    ORIF ANKLE FRACTURE Right 05/07/2019    Open Reduction Internal Fixation Right Trimalleolar Fracture -Dr. Delana Meyer    STRESS TEST LEXISCAN  12/14/2013         THYROIDECTOMY      TURP      urodynamics study  05/24/14       CURRENT MEDICATIONS  No current facility-administered medications for this encounter.     Current Outpatient Medications   Medication Sig Dispense Refill    albuterol (ACCUNEB) 1.25 mg/3 mL nebulizer solution Inhale 3 mLs (1.25 mg total) via nebulizer every 6 hours as needed for Wheezing. 60 vial 6    amoxicillin-clavulanate (AUGMENTIN) 875-125 mg tablet Take 1 tablet by mouth in the morning and 1 tablet before bedtime. Do all this for 7 days. 14 tablet 0    aspirin (ASPIR-81) 81 mg enteric coated tablet Take  1 tablet by mouth daily. 30 tablet 0    atorvastatin (LIPITOR) 40 mg tablet Take 40 mg by mouth daily.      azelastine (ASTELIN) 137 mcg (0.1 %) nasal spray 2 sprays into each nostril 2 times a day.      baclofen (LIORESAL) 10 mg tablet Take 10 mg by mouth every 8 hours.      buPROPion (WELLBUTRIN SR) 100 mg 12 hr tablet Take 100 mg by mouth 2 times daily.      calcium carbonate-vitamin D (OSCAL D) 500 mg(1,250mg ) -200 unit per tablet Take 1 tablet by mouth daily.      clopidogrel (PLAVIX) 75 mg tablet Take 1 tablet by mouth daily. 30 tablet 11    cyanocobalamin (B-12) 1,000 mcg tablet Take 1,000 mcg by mouth daily.      fluticasone (FLONASE) 50 mcg/actuation nasal spray 1 spray into each nostril 2 times daily.      HYDROcodone-acetaminophen (NORCO) 5-325 mg tablet Take 1 tablet by mouth every 8 hours as needed for Pain.      levothyroxine (SYNTHROID, LEVOTHROID) 200 mcg tablet Take 200 mcg by mouth daily.      montelukast (SINGULAIR) 10 mg tablet Take 10 mg by mouth at bedtime. Indications: Asthma Prevention      morphine (MSIR) 15 mg immediate release tablet Take 15 mg by mouth every 4 hours as needed for Pain.      multivitamin (THERAGRAN) tablet Take 1 tablet by mouth daily.      omeprazole (PRILOSEC) 40 mg delayed-release capsule Take 1 capsule by mouth every 12 hours. 60 capsule 0    promethazine (PHENERGAN) 12.5 mg tablet Take 12.5 mg by mouth every 6 hours as needed for Nausea.      pyridostigmine (MESTINON) 60 mg tablet Take 1 tablet by mouth  every morning. (Patient taking differently: Take 60 mg by mouth 4 times a day. Patient takes doses at the following times: 0800, 1100, 1400, and 1700) 90 tablet 0    tiotropium-olodaterol (STIOLTO RESPIMAT) 2.5-2.5 mcg/actuation Mist Inhale into the lungs.      topiramate (TOPAMAX) 15 mg delayed release capsule Take 15 mg by mouth 2 times daily.         ALLERGIES  Allergies   Allergen Reactions    Dilaudid [Hydromorphone (Bulk)] Other (See Comments)      hallucinations    Gammagard Other (See Comments)     Severe HTN - crisis    Lamotrigine Hives     Pt denies any allergy to this medication       PHYSICAL EXAM  VITAL SIGNS: BP (!) 160/91   Pulse 92   Temp 98.2 F (36.8 C)   Resp 18   Ht 6' (1.829 m)   Wt 165 lb (74.8 kg)   SpO2 97%   BMI 22.38 kg/m   CDC criteria BMI < 18.5 = underweight ; 18.5 to <25 = normal, 25.0 to <30= overweight range. 30 - 39 = obese . 40 or greater = severe obesity  Constitutional: Well developed, Well nourished, Mild distress initially, following observation appears more comfortable  HEENT: Normocephalic, Atraumatic, Bilateral external ears normal, Oropharynx moist, nose normal.   Eyes: PERRLA, EOMI, Conjunctiva normal, No discharge. No scleral icterus.  Neck: Normal range of motion  Cardio respiratory: No signs of cardiorespiratory stress, easy respirations  Skin: Warm, Dry, No erythema, No obvious rashes noted.   Extremities:  No cyanosis, No clubbing.   Musculoskeletal: Good range of motion in all major joints. No tenderness to palpation or major deformities noted with exception of the right fifth digit in which there is a curvilinear laceration to the flexor portion of the finger which is approximately 2.5 cm in length, no evidence of obvious foreign body, osseous or tendon structure, able to flex and extend.   Neurologic: GCS 15, no meningeal signs, alert & oriented x 3, Normal motor function, Normal sensory function, No focal deficits noted.   Psychiatric: Affect normal, Judgment normal, Mood normal, no evidence of obvious psychomotor agitation.       RADIOLOGY/PROCEDURES  XR-HAND RIGHT 3 OR MORE VIEWS   Final Result      No acute osseous abnormality. Mild degenerative joint disease.          Electronically Signed by: Vallarie Mare, MD, 10/09/2021 2:02 PM         XR-WRIST RIGHT 3 OR MORE VIEWS   Final Result      No acute osseous abnormality. Moderate degenerative joint disease.          Electronically Signed by: Vallarie Mare, MD, 10/09/2021 2:01 PM             I visualized the films and read the report(s) of the studies done during the patient's emergency department course.    LABS  No data to display             Digital block  Indication: Finger injury  Consent: Verbal  Description of procedure: After informed consent was obtained identifying the correct patient, site and procedure digital block was performed using approximately 10 mLs of 0.5% bupivacaine.  Patient tolerated procedure well.    Procedure Note:    Type: Laceration repair  Location: right 5th digit  Length: 2.5 cm  Suture count: #7 4-0 prolene  Consent: Verbal  After informed consent was obtained the area was thoroughly cleansed with normal saline.  A digital block was performed, following this the wound was thoroughly cleansed with normal saline.  The wound was explored, no obvious foreign body, tendon or osseous structure was noted.  The wound was reapproximated using # 7 4-0 prolene sutures.  Adequate hemostasis was obtained, patient tolerated procedure well and was bandaged appropriately.      COURSE & MEDICAL DECISION MAKING  Pertinent Labs & Imaging studies reviewed. (See chart for details)    The patient was thoroughly examined in the emergency department.  I did review the outside records.  Patient presents to the department with complaints of a laceration injury to his right pinky finger.  Patient tells me he was using an Building services engineer in his basement when his glove got caught in the auger twisting his finger.  He states that he did see bone initially.  He was updated on tetanus in the department.  He underwent a digital block followed by wound exploration and closure.  I was not able to visualize any obvious injury to the flexor tendon, he is able to flex without difficulty.  He was placed in aluminum finger splint as well as cock-up splint as he indicated he had some discomfort in his wrist.  He remains neurovascular intact following this.  Given his concern for  an injury to his tendon I strongly encouraged follow-up with orthopedic surgery.  He is encouraged to take his entire course of antibiotics as directed and return or seek care should he have further concerns.      Patient/family verbalized understanding and were amenable to the plan.  All of their questions and concerns were addressed.  Patient/family were informed of the results of any tests, a time was given to answer questions.  I have also discussed the pertinent findings on imaging where applicable and encouraged follow-up.  Patient/family were instructed to follow-up with primary care and to return or seek care should symptoms worsen or persist or should they have further concerns.      FINAL IMPRESSION    1. Laceration of finger, initial encounter    2. Wrist sprain, right, initial encounter                   Ardath Sax, MD  10/09/21 1430

## 2021-10-14 NOTE — Unmapped (Signed)
Associated Order(s): Laceration repair  Formatting of this note is different from the original.  Images from the original note were not included.  [facility]  ED Encounter Arrival Date: 10/14/21 1348  Andre Olson                            DOB: 09/19/1950  [address]  [facility] [address]   MRN: [id number]   CSN: [id number]     HAR: [id number]     EMERGENCY DEPARTMENT - GENERAL NOTE    CHIEF COMPLAINT    Chief Complaint   Patient presents with   ? Finger Injury     Pt cut his left thumb with a table saw just prior to arrival.      HPI    Andre Olson is a 71 year old male who presents to the emergency department for evaluation of left thumb injury which occurred just prior to emergency department arrival.  The patient reports that while he was working with his table saw he lost control and subsequently experienced a laceration of the left thumb.  Patient reports experiencing discomfort which he describes as sharp and currently rates 10 out of 10.  Incidentally the patient reports experiencing an injury of the fifth digit of right hand which occurred 1 week prior and is scheduled to see orthopedic hand service next week.  Patient currently reports experiencing no paresthesias, focal weakness, lightheadedness or dizziness.    REVIEW OF SYSTEMS    Constitutional: Negative for chills or fever  HENT: Negative for sore throat.    Eyes: Negative for visual disturbance   Respiratory: Negative for  shortness of breath.    Cardiovascular: Negative for palpitations.   Gastrointestinal: Negative for abdominal pain  Genitourinary: Negative for dysuria  Musculoskeletal: Negative for back pain.   Skin: See above HPI.  Neurological: Negative for focal weakness    PAST MEDICAL HISTORY    Past Medical History:   Diagnosis Date   ? AC (acromioclavicular) joint bone spurs    ? Anemia    ? CAD (coronary artery disease)    ? Chronic airway obstruction (HCC)    ? Chronic lung disease    ? COPD (chronic  obstructive pulmonary disease) (HCC)    ? Coronary atherosclerosis    ? Depressive disorder    ? Disorder of thyroid    ? Hyperlipidemia    ? Malignant neoplasm without specification of site [facility])     thyroid   ? Migraine    ? Myasthenia gravis (HCC)    ? Old myocardial infarction    ? Orthostatic hypotension    ? PTSD (post-traumatic stress disorder)    ? Rotator cuff (capsule) sprain      SURGICAL HISTORY    Past Surgical History:   Procedure Laterality Date   ? CARDIAC SURGERY     ? CORONARY ANGIOPLASTY WITH STENT PLACEMENT     ? PORTACATH PLACEMENT     ? THYROIDECTOMY     ? TRANSURETHRAL RESECTION OF PROSTATE       CURRENT MEDICATIONS    Prior to Admission medications    Medication Sig Start Date End Date Taking? Authorizing Provider   aspirin 81 MG TBEC Take 81 mg by mouth daily.   Yes HISTORICAL MED   pravastatin (PRAVACHOL) 40 MG TABS Take 40 mg by mouth daily.    HISTORICAL MED   cyanocobalamin 100 MCG TABS Take  1,000 mg by mouth.    HISTORICAL MED   azathioprine (IMURAN) 50 MG TABS Take 150 mg by mouth at bedtime.    HISTORICAL MED   montelukast (SINGULAIR) 10 MG TABS Take 10 mg by mouth at bedtime.    HISTORICAL MED   levothyroxine (SYNTHROID, LEVOTHROID) 200 MCG TABS Take 200 mcg by mouth daily.    HISTORICAL MED   Multiple Vitamins-Minerals (THERAGRAN-M ADVANCED PO) Take  by mouth.    HISTORICAL MED   budesonide-formoterol (SYMBICORT) 80-4.5 MCG/ACT AERO Use 2 puffs 2 (two) times daily.    HISTORICAL MED   fluticasone (FLONASE) 50 MCG/ACT SUSP Use 1 spray in each nostril daily.    HISTORICAL MED   OCUVITE-LUTEIN (OCUVITE-LUTEIN) CAPS Take 1 tablet by mouth daily.    HISTORICAL MED   calcium-vitamin D (OS-CAL 500 PLUS D) 500-200 MG-UNIT TABS Take 1 tablet by mouth daily.    HISTORICAL MED   buPROPion (WELLBUTRIN) 100 MG TABS Take 100 mg by mouth 2 (two) times daily.    HISTORICAL MED   tiotropium (SPIRIVA) 18 MCG CAPS Use 18 mcg daily.    HISTORICAL MED   TOPIRAMATE PO Take  by mouth.    HISTORICAL MED    pregabalin (LYRICA) 50 MG CAPS Take 50 mg by mouth 3 (three) times daily.    HISTORICAL MED   tramadol (ULTRAM) 50 MG TABS Take 50 mg by mouth every 6 (six) hours as needed for Moderate Pain (4-6).    HISTORICAL MED   pyridostigmine (MESTINON) 60 MG TABS Take 60 mg by mouth.    HISTORICAL MED   methocarbamol (ROBAXIN) 500 MG TABS Take 500 mg by mouth 4 (four) times daily.    HISTORICAL MED   omeprazole (PRILOSEC) 40 MG CPDR Take 40 mg by mouth daily.    HISTORICAL MED   hydrocodone-acetaminophen (NORCO) 5-325 MG TABS Take 1 tablet by mouth every 6 (six) hours as needed for Moderate Pain (4-6).    HISTORICAL MED   venlafaxine (EFFEXOR) 75 MG TABS Take 75 mg by mouth 2 (two) times daily.    HISTORICAL MED   immune globulin (Human) 5 GM/50ML SOLN 0.5 g/kg 0.5 g/kg by Intravenous route.    HISTORICAL MED   metoprolol succinate (TOPROL-XL) 25 MG TB24 Take 25 mg by mouth daily.    HISTORICAL MED     ALLERGIES    Allergies   Allergen Reactions   ? Dilaudid [Hydromorphone Hcl] Other (See Comments)     hallucinations     FAMILY HISTORY    Family History   Problem Relation Age of Onset   ? Cancer Other    ? Asthma Neg Hx    ? Diabetes Neg Hx    ? Hypertension Neg Hx      .  SOCIAL HISTORY    Social History     Socioeconomic History   ? Marital status: Divorced     Spouse name: Not on file   ? Number of children: Not on file   ? Years of education: Not on file   ? Highest education level: Not on file   Occupational History   ? Not on file   Tobacco Use   ? Smoking status: Former     Packs/day: 1.00     Years: 30.00     Pack years: 30.00     Types: Cigarettes     Quit date: 08/02/2014     Years since quitting: 7.2   ? Smokeless tobacco: Never  Substance and Sexual Activity   ? Alcohol use: Yes     Alcohol/week: 24.0 standard drinks     Types: 24 Cans of beer per week   ? Drug use: No   ? Sexual activity: Not on file   Other Topics Concern   ? Not on file   Social History Narrative   ? Not on file     Social Determinants of  Health     Financial Resource Strain: Not on file   Food Insecurity: Not on file   Transportation Needs: Not on file   Physical Activity: Not on file   Stress: Not on file   Social Connections: Not on file   Intimate Partner Violence: Not on file   Housing Stability: Not on file     PHYSICAL EXAM    VITAL SIGNS:  ED Triage Vitals   Enc Vitals Group      BP 10/14/21 1357 (!) 161/79      Pulse 10/14/21 1357 67      Resp --       Temp 10/14/21 1358 98 F (36.7 C)      Temp src 10/14/21 1358 Oral      SpO2 10/14/21 1357 97 %      Weight 10/14/21 1354 165 lb (74.8 kg)      Height 10/14/21 1354 6' 1 (1.854 m)      Head Circumference --       Peak Flow --       Pain Score 10/14/21 1354 10      Pain Loc --       Pain Edu? --       Excl. in GC? --      Physical Exam  Constitutional:       Appearance: Normal appearance.   Cardiovascular:      Rate and Rhythm: Normal rate and regular rhythm.      Pulses: Normal pulses.      Heart sounds: Normal heart sounds.   Pulmonary:      Effort: Pulmonary effort is normal.      Breath sounds: Normal breath sounds.   Abdominal:      Palpations: Abdomen is soft.      Tenderness: There is no abdominal tenderness.   Musculoskeletal:         General: Normal range of motion.   Skin:     General: Skin is warm and dry.      Findings: Laceration present.      Comments: Multiple lacerations of volar aspect of left thumb distal to IP joint.    Neurological:      General: No focal deficit present.      Mental Status: He is alert. Mental status is at baseline.   Psychiatric:         Mood and Affect: Mood normal.     Laceration repair    Date/Time: 10/14/2021 8:31 PM  Performed by: Bertram Savin, CNP  Authorized by: Fransisca Connors, MD     Consent:     Consent obtained:  Verbal    Consent given by:  Patient    Risks discussed:  Pain, poor cosmetic result, poor wound healing, infection and need for additional repair  Universal protocol:     Patient identity confirmed:  Verbally with patient  Anesthesia:      Anesthesia method:  Nerve block    Block location:  Ring block    Block needle gauge:  27 G  Block anesthetic:  Lidocaine 2% w/o epi    Block technique:  Ring block    Block injection procedure:  Anatomic landmarks identified, introduced needle, incremental injection and negative aspiration for blood    Block outcome:  Anesthesia achieved  Laceration details:     Location:  Finger    Finger location:  L thumb    Length (cm):  3    Depth (mm):  2  Pre-procedure details:     Preparation:  Patient was prepped and draped in usual sterile fashion and imaging obtained to evaluate for foreign bodies  Exploration:     Hemostasis achieved with:  Direct pressure    Wound exploration: wound explored through full range of motion and entire depth of wound visualized      Wound extent: underlying fracture      Wound extent: no tendon damage noted    Treatment:     Area cleansed with:  Saline and Shur-Clens    Amount of cleaning:  Standard  Skin repair:     Repair method:  Sutures    Suture size:  4-0    Suture material:  Prolene    Suture technique:  Simple interrupted    Number of sutures:  7  Approximation:     Approximation:  Close  Repair type:     Repair type:  Simple  Post-procedure details:     Dressing:  Antibiotic ointment and non-adherent dressing    Procedure completion:  Tolerated well, no immediate complications    LABORATORY : Ordered and Independently interpreted by Bertram Savin, CNP  No results found for this or any previous visit (from the past 24 hour(s)).    RADIOLOGY    XR FINGER LEFT DIGIT 1   Final Result       Fracture of the distal phalanx thumb                           Type of Fracture Care: Definitive    Fracture: Distal phalanx    yes : Manipulation not required, management is comparable to specialist care  yes :Patient was given pain management    yes ; Patient was instructed on potential complications, educated on fracture and care, and given specific fracture discharged instructions    yes :  There are no expectations that further treatment is needed for the fracture, but is sent for follow-up to assure that injury or displacement has not occurred during healing.    yes : Independently visualized and interpreted the image when performed.    yes : Included order for and application of immobilization device   Application of an aluminum/ foam splint was applied by nursing staff and post-splint application exam was performed by Bertram Savin, CNP    Fracture care was performed by Bertram Savin, CNP    ED COURSE    Pertinent Labs & Imaging studies reviewed. (See chart for details)    External RECORDS Review: PDMP reviewed.        The patient was given the following medication in the Emergency department:    Medication Administration from 10/14/2021 1348 to 10/14/2021 1625     Date/Time Order Dose Route Action Action by Comments    10/14/2021 1417 EDT hydrocodone-acetaminophen (NORCO) 5-325 mg per tablet 1 tablet 1 tablet Oral Given Stephanie Rump --    10/14/2021 1536 EDT ceFAZolin (ANCEF) 1 g in sodium chloride 0.9 % 50 mL IVPB (RN MIX) 1 g Intravenous  New Bag Richardean Chimera --    10/14/2021 1606 EDT ceFAZolin (ANCEF) 1 g in sodium chloride 0.9 % 50 mL IVPB (RN MIX) 0 g Intravenous Completed Richardean Chimera --    10/14/2021 1538 EDT bacitracin-polymyxin b (POLYSPORIN) ointment -- Topical Given Richardean Chimera --    10/14/2021 1619 EDT Heparin Sodium 100 unit/mL lock flush 200 Units 200 Units Intracatheter Given Richardean Chimera --       Vitals:    10/14/21 1354 10/14/21 1357 10/14/21 1358   BP:  (!) 161/79    Pulse:  67    Resp:   16   Temp:   98 F (36.7 C)   TempSrc:   Oral   SpO2:  97%    Weight: 74.8 kg (165 lb)     Height: 73 (185.4 cm)           Collateral history obtained from: Patient.    Consultations with health care professionals during ED stay: Patient case was discussed with the patient's orthopedic hand surgeon Dr. Delphina Cahill.  Per discussion given the patient's open fracture and poor visualization of  the flexor tendon patient will be seen in office in 1 day.  Patient will be provided with closure in the ED and prescription for antibiotics along with IV Ancef.    Medical Decision-Making: Patient is a 71 year old male presents emergency department for evaluation of laceration of left thumb.  This laceration was repaired per procedure documentation above however full closure was not obtained given the mechanism of injury as a result of contact with a table saw.  I was able to elicit full range of motion prior to the patient's nerve block and at this time have low suspicion for neurovascular injury.  Case was discussed with the orthopedic service after obtaining x-ray imaging which revealed a distal phalanx fracture.  I discussed with the orthopedic service that given the nature of the injury including open fracture prompt follow-up would be best suited for this patient.  Per discussion with the orthopedic service the patient will be seen in office the following day.  Patient was provided with Ancef and will be discharged home to self-care with wound care instructions and finger splint.  Patient agreed with above plan and disposition.    Leading Diagnosis and supporting evidence: Finger laceration secondary to table saw, open fracture of distal phalanx    Differential and Alternate diagnosis: Neurovascular injury, compartment syndrome    Social determinants of health and other health conditions related to this presentation: No pertinent social determinants of health or other health conditions related to this presentation    Discharge instructions and follow-up: Patient will see the orthopedic service with Dr. Delphina Cahill tomorrow morning.  Patient advised on continuation of his prescribed antibiotics.  Patient advised on continuation of his prescribed pain medication from his primary care provider.    Patient was given the signs and symptoms of worsening condition and told to return if they occurred.  Patient  understands the plan and management and all questions were answered.  Patient will follow up with his primary care physician in the next 2-3 days.  Patient was instructed on Culture results and preliminary radiology reads and the he would be contacted if a variance or positive test is obtained.  The nursing note was reviewed, agreed and appreciated.      Final Diagnosis:    1. Laceration of left thumb with complication, initial encounter    2. Open fracture of distal phalanx of digit  of left hand      The patient was given the following medications to go home with:      Medication List     ASK your doctor about these medications    aspirin 81 MG Tbec    azaTHIOprine 50 MG Tabs  Commonly known as: IMURAN    budesonide-formoterol 80-4.5 MCG/ACT Aero  Commonly known as: SYMBICORT    buPROPion 100 MG Tabs  Commonly known as: WELLBUTRIN    calcium-vitamin D 500-200 MG-UNIT Tabs  Commonly known as: OS-CAL 500 PLUS D    cyanocobalamin 100 MCG Tabs    fluticasone 50 mcg/act nasal spray  Commonly known as: FLONASE    hydrocodone-acetaminophen 5-325 mg Tabs  Commonly known as: NORCO    immune globulin (Human) 5 GM/50ML SOLN 0.5 g/kg    levothyroxine 200 MCG Tabs  Commonly known as: SYNTHROID, LEVOTHROID    methocarbamol 500 MG Tabs  Commonly known as: ROBAXIN    metoprolol succinate 25 mg extended-release tablet  Commonly known as: TOPROL-XL    montelukast 10 MG Tabs  Commonly known as: SINGULAIR    omeprazole 40 MG Cpdr  Commonly known as: PRILOSEC    pravastatin 40 MG Tabs  Commonly known as: PRAVACHOL    pregabalin 50 MG Caps  Commonly known as: LYRICA    pyridostigmine 60 MG Tabs  Commonly known as: MESTINON    * THERAGRAN-M ADVANCED PO    * Ocuvite-Lutein Caps    tiotropium 18 MCG Caps  Commonly known as: SPIRIVA    TOPIRAMATE PO    traMADol 50 MG Tabs  Commonly known as: ULTRAM    venlafaxine 75 MG Tabs  Commonly known as: EFFEXOR       * This list has 2 medication(s) that are the same as other medications prescribed  for you. Read the directions carefully, and ask your doctor or other care provider to review them with you.           Follow-up Information     Follow up With Specialties Details Why Contact Info    Sibyl Parr, DO Orthopedic Surgery Schedule an appointment as soon as possible for a visit   [address] [place] [address]  [phone]        Please note that some or all of this record was generated using voice recognition software. If there are any questions about the content of this document, please contact the author as some errors in transcription may have occurred.      Bertram Savin, CNP  10/14/21 2042      Fransisca Connors, MD  10/21/21 2000    Electronically signed by Fransisca Connors, MD at 10/21/2021  8:00 PM EDT    Associated attestation - Fransisca Connors, MD - 10/21/2021  8:00 PM EDT  Formatting of this note might be different from the original.  EMERGENCY DEPARTMENT - ATTENDING NOTE  Patient has a chief complaint of left thumb lacerations   General patient is in no apparent distress  HEENT:mucous membranes are moist  Eyes: no scleral icterus  Neck: the neck was supple  Cardiovascular: the heart was regular rate and rhythm  Respiratory: the lungs are clear to auscultation bilaterally  Integument: Patient has multiple lacerations to the left thumb      I have personally seen and examined this patient. I have fully participated in the care of this patient with the resident/MLP.  I have reviewed and agree with all pertinent clinical information  including history, physical exam, labs, radiographic studies and the plan. I have also reviewed and agree with the medications, allergies and past medical history sections for this patient.    I have performed the substantive part of the Physical Exam in this patient encounter.  Patient was referred to orthopedics        Electronically signed by:   Fransisca Connors, MD

## 2021-10-14 NOTE — Unmapped (Signed)
Formatting of this note might be different from the original.  CURRENT STATUS IS EMERGENCY .     :    Any questions regarding PATIENT CLASS/STATUS should be directed to the Care Management Departments:  [facility] [phone] or [facility] [phone] or [facility]  [phone].  Electronically signed by Not[employer], [facility], MD at 10/15/2021 10:12 AM EDT

## 2021-11-12 NOTE — Unmapped (Signed)
Office Progress Note     Chief Complaint   Patient presents with    Thumb Pain     Left thumb avulsion fx follow up        HPI:   Andre Olson is a 71 y.o. male who presents here today to follow up for an avulsion fracture to the left thumb.  X-rays were taken today in the office.  He has questions about the nail regrowth because its becoming irritation to his thumb.  The pain is described as a little better.  Unless its bumped then pain is higher.  The pain is rated as 4/10    Medications:     Current Outpatient Medications   Medication Sig Dispense Refill    albuterol (ACCUNEB) 1.25 mg/3 mL nebulizer solution Inhale 3 mLs (1.25 mg total) via nebulizer every 6 hours as needed for Wheezing. 60 vial 6    aspirin (ASPIR-81) 81 mg enteric coated tablet Take 1 tablet by mouth daily. 30 tablet 0    atorvastatin (LIPITOR) 40 mg tablet Take 1 tablet (40 mg total) by mouth daily.      azelastine (ASTELIN) 137 mcg (0.1 %) nasal spray 2 sprays into each nostril in the morning and 2 sprays before bedtime.      baclofen (LIORESAL) 10 mg tablet Take 1 tablet (10 mg total) by mouth every 8 hours.      buPROPion (WELLBUTRIN SR) 100 mg 12 hr tablet Take 1 tablet (100 mg total) by mouth in the morning and 1 tablet (100 mg total) before bedtime.      calcium carbonate-vitamin D (OSCAL D) 500 mg(1,250mg ) -200 unit per tablet Take 1 tablet by mouth daily.      clopidogrel (PLAVIX) 75 mg tablet Take 1 tablet by mouth daily. 30 tablet 11    cyanocobalamin (B-12) 1,000 mcg tablet Take 1 tablet (1,000 mcg total) by mouth daily.      fluticasone (FLONASE) 50 mcg/actuation nasal spray 1 spray into each nostril in the morning and 1 spray before bedtime.      HYDROcodone-acetaminophen (NORCO) 5-325 mg tablet Take 1 tablet by mouth every 8 hours as needed for Pain.      levothyroxine (SYNTHROID, LEVOTHROID) 200 mcg tablet Take 1 tablet (200 mcg total) by mouth daily.      montelukast (SINGULAIR) 10 mg tablet Take 1 tablet (10  mg total) by mouth at bedtime. Indications: asthma prevention      morphine (MSIR) 15 mg immediate release tablet Take 1 tablet (15 mg total) by mouth every 4 hours as needed for Pain.      multivitamin (THERAGRAN) tablet Take 1 tablet by mouth daily.      omeprazole (PRILOSEC) 40 mg delayed-release capsule Take 1 capsule by mouth every 12 hours. 60 capsule 0    promethazine (PHENERGAN) 12.5 mg tablet Take 1 tablet (12.5 mg total) by mouth every 6 hours as needed for Nausea.      pyridostigmine (MESTINON) 60 mg tablet Take 1 tablet by mouth every morning. (Patient taking differently: Take 1 tablet (60 mg total) by mouth 4 times a day. Patient takes doses at the following times: 0800, 1100, 1400, and 1700) 90 tablet 0    tiotropium-olodaterol (STIOLTO RESPIMAT) 2.5-2.5 mcg/actuation Mist Inhale into the lungs.      topiramate (TOPAMAX) 15 mg delayed release capsule Take 1 capsule (15 mg total) by mouth in the morning and 1 capsule (15 mg total) before bedtime.  No current facility-administered medications for this visit.      Allergies:     Allergies   Allergen Reactions    Dilaudid [Hydromorphone (Bulk)] Other (See Comments)     hallucinations    Gammagard Other (See Comments)     Severe HTN - crisis    Lamotrigine Hives     Pt denies any allergy to this medication        Review of Systems:  Constitutional: Negative for fever, chills, fatigue and unexpected weight change.   HENT: Negative for hearing loss, sore throat and facial swelling.    Eyes: Negative for pain and discharge.   Respiratory: Negative for cough and shortness of breath.    Cardiovascular: Negative for chest pain.   Gastrointestinal: Negative for nausea, vomiting, diarrhea and constipation.   Musculoskeletal: noted in HPI   Skin: Negative for pallor and rash.   Neurological: Negative for seizures, syncope and numbness.   Hematological: Does not bruise/bleed easily.   Psychiatric/Behavioral: Negative for behavioral problems. The  patient is not nervous/anxious.      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    Chicken pox     Chronic pain     Congenital heart defect     COPD (chronic obstructive pulmonary disease) (HCC)     Coronary artery disease     Emphysema of lung (HCC)     Fractures 2020    Right ankle    GERD (gastroesophageal reflux disease)     Headache     migraines    Hyperlipidemia     Hypertension     Hypothyroidism 1981    Thyroidectomy    Major depressive disorder     MRSA (methicillin resistant Staphylococcus aureus)     2014 nose    Mumps     Myasthenia gravis (HCC)     Myocardial infarction (HCC)     stent x1 dec 2015    Neuropathy     Orthostatic hypotension     Osteoarthritis     Osteoporosis     PE (pulmonary thromboembolism) (HCC) 2018    Peripheral neuropathy     Pneumonia     2014    PTSD (post-traumatic stress disorder)     Restless leg syndrome     Right ankle pain 05/07/2019    Open Reduction Internal Fixation Right Trimalleolar Fracture     Rubeola     Sinus congestion     Sjoegren syndrome     STD (sexually transmitted disease)     past minor and treated pt does not want to say what    Thyroid disease     Urinary tract infection        Past Surgical History:   Procedure Laterality Date    ANKLE FRACTURE SURGERY  2020    CARDIAC SURGERY      stents dec 2013-multi-LINK vision stent-spatial gradient <3300    CORONARY ANGIOPLASTY WITH STENT PLACEMENT  2013    x 1    EKG STRESS TEST-NUCLEAR MED  05/09/2016         ELBOW SURGERY      FOOT FRACTURE SURGERY  November 2020    HAND SURGERY  2023    INSERTION CENTRAL VENOUS ACCESS DEVICE W/ SUBCUTANEOUS PORT      left side march 2016    NEUROPLASTY / TRANSPOSITION ULNAR  NERVE AT ELBOW      right arm    ORIF ANKLE FRACTURE Right 05/07/2019    Open Reduction Internal Fixation Right Trimalleolar Fracture -Dr. Delana Meyer    SHOULDER ARTHROSCOPY  2017     SHOULDER SURGERY  2017    STRESS TEST LEXISCAN  12/14/2013         THYROIDECTOMY      TURP      urodynamics study  05/24/2014       Social History     Socioeconomic History    Marital status: Single     Spouse name: Not on file    Number of children: Not on file    Years of education: Not on file    Highest education level: Not on file   Occupational History    Not on file   Tobacco Use    Smoking status: Former     Packs/day: 2.00     Years: 25.00     Pack years: 50.00     Types: Cigarettes, Pipe, Cigars     Quit date: 02/06/2018     Years since quitting: 3.7     Passive exposure: Never    Smokeless tobacco: Never    Tobacco comments:     Smoked up to 2 packs a day for over 20 years.   Vaping Use    Vaping Use: Never used   Substance and Sexual Activity    Alcohol use: Yes     Alcohol/week: 21.0 standard drinks of alcohol     Types: 21 Cans of beer per week     Comment: beer every other day    Drug use: No    Sexual activity: Not Currently     Partners: Female     Birth control/protection: None   Other Topics Concern    Daily Caffeine Intake ? No    Do you exercise regularly ? No   Social History Narrative    Not on file     Social Determinants of Health     Financial Resource Strain: Not on file   Food Insecurity: Not on file   Transportation Needs: Not on file   Physical Activity: Not on file   Stress: Not on file   Social Connections: Not on file   Intimate Partner Violence: Not on file   Housing Stability: Not on file       Family History   Problem Relation Age of Onset    Cancer Father         Lung cancer from smoking    Heart disease Father        Physical Exam:     There were no vitals taken for this visit.   Physical Exam   Nursing note and vitals reviewed.  Constitutional: Oriented to person, place, and time. Appears well-developed and well-nourished. No distress.   Head: Normocephalic and atraumatic.   Eyes: Conjunctivae and EOM are normal.   Pulmonary/Chest: Effort normal. No  respiratory distress.   Neurological: Alert and oriented to person, place, and time.   Skin: Skin is warm and dry. No rash noted.   Psychiatric: Normal mood and affect. Behavior is normal  Musculoskeletal: Healing laceration on the volar aspect of the left thumb.  Nail appears to be healing well.  Normalizing range of motion of the interphalangeal joint.      Imaging:   POCT XR-FINGER/THUMB LEFT    Result Date: 11/12/2021  IMPRESSION: Interval healing left distal phalanx of the  thumb.  No change in alignment of the fracture fragments.           Impression/Plan:       ICD-10-CM    1. Avulsion of skin of left thumb, initial encounter  S61.002A POCT XR-FINGER/THUMB LEFT          I had a long discussion with the patient regarding the above diagnosis.  We discussed his progress.  Fracture appears to be healing well.  There has been no change in alignment.  We discussed continuing to advance his activities.  Follow-up with dermatology regarding the health of the nail.  We will follow-up in about 6 weeks for a final check of the thumb with x-rays at that time.        Electronically signed by: Cornelius Moras Day, PA-C, DPT

## 2021-12-31 NOTE — Unmapped (Signed)
Office Progress Note     Chief Complaint   Patient presents with    Thumb Pain     Left thumb fx and right pinky pain        HPI:   Andre Olson is a 71 y.o. male who presents here today to follow up for the left thumb fracture.  He still has some numbness and tingling in the tip of the finger.  The nail is growing back.  But the right pinky finger still hurts is swelling in the joint and has trouble straightening it.  He feels its fractured.   The pain is rated as 2-3/10 for the left thumb and a 10/10 for the right pinky if he hits it against something.     Medications:     Current Outpatient Medications   Medication Sig Dispense Refill    albuterol (ACCUNEB) 1.25 mg/3 mL nebulizer solution Inhale 3 mLs (1.25 mg total) via nebulizer every 6 hours as needed for Wheezing. (Patient not taking: Reported on 12/31/2021) 60 vial 6    aspirin (ASPIR-81) 81 mg enteric coated tablet Take 1 tablet by mouth daily. 30 tablet 0    atorvastatin (LIPITOR) 40 mg tablet Take 1 tablet (40 mg total) by mouth daily.      azelastine (ASTELIN) 137 mcg (0.1 %) nasal spray 2 sprays into each nostril in the morning and 2 sprays before bedtime.      baclofen (LIORESAL) 10 mg tablet Take 1 tablet (10 mg total) by mouth in the morning and 1 tablet (10 mg total) at noon and 1 tablet (10 mg total) before bedtime.      baclofen (LIORESAL) 10 mg tablet Take 1 tablet (10 mg total) by mouth every 8 hours.      buPROPion (WELLBUTRIN SR) 100 mg 12 hr tablet Take 1 tablet (100 mg total) by mouth in the morning and 1 tablet (100 mg total) before bedtime.      calcium carbonate-vitamin D (OSCAL D) 500 mg(1,250mg ) -200 unit per tablet Take 1 tablet by mouth daily.      clopidogrel (PLAVIX) 75 mg tablet Take 1 tablet by mouth daily. 30 tablet 11    cyanocobalamin (B-12) 1,000 mcg tablet Take 1 tablet (1,000 mcg total) by mouth daily.      fluticasone (FLONASE) 50 mcg/actuation nasal spray 1 spray into each nostril in the morning and 1 spray  before bedtime.      gabapentin (NEURONTIN) 100 mg capsule TK ONE C PO TID      HYDROcodone-acetaminophen (NORCO) 5-325 mg tablet Take 1 tablet by mouth every 8 hours as needed for Pain.      levoFLOXacin (LEVAQUIN) 500 mg tablet       levothyroxine (SYNTHROID, LEVOTHROID) 200 mcg tablet Take 1 tablet (200 mcg total) by mouth daily.      LORazepam (ATIVAN) 1 mg tablet       montelukast (SINGULAIR) 10 mg tablet Take 1 tablet (10 mg total) by mouth at bedtime. Indications: asthma prevention      morphine (MSIR) 15 mg immediate release tablet Take 1 tablet (15 mg total) by mouth every 4 hours as needed for Pain.      multivitamin (THERAGRAN) tablet Take 1 tablet by mouth daily.      omeprazole (PRILOSEC) 40 mg delayed-release capsule Take 1 capsule by mouth every 12 hours. 60 capsule 0    promethazine (PHENERGAN) 12.5 mg tablet Take 1 tablet (12.5 mg total) by mouth every 6 hours as  needed for Nausea.      pyridostigmine (MESTINON) 60 mg tablet Take 1 tablet by mouth every morning. (Patient taking differently: Take 1 tablet (60 mg total) by mouth 4 times a day. Patient takes doses at the following times: 0800, 1100, 1400, and 1700) 90 tablet 0    tiotropium-olodaterol (STIOLTO RESPIMAT) 2.5-2.5 mcg/actuation Mist Inhale into the lungs.      topiramate (TOPAMAX) 15 mg delayed release capsule Take 1 capsule (15 mg total) by mouth in the morning and 1 capsule (15 mg total) before bedtime.       No current facility-administered medications for this visit.      Allergies:     Allergies   Allergen Reactions    Dilaudid [Hydromorphone (Bulk)] Other (See Comments)     hallucinations    Gammagard Other (See Comments)     Severe HTN - crisis    Lamotrigine Hives     Pt denies any allergy to this medication        Review of Systems:  Constitutional: Negative for fever, chills, fatigue and unexpected weight change.   HENT: Negative for hearing loss, sore throat and facial swelling.    Eyes: Negative for pain and  discharge.   Respiratory: Negative for cough and shortness of breath.    Cardiovascular: Negative for chest pain.   Gastrointestinal: Negative for nausea, vomiting, diarrhea and constipation.   Musculoskeletal: noted in HPI   Skin: Negative for pallor and rash.   Neurological: Negative for seizures, syncope and numbness.   Hematological: Does not bruise/bleed easily.   Psychiatric/Behavioral: Negative for behavioral problems. The patient is not nervous/anxious.      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    Chicken pox     Chronic pain     Congenital heart defect     COPD (chronic obstructive pulmonary disease) (HCC)     Coronary artery disease     Emphysema of lung (HCC)     Fractures 2020    Right ankle    GERD (gastroesophageal reflux disease)     Headache     migraines    Hyperlipidemia     Hypertension     Hypothyroidism 1981    Thyroidectomy    Major depressive disorder     MRSA (methicillin resistant Staphylococcus aureus)     2014 nose    Mumps     Myasthenia gravis (HCC)     Myocardial infarction (HCC)     stent x1 dec 2015    Neuropathy     Orthostatic hypotension     Osteoarthritis     Osteoporosis     PE (pulmonary thromboembolism) (HCC) 2018    Peripheral neuropathy     Pneumonia     2014    PTSD (post-traumatic stress disorder)     Restless leg syndrome     Right ankle pain 05/07/2019    Open Reduction Internal Fixation Right Trimalleolar Fracture     Rubeola     Sinus congestion     Sjoegren syndrome     STD (sexually transmitted disease)     past minor and treated pt does not want to say what    Thyroid disease     Urinary tract infection        Past Surgical History:   Procedure Laterality Date    ANKLE  FRACTURE SURGERY  2020    CARDIAC SURGERY      stents dec 2013-multi-LINK vision stent-spatial gradient <3300    CORONARY ANGIOPLASTY WITH STENT PLACEMENT  2013    x 1    EKG  STRESS TEST-NUCLEAR MED  05/09/2016         ELBOW SURGERY      FOOT FRACTURE SURGERY  November 2020    HAND SURGERY  2023    INSERTION CENTRAL VENOUS ACCESS DEVICE W/ SUBCUTANEOUS PORT      left side march 2016    NEUROPLASTY / TRANSPOSITION ULNAR NERVE AT ELBOW      right arm    ORIF ANKLE FRACTURE Right 05/07/2019    Open Reduction Internal Fixation Right Trimalleolar Fracture -Dr. Delana Meyer    SHOULDER ARTHROSCOPY  2017    SHOULDER SURGERY  2017    STRESS TEST LEXISCAN  12/14/2013         THYROIDECTOMY      TURP      urodynamics study  05/24/2014       Social History     Socioeconomic History    Marital status: Single     Spouse name: Not on file    Number of children: Not on file    Years of education: Not on file    Highest education level: Not on file   Occupational History    Not on file   Tobacco Use    Smoking status: Former     Packs/day: 2.00     Years: 25.00     Pack years: 50.00     Types: Cigarettes, Pipe, Cigars     Quit date: 02/06/2018     Years since quitting: 3.9     Passive exposure: Never    Smokeless tobacco: Never    Tobacco comments:     Smoked up to 2 packs a day for over 20 years.   Vaping Use    Vaping Use: Never used   Substance and Sexual Activity    Alcohol use: Yes     Alcohol/week: 21.0 standard drinks of alcohol     Types: 21 Cans of beer per week     Comment: beer every other day    Drug use: No    Sexual activity: Not Currently     Partners: Female     Birth control/protection: None   Other Topics Concern    Daily Caffeine Intake ? No    Do you exercise regularly ? No   Social History Narrative    Not on file     Social Determinants of Health     Financial Resource Strain: Not on file   Food Insecurity: Not on file   Transportation Needs: Not on file   Physical Activity: Not on file   Stress: Not on file   Social Connections: Not on file   Intimate Partner Violence: Not on file   Housing Stability: Not on file       Family History   Problem Relation Age of  Onset    Cancer Father         Lung cancer from smoking    Heart disease Father        Physical Exam:     Ht 6' (1.829 m)   Wt 165 lb (74.8 kg)   BMI 22.38 kg/m    Physical Exam   Nursing note and vitals reviewed.  Constitutional: Oriented to person, place, and time. Appears well-developed and well-nourished. No  distress.   Head: Normocephalic and atraumatic.   Eyes: Conjunctivae and EOM are normal.   Pulmonary/Chest: Effort normal. No respiratory distress.   Neurological: Alert and oriented to person, place, and time.   Skin: Skin is warm and dry. No rash noted.   Psychiatric: Normal mood and affect. Behavior is normal  Musculoskeletal: There is no significant tenderness about the left thumb.  Nailbed appears to be healing well.  Range of motion is normalized in the left hand.  Examination of the right hand reveals tenderness to palpation over the PIP joint of the right small finger.  There is pain and loss of motion with flexion.  Strength is 4/5.  Sensation and vascular exams are grossly normal.      Imaging:   POCT XR-FINGER/THUMB RIGHT    Result Date: 12/31/2021  IMPRESSION: X-rays of the right fifth digit reveal no evidence of fracture, dislocation, or pathological process.    POCT XR-FINGER/THUMB LEFT    Result Date: 12/31/2021  IMPRESSION: Interval healing of left thumb distal phalanx fracture with unchanged alignment compared to previous.           Impression/Plan:       ICD-10-CM    1. Closed fracture of tuft of distal phalanx of left thumb  S62.522A       2. Avulsion of skin of left thumb, initial encounter  S61.002A POCT XR-FINGER/THUMB LEFT      3. Pain in right finger(s)  M79.644 POCT XR-FINGER/THUMB RIGHT      4. Closed displaced fracture of proximal phalanx of right little finger, initial encounter  S62.616A MRI-HAND RIGHT WO CONTRAST          I had a long discussion with the patient regarding the above diagnosis.  His left thumb fracture appears to be healing well.  He is not having any  discomfort at this point.  He has lost range of motion in the right small finger at the PIP joint and has significant ongoing pain in this area.  2 sets of negative x-rays have been performed.  At this point I would like to send him for an MRI to further assess for occult fracture.  We will follow-up in the office after the MRI to review the results and plan further management.        Electronically signed by: Franky Macho Day, PA-C

## 2022-01-09 NOTE — Unmapped (Signed)
Office Progress Note     Chief Complaint   Patient presents with    Results     MRI results right hand         HPI:   Andre Olson is a 71 y.o. male who presents here today to follow up due to right hand mri results .  He went to [facility] for th scan The pain is described as unchanged.  The pain is rated as 2/10    Medications:     Current Outpatient Medications   Medication Sig Dispense Refill    albuterol (ACCUNEB) 1.25 mg/3 mL nebulizer solution Inhale 3 mLs (1.25 mg total) via nebulizer every 6 hours as needed for Wheezing. (Patient not taking: Reported on 12/31/2021) 60 vial 6    aspirin (ASPIR-81) 81 mg enteric coated tablet Take 1 tablet by mouth daily. 30 tablet 0    atorvastatin (LIPITOR) 40 mg tablet Take 1 tablet (40 mg total) by mouth daily.      azelastine (ASTELIN) 137 mcg (0.1 %) nasal spray 2 sprays into each nostril in the morning and 2 sprays before bedtime.      baclofen (LIORESAL) 10 mg tablet Take 1 tablet (10 mg total) by mouth in the morning and 1 tablet (10 mg total) at noon and 1 tablet (10 mg total) before bedtime.      baclofen (LIORESAL) 10 mg tablet Take 1 tablet (10 mg total) by mouth every 8 hours.      buPROPion (WELLBUTRIN SR) 100 mg 12 hr tablet Take 1 tablet (100 mg total) by mouth in the morning and 1 tablet (100 mg total) before bedtime.      calcium carbonate-vitamin D (OSCAL D) 500 mg(1,250mg ) -200 unit per tablet Take 1 tablet by mouth daily.      clopidogrel (PLAVIX) 75 mg tablet Take 1 tablet by mouth daily. 30 tablet 11    cyanocobalamin (B-12) 1,000 mcg tablet Take 1 tablet (1,000 mcg total) by mouth daily.      diazePAM (VALIUM) 5 mg tablet Take 1 hour prior to MRI 1 tablet 0    fluticasone (FLONASE) 50 mcg/actuation nasal spray 1 spray into each nostril in the morning and 1 spray before bedtime.      gabapentin (NEURONTIN) 100 mg capsule TK ONE C PO TID      HYDROcodone-acetaminophen (NORCO) 5-325 mg tablet Take 1 tablet by mouth every 8 hours as needed for  Pain.      levoFLOXacin (LEVAQUIN) 500 mg tablet       levothyroxine (SYNTHROID, LEVOTHROID) 200 mcg tablet Take 1 tablet (200 mcg total) by mouth daily.      LORazepam (ATIVAN) 1 mg tablet       montelukast (SINGULAIR) 10 mg tablet Take 1 tablet (10 mg total) by mouth at bedtime. Indications: asthma prevention      morphine (MSIR) 15 mg immediate release tablet Take 1 tablet (15 mg total) by mouth every 4 hours as needed for Pain.      multivitamin (THERAGRAN) tablet Take 1 tablet by mouth daily.      omeprazole (PRILOSEC) 40 mg delayed-release capsule Take 1 capsule by mouth every 12 hours. 60 capsule 0    promethazine (PHENERGAN) 12.5 mg tablet Take 1 tablet (12.5 mg total) by mouth every 6 hours as needed for Nausea.      pyridostigmine (MESTINON) 60 mg tablet Take 1 tablet by mouth every morning. (Patient taking differently: Take 1 tablet (60 mg total) by mouth 4  times a day. Patient takes doses at the following times: 0800, 1100, 1400, and 1700) 90 tablet 0    tiotropium-olodaterol (STIOLTO RESPIMAT) 2.5-2.5 mcg/actuation Mist Inhale into the lungs.      topiramate (TOPAMAX) 15 mg delayed release capsule Take 1 capsule (15 mg total) by mouth in the morning and 1 capsule (15 mg total) before bedtime.       No current facility-administered medications for this visit.      Allergies:     Allergies   Allergen Reactions    Dilaudid [Hydromorphone (Bulk)] Other (See Comments)     hallucinations    Gammagard Other (See Comments)     Severe HTN - crisis    Lamotrigine Hives     Pt denies any allergy to this medication        Review of Systems:  Constitutional: Negative for fever, chills, fatigue and unexpected weight change.   HENT: Negative for hearing loss, sore throat and facial swelling.    Eyes: Negative for pain and discharge.   Respiratory: Negative for cough and shortness of breath.    Cardiovascular: Negative for chest pain.   Gastrointestinal: Negative for nausea, vomiting, diarrhea and  constipation.   Musculoskeletal: noted in HPI   Skin: Negative for pallor and rash.   Neurological: Negative for seizures, syncope and numbness.   Hematological: Does not bruise/bleed easily.   Psychiatric/Behavioral: Negative for behavioral problems. The patient is not nervous/anxious.      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    Chicken pox     Chronic pain     Congenital heart defect     COPD (chronic obstructive pulmonary disease) (HCC)     Coronary artery disease     Emphysema of lung (HCC)     Fractures 2020    Right ankle    GERD (gastroesophageal reflux disease)     Headache     migraines    Hyperlipidemia     Hypertension     Hypothyroidism 1981    Thyroidectomy    Major depressive disorder     MRSA (methicillin resistant Staphylococcus aureus)     2014 nose    Mumps     Myasthenia gravis (HCC)     Myocardial infarction (HCC)     stent x1 dec 2015    Neuropathy     Orthostatic hypotension     Osteoarthritis     Osteoporosis     PE (pulmonary thromboembolism) (HCC) 2018    Peripheral neuropathy     Pneumonia     2014    PTSD (post-traumatic stress disorder)     Restless leg syndrome     Right ankle pain 05/07/2019    Open Reduction Internal Fixation Right Trimalleolar Fracture     Rubeola     Sinus congestion     Sjoegren syndrome     STD (sexually transmitted disease)     past minor and treated pt does not want to say what    Thyroid disease     Urinary tract infection        Past Surgical History:   Procedure Laterality Date    ANKLE FRACTURE SURGERY  2020    CARDIAC SURGERY      stents dec 2013-multi-LINK vision stent-spatial gradient <3300    CORONARY ANGIOPLASTY WITH STENT PLACEMENT  2013  x 1    EKG STRESS TEST-NUCLEAR MED  05/09/2016         ELBOW SURGERY      FOOT FRACTURE SURGERY  November 2020    HAND SURGERY  2023    INSERTION CENTRAL VENOUS ACCESS DEVICE W/  SUBCUTANEOUS PORT      left side march 2016    NEUROPLASTY / TRANSPOSITION ULNAR NERVE AT ELBOW      right arm    ORIF ANKLE FRACTURE Right 05/07/2019    Open Reduction Internal Fixation Right Trimalleolar Fracture -Dr. Delana Meyer    SHOULDER ARTHROSCOPY  2017    SHOULDER SURGERY  2017    STRESS TEST LEXISCAN  12/14/2013         THYROIDECTOMY      TURP      urodynamics study  05/24/2014       Social History     Socioeconomic History    Marital status: Single     Spouse name: Not on file    Number of children: Not on file    Years of education: Not on file    Highest education level: Not on file   Occupational History    Not on file   Tobacco Use    Smoking status: Former     Packs/day: 2.00     Years: 25.00     Pack years: 50.00     Types: Cigarettes, Pipe, Cigars     Quit date: 02/06/2018     Years since quitting: 3.9     Passive exposure: Never    Smokeless tobacco: Never    Tobacco comments:     Smoked up to 2 packs a day for over 20 years.   Vaping Use    Vaping Use: Never used   Substance and Sexual Activity    Alcohol use: Yes     Alcohol/week: 21.0 standard drinks of alcohol     Types: 21 Cans of beer per week     Comment: beer every other day    Drug use: No    Sexual activity: Not Currently     Partners: Female     Birth control/protection: None   Other Topics Concern    Daily Caffeine Intake ? No    Do you exercise regularly ? No   Social History Narrative    Not on file     Social Determinants of Health     Financial Resource Strain: Not on file   Food Insecurity: Not on file   Transportation Needs: Not on file   Physical Activity: Not on file   Stress: Not on file   Social Connections: Not on file   Intimate Partner Violence: Not on file   Housing Stability: Not on file       Family History   Problem Relation Age of Onset    Cancer Father         Lung cancer from smoking    Heart disease Father        Physical Exam:     Ht 6' 1 (1.854 m)   Wt 162 lb (73.5 kg)   BMI 21.37 kg/m     Physical Exam   Nursing note and vitals reviewed.  Constitutional: Oriented to person, place, and time. Appears well-developed and well-nourished. No distress.   Head: Normocephalic and atraumatic.   Eyes: Conjunctivae and EOM are normal.   Pulmonary/Chest: Effort normal. No respiratory distress.   Neurological: Alert and oriented to person, place, and  time.   Skin: Skin is warm and dry. No rash noted.   Psychiatric: Normal mood and affect. Behavior is normal  Musculoskeletal: There is notable flexion deformity in the right fifth PIP joint.  Unable to fully extend the joint or flex it beyond 90 degrees.  Tenderness to palpation over the proximal phalanx.  Sensation and vascular exams are grossly normal.      Imaging:   No results found.  MRI-HAND RIGHT WO CONTRAST    Result Date: 01/02/2022  IMPRESSION: 1. No acute osseous abnormality identified. 2. 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 Findings suggest early erosive osteoarthritis. Electronically Signed by: Tedra Coupe, MD, 01/02/2022 12:27 PM             Impression/Plan:       ICD-10-CM    1. Closed displaced fracture of proximal phalanx of right little finger, initial encounter  S62.616A AMB REFERRAL TO OCCUPATIONAL THERAPY Occupational Therapy Evaluate, Develop plan of care and implement plan treatment plan.     CANCELED: AMB REFERRAL TO OCCUPATIONAL THERAPY Occupational Therapy Evaluate, Develop plan of care and implement plan treatment plan.          I had a long discussion with the patient regarding the above diagnosis.  I reviewed his MRI in detail.  He does have some edema in the proximal phalanx.  Given his loss of range of motion I would like to get him to occupational therapy to address normalizing range of motion of the small finger.  Advance activities as tolerated and continue oral pain control as needed.  Follow-up for recheck in the office in about a month if his symptoms have not  fully resolved.        Electronically signed by: Franky Macho Day, PA-C

## 2022-02-06 NOTE — Unmapped (Signed)
Office Progress Note     Chief Complaint   Patient presents with    Follow-up     Right hand 5th finger        HPI:   Andre Olson is a 71 y.o. male who presents here today for a follow up on right 5th finger. Pt was supposed to have OT but could not do it due to communication problems. Pt still having pain in the finger.   The pain is described as unchanged.  The pain is rated as 6 but hits it on something a 10    Medications:     Current Outpatient Medications   Medication Sig Dispense Refill    albuterol (ACCUNEB) 1.25 mg/3 mL nebulizer solution Inhale 3 mLs (1.25 mg total) via nebulizer every 6 hours as needed for Wheezing. (Patient not taking: Reported on 12/31/2021) 60 vial 6    aspirin (ASPIR-81) 81 mg enteric coated tablet Take 1 tablet by mouth daily. 30 tablet 0    atorvastatin (LIPITOR) 40 mg tablet Take 1 tablet (40 mg total) by mouth daily.      azelastine (ASTELIN) 137 mcg (0.1 %) nasal spray 2 sprays into each nostril in the morning and 2 sprays before bedtime.      baclofen (LIORESAL) 10 mg tablet Take 1 tablet (10 mg total) by mouth in the morning and 1 tablet (10 mg total) at noon and 1 tablet (10 mg total) before bedtime.      baclofen (LIORESAL) 10 mg tablet Take 1 tablet (10 mg total) by mouth every 8 hours.      buPROPion (WELLBUTRIN SR) 100 mg 12 hr tablet Take 1 tablet (100 mg total) by mouth in the morning and 1 tablet (100 mg total) before bedtime.      buPROPion (WELLBUTRIN XL) 150 mg 24 hr tablet 1 tablet (150 mg total).      calcium carbonate-vitamin D (OSCAL D) 500 mg(1,250mg ) -200 unit per tablet Take 1 tablet by mouth daily.      clopidogrel (PLAVIX) 75 mg tablet Take 1 tablet by mouth daily. 30 tablet 11    cyanocobalamin (B-12) 1,000 mcg tablet Take 1 tablet (1,000 mcg total) by mouth daily.      diazePAM (VALIUM) 5 mg tablet Take 1 hour prior to MRI 1 tablet 0    fluticasone (FLONASE) 50 mcg/actuation nasal spray 1 spray into each nostril in the morning and 1 spray  before bedtime.      gabapentin (NEURONTIN) 100 mg capsule TK ONE C PO TID      HYDROcodone-acetaminophen (NORCO) 5-325 mg tablet Take 1 tablet by mouth every 8 hours as needed for Pain.      levoFLOXacin (LEVAQUIN) 500 mg tablet       levothyroxine (SYNTHROID, LEVOTHROID) 200 mcg tablet Take 1 tablet (200 mcg total) by mouth daily.      LORazepam (ATIVAN) 1 mg tablet       montelukast (SINGULAIR) 10 mg tablet Take 1 tablet (10 mg total) by mouth at bedtime. Indications: asthma prevention      morphine (MSIR) 15 mg immediate release tablet Take 1 tablet (15 mg total) by mouth every 4 hours as needed for Pain.      multivitamin (THERAGRAN) tablet Take 1 tablet by mouth daily.      omeprazole (PRILOSEC) 40 mg delayed-release capsule Take 1 capsule by mouth every 12 hours. 60 capsule 0    promethazine (PHENERGAN) 12.5 mg tablet Take 1 tablet (12.5 mg total)  by mouth every 6 hours as needed for Nausea.      pyridostigmine (MESTINON) 60 mg tablet Take 1 tablet by mouth every morning. (Patient taking differently: Take 1 tablet (60 mg total) by mouth 4 times a day. Patient takes doses at the following times: 0800, 1100, 1400, and 1700) 90 tablet 0    tiotropium-olodaterol (STIOLTO RESPIMAT) 2.5-2.5 mcg/actuation Mist Inhale into the lungs.      topiramate (TOPAMAX) 15 mg delayed release capsule Take 1 capsule (15 mg total) by mouth in the morning and 1 capsule (15 mg total) before bedtime.       No current facility-administered medications for this visit.      Allergies:     Allergies   Allergen Reactions    Dilaudid [Hydromorphone (Bulk)] Other (See Comments)     hallucinations    Gammagard Other (See Comments)     Severe HTN - crisis    Lamotrigine Hives     Pt denies any allergy to this medication        Review of Systems:  Constitutional: Negative for fever, chills, fatigue and unexpected weight change.   HENT: Negative for hearing loss, sore throat and facial swelling.    Eyes: Negative for pain and  discharge.   Respiratory: Negative for cough and shortness of breath.    Cardiovascular: Negative for chest pain.   Gastrointestinal: Negative for nausea, vomiting, diarrhea and constipation.   Musculoskeletal: noted in HPI   Skin: Negative for pallor and rash.   Neurological: Negative for seizures, syncope and numbness.   Hematological: Does not bruise/bleed easily.   Psychiatric/Behavioral: Negative for behavioral problems. The patient is not nervous/anxious.      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    Chicken pox     Chronic pain     Congenital heart defect     COPD (chronic obstructive pulmonary disease) (HCC)     Coronary artery disease     Emphysema of lung (HCC)     Fractures 2020    Right ankle    GERD (gastroesophageal reflux disease)     Headache     migraines    Hyperlipidemia     Hypertension     Hypothyroidism 1981    Thyroidectomy    Major depressive disorder     MRSA (methicillin resistant Staphylococcus aureus)     2014 nose    Mumps     Myasthenia gravis (HCC)     Myocardial infarction (HCC)     stent x1 dec 2015    Neuropathy     Orthostatic hypotension     Osteoarthritis     Osteoporosis     PE (pulmonary thromboembolism) (HCC) 2018    Peripheral neuropathy     Pneumonia     2014    PTSD (post-traumatic stress disorder)     Restless leg syndrome     Right ankle pain 05/07/2019    Open Reduction Internal Fixation Right Trimalleolar Fracture     Rubeola     Sinus congestion     Sjoegren syndrome     STD (sexually transmitted disease)     past minor and treated pt does not want to say what    Thyroid disease     Urinary tract infection        Past Surgical History:   Procedure  Laterality Date    ANKLE FRACTURE SURGERY  2020    CARDIAC SURGERY      stents dec 2013-multi-LINK vision stent-spatial gradient <3300    CORONARY ANGIOPLASTY WITH STENT PLACEMENT  2013    x 1    EKG  STRESS TEST-NUCLEAR MED  05/09/2016         ELBOW SURGERY      FOOT FRACTURE SURGERY  November 2020    HAND SURGERY  2023    INSERTION CENTRAL VENOUS ACCESS DEVICE W/ SUBCUTANEOUS PORT      left side march 2016    NEUROPLASTY / TRANSPOSITION ULNAR NERVE AT ELBOW      right arm    ORIF ANKLE FRACTURE Right 05/07/2019    Open Reduction Internal Fixation Right Trimalleolar Fracture -Dr. Delana MeyerScheidler    SHOULDER ARTHROSCOPY  2017    SHOULDER SURGERY  2017    STRESS TEST LEXISCAN  12/14/2013         THYROIDECTOMY      TURP      urodynamics study  05/24/2014       Social History     Socioeconomic History    Marital status: Significant Other     Spouse name: Not on file    Number of children: Not on file    Years of education: Not on file    Highest education level: Not on file   Occupational History    Not on file   Tobacco Use    Smoking status: Former     Packs/day: 2.00     Years: 25.00     Pack years: 50.00     Types: Cigarettes, Pipe, Cigars     Quit date: 02/06/2018     Years since quitting: 4.0     Passive exposure: Never    Smokeless tobacco: Never    Tobacco comments:     Smoked up to 2 packs a day for over 20 years.   Vaping Use    Vaping Use: Never used   Substance and Sexual Activity    Alcohol use: Yes     Alcohol/week: 21.0 standard drinks of alcohol     Types: 21 Cans of beer per week     Comment: beer every other day    Drug use: No    Sexual activity: Not Currently     Partners: Female     Birth control/protection: None   Other Topics Concern    Daily Caffeine Intake ? No    Do you exercise regularly ? No   Social History Narrative    Not on file     Social Determinants of Health     Financial Resource Strain: Not on file   Food Insecurity: Not on file   Transportation Needs: Not on file   Physical Activity: Not on file   Stress: Not on file   Social Connections: Not on file   Intimate Partner Violence: Not on file   Housing Stability: Not on file       Family History   Problem  Relation Age of Onset    Cancer Father         Lung cancer from smoking    Heart disease Father        Physical Exam:     Ht 6' 1 (1.854 m)   Wt 162 lb (73.5 kg)   BMI 21.37 kg/m    Physical Exam   Nursing note and vitals reviewed.  Constitutional: Oriented to person,  place, and time. Appears well-developed and well-nourished. No distress.   Head: Normocephalic and atraumatic.   Eyes: Conjunctivae and EOM are normal.   Pulmonary/Chest: Effort normal. No respiratory distress.   Neurological: Alert and oriented to person, place, and time.   Skin: Skin is warm and dry. No rash noted.   Psychiatric: Normal mood and affect. Behavior is normal  Musculoskeletal: No significant tenderness to palpation over the distal aspect of the right small finger.  There is tenderness over the proximal phalangeal joint.  Range of motion loss consistent with previous visit of approximately -10 degrees from full extension.  Sensation and vascular exams are otherwise normal.      Imaging:   No results found.          Impression/Plan:       ICD-10-CM    1. Closed displaced fracture of proximal phalanx of right little finger, initial encounter  S62.616A AMB REFERRAL TO OCCUPATIONAL THERAPY Occupational Therapy Evaluate, Develop plan of care and implement plan treatment plan.     CANCELED: POCT XR-FINGER/THUMB RIGHT          I had a long discussion with the patient regarding the above diagnosis.  We discussed his progress.  Has not yet started occupational therapy.  I instructed him that this is key to restoring normal motion of the joint.  Placed a new order for OT and recommended he follow-up with him as an outpatient.  We will follow-up as needed going forward.  Continue oral pain control per his pain management physician.  All questions were answered        Electronically signed by: Franky Macho Day, PA-C

## 2022-02-19 NOTE — Unmapped (Signed)
OT Evaluation    Encounter diagnosis:      ICD-10-CM    1. Closed displaced fracture of proximal phalanx of right little finger with routine healing  S62.616D       2. Finger pain, right  M79.644       3. Finger stiffness, right  M25.641         Referring provider:  Day, Franky Macho, PA-C  Primary Medical Diagnosis: R SF P1 fx 10/09/21  Plan of Care/Certification Interval:  02/19/2022 to 04/23/2022    Eval: 02/19/22  Insurance: [employer] (cohere precert)  Visit #: 1 out of 8  Next Dr. 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 Was treated non-surgically with splinting, referred to OT to work on ROM, strength, and function. Pt is LHD, RHA, is retired.   Current Pain Score:  5  Current Pain Location:  R SF PIPJ  Pain at Worst:  9/10  Pain at Best:  3/10  Numbness/Tingling:  No  Aggravating Factors:  Hitting it, bumping it  Relieving Factors:  Nothing  Patient Interventions:  Medication and External Support  Symptoms: No Change  Discharged from hospital/SNF/home health in last 30 days:  No  Diagnostic Tests: MRI, X-Ray and Imaging reviewed    MRI-HAND RIGHT WO CONTRAST     Result Date: 01/02/2022  IMPRESSION: 1. No acute osseous abnormality identified. 2. 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 Findings suggest early erosive osteoarthritis.   Work/Play Status:  Retired  Patient Goal: Flexibility without pain  Patient Reported Health Rating:  Fair  Social History:   Living Status:  Lives with spouse / significant other  Type of Home:  House  Prior Level of Functioning:   Level of Independence:  Independent  Current Level of Functioning:   Dominant Hand:  Left  Functional deficits in activities and participation: Self care, Opening items, Handling objects, Donning shoes and  Lower body dressing  Level of Independence:  Modified Independent (uses L hand or works through pain)       Objective / Mining engineer  Outcomes:   Self-Reported Functional Outcome Measure  Quick Dash:      Main Module:  55% disability          Edema:    Thumb  Index  Long  Ring  Small     Right Left Right Left Right Left Right Left Right Left     Proximal  Phalanx:           5.8 cm   5.1 cm     PIP Joint:           6.2 cm   5.2 cm     Middle  Phalanx:           5.0 cm   4.8 cm     Description:  Min  Upper Extremity ROM: (degrees)   Active (Passive)   Right  Left     Shoulder:   WNL: Yes    Elbow:   WNL: Yes    Forearm:   WNL:  Yes  Wrist:   WNL:  Yes  Hand/Digits:  Arthritic changes all digits, 1-4 grossly WNL, see below for 5  Digit Small (E/F):  MCP: 0/95 0/90       PIP: 25/70 (20/75) 5/105        DIP: 10/50 (5/55) 10/40       MMT:    Grip Strength: Elbow at 90:  Right Left      Position 2 48# 5/10 pain 65#     Pinch Strength:         Tip Pinch to SF 1# with 5/10 pain 1#       Coordination:   Coordination:  Right:  WFL   Left:  WFL  Sensation:   Upper Extremity Sensation: Right:  Intact Left:  Intact           Today's treatment:    -Initial Evaluation    -Therapeutic Exercise:   - Seated Passive Composite Digit Flexion  - 5 x daily - 5 reps - 5 hold  - Hand Place & Hold Hook Fist  - 5 x daily - 5 reps - 5 hold  - Individual Finger Extensions  - 5 x daily - 5 reps - 5 hold  - Seated Finger PIP PROM  - 5 x daily - 5 reps - 5 hold  - Isometric Finger Abduction and Adduction  - 5 x daily - 5 reps - 5 hold  - Seated Finger Adduction and Extension with Towel  - 5 x daily - 5 reps - 5 hold  - Putty Squeezes  - 5 x daily - 5 reps - 5 hold, HEP instruction      Education completed:  Eval: Initiated HEP consisting of  above exercises and occupation based exercises to facilitate ROM and function.  All exercises are to be performed 5 times a day. Pt demonstrated all exercises using teachback with  therapist to ensure proper technique was achieved. Handout issued electronically. .Access Code: [email or web address]  URL: [email or web address]       Assessment / Impairments  Pt presents with decreased ROM, strength, coordination, increased pain and edema R SF s/p P1 fx, would benefit from skilled OT/hand therapy.     Occupational Profile Assessment Clinical Decision Making   Low  [x]   Brief history relating to presenting problem  []   1-3 performance deficits relating to physical, cognitive, psychosocial  limitations/restrictions Problem Focused   [x]   Low complexity, limited amount of treatment options, no assessment modification, no comorbidities   Moderate  []   Expanded review of therapy/medical records  Additional review of physical, cognitive, psychosocial performance  [x]   3-5 performance deficits relating to physical, cognitive, psychosocial limitations/restrictions Detailed, may have comorbidities   []   Moderate analytical complexity, detailed assessments, minimal to moderate modification of assessments, may have comorbidities   High  []   Extensive review of physical, cognitive, psychosocial performance  []   5 or more performance deficits relating to physical, cognitive, and psychosocial limitations/restrictions comprehensive, one or more comorbidities  []   High analytic complexity, comprehensive assessments, multiple treatment options, significant modifications of assessment         Rehabilitation Potential: Good  Barriers to Learning: None  Impressions/Problems: Upper Extremity Weakness, Loss of AROM, Pain, Edema, Scar with Decreased Tissue Mobility, Decreased Grip Strength, Decreased Pinch Strength, Decreased Performance of Self-Care Activities, Decreased Performance of Household Activities, Decreased Performance of Leisure Activities, Loss of PROM and Decreased UE Use       Patient actively contributed to and has agreed upon the following goals for therapy:  OP THERAPY GOALS:   Patient Goal:   Flexibility without pain  Short Term Goals: To be met  by 30 days as discussed and agreed upon with the patient    -Pt will be I with HEP in order to increase ROM, strength, and endurance in order to improve performance of ADL/IADL tasks.    -Pt will report a decrease in pain with activity by 2/10ths to improve ability to engage in ADL/IADL activities.    -Pt will improve AROM of R SF by 15-20 degrees TAM to improve ability to engage in ADL/IADL activities    -Pt will demonstrate decreased edema of R SF by 0.2 cm to increase functional performance during ADL/IADL activities    -Pt will improve grip strength by 5-10# to increase functional use of R hand for daily tasks.     -Consider PIP splinting options to correct flexion contracture to increase use of hand for ADL tasks.         Long Term Goals: To be met by discharge as discussed and agreed upon with patient    LTG:  -Pt will return to near full functional use of R hand in order to be I with ADL/IADL/work related activities. As evidenced by patient report and improvement in DASH score.       Plan   (As discussed and agreed upon with the patient)    Treatment Plan:   Plan of Care:  Frequency: 1 X per week.  Duration:  8  week(s)  Treatment Plan to Include: Therapeutic Exercise CPT 97110, Manual Therapy CPT 97140, Therapeutic Activities CPT 97530, Orthotic Fabrication, Orthotic Management/Training CPT [id number] & CPT [id number], Modalities As Needed, Paraffin CPT [phone], Patient / Caregiver Education and RTM      Timed Code Treatment Time:  10 TE  Total Treatment Time:  40

## 2022-04-15 NOTE — Unmapped (Signed)
OP Therapy Discharge Report    Referring provider:  Day, Franky Macho, PA-C  Primary Medical Diagnosis: R SF P1 fx 10/09/21      Pt was seen for 1 session(s) with 1 cancel(s) and 3 no show(s).     Initial evaluation was on 02/19/22 and pt did not return after that time.     Objective data was not gathered and goals could not be assessed due to patient not returning to therapy. Please refer to last progress/treatment note for objective data and progress towards goals.    Pt is being discharged due to greater than 30 day lapse since last session / non-compliance with attendance.     Pt may be reinstated in therapy upon new orders from physician.    Thank you for the opportunity to work with this patient.

## 2022-06-07 ENCOUNTER — Inpatient Hospital Stay
Admit: 2022-06-07 | Discharge: 2022-06-13 | Disposition: A | Discharge status: Home Health | Payer: Medicare (Managed Care) | Source: Ambulatory Visit

## 2022-06-07 ENCOUNTER — Inpatient Hospital Stay: Admit: 2022-06-07 | Payer: PRIVATE HEALTH INSURANCE

## 2022-06-07 ENCOUNTER — Inpatient Hospital Stay: Payer: PRIVATE HEALTH INSURANCE

## 2022-06-07 DIAGNOSIS — I25118 Atherosclerotic heart disease of native coronary artery with other forms of angina pectoris: Secondary | ICD-10-CM

## 2022-06-07 LAB — CBC
Hematocrit: 40.6 % (ref 38.5–50.0)
Hematocrit: 39 % (ref 38.5–50.0)
Hemoglobin: 14.4 g/dL (ref 13.2–17.1)
Hemoglobin: 14.1 g/dL (ref 13.2–17.1)
MCH: 32.6 pg (ref 27.0–33.0)
MCH: 33.3 pg — ABNORMAL HIGH (ref 27.0–33.0)
MCHC: 35.5 g/dL (ref 32.0–36.0)
MCHC: 36.1 g/dL — ABNORMAL HIGH (ref 32.0–36.0)
MCV: 92 fL (ref 80.0–100.0)
MCV: 92.3 fL (ref 80.0–100.0)
MPV: 7.7 fL (ref 7.5–11.5)
MPV: 7.6 fL (ref 7.5–11.5)
Platelets: 241 10*3/uL (ref 140–400)
Platelets: 211 10E3/uL (ref 140–400)
RBC: 4.42 10*6/uL (ref 4.20–5.80)
RBC: 4.23 10E6/uL (ref 4.20–5.80)
RDW: 12.7 % (ref 11.0–15.0)
RDW: 12.3 % (ref 11.0–15.0)
WBC: 5.3 10*3/uL (ref 3.8–10.8)
WBC: 6.5 10E3/uL (ref 3.8–10.8)

## 2022-06-07 LAB — BASIC METABOLIC PANEL
Anion Gap: 7 mmol/L (ref 3–16)
BUN: 18 mg/dL (ref 7–25)
CO2: 26 mmol/L (ref 21–33)
Calcium: 8.5 mg/dL — ABNORMAL LOW (ref 8.6–10.3)
Chloride: 108 mmol/L (ref 98–110)
Creatinine: 1.08 mg/dL (ref 0.60–1.30)
EGFR: 72
Glucose: 83 mg/dL (ref 70–100)
Osmolality, Calculated: 293 mosm/kg (ref 278–305)
Potassium: 3.9 mmol/L (ref 3.5–5.3)
Sodium: 141 mmol/L (ref 133–146)

## 2022-06-07 LAB — PHOSPHORUS: Phosphorus: 2.8 mg/dL (ref 2.1–4.5)

## 2022-06-07 LAB — POC OXYHEMOGLOBIN
POC Oxyhemoglobin: 73.8 % (ref 95–98)
POC Oxyhemoglobin: 75.8 % — ABNORMAL LOW (ref 95–98)
POC Oxyhemoglobin: 95.1 % (ref 95–98)

## 2022-06-07 LAB — PROTIME-INR
INR: 1 (ref 0.9–1.1)
Protime: 13.2 s (ref 12.1–15.1)

## 2022-06-07 LAB — T4, FREE: Free T4: 0.5 ng/dL (ref 0.61–1.76)

## 2022-06-07 LAB — POC TOTAL HEMOGLOBIN
POC Total Hemoglobin: 13.7 g/dL (ref 14.0–18.0)
POC Total Hemoglobin: 13.7 g/dL — ABNORMAL LOW (ref 14.0–18.0)
POC Total Hemoglobin: 14.1 g/dL (ref 14.0–18.0)

## 2022-06-07 LAB — MAGNESIUM: Magnesium: 1.8 mg/dL (ref 1.5–2.5)

## 2022-06-07 LAB — B NATRIURETIC PEPTIDE: BNP: 33 pg/mL (ref 0–100)

## 2022-06-07 LAB — HIGH SENSITIVITY TROPONIN: High Sensitivity Troponin: 3 ng/L (ref 0–20)

## 2022-06-07 LAB — TSH: TSH: 62.83 u[IU]/mL (ref 0.45–4.12)

## 2022-06-07 MED ORDER — olodateroL (STRIVERDI) 2.5 mcg/actuation inhaler 2 puff
2.5 | Freq: Every day | RESPIRATORY_TRACT
Start: 2022-06-07 — End: 2022-06-14
  Administered 2022-06-09 – 2022-06-13 (×4): 2 via RESPIRATORY_TRACT

## 2022-06-07 MED ORDER — phenylephrine 100 mcg/mL in NS 0.9% 10 mL IV 100 mcg/ml syringe
100 | INTRAVENOUS | Status: AC
Start: 2022-06-07 — End: ?

## 2022-06-07 MED ORDER — oxyCODONE-acetaminophen (PERCOCET) 7.5-325 mg per tablet 1 tablet
7.5-325 | Freq: Once | ORAL
Start: 2022-06-07 — End: 2022-06-09

## 2022-06-07 MED ORDER — peg 400-propylene glycol (SYSTANE) 0.4-0.3 % ophth solution Drop 1 drop
0.4-0.3 | Freq: Three times a day (TID) | OPHTHALMIC | PRN
Start: 2022-06-07 — End: 2022-06-07

## 2022-06-07 MED ORDER — aspirin EC tablet 81 mg
81 | Freq: Every day | ORAL | Status: AC
Start: 2022-06-07 — End: 2022-06-14
  Administered 2022-06-08 – 2022-06-13 (×6): 81 mg via ORAL

## 2022-06-07 MED ORDER — fentaNYL (SUBLIMAZE) injection 25 mcg
50 | Freq: Once | INTRAMUSCULAR | Status: AC
Start: 2022-06-07 — End: 2022-06-07
  Administered 2022-06-07: 15:00:00 25 ug via INTRAVENOUS

## 2022-06-07 MED ORDER — PARoxetine (PAXIL) tablet 40 mg
40 | Freq: Every day | ORAL | Status: AC
Start: 2022-06-07 — End: 2022-06-14
  Administered 2022-06-07 – 2022-06-13 (×7): 40 mg via ORAL

## 2022-06-07 MED ORDER — montelukast (SINGULAIR) tablet 10 mg
10 | Freq: Every evening | ORAL
Start: 2022-06-07 — End: 2022-06-14
  Administered 2022-06-08 – 2022-06-13 (×6): 10 mg via ORAL

## 2022-06-07 MED ORDER — fentaNYL (SUBLIMAZE) injection
50 | INTRAMUSCULAR | PRN
Start: 2022-06-07 — End: 2022-06-07
  Administered 2022-06-07 (×2): 25 via INTRAVENOUS

## 2022-06-07 MED ORDER — heparin (porcine) injection 5,000 Units
5000 | Freq: Three times a day (TID) | INTRAMUSCULAR | Status: AC
Start: 2022-06-07 — End: 2022-06-14
  Administered 2022-06-08 – 2022-06-11 (×2): 5000 [IU] via SUBCUTANEOUS

## 2022-06-07 MED ORDER — atorvastatin (LIPITOR) tablet 40 mg
40 | Freq: Every evening | ORAL | Status: AC
Start: 2022-06-07 — End: 2022-06-14
  Administered 2022-06-08 – 2022-06-13 (×6): 40 mg via ORAL

## 2022-06-07 MED ORDER — fentaNYL (SUBLIMAZE) 50 mcg/mL injection
50 | INTRAMUSCULAR | Status: AC
Start: 2022-06-07 — End: ?

## 2022-06-07 MED ORDER — ondansetron (ZOFRAN) tablet 4 mg
4 | Freq: Four times a day (QID) | ORAL | PRN
Start: 2022-06-07 — End: 2022-06-14

## 2022-06-07 MED ORDER — pantoprazole (PROTONIX) EC tablet 40 mg
40 | Freq: Every day | ORAL | Status: AC
Start: 2022-06-07 — End: 2022-06-14
  Administered 2022-06-07 – 2022-06-13 (×7): 40 mg via ORAL

## 2022-06-07 MED ORDER — OMNIPAQUE (iohexol) 350 mg iodine/mL
350 | INTRAVENOUS | PRN
Start: 2022-06-07 — End: 2022-06-07
  Administered 2022-06-07: 16:00:00 40

## 2022-06-07 MED ORDER — fluticasone propionate (FLONASE) 50 mcg/actuation nasal spray 1 spray
50 | Freq: Two times a day (BID) | NASAL | Status: AC
Start: 2022-06-07 — End: 2022-06-14
  Administered 2022-06-08 – 2022-06-13 (×7): 1 via NASAL

## 2022-06-07 MED ORDER — midazolam (PF) (VERSED) 1 mg/mL injection
1 | INTRAMUSCULAR | Status: AC
Start: 2022-06-07 — End: ?

## 2022-06-07 MED ORDER — QUEtiapine (SEROQUEL) half tablet 12.5 mg
12.5 | Freq: Every evening | ORAL | Status: AC
Start: 2022-06-07 — End: 2022-06-14
  Administered 2022-06-09 – 2022-06-13 (×5): 12.5 mg via ORAL

## 2022-06-07 MED ORDER — umeclidinium (INCRUSE ELLIPTA) 62.5 mcg/actuation powder for inhalation DsDv 62.5 mcg
62.5 | Freq: Every day | RESPIRATORY_TRACT | Status: AC
Start: 2022-06-07 — End: 2022-06-14
  Administered 2022-06-09 – 2022-06-10 (×2): 62.5 ug via RESPIRATORY_TRACT

## 2022-06-07 MED ORDER — midazolam (PF) (VERSED) injection
1 | INTRAMUSCULAR | PRN
Start: 2022-06-07 — End: 2022-06-07
  Administered 2022-06-07: 16:00:00 .5 via INTRAVENOUS

## 2022-06-07 MED ORDER — tamsulosin (FLOMAX) capsule 0.4 mg
0.4 | Freq: Every evening | ORAL
Start: 2022-06-07 — End: 2022-06-14
  Administered 2022-06-08 – 2022-06-13 (×6): 0.4 mg via ORAL

## 2022-06-07 MED ORDER — lidocaine (LIDODERM) 5 % 2 patch
5 | Freq: Every day | TOPICAL | Status: AC
Start: 2022-06-07 — End: 2022-06-14
  Administered 2022-06-08: 17:00:00 1 via TRANSDERMAL
  Administered 2022-06-11 – 2022-06-13 (×3): 2 via TRANSDERMAL

## 2022-06-07 MED ORDER — midazolam (PF) (VERSED) injection 1 mg
1 | Freq: Once | INTRAMUSCULAR | Status: AC
Start: 2022-06-07 — End: 2022-06-07
  Administered 2022-06-07: 15:00:00 1 mg via INTRAVENOUS

## 2022-06-07 MED ORDER — verapamiL (ISOPTIN) injection
2.5 | INTRAVENOUS | PRN
Start: 2022-06-07 — End: 2022-06-07
  Administered 2022-06-07: 16:00:00 2.5 via INTRA_ARTERIAL

## 2022-06-07 MED ORDER — topiramate (TOPAMAX) tablet 50 mg
50 | Freq: Two times a day (BID) | ORAL | Status: AC
Start: 2022-06-07 — End: 2022-06-14
  Administered 2022-06-08 – 2022-06-13 (×12): 50 mg via ORAL

## 2022-06-07 MED ORDER — mycophenolate (CELLCEPT) 500 mg tablet 1,000 mg
500 | Freq: Two times a day (BID) | ORAL | Status: AC
Start: 2022-06-07 — End: 2022-06-14
  Administered 2022-06-08 – 2022-06-13 (×11): 1000 mg via ORAL

## 2022-06-07 MED ORDER — morphine SR (MS CONTIN) 12 hr tablet 15 mg
15 | Freq: Every evening | ORAL | Status: AC
Start: 2022-06-07 — End: 2022-06-14
  Administered 2022-06-08 – 2022-06-13 (×6): 15 mg via ORAL

## 2022-06-07 MED ORDER — cyanocobalamin (VITAMIN B-12) tablet 1,000 mcg
1000 | Freq: Every day | ORAL | Status: AC
Start: 2022-06-07 — End: 2022-06-14
  Administered 2022-06-07 – 2022-06-13 (×7): 1000 ug via ORAL

## 2022-06-07 MED ORDER — buPROPion HCL (WELLBUTRIN XL) 24 hr tablet 300 mg
300 | Freq: Every day | ORAL | Status: AC
Start: 2022-06-07 — End: 2022-06-14
  Administered 2022-06-07 – 2022-06-13 (×7): 300 mg via ORAL

## 2022-06-07 MED ORDER — albuterol (PROVENTIL) 90 mcg/actuation inhaler 2 puff
90 | RESPIRATORY_TRACT | Status: AC | PRN
Start: 2022-06-07 — End: 2022-06-14

## 2022-06-07 MED ORDER — multivitamin with folic acid tablet 1 tablet
400 | Freq: Every day | ORAL | Status: AC
Start: 2022-06-07 — End: 2022-06-14
  Administered 2022-06-07 – 2022-06-13 (×7): 1 via ORAL

## 2022-06-07 MED ORDER — azaTHIOprine (IMURAN) tablet 200 mg
50 | Freq: Every evening | ORAL
Start: 2022-06-07 — End: 2022-06-07

## 2022-06-07 MED ORDER — acetaminophen (TYLENOL) tablet 650 mg
325 | ORAL | Status: AC | PRN
Start: 2022-06-07 — End: 2022-06-14

## 2022-06-07 MED ORDER — calcium carbonate (OS-CAL) tablet 1250 mg (500 mg elemental calcium)
500 | Freq: Two times a day (BID) | ORAL | Status: AC
Start: 2022-06-07 — End: 2022-06-14
  Administered 2022-06-08 – 2022-06-13 (×12): 1250 mg via ORAL

## 2022-06-07 MED ORDER — loperamide (IMODIUM) capsule 2 mg
2 | Freq: Four times a day (QID) | ORAL | Status: AC | PRN
Start: 2022-06-07 — End: 2022-06-14

## 2022-06-07 MED ORDER — cholecalciferol (vitamin D3) tablet 1,000 Units
1000 | Freq: Every day | ORAL | Status: AC
Start: 2022-06-07 — End: 2022-06-14
  Administered 2022-06-08 – 2022-06-13 (×6): 1000 [IU] via ORAL

## 2022-06-07 MED ORDER — baclofen (LIORESAL) tablet 10 mg
5 | Freq: Two times a day (BID) | ORAL
Start: 2022-06-07 — End: 2022-06-14
  Administered 2022-06-08 – 2022-06-13 (×12): 10 mg via ORAL

## 2022-06-07 MED ORDER — peg 400-propylene glycol (SYSTANE) 0.4-0.3 % ophth solution Drop 1 drop
0.4-0.3 | Freq: Every day | OPHTHALMIC
Start: 2022-06-07 — End: 2022-06-07

## 2022-06-07 MED ORDER — HYDROcodone-acetaminophen (NORCO) 7.5-325 mg per tablet 1 tablet
7.5-325 | Freq: Three times a day (TID) | ORAL | Status: AC
Start: 2022-06-07 — End: 2022-06-14
  Administered 2022-06-08 – 2022-06-13 (×13): 1 via ORAL

## 2022-06-07 MED ORDER — sodium chloride 0.9 % infusion
INTRAVENOUS | Status: AC
Start: 2022-06-07 — End: 2022-06-08

## 2022-06-07 MED ORDER — oxyCODONE-acetaminophen (PERCOCET) 5-325 mg per tablet 1 tablet
5-325 | Freq: Once | ORAL | Status: AC
Start: 2022-06-07 — End: 2022-06-07
  Administered 2022-06-07: 20:00:00 1 via ORAL

## 2022-06-07 MED ORDER — heparin (porcine) injection
5000 | INTRAMUSCULAR | PRN
Start: 2022-06-07 — End: 2022-06-07
  Administered 2022-06-07: 16:00:00 4000 via INTRAVENOUS

## 2022-06-07 MED ORDER — aspirin chewable tablet 243 mg
81 | Freq: Once | ORAL | Status: AC
Start: 2022-06-07 — End: 2022-06-07

## 2022-06-07 MED ORDER — levothyroxine (SYNTHROID) tablet 175 mcg
100 | Freq: Every day | ORAL
Start: 2022-06-07 — End: 2022-06-09
  Administered 2022-06-07 – 2022-06-09 (×3): 175 ug via ORAL

## 2022-06-07 MED ORDER — propylene glycoL 0.6 % Drop 1 drop
0.6 | Freq: Three times a day (TID) | OPHTHALMIC | PRN
Start: 2022-06-07 — End: 2022-06-10
  Administered 2022-06-09 – 2022-06-10 (×2): 1 [drp] via OPHTHALMIC

## 2022-06-07 MED ORDER — nitroGLYCERIN in D5W injection - CATH LAB ONLY
200 | INTRACORONARY | PRN
Start: 2022-06-07 — End: 2022-06-07
  Administered 2022-06-07: 16:00:00 200 via INTRA_ARTERIAL

## 2022-06-07 MED ORDER — aspirin tablet 325 mg
325 | Freq: Once | ORAL | Status: AC
Start: 2022-06-07 — End: 2022-06-07
  Administered 2022-06-07: 14:00:00 325 mg via ORAL

## 2022-06-07 MED ORDER — pyridostigmine (MESTINON) tablet 60 mg
60 | Freq: Three times a day (TID) | ORAL | Status: AC
Start: 2022-06-07 — End: 2022-06-13
  Administered 2022-06-09 – 2022-06-13 (×8): 60 mg via ORAL

## 2022-06-07 MED ORDER — QUEtiapine (SEROQUEL) half tablet 25 mg
12.5 | Freq: Every evening | ORAL
Start: 2022-06-07 — End: 2022-06-07
  Administered 2022-06-08: 03:00:00 12.5 mg via ORAL

## 2022-06-07 MED FILL — OXYCODONE-ACETAMINOPHEN 7.5 MG-325 MG TABLET: 7.5-325 7.5-325 mg | ORAL | Qty: 1

## 2022-06-07 MED FILL — THERA 400 MCG TABLET: 400 400 mcg | ORAL | Qty: 1

## 2022-06-07 MED FILL — SYSTANE (PROPYLENE GLYCOL) 0.4 %-0.3 % EYE DROPS: 0.4-0.3 0.4-0.3 % | OPHTHALMIC | Qty: 5

## 2022-06-07 MED FILL — CELLCEPT 500 MG TABLET: 500 500 mg | ORAL | Qty: 2

## 2022-06-07 MED FILL — ASPIRIN 325 MG TABLET: 325 325 MG | ORAL | Qty: 1

## 2022-06-07 MED FILL — AZATHIOPRINE 50 MG TABLET: 50 50 mg | ORAL | Qty: 4

## 2022-06-07 MED FILL — TAMSULOSIN 0.4 MG CAPSULE: 0.4 0.4 mg | ORAL | Qty: 1

## 2022-06-07 MED FILL — STRIVERDI RESPIMAT 2.5 MCG/ACTUATION SOLUTION FOR INHALATION: 2.5 2.5 mcg/actuation | RESPIRATORY_TRACT | Qty: 4

## 2022-06-07 MED FILL — SYNTHROID 175 MCG TABLET: 175 175 mcg | ORAL | Qty: 1

## 2022-06-07 MED FILL — ONDANSETRON HCL 4 MG TABLET: 4 4 MG | ORAL | Qty: 1

## 2022-06-07 MED FILL — PAROXETINE 40 MG TABLET: 40 40 MG | ORAL | Qty: 1

## 2022-06-07 MED FILL — BUPROPION HCL XL 300 MG 24 HR TABLET, EXTENDED RELEASE: 300 300 MG | ORAL | Qty: 1

## 2022-06-07 MED FILL — OYSTER SHELL CALCIUM 500  500 MG CALCIUM (1,250 MG) TABLET: 500 500 mg calcium (1,250 mg) | ORAL | Qty: 1

## 2022-06-07 MED FILL — SYSTANE COMPLETE 0.6 % EYE DROPS: 0.6 0.6 % | OPHTHALMIC | Qty: 1

## 2022-06-07 MED FILL — MONTELUKAST 10 MG TABLET: 10 10 mg | ORAL | Qty: 1

## 2022-06-07 MED FILL — SEROQUEL 25 MG TABLET: 25 25 mg | ORAL | Qty: 1

## 2022-06-07 MED FILL — PYRIDOSTIGMINE BROMIDE 60 MG TABLET: 60 60 mg | ORAL | Qty: 1

## 2022-06-07 MED FILL — FENTANYL (PF) 50 MCG/ML INJECTION SOLUTION: 50 50 mcg/mL | INTRAMUSCULAR | Qty: 2

## 2022-06-07 MED FILL — TOPIRAMATE 50 MG TABLET: 50 50 MG | ORAL | Qty: 1

## 2022-06-07 MED FILL — INCRUSE ELLIPTA 62.5 MCG/ACTUATION POWDER FOR INHALATION: 62.5 62.5 mcg/actuation | RESPIRATORY_TRACT | Qty: 7

## 2022-06-07 MED FILL — ATORVASTATIN 40 MG TABLET: 40 40 MG | ORAL | Qty: 1

## 2022-06-07 MED FILL — PANTOPRAZOLE 40 MG TABLET,DELAYED RELEASE: 40 40 MG | ORAL | Qty: 1

## 2022-06-07 MED FILL — MIDAZOLAM (PF) 1 MG/ML INJECTION SOLUTION: 1 1 mg/mL | INTRAMUSCULAR | Qty: 2

## 2022-06-07 MED FILL — VENTOLIN HFA 90 MCG/ACTUATION AEROSOL INHALER: 90 90 mcg/actuation | RESPIRATORY_TRACT | Qty: 8

## 2022-06-07 MED FILL — VITAMIN B-12  1,000 MCG TABLET: 1000 1000 MCG | ORAL | Qty: 1

## 2022-06-07 MED FILL — OXYCODONE-ACETAMINOPHEN 5 MG-325 MG TABLET: 5-325 5-325 mg | ORAL | Qty: 1

## 2022-06-07 MED FILL — BACLOFEN 10 MG TABLET: 10 10 MG | ORAL | Qty: 1

## 2022-06-07 MED FILL — FLUTICASONE PROPIONATE 50 MCG/ACTUATION NASAL SPRAY,SUSPENSION: 50 50 mcg/actuation | NASAL | Qty: 16

## 2022-06-07 MED FILL — PHENYLEPHRINE 100 MCG/ML IN NSS 0.9% 10 ML IV SYRINGE: 100 100 mcg/ml | INTRAVENOUS | Qty: 10

## 2022-06-07 MED FILL — LIDODERM 5 % TOPICAL PATCH: 5 5 % | TOPICAL | Qty: 2

## 2022-06-07 NOTE — Unmapped (Signed)
Report called to RN on 6NW. Pt placed on tele box and [facility] called. Patient transported to [phone].

## 2022-06-07 NOTE — Unmapped (Signed)
Cardiac Surgery CONSULT  Patient: Andre Olson MRN: [id number] ?   Date of Birth: [birth year] Age: 72 y.o. Sex: male   Unit: [facility] Room/Bed: CVR14/UCVR-14 [facility]   Admitting Physician: Collier Flowers  Date of Admission: 06/07/2022  Admission type: Elective  Elective: Collier Flowers  Primary Care Physician: Marolyn Haller, MD  Date: 06/07/2022     Consulting Physician: Dr. Richardine Service    Chief Complaint: Multivessel coronary artery disease    HPI:  Andre Olson is a 72 y.o. male with a PMH of myasthenia gravis, CAD s/p thrombectomy and DES to the RCA in 2013, autonomic neuropathy, DVT, PE s/p EKOS, thryoid papillary cancer who presented to the [facility] for an outpatient visit complaining of chest pain and SOB.     TTE revealed inferior wall hypokinesis. He was transferred to [facility] for further evaluation. LHC today demonstrating 90% proximal LAD, 90% Diag, 80% RCA stenosis.     He  has a past medical history of Benign prostate hyperplasia, Cancer (CMS-HCC), COPD (chronic obstructive pulmonary disease) (CMS-HCC), Coronary artery disease, Depression, GERD (gastroesophageal reflux disease), Hypertension, Hypothyroid, Myasthenia gravis (CMS-HCC), and Orthostatic hypotension.    He  has a past surgical history that includes Total thyroidectomy (March 1995) and ulnar nerve release.      Prior to Admission medications    Medication Sig Start Date End Date Taking? Authorizing Provider   atorvastatin (LIPITOR) 40 MG tablet Take by mouth.   Yes Historical Provider, MD   azelastine (ASTELIN) 137 mcg (0.1 %) nasal spray Use 2 sprays into each nostril 2 times a day. Use in each nostril as directed   Yes Historical Provider, MD   baclofen (LIORESAL) 10 MG tablet Take by mouth 2 times a day.   Yes Historical Provider, MD   budesonide-formoterol (SYMBICORT) 80-4.5 mcg/actuation inhaler Inhale 2 puffs into the lungs 2 times a day.   Yes Historical Provider, MD   cyanocobalamin (VITAMIN B-12) 1000 MCG tablet Take 1 tablet  (1,000 mcg total) by mouth daily.   Yes Historical Provider, MD   finasteride (PROSCAR) 5 mg tablet Take 1 tablet (5 mg total) by mouth daily.   Yes Historical Provider, MD   HYDROcodone-acetaminophen (NORCO) 7.5-325 mg per tablet Take 1 tablet by mouth 3 times a day.   Yes Historical Provider, MD   ipratropium (ATROVENT) 0.03 % nasal spray Use 2 sprays into each nostril every 12 (twelve) hours.   Yes Historical Provider, MD   levothyroxine (SYNTHROID, LEVOTHROID) 112 MCG tablet Take 2 tablets (224 mcg total) by mouth daily.   Yes Historical Provider, MD   montelukast (SINGULAIR) 10 mg tablet Take 1 tablet (10 mg total) by mouth at bedtime.   Yes Historical Provider, MD   multivitamin capsule Take 1 capsule by mouth daily.   Yes Historical Provider, MD   omeprazole (PRILOSEC) 40 MG capsule Take by mouth. 10/21/16  Yes Historical Provider, MD   ondansetron (ZOFRAN) 4 MG tablet Take 1 tablet (4 mg total) by mouth every 6 hours as needed for Nausea. 12/30/13  Yes Barrie Folk Ditty, MD   pyridostigmine (MESTINON) 60 mg tablet Take 1 tablet (60 mg total) by mouth 3 times a day. Not taking often per pt report due to side effects 10/22/16  Yes Historical Provider, MD   tamsulosin (FLOMAX) 0.4 mg Cp24 Take 1 capsule (0.4 mg total) by mouth at bedtime.   Yes Historical Provider, MD   azaTHIOprine (IMURAN) 100 mg tablet Take 2 tablets (200  mg total) by mouth At bedtime. 12/30/13   Barrie Folk Ditty, MD   buPROPion HCL (WELLBUTRIN XL) 300 MG 24 hr tablet Take 1 tablet (300 mg total) by mouth daily.    Historical Provider, MD   buPROPion SR (WELLBUTRIN SR) 150 MG tablet Take 1 tablet (150 mg total) by mouth 2 times a day.    Historical Provider, MD   clopidogrel (PLAVIX) 75 mg tablet Take by mouth. 06/16/15   Historical Provider, MD   divalproex (DEPAKOTE) 125 MG DELAYED RELEASE tablet Take by mouth.    Historical Provider, MD   HYDROcodone-acetaminophen (NORCO) 5-325 mg per tablet TK 1 T PO  BID PRN P 12/11/16   Historical Provider, MD    ipratropium-albuterol (DUO-NEB) 0.5 mg-3 mg(2.5 mg base)/3 mL nebulizer solution Inhale into the lungs. 10/21/16   Historical Provider, MD   midodrine (PROAMATINE) 10 MG tablet Take 1 tablet (10 mg total) by mouth 3 times a day.    Historical Provider, MD   ondansetron (ZOFRAN) 4 mg/2 mL Soln injection Inject 2 mLs (4 mg total) into the vein every 6 hours as needed. 12/30/13   Barrie Folk Ditty, MD   pantoprazole (PROTONIX) 40 MG tablet Take 1 tablet (40 mg total) by mouth every morning before breakfast.    Historical Provider, MD   pravastatin (PRAVACHOL) 40 MG tablet Take 1 tablet (40 mg total) by mouth at bedtime.    Historical Provider, MD   pyridostigmine (MESTINON) 60 mg tablet Take 1 tablet (60 mg total) by mouth 3 times a day.    Historical Provider, MD   rivaroxaban (XARELTO) 20 mg Tab Take by mouth. 11/09/16   Historical Provider, MD   UNABLE TO FIND inhaler    Historical Provider, MD   venlafaxine (EFFEXOR) 75 MG tablet Take 1 tablet (75 mg total) by mouth 3 times a day.    Historical Provider, MD         Allergies   Allergen Reactions    Dilaudid  [Hydromorphone (Bulk)]      hallucinations    Lamotrigine Hives       No family history on file.    Social:   Social History     Tobacco Use    Smoking status: Former     Packs/day: 1.00     Years: 36.00     Additional pack years: 0.00     Total pack years: 36.00     Types: Cigarettes     Quit date: 02/02/2000     Years since quitting: 22.3    Smokeless tobacco: Never   Substance Use Topics    Alcohol use: Yes     Alcohol/week: 12.0 - 14.0 standard drinks of alcohol     Types: 12 - 14 Cans of beer per week      Lives at home with significant other  currently retired  Able to perform daily ADL's.     Review of Systems - History obtained from chart review and the patient  Review of Systems   Constitutional: Negative.    HENT: Negative.     Eyes: Negative.    Respiratory:  Positive for shortness of breath.    Cardiovascular: Negative.    Gastrointestinal: Negative.     Genitourinary: Negative.    Musculoskeletal: Negative.    Skin: Negative.    Neurological: Negative.    Endo/Heme/Allergies: Negative.    Psychiatric/Behavioral: Negative.         Vitals:    06/07/22 1500  BP: 122/65   Pulse: 60   Resp: 17   Temp:    SpO2: 99%     Body mass index is 17.51 kg/m.    Physical Exam  Constitutional:       Appearance: Normal appearance.   HENT:      Head: Normocephalic and atraumatic.   Cardiovascular:      Rate and Rhythm: Normal rate and regular rhythm.      Heart sounds: Normal heart sounds.   Pulmonary:      Effort: Pulmonary effort is normal.      Breath sounds: Normal breath sounds.   Abdominal:      Palpations: Abdomen is soft.   Skin:     General: Skin is warm.      Capillary Refill: Capillary refill takes less than 2 seconds.   Neurological:      General: No focal deficit present.      Mental Status: He is alert and oriented to person, place, and time.   Psychiatric:         Mood and Affect: Mood normal.         Behavior: Behavior normal.          Diagnostic Studies:  LHC 06/07/22:  Low-normal filling pressures, preserved cardiac output  LM: patent  LAD: proximal (90%) and mid-vessel (80%) stenosis; ostial 90% diagonal 1 disease  LCX: patent  RCA: Mid-vessel 80% stenosis     LVEDP 8     Right radial artery access (TR band in place 13 mmHg), Right IJ vein access (Manual hold)     RECOMMENDATIONS:  Admit to CVICU A team (accepted by Dr. Oval Linsey) for heart-team approach to revascularization. CT surgery consult needed.    TTE 06/26/21:   Normal biventricular systeolic function. Normal LV diastolic function. PASP 21 by TR assuming RA pressure is 3. No MR, AS, or AI. RV and RA are severely dilated.     CXR 06/07/22:  IMPRESSION:   Negative expiratory portable chest.     Labs:    Lab Results   Component Value Date    WBC 6.5 06/07/2022    HGB 14.1 06/07/2022    HCT 39.0 06/07/2022    MCV 92.3 06/07/2022    PLT 211 06/07/2022     Lab Results   Component Value Date    CREATININE 1.08  06/07/2022    BUN 18 06/07/2022    NA 141 06/07/2022    K 3.9 06/07/2022    CL 108 06/07/2022    CO2 26 06/07/2022       Cardiac Markers: No results found for: CRP, CKMB, ALT, AST  COAGS:   Lab Results   Component Value Date    INR 1.0 06/07/2022    INR 1.0 12/29/2013     Lipid Profile: No results found for: TRIG, HDL, LDL  Liver Panel: No results found for: GLOB, ALT    Personally Reviewed:patient chart and all available records pertaining to this admission. History obtained from patient.     Assessment:   Multivessel coronary artery disease  Myasthenia Gravis  COPD    Plan and Discussion:   Mr. Carmickle is a 72 year old gentleman with a PMH of myasthenia gravis, CAD s/p thrombectomy and DES to the RCA in 2013, autonomic neuropathy, DVT, PE s/p EKOS, thryoid papillary cancer, COPD found to have multivessel coronary artery disease. Given his myasthenia gravis and COPD, PCI or Hybrid approach with MICS-CABG + PCI would be recommended over traditional sternotomy  approach. These options will be discussed with Dr. Rica Mote. Should surgical revascularization be considered, preoperative recommendations would include the following:    -A Neurology consult to assist with Myasthenia Gravis management perioperatively  -Vein Mapping  -Carotid Duplex  -PFTs  -TTE  -CT Coronary to evaluate ability for MICS-CABG    Signed By: Jiles Garter, CNP   ? June 07, 2022

## 2022-06-07 NOTE — Unmapped (Signed)
[facility]  CVICU History and Physical    Room: CVR14/UCVR-14, Chief Complaint(s): chest pain.     HPI     Andre Olson is a 72 y.o. 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    Pt has been having worsening dyspnea the past month, with concurrent chest pain. Hence, pt arrived at [facility] today for his scheduled LHC/RHC. Dyspnea at rest, is not related with exertion. Chest pain occurs at rest, of pressure-like quality that radiates to left axilla. Chest pain intermittent, 3/10. Pt reports chest pain similar to that when he had an MI about a decade ago. Pt reports prior tobacco smoker ~50 pack-year hx, stopped smoking about 5 years ago. Drinks 3 beers/day for past 50 years.    Upon arrival at [facility] on 06/07/2022, pt was HDS/AF. BMP wnl, CBC wnl. ECG showed no e/o ischemia. ASA 325mg  load given.pt went to cath lab. LHC showed proximal (90%) and mid-LAD (80%) stenosis; ostial 90% diagonal 1 disease. Mid-RCA 80% stenosis. RHC was wnl, w/ low-normal filling pressures, preserved cardiac output. Pt admitted floor level of care w/ CVICU as primary team for further CT surgery evaluation.      Cardiac History     TTE (06/26/2021):   Normal biventricular systeolic function. Normal LV diastolic function. PASP 21 by TR assuming RA pressure is 3. No MR, AS, or AI. RV and RA are severely dilated.      TEE:   No results found for this or any previous visit.      LHC:   RHC:   No results found for this or any previous visit.      Stress Test :  Bicycle stress (03/2017): LVEF 40-45%, hypertensive response 210/100 with exercise, no changes in PASP or diastology with exercise.    NM MPI SPECT   SPECT  [facility] (05/2016): normal perfusion.       Cardiac MRI :   No results found for this or any previous visit.       ROS     Full 10-point review of systems performed and negative other than stated above.    Past Medical History     Past Medical History:   Diagnosis Date    Benign prostate hyperplasia     Cancer (CMS-HCC)     COPD (chronic obstructive pulmonary disease) (CMS-HCC)     Coronary artery disease     Depression     GERD (gastroesophageal reflux disease)     Hypertension     Hypothyroid     Myasthenia gravis (CMS-HCC)     Orthostatic hypotension        Past Surgical History     Past Surgical History:   Procedure Laterality Date    TOTAL THYROIDECTOMY  March 1995    ulnar nerve release         Family and Social History     - father (deceased) had hx of heart disease, maternal uncle (45s) hx of stroke    Social History     Socioeconomic History    Marital status: Single     Spouse name: Not on file    Number of children: Not on file    Years of education: Not  on file    Highest education level: Not on file   Occupational History    Not on file   Tobacco Use    Smoking status: Former     Packs/day: 1.00     Years: 36.00     Additional pack years: 0.00     Total pack years: 36.00     Types: Cigarettes     Quit date: 02/02/2000     Years since quitting: 22.3    Smokeless tobacco: Never   Substance and Sexual Activity    Alcohol use: Yes     Alcohol/week: 12.0 - 14.0 standard drinks of alcohol     Types: 12 - 14 Cans of beer per week    Drug use: No    Sexual activity: Yes     Partners: Female   Other Topics Concern    Not on file   Social History Narrative    Not on file     Social Determinants of Health     Financial Resource Strain: Not on file   Food Insecurity: Not on file   Transportation Needs: Not on file   Physical Activity: Not on file   Stress: Not on file   Social Connections: Not on file   Intimate Partner Violence: Not on file   Housing Stability: Not on file     Supplemental SHx:  - Not currently  smoking (50 pack-year hx, last smoked in 2019)  - Drinks 3 beers/night past ~50 years  - Denies illicit drug use.   - Lives with wife.     Medications     Allergies:   Allergies   Allergen Reactions    Dilaudid  [Hydromorphone (Bulk)]      hallucinations    Lamotrigine Hives     Home Meds:  Prior to Admission medications    Medication Sig Start Date End Date Taking? Authorizing Provider   atorvastatin (LIPITOR) 40 MG tablet Take by mouth.   Yes Historical Provider, MD   azelastine (ASTELIN) 137 mcg (0.1 %) nasal spray Use 2 sprays into each nostril 2 times a day. Use in each nostril as directed   Yes Historical Provider, MD   baclofen (LIORESAL) 10 MG tablet Take by mouth 2 times a day.   Yes Historical Provider, MD   budesonide-formoterol (SYMBICORT) 80-4.5 mcg/actuation inhaler Inhale 2 puffs into the lungs 2 times a day.   Yes Historical Provider, MD   cyanocobalamin (VITAMIN B-12) 1000 MCG tablet Take 1 tablet (1,000 mcg total) by mouth daily.   Yes Historical Provider, MD   finasteride (PROSCAR) 5 mg tablet Take 1 tablet (5 mg total) by mouth daily.   Yes Historical Provider, MD   HYDROcodone-acetaminophen (NORCO) 7.5-325 mg per tablet Take 1 tablet by mouth 3 times a day.   Yes Historical Provider, MD   ipratropium (ATROVENT) 0.03 % nasal spray Use 2 sprays into each nostril every 12 (twelve) hours.   Yes Historical Provider, MD   levothyroxine (SYNTHROID, LEVOTHROID) 112 MCG tablet Take 2 tablets (224 mcg total) by mouth daily.   Yes Historical Provider, MD   montelukast (SINGULAIR) 10 mg tablet Take 1 tablet (10 mg total) by mouth at bedtime.   Yes Historical Provider, MD   multivitamin capsule Take 1 capsule by mouth daily.   Yes Historical Provider, MD   omeprazole (PRILOSEC) 40 MG capsule Take by mouth. 10/21/16  Yes Historical Provider, MD   ondansetron (ZOFRAN) 4 MG tablet Take 1 tablet (4 mg  total) by mouth every 6 hours as needed for Nausea. 12/30/13  Yes Barrie Folk Ditty, MD   pyridostigmine (MESTINON)  60 mg tablet Take 1 tablet (60 mg total) by mouth 3 times a day. Not taking often per pt report due to side effects 10/22/16  Yes Historical Provider, MD   tamsulosin (FLOMAX) 0.4 mg Cp24 Take 1 capsule (0.4 mg total) by mouth at bedtime.   Yes Historical Provider, MD   azaTHIOprine (IMURAN) 100 mg tablet Take 2 tablets (200 mg total) by mouth At bedtime. 12/30/13   Barrie Folk Ditty, MD   buPROPion HCL (WELLBUTRIN XL) 300 MG 24 hr tablet Take 1 tablet (300 mg total) by mouth daily.    Historical Provider, MD   buPROPion SR (WELLBUTRIN SR) 150 MG tablet Take 1 tablet (150 mg total) by mouth 2 times a day.    Historical Provider, MD   clopidogrel (PLAVIX) 75 mg tablet Take by mouth. 06/16/15   Historical Provider, MD   divalproex (DEPAKOTE) 125 MG DELAYED RELEASE tablet Take by mouth.    Historical Provider, MD   HYDROcodone-acetaminophen (NORCO) 5-325 mg per tablet TK 1 T PO  BID PRN P 12/11/16   Historical Provider, MD   ipratropium-albuterol (DUO-NEB) 0.5 mg-3 mg(2.5 mg base)/3 mL nebulizer solution Inhale into the lungs. 10/21/16   Historical Provider, MD   midodrine (PROAMATINE) 10 MG tablet Take 1 tablet (10 mg total) by mouth 3 times a day.    Historical Provider, MD   ondansetron (ZOFRAN) 4 mg/2 mL Soln injection Inject 2 mLs (4 mg total) into the vein every 6 hours as needed. 12/30/13   Barrie Folk Ditty, MD   pantoprazole (PROTONIX) 40 MG tablet Take 1 tablet (40 mg total) by mouth every morning before breakfast.    Historical Provider, MD   pravastatin (PRAVACHOL) 40 MG tablet Take 1 tablet (40 mg total) by mouth at bedtime.    Historical Provider, MD   pyridostigmine (MESTINON) 60 mg tablet Take 1 tablet (60 mg total) by mouth 3 times a day.    Historical Provider, MD   rivaroxaban (XARELTO) 20 mg Tab Take by mouth. 11/09/16   Historical Provider, MD   UNABLE TO FIND inhaler    Historical Provider, MD   venlafaxine (EFFEXOR) 75 MG tablet Take 1 tablet (75 mg total) by mouth 3 times a day.    Historical Provider, MD         Inpatient Meds:  Scheduled:   heparin  5,000 Units Subcutaneous 3 times per day       ZOX:[id number]    Continuous Infusions:    sodium chloride 0.9 %          Vital Signs     Temp:  [97.5 F (36.4 C)] 97.5 F (36.4 C)  Heart Rate:  [57-65] 63  Resp:  [15-18] 15  BP: (115-138)/(76-85) 132/80    Physical Exam     Gen: Age-appropriate adult, NAD. Alert.  HEENT: NCAT. Neck soft, supple.   CV: RRR, S1 and S2 appropriate.   PULM: CTAB. Normal respiratory effort.  ABD: Soft, NT, ND.   EXT: 2+ distal pulses bilaterally, symmetric. No edema appreciated.  SKIN: Warm and dry.   NEURO: A&O x3, answers questions appropriately, moves all extremities spontaneously.     CAM:    RASS:      Labs     Renal  Recent Labs     06/07/22  0829   NA 141   K 3.9  CL 108   CO2 26   BUN 18   CREATININE 1.08   CALCIUM 8.5*    CBC  Recent Labs     06/07/22  0829   WBC 5.3   HGB 14.4   HCT 40.6   PLT 241    Liver  Recent Labs     06/07/22  0829   INR 1.0                      VBG:     ABG:       Invalid input(s): CO2ART  UDS:  No results found for: METHADSCRNUR, METHAMSCRNUR, TCASCRNUR, AMPHETSCRNUR, BARBSCRNUR, BENZOSCRNUR, OPIATESCRNUR, PHENSCRNUR, THCSCRNUR, COCAINSCRNUR    Other risk stratification labs:  No results found for: TSH, T3TOTAL, T4TOTAL, T3FREE, FREET4, THYROIDAB  No results found for: CHOLTOT, TRIG, HDL, CHOLHDL, VLDLCHOL, LDL  No results found for: HGBA1C    Other Diagnostic Studies     No orders to display        EKG on admission :     Assessment & Plan     Andre Olson is a 72 y.o. 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    #Cardiovascular:     # angina  #  CAD (s/p thrombectomy and DES to RCA, 2013)  # hx MI  - aspirin 81mg  daily  - atorvastatin 40mg  qhs  - CT surgery consulted, appreciate recs    #Pulmonary:      Patient Vitals for the past 4 hrs:   O2 Device   06/07/22 0855 None (Room air)     # COPD  On home olodoterol/ tiotriopium, albuterol PRN  - Continue home olodaterol, tiotropium daily  - continue home montelukast 10mg  qhs  - albuterol PRn  - Flonase BID    # hx of DVT/PE (2018, 2019)  - not on [facility]    #Neurology, Pain, Sedation:   # chronic pain  - morphine 15mg  qhs  - Norco 7.5 - 325 PO TID  - baclofen 10mg  BID  - lidocaine patch    # restless legs  - topiramate 50mg  BID    # myasthenia gravis  # autonomic neuropathy  - mycophenolate 1g BID  - pyridostigmine 60mg  TID    #Gastrointestinal:  # GERD  - pantoprazole 40mg  daily    #Renal (Electrolytes, Acid/Base):  - Monitor I/Os, renal panels, replete lytes PRN.   - No acute issues.         Lab 06/07/22  0829   SODIUM 141   POTASSIUM 3.9   CHLORIDE 108   CO2 26   BUN 18   CREATININE 1.08       #Genitourinary:  # BPH  # hx Peyronie's disease  Pt reports being prescribed finasteride, but not taking at home  - continue home tamsulosin 0.4mg  qhs    #Infectious Disease:   - No acute issues.     #Endocrine:    # hx thyroid papillary cancer (s/p ablation)  - continue home levothyroxine daily    #Hematology/Oncology:    # hx thyroid papillary cancer (s/p ablation)  - continue home levothyroxine daily         Lab 06/07/22  0829  WBC 5.3   HEMOGLOBIN 14.4   HEMATOCRIT 40.6   MEAN CORPUSCULAR VOLUME 92.0   PLATELETS 241       #Psychiatry:  # MDD  # PTSD  - bupriopion 300mg  daily  - paroxetine 40mg  daily  - quetiapine 25mg  qhs    #Nutrition:  Diet/Nutrition Orders    Diet cardiac no concentrated carbohydrates [facility] only)     Frequency: Effective Now     Number of Occurrences: Until Specified     Order Questions:      Suicide/Behavior Risk Modification? No     # vitamins  - vit d3 1000u daily  - multivitamin  -  calcium carbonate 1250mg  BID    Code Status: Full Code  DVT PPx: SQH TID.   GI PPx: PPI.   PT/OT: consulted.  - PT Recs:    - OT Recs:    - SLP Recs:      SignedDurward Mallard  CVICU Resident  06/07/2022, 11:52 AM

## 2022-06-07 NOTE — Unmapped (Signed)
Left and Right Heart Cath  Brief Op Note  Andre Olson  06/07/2022      Pre-op Diagnosis: Angina       Post-op Diagnosis:   Low-normal filling pressures, preserved cardiac output  LM: patent  LAD: proximal (90%) and mid-vessel (80%) stenosis; ostial 90% diagonal 1 disease  LCX: patent  RCA: Mid-vessel 80% stenosis    LVEDP 8    Right radial artery access (TR band in place 13 mmHg), Right IJ vein access (Manual hold)    RECOMMENDATIONS:  Admit to CVICU A team (accepted by Dr. Andrey Cota) for heart-team approach to revascularization. CT surgery consult needed.    Procedure(s):  Left and Right Heart Cath      Surgeon(s):  Woodfin Ganja, MD    Anesthesia: Moderate Sedation    Staff:   Circulator: Renard Hamper, RN  Radiology Technologist: Vista Deck  Documenter: Farrel Conners    Estimated Blood Loss:  10 mL                 There were no complications unless listed below.       [name]     Date: 06/07/2022  Time: 11:27 AM

## 2022-06-07 NOTE — Unmapped (Signed)
Pt returned from [place] [facility] lab status post LHC/RHC. Bedside report received from M. Justine Null, RN at 1140.           Pt placed on telemetry, serial vital signs set up. Access site: R radial and R jugular. Sites are soft, no bleeding/hematoma, 7 F jugular sheath remains in place, tegaderm drsg, tender to touch. Radial sheath removed in cath lab at 1120, TR band placed to site with 13 ml air instilled to obtain hemostasis. RUE is warm, natural color,  radial pulse +1. Pt A&Ox3, SR per tele, VSS. C/o jugular site pain, 5/10, requesting home dose of percocet (paged team and requested meds). Pt c/o SOB, also notified cvi-a team. Lungs diminished, saturation WNL, reports feeling this way at home and using inhalers with relief.    Pt instructed on plan of care; frequent site monitoring,  & continuous telemetry and vitals. Pt educated on keeping RU extremity straight, no bending or lifting. Also encouraged pt to call RN immediately with any new or increased pain, shortness of breath, bleeding, numbness/tingling, or swelling. Given menu to order and snack/drink.    Pt verbalized understanding. Call light placed within reach. Denies any needs/concerns at this time. Will monitor closely.

## 2022-06-07 NOTE — Unmapped (Signed)
[place] [facility]  PRE-SEDATION ASSESSMENT, HISTORY & PHYSICAL    Date: 06/07/2022     Andre Olson is a 72 y.o. year old male MRN: [id number]    Pre-Procedure Diagnosis/Procedure Indication: chest pain  Planned Procedure: RHC/LHC  NPO for solids >8 hours, NPO for liquids >8 hours  Code Status: Full Code      Past Medical History     Past Medical History:   Diagnosis Date    Benign prostate hyperplasia     Cancer (CMS-HCC)     COPD (chronic obstructive pulmonary disease) (CMS-HCC)     Coronary artery disease     Depression     GERD (gastroesophageal reflux disease)     Hypertension     Hypothyroid     Myasthenia gravis (CMS-HCC)     Orthostatic hypotension      Difficult intubation Unanswered       Patient Active Problem List   Diagnosis    Stenosis, cervical spine    Syncope    Myasthenia gravis with exacerbation, adult form (CMS-HCC)       Past Surgical History     Past Surgical History:   Procedure Laterality Date    TOTAL THYROIDECTOMY  March 1995    ulnar nerve release         Medications     Prior to Admission medications    Medication Sig Start Date End Date Taking? Authorizing Provider   atorvastatin (LIPITOR) 40 MG tablet Take by mouth.   Yes Historical Provider, MD   azelastine (ASTELIN) 137 mcg (0.1 %) nasal spray Use 2 sprays into each nostril 2 times a day. Use in each nostril as directed   Yes Historical Provider, MD   baclofen (LIORESAL) 10 MG tablet Take by mouth 2 times a day.   Yes Historical Provider, MD   budesonide-formoterol (SYMBICORT) 80-4.5 mcg/actuation inhaler Inhale 2 puffs into the lungs 2 times a day.   Yes Historical Provider, MD   cyanocobalamin (VITAMIN B-12) 1000 MCG tablet Take 1 tablet (1,000 mcg total) by mouth daily.   Yes Historical Provider, MD   finasteride (PROSCAR) 5 mg tablet Take 1 tablet (5 mg total) by mouth daily.   Yes Historical Provider, MD   HYDROcodone-acetaminophen (NORCO) 7.5-325 mg per tablet Take 1 tablet by mouth 3 times a day.   Yes Historical Provider, MD    ipratropium (ATROVENT) 0.03 % nasal spray Use 2 sprays into each nostril every 12 (twelve) hours.   Yes Historical Provider, MD   levothyroxine (SYNTHROID, LEVOTHROID) 112 MCG tablet Take 2 tablets (224 mcg total) by mouth daily.   Yes Historical Provider, MD   montelukast (SINGULAIR) 10 mg tablet Take 1 tablet (10 mg total) by mouth at bedtime.   Yes Historical Provider, MD   multivitamin capsule Take 1 capsule by mouth daily.   Yes Historical Provider, MD   omeprazole (PRILOSEC) 40 MG capsule Take by mouth. 10/21/16  Yes Historical Provider, MD   ondansetron (ZOFRAN) 4 MG tablet Take 1 tablet (4 mg total) by mouth every 6 hours as needed for Nausea. 12/30/13  Yes Imelda Pillow Ditty, MD   pyridostigmine (MESTINON) 60 mg tablet Take 1 tablet (60 mg total) by mouth 3 times a day. Not taking often per pt report due to side effects 10/22/16  Yes Historical Provider, MD   tamsulosin (FLOMAX) 0.4 mg Cp24 Take 1 capsule (0.4 mg total) by mouth at bedtime.   Yes Historical Provider, MD   azaTHIOprine (IMURAN) 100  mg tablet Take 2 tablets (200 mg total) by mouth At bedtime. 12/30/13   Barrie Folk Ditty, MD   buPROPion HCL (WELLBUTRIN XL) 300 MG 24 hr tablet Take 1 tablet (300 mg total) by mouth daily.    Historical Provider, MD   buPROPion SR (WELLBUTRIN SR) 150 MG tablet Take 1 tablet (150 mg total) by mouth 2 times a day.    Historical Provider, MD   clopidogrel (PLAVIX) 75 mg tablet Take by mouth. 06/16/15   Historical Provider, MD   divalproex (DEPAKOTE) 125 MG DELAYED RELEASE tablet Take by mouth.    Historical Provider, MD   HYDROcodone-acetaminophen (NORCO) 5-325 mg per tablet TK 1 T PO  BID PRN P 12/11/16   Historical Provider, MD   ipratropium-albuterol (DUO-NEB) 0.5 mg-3 mg(2.5 mg base)/3 mL nebulizer solution Inhale into the lungs. 10/21/16   Historical Provider, MD   midodrine (PROAMATINE) 10 MG tablet Take 1 tablet (10 mg total) by mouth 3 times a day.    Historical Provider, MD   ondansetron (ZOFRAN) 4 mg/2 mL Soln  injection Inject 2 mLs (4 mg total) into the vein every 6 hours as needed. 12/30/13   Barrie Folk Ditty, MD   pantoprazole (PROTONIX) 40 MG tablet Take 1 tablet (40 mg total) by mouth every morning before breakfast.    Historical Provider, MD   pravastatin (PRAVACHOL) 40 MG tablet Take 1 tablet (40 mg total) by mouth at bedtime.    Historical Provider, MD   pyridostigmine (MESTINON) 60 mg tablet Take 1 tablet (60 mg total) by mouth 3 times a day.    Historical Provider, MD   rivaroxaban (XARELTO) 20 mg Tab Take by mouth. 11/09/16   Historical Provider, MD   UNABLE TO FIND inhaler    Historical Provider, MD   venlafaxine (EFFEXOR) 75 MG tablet Take 1 tablet (75 mg total) by mouth 3 times a day.    Historical Provider, MD     Allergies:  Dilaudid  [Hydromorphone (Bulk)]  Lamotrigine    Abbreviated Review of Systems (ROS)     Functional Capacity: [employer] ACTIVITY SCALE: 1 - Eating, getting dressed; working at a desk.  Chest Pain: yes  Shortness of Breath/Dyspnea or Exertion: yes  Recent URI: Yes    Airway, ASA Score & Sedation Specific History Concerns     Mallampati II  ASA II    Focused Physical Exam:     Height ; Weight ; BMI Body mass index is 17.51 kg/m.  Vitals:    06/07/22 0905   BP: 126/81   Pulse: 59   Resp: 15   Temp:    SpO2: 100%     Neuro: aaox3, moves all extremities  Cardiovascular: RRR, warm and well perfused  Respiratory: speaks in full sentences, unlabored breathing    AUC For Cath     CAD Presentation  Visit Indicators: Worsening Angina    Stable Chest Pain Symptom Assessment (if applicable)  Typical Angina patient on at least two maximally tolerated Anti-Anginals: No    Heart Failure  No    [facility] Clinical Frailty Scale  Managing Well      Sedation Plan: Monitored sedation by cardiology staff    Antibiotic prophylaxis is not indicated.    Kieth Brightly, DO  Cardiovascular Diseases Fellow  PGY-5

## 2022-06-07 NOTE — Unmapped (Signed)
Pt admitted to CVR 14 in preparation of a LHC/RHC. Verified pt name and DOB, arm band in place. Height and weight obtained upon admission. Pt undressed and in gown in preparation of procedure. Person responsible for transportation at discharge: wife, Deborah/Meets criteria for SDD.          Pt A&Ox4, Vital signs stable. Pre-procedure checklist completed. Admission assessment completed. Allergies verified- arm band placed. Accessed LCW port with #20, 1inch catheter, pt tolerated well. NPO > 6 hours. Groin shaved in preparation of procedure. 325mg  aspirin given per protocol. Held off on starting fluids due to pt reports of shortness of breath/chf.    Pt oriented to room and call light. Call light within reach. Non-skid socks on, bed wheels locked. Family at bedside. Pt denies any needs/concerns at this time. Awaiting procedure.

## 2022-06-08 LAB — RENAL FUNCTION PANEL W/EGFR
Albumin: 3.6 g/dL (ref 3.5–5.7)
Anion Gap: 7 mmol/L (ref 3–16)
BUN: 18 mg/dL (ref 7–25)
CO2: 26 mmol/L (ref 21–33)
Calcium: 9.1 mg/dL (ref 8.6–10.3)
Chloride: 105 mmol/L (ref 98–110)
Creatinine: 1.15 mg/dL (ref 0.60–1.30)
EGFR: 67
Glucose: 101 mg/dL (ref 70–100)
Osmolality, Calculated: 288 mOsm/kg (ref 278–305)
Phosphorus: 4.1 mg/dL (ref 2.1–4.5)
Potassium: 4 mmol/L (ref 3.5–5.3)
Sodium: 138 mmol/L (ref 133–146)

## 2022-06-08 LAB — CBC
Hematocrit: 38.8 % (ref 38.5–50.0)
Hemoglobin: 13.8 g/dL (ref 13.2–17.1)
MCH: 32.7 pg (ref 27.0–33.0)
MCHC: 35.6 g/dL (ref 32.0–36.0)
MCV: 91.8 fL (ref 80.0–100.0)
MPV: 7.5 fL (ref 7.5–11.5)
Platelets: 226 10*3/uL (ref 140–400)
RBC: 4.23 10*6/uL (ref 4.20–5.80)
RDW: 12.7 % (ref 11.0–15.0)
WBC: 5.8 10*3/uL (ref 3.8–10.8)

## 2022-06-08 LAB — MAGNESIUM: Magnesium: 1.7 mg/dL (ref 1.5–2.5)

## 2022-06-08 MED ORDER — magnesium oxide (MAG-OX) tablet 400 mg
400 | Freq: Every day | ORAL | Status: AC
Start: 2022-06-08 — End: 2022-06-14
  Administered 2022-06-08 – 2022-06-13 (×6): 400 mg via ORAL

## 2022-06-08 MED ORDER — magnesium sulfate in sterile water 50 mL IVPB 2 g
2 | Freq: Once | INTRAVENOUS
Start: 2022-06-08 — End: 2022-06-08

## 2022-06-08 MED ORDER — proMETHazine (PHENERGAN) tablet 12.5 mg
25 | Freq: Once | ORAL | Status: AC
Start: 2022-06-08 — End: 2022-06-08
  Administered 2022-06-08: 12:00:00 12.5 mg via ORAL

## 2022-06-08 MED ORDER — magnesium oxide (MAG-OX) tablet 400 mg
400 | Freq: Three times a day (TID) | ORAL
Start: 2022-06-08 — End: 2022-06-08

## 2022-06-08 MED FILL — BACLOFEN 5 MG TABLET: 5 5 mg | ORAL | Qty: 2

## 2022-06-08 MED FILL — TOPIRAMATE 50 MG TABLET: 50 50 MG | ORAL | Qty: 1

## 2022-06-08 MED FILL — VITAMIN B-12  1,000 MCG TABLET: 1000 1000 MCG | ORAL | Qty: 1

## 2022-06-08 MED FILL — ONDANSETRON HCL 4 MG TABLET: 4 4 MG | ORAL | Qty: 1

## 2022-06-08 MED FILL — PROMETHAZINE 25 MG TABLET: 25 25 MG | ORAL | Qty: 1

## 2022-06-08 MED FILL — MAGNESIUM SULFATE 2 GRAM/50 ML (4 %) IN WATER INTRAVENOUS PIGGYBACK: 2 2 gram/50 mL (4 %) | INTRAVENOUS | Qty: 50

## 2022-06-08 MED FILL — OYSTER SHELL CALCIUM 500  500 MG CALCIUM (1,250 MG) TABLET: 500 500 mg calcium (1,250 mg) | ORAL | Qty: 1

## 2022-06-08 MED FILL — ATORVASTATIN 40 MG TABLET: 40 40 MG | ORAL | Qty: 1

## 2022-06-08 MED FILL — BUPROPION HCL XL 300 MG 24 HR TABLET, EXTENDED RELEASE: 300 300 MG | ORAL | Qty: 1

## 2022-06-08 MED FILL — HYDROCODONE 7.5 MG-ACETAMINOPHEN 325 MG TABLET: 7.5-325 7.5-325 mg | ORAL | Qty: 1

## 2022-06-08 MED FILL — CHOLECALCIFEROL (VITAMIN D3) 25 MCG (1,000 UNIT) TABLET: 1000 1000 units | ORAL | Qty: 1

## 2022-06-08 MED FILL — HEPARIN (PORCINE) 5,000 UNIT/ML INJECTION SOLUTION: 5000 5,000 unit/mL | INTRAMUSCULAR | Qty: 1

## 2022-06-08 MED FILL — PYRIDOSTIGMINE BROMIDE 60 MG TABLET: 60 60 mg | ORAL | Qty: 1

## 2022-06-08 MED FILL — MONTELUKAST 10 MG TABLET: 10 10 mg | ORAL | Qty: 1

## 2022-06-08 MED FILL — PAROXETINE 40 MG TABLET: 40 40 MG | ORAL | Qty: 1

## 2022-06-08 MED FILL — CELLCEPT 500 MG TABLET: 500 500 mg | ORAL | Qty: 2

## 2022-06-08 MED FILL — SYNTHROID 175 MCG TABLET: 175 175 mcg | ORAL | Qty: 1

## 2022-06-08 MED FILL — BACLOFEN 10 MG TABLET: 10 10 MG | ORAL | Qty: 1

## 2022-06-08 MED FILL — MORPHINE ER 15 MG TABLET,EXTENDED RELEASE: 15 15 MG | ORAL | Qty: 1

## 2022-06-08 MED FILL — SEROQUEL 25 MG TABLET: 25 25 mg | ORAL | Qty: 1

## 2022-06-08 MED FILL — ASPIRIN 81 MG TABLET,DELAYED RELEASE: 81 81 MG | ORAL | Qty: 1

## 2022-06-08 MED FILL — TAMSULOSIN 0.4 MG CAPSULE: 0.4 0.4 mg | ORAL | Qty: 1

## 2022-06-08 MED FILL — OXYCODONE-ACETAMINOPHEN 7.5 MG-325 MG TABLET: 7.5-325 7.5-325 mg | ORAL | Qty: 1

## 2022-06-08 MED FILL — QUETIAPINE 12.5 MG DOSE: 12.5 12.5 MG | ORAL | Qty: 2

## 2022-06-08 MED FILL — PANTOPRAZOLE 40 MG TABLET,DELAYED RELEASE: 40 40 MG | ORAL | Qty: 1

## 2022-06-08 MED FILL — MAGNESIUM OXIDE 400 MG (241.3 MG MAGNESIUM) TABLET: 400 400 mg | ORAL | Qty: 1

## 2022-06-08 MED FILL — LEVOTHYROXINE 75 MCG TABLET: 75 75 MCG | ORAL | Qty: 1

## 2022-06-08 MED FILL — THERA 400 MCG TABLET: 400 400 mcg | ORAL | Qty: 1

## 2022-06-08 NOTE — Unmapped (Signed)
CVICU Daily Progress Note    Patient: Chrstopher Malenfant  MRN: [id number]  Room: 2706/C3762    Code Status:     Background / Hospital Course     Nilesh Stegall is a 72 y.o. 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    Pt has been having worsening dyspnea the past month, with concurrent chest pain. Hence, pt arrived at [facility] today for his scheduled LHC/RHC. Dyspnea at rest, is not related with exertion. Chest pain occurs at rest, of pressure-like quality that radiates to left axilla. Chest pain intermittent, 3/10. Pt reports chest pain similar to that when he had an MI about a decade ago. Pt reports prior tobacco smoker ~50 pack-year hx, stopped smoking about 5 years ago. Drinks 3 beers/day for past 50 years.     Upon arrival at [facility] on 06/07/2022, pt was HDS/AF. BMP wnl, CBC wnl. ECG showed no e/o ischemia. ASA 325mg  load given.pt went to cath lab. LHC showed proximal (90%) and mid-LAD (80%) stenosis; ostial 90% diagonal 1 disease. Mid-RCA 80% stenosis. RHC was wnl, w/ low-normal filling pressures, preserved cardiac output. Pt admitted floor level of care w/ CVICU as primary team for further CT surgery evaluation.    Interval History / Subjective     Significant events/labs/imaging over the past 24 hours:  - No acute concerns or new issues overnight,  - Seroquel dose reduced to 12.5mg , PRN imodium added, and one time dose of PO Phenergan for nausea.    Brief Daily Plan:  [X]  Neurology consult for assistance with MG management peri-op.  - Vein Mapping.  - Carotid Duplex.  - PFT's.  - TTE.  - CT Coronary for MICS-CABG eval.  - Replace Magnesium orally. IV MAGNESIUM CAN PRECIPITATE A MG CRISIS.  -  cMRI to assess Thrombus.    Assessment & Plan     Hilary Pundt is a 72 y.o. 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     Cardiovascular:    #Multi-vessal CAD  CT Surgery consulted. CABG candidacy currently underway.  Plan:  Cardiac Surgery consulted and we appreciate her recommendations;   - Neurology consult for assistance with MG management peri-op.  - Vein Mapping  - Carotid Duplex  - PFT's  - TTE  - CT Coronary for MICS-CABG eval.  - Continue ASA 81mg , Atorvastatin40mg  daily.    Neurology, Pain, Sedation:   #chronic pain  - morphine 15mg  qhs.  - Norco 7.5 - 325 PO TID.  - baclofen 10mg  BID.  - lidocaine patch.     #restless legs  - topiramate 50mg  BID.    #myasthenia gravis  #autonomic neuropathy  Neurology consulted for recommendations on IP management and peri-op management of MG.  Plan:  - Neurology consulted and we appreciate their recommendations.   - obtain baseline respiratory parameters (NIF and VC).  - continue mestinon 60 TID + cellcept 1gm BID.  - AVOID if possible: IV Magnesium, beta  blockers, curare and related drugs, aminoglycosides, macrolides, FQN, CCB,   quinine and procainamide.  - Continue mycophenolate 1g BID.  - Continue pyridostigmine 60mg  TID.    Pulmonary:      #COPD  - Continue home olodaterol, tiotropium daily  - continue home montelukast 10mg  qhs  - albuterol PRN.  - Flonase BID.    #hx of DVT/PE (2018, 2019)  - not on AC.    Gastrointestinal:  #Diarrhea  -PRN Imodium.    Renal (Electrolytes, Acid/Base):  - No acute issues.   - Monitor I/Os, renal panels, replete lytes PRN.     Genitourinary:  #BPH  #hx Peyronie's disease  Pt reports being prescribed finasteride, but not  taking at home.  - continue home tamsulosin 0.4mg  qhs.    Infectious Disease:   Antibiotics:  None    Cultures:  None    - No acute issues.    Endocrine:  - No acute issues. Goal BG <180.    Hematology/Oncology:  #hx thyroid papillary cancer (s/p ablation)  - continue home levothyroxine 1106mcg daily    Rheumatology:  - No acute issues.    Musculoskeletal:  - No acute issues.    Psychiatry:  #MDD  #PTSD  - bupriopion 300mg  daily  - paroxetine 40mg  daily  - quetiapine 25mg  qhs    F/E/N:  - Repalce electrolytes PRN.  - Nutrition: Cardiac Diet  - SLP: N/A.  - Nutrition eval: N/A.    Diet/Nutrition Orders    Diet cardiac(low fat, salt, cholesterol)     Frequency: Effective Now     Number of Occurrences: Until Specified     Order Questions:      Suicide/Behavior Risk Modification? No     Invasive lines: None.  Patient Lines/Drains/Airways Status       Active Line / PIV Line       Name Placement date Placement time Site Days    Port A Cath  Left Chest --  --  Chest  --    Peripheral IV Left Forearm --  --  Forearm  --                  Foley: None.  Pressors: None  Sedation: None  Other Drips: None  Restraints: Non    DVT PPx: SubQ Heparin    GI PPx: PO Protonix    PT/OT: consulted.  PT Recs:    OT Recs:    SLP Recs:      Family discussion: ongoing.   Disposition: Floor status.    Vital Signs & Physical Exam     Vitals:    06/08/22 1155   BP: 114/61   Pulse: 58   Resp: 18   Temp: 97.5 F (36.4 C)   SpO2: 99%     Physical Exam  Constitutional:       General: He is not in acute distress.     Appearance: Normal appearance. He is not ill-appearing.   Eyes:      General: No scleral icterus.     Extraocular Movements: Extraocular movements intact.   Cardiovascular:      Rate and Rhythm: Regular rhythm. Bradycardia present.      Pulses: Normal pulses.           Posterior tibial pulses are 2+ on the right side and 2+ on the left side.      Heart sounds: Murmur heard.      No friction rub. No gallop.   Pulmonary:  Effort:  Pulmonary effort is normal.      Breath sounds: Normal breath sounds.   Abdominal:      General: Bowel sounds are normal.      Palpations: Abdomen is soft.   Musculoskeletal:      Right lower leg: No edema.      Left lower leg: No edema.   Skin:     General: Skin is warm and dry.      Capillary Refill: Capillary refill takes less than 2 seconds.   Neurological:      Mental Status: He is alert and oriented to person, place, and time.      Motor: Motor function is intact. No weakness.      Comments: Strength 4/5 in all ROM in bilateral LE.  Strength 3.5/5 in abduction, adduction, flexion and extension of bilateral UE.        CAM:    RASS:      Admit Wt: Weight: 159 lb 6.4 oz (72.3 kg)     Recent Results     All laboratory and imaging data reviewed.

## 2022-06-08 NOTE — Unmapped (Signed)
[facility]  Department of Neurology and Rehab Medicine  Initial Neurology Consult Note (History and Physical)    8:04 AM  06/08/2022  Patient:Andre Olson  LOS: 1 days    Reason for Consult myasthenia gravis peri-op  House Officer:  Neta Mends, MD   Requesting MD/Contact #:  Collier Flowers, MD    Impression and Recommendations   Andre Olson is a 72 y.o. male on hospital day 1.      We note the following Neurological issues being addressed in today's encounter:    #seronegative myasthenia gravis  Follows at the [place]. No recent med changes that patient can remember. Most recent neurology appointment 11/28 with Dr. Merilynn Finland.  - obtain baseline respiratory parameters (NIF and FVC)  - continue mestinon 60 TID + cellcept 1gm BID  - AVOID if possible: IV Magnesium, beta blockers, curare and related drugs, aminoglycosides, macrolides, FQN, CCB, quinine and procainamide  - would discuss neuromuscular blocking agents with anesthesia during pre-op evaluation    We appreciate the opportunity to help in the care of [employer]. Patient was seen and discussed with consult attending, Dr. Jonna Coup. If you have any questions, please page [phone], and the covering resident will respond promptly.     Signed,  Neta Mends, MD  Neurology Resident Physician (PGY-2)  [facility]  [place]  8:04 AM 06/08/2022      Chief Complaint and History of the Present Illness     Chief Complaint, Reason for Consult: myasthenia gravis peri-op    Andre Olson is a 72 y.o., L-handed male with a past medical history of myasthenia gravix, CAD, autonomic neuropathy, DVT/PE s/p EKOS (2018), recurrent PE (2019), papillary thyroid cancer, COPD who presented for [facility] 06/07/22, admitted afterwards for CABG evaluation.     Patient reports progressively worsening dyspnea and concurrent chest pain, for which he underwent LHC 06/07/22 showing multivessel CAD. He is now undergoing  CABG evaluation.    [facility] for [facility] grav[facility]. He does note dyspnea with exertion/when tired but unclear if this is due to cardiac etiology of myasthenia. He reports some diplopia near the end of the day. Ptosis occurs a few times per month. He has no difficulty with chewing or swallowing. Patient is currently on regimen of mestinon 60mg  TID + Cellcept 1gm BID. He is NOT on any steroids currently.     Review of Systems     + dyspnea, chest pain  + diplopia, worse with fatigue   + intermittent ptosis  - swallowing difficulties    Histories     Medical:   Past Medical History:   Diagnosis Date    Benign prostate hyperplasia     Cancer (CMS-HCC)     COPD (chronic obstructive pulmonary disease) (CMS-HCC)     Coronary artery disease     Depression     GERD (gastroesophageal reflux disease)     Hypertension     Hypothyroid     Myasthenia gravis (CMS-HCC)     Orthostatic hypotension         Surgical:    has a past surgical history that includes Total thyroidectomy (March 1995) and ulnar nerve release.     Family:  family history is not on file.    Social:   reports that he quit smoking about 22 years ago. His smoking use included cigarettes. He has a 36.00 pack-year smoking history. He has never used  smokeless tobacco. He reports current alcohol use of about 12.0 - 14.0 standard drinks of alcohol per week. He reports that he does not use drugs.    Medications and Allergies     Allergies   Allergen Reactions    Dilaudid  [Hydromorphone (Bulk)]      hallucinations    Lamotrigine Hives       PTA Medications:  Medications Prior to Admission   Medication Sig Dispense Refill Last Dose    atorvastatin (LIPITOR) 40 MG tablet Take by mouth.   06/06/2022    azaTHIOprine (IMURAN) 100 mg tablet Take 2 tablets (200 mg total) by mouth At bedtime.  0 06/06/2022    azelastine (ASTELIN) 137 mcg (0.1 %) nasal spray Use 2 sprays into each nostril 2 times a day. Use in each nostril as directed   06/06/2022    baclofen  (LIORESAL) 10 MG tablet Take by mouth 2 times a day.   06/06/2022 at 1500    budesonide-formoterol (SYMBICORT) 80-4.5 mcg/actuation inhaler Inhale 2 puffs into the lungs 2 times a day.   06/06/2022    cyanocobalamin (VITAMIN B-12) 1000 MCG tablet Take 1 tablet (1,000 mcg total) by mouth daily.   06/06/2022    HYDROcodone-acetaminophen (NORCO) 7.5-325 mg per tablet Take 1 tablet by mouth 3 times a day.   06/06/2022    ipratropium (ATROVENT) 0.03 % nasal spray Use 2 sprays into each nostril every 12 (twelve) hours.   06/06/2022    levothyroxine (SYNTHROID, LEVOTHROID) 112 MCG tablet Take 2 tablets (224 mcg total) by mouth daily.   06/06/2022    montelukast (SINGULAIR) 10 mg tablet Take 1 tablet (10 mg total) by mouth at bedtime.   06/06/2022 at 2100    multivitamin capsule Take 1 capsule by mouth daily.   06/06/2022 at 1200    omeprazole (PRILOSEC) 40 MG capsule Take by mouth.   06/06/2022 at 2000    ondansetron (ZOFRAN) 4 MG tablet Take 1 tablet (4 mg total) by mouth every 6 hours as needed for Nausea. 20 tablet 0 Past Month    pyridostigmine (MESTINON) 60 mg tablet Take 1 tablet (60 mg total) by mouth 3 times a day. Not taking often per pt report due to side effects   Past Week    tamsulosin (FLOMAX) 0.4 mg Cp24 Take 1 capsule (0.4 mg total) by mouth at bedtime.   06/06/2022    buPROPion HCL (WELLBUTRIN XL) 300 MG 24 hr tablet Take 1 tablet (300 mg total) by mouth daily.       buPROPion SR (WELLBUTRIN SR) 150 MG tablet Take 1 tablet (150 mg total) by mouth 2 times a day.       clopidogrel (PLAVIX) 75 mg tablet Take by mouth.       divalproex (DEPAKOTE) 125 MG DELAYED RELEASE tablet Take by mouth.       finasteride (PROSCAR) 5 mg tablet Take 1 tablet (5 mg total) by mouth daily.   Unknown at 1800    HYDROcodone-acetaminophen (NORCO) 5-325 mg per tablet TK 1 T PO  BID PRN P  0     ipratropium-albuterol (DUO-NEB) 0.5 mg-3 mg(2.5 mg base)/3 mL nebulizer solution Inhale into the lungs.       midodrine (PROAMATINE) 10 MG tablet Take 1 tablet  (10 mg total) by mouth 3 times a day.       ondansetron (ZOFRAN) 4 mg/2 mL Soln injection Inject 2 mLs (4 mg total) into the vein every 6 hours as needed.  pantoprazole (PROTONIX) 40 MG tablet Take 1 tablet (40 mg total) by mouth every morning before breakfast.       pravastatin (PRAVACHOL) 40 MG tablet Take 1 tablet (40 mg total) by mouth at bedtime.       pyridostigmine (MESTINON) 60 mg tablet Take 1 tablet (60 mg total) by mouth 3 times a day.       rivaroxaban (XARELTO) 20 mg Tab Take by mouth.       UNABLE TO FIND inhaler       venlafaxine (EFFEXOR) 75 MG tablet Take 1 tablet (75 mg total) by mouth 3 times a day.          Current Medications:  Scheduled Meds:   aspirin  81 mg Oral Daily with breakfast    atorvastatin  40 mg Oral Nightly (2100)    baclofen  10 mg Oral BID    buPROPion HCL  300 mg Oral Daily 0900    calcium carbonate 1250 mg tablet (500 mg elemental calcium)  1,250 mg Oral BID    cholecalciferol (vitamin D3)  1,000 Units Oral Daily 0900    cyanocobalamin  1,000 mcg Oral Daily 0900    fluticasone propionate  1 spray Each Nare BID    heparin  5,000 Units Subcutaneous 3 times per day    HYDROcodone-acetaminophen  1 tablet Oral TID    levothyroxine  175 mcg Oral DAILY 0600    lidocaine  2 patch Transdermal Daily 0900    magnesium oxide  400 mg Oral Daily 0900    montelukast  10 mg Oral Nightly (2100)    morphine SR  15 mg Oral Nightly (2100)    multivitamin with folic acid  1 tablet Oral Daily 0900    mycophenolate  1,000 mg Oral BID    olodateroL  2 puff Inhalation RT Daily    oxyCODONE-acetaminophen  1 tablet Oral Once    pantoprazole  40 mg Oral DAILY 0600    PARoxetine  40 mg Oral Daily 0900    pyridostigmine  60 mg Oral TID    QUEtiapine  12.5 mg Oral Nightly (2100)    tamsulosin  0.4 mg Oral Nightly (2100)    topiramate  50 mg Oral BID    umeclidinium  62.5 mcg Inhalation RT Daily     Continuous Infusions:  PRN Meds:.acetaminophen, albuterol, loperamide, ondansetron, propylene  glycoL    Physical Exam:   Temp:  [97.3 F (36.3 C)-97.6 F (36.4 C)] 97.6 F (36.4 C)  Heart Rate:  [50-87] 65  Resp:  [11-26] 18  BP: (98-144)/(65-85) 98/65  Wt Readings from Last 3 Encounters:   06/07/22 159 lb 6.4 oz (72.3 kg)   12/17/16 193 lb (87.5 kg)   12/30/13 164 lb 8 oz (74.6 kg)     Vital signs: BP, HR, and RR reviewed    General Medical Exam  CONSTITUTIONAL: well-appearing, in no acute distress, appears stated age  EYES: Pupils round and reactive, no conjunctival injection. R eye slightly adducted at baseline  HENT: normocephalic, atraumatic, Hearing is grossly normal   NECK:  Neck is supple and non-rigid.  No grossly enlarged lymph nodes.  Trachea is midline.    CV: regular rate, brisk dorsalis pedis pulses bilaterally, warm and well perfused, no significant peripheral edema  RESP: No accessory muscle use, no increased WOB on RA  AB: soft, nontender to palpation  MSK: intact ROM in neck and 4 extremities, no tenderness to palpation of  hand and feet joints,  no clubbing   SKIN: warm, dry, intact with normal turgor, no appreciable lesions  PSYCH: Appropriate behavior, calm mood, congruent affect, no audiovisual hallucinations mentioned    Neurologic Exam  MG exam: breath count 32  Ptosis, tires on upgaze 15-20sec  No weakness on sustained UE strength testing x60s    Appearance: well-developed, well-nourished and in no acute distress   Language/Speech: no aphasia and no dysarthria. No Right Left disorientation  Atten/concentration: awake and alert  Orientation: oriented to self, year, month, location, situation    CN:  II: VFF intact to finger count, PERRL bilaterally, no APD  III, IV, VI: R eye adducted at baseline. EOMI with smooth pursuit, no nystagmus   V: Facial sensation to light touch intact in V1, V2, V3  VII: Facial motion and strength was intact/symmetric   VIII: Hearing intact to voice  IX, V: Symmetric palatal elevation  XI: Shoulder shrug symmetric   XII: Tongue exam reveals midline  protrusion    Motor: Bulk was normal. Tone in BUE/BLE was normal. No adventitial movements noted.      Strength:   R L   R L   Deltoid 5 5  Hip Flex 5 5   Biceps 5 5  Knee Flex 5 5   Triceps 5 5  Knee Ext 5 5   Grip 5 5  Dorsiflex 5 5       Plantarflex 5 5     Sense: intact light touch in 4 extremities. No extinction to DSS.      Reflexes:    R L   Biceps 2 2   Brachioradialis 2 2   Patellar 2+ 2+   Achilles 2 2   Toes down down     Station/Gait: Deferred    Database   All laboratory findings from the past 24 hours and prior as well as all imaging, diagnostic studies and procedures were reviewed personally by me and the consult team.     Lab Results   Component Value Date    GLUCOSE 101 (H) 06/08/2022    BUN 18 06/08/2022    CO2 26 06/08/2022    CREATININE 1.15 06/08/2022    K 4.0 06/08/2022    NA 138 06/08/2022    CL 105 06/08/2022    CALCIUM 9.1 06/08/2022     No results found for: GLUFAST  Lab Results   Component Value Date    WBC 5.8 06/08/2022    HGB 13.8 06/08/2022    HCT 38.8 06/08/2022    MCV 91.8 06/08/2022    PLT 226 06/08/2022     Lab Results   Component Value Date    INR 1.0 06/07/2022     No results found for: LIPIDCOMM, CHOLTOT, TRIG, HDL, CHOLHDL, LDL  No results found for: HGBA1C  No results found for: FOLATE, VITAMINB12  No results found for: VITD25H  No results found for: HIV1X2  No results found for: HIV1O2AB  No results found for: HIV1O2QUAL   No results found for: HIV12ABAGN  No results found for: RPRTITER  No results found for: RPR    IMAGING  X-ray Portable Chest   Final Result   IMPRESSION:    Negative expiratory portable chest.      Report Verified by: Nada Boozer, MD at 06/07/2022 2:40 PM EST      VASC Vein Mapping Lower Extremity Bilateral    (Results Pending)   VASC Carotid Duplex Bilateral    (Results Pending)  CTA  No results found for the past 24 months

## 2022-06-08 NOTE — Unmapped (Signed)
Problem: Safety  Goal: Patient will be injury free during hospitalization  Description: Assess and monitor vitals signs, neurological status including level of consciousness and orientation. Assess patient's risk for falls and implement fall prevention plan of care and interventions per hospital policy.      Ensure arm band on, uncluttered walking paths in room, adequate room lighting, call light and overbed table within reach, bed in low position, wheels locked, side rails up per policy, and non-skid footwear provided.   Outcome: Progressing     Problem: Patient will remain free of falls  Goal: Universal Fall Precautions  Outcome: Progressing     Problem: Daily Care  Goal: Daily care needs are met  Description: Assess and monitor ability to perform self care and identify potential discharge needs.  Outcome: Progressing

## 2022-06-09 LAB — RENAL FUNCTION PANEL W/EGFR
Albumin: 3.6 g/dL (ref 3.5–5.7)
Anion Gap: 7 mmol/L (ref 3–16)
BUN: 14 mg/dL (ref 7–25)
CO2: 27 mmol/L (ref 21–33)
Calcium: 8.6 mg/dL (ref 8.6–10.3)
Chloride: 105 mmol/L (ref 98–110)
Creatinine: 1.3 mg/dL (ref 0.60–1.30)
EGFR: 58
Glucose: 84 mg/dL (ref 70–100)
Osmolality, Calculated: 288 mosm/kg (ref 278–305)
Phosphorus: 4.4 mg/dL (ref 2.1–4.5)
Potassium: 3.9 mmol/L (ref 3.5–5.3)
Sodium: 139 mmol/L (ref 133–146)

## 2022-06-09 LAB — MAGNESIUM: Magnesium: 1.8 mg/dL (ref 1.5–2.5)

## 2022-06-09 LAB — CBC
Hematocrit: 36.8 % (ref 38.5–50.0)
Hemoglobin: 13.3 g/dL (ref 13.2–17.1)
MCH: 33.2 pg (ref 27.0–33.0)
MCHC: 36 g/dL (ref 32.0–36.0)
MCV: 92.1 fL (ref 80.0–100.0)
MPV: 7.7 fL (ref 7.5–11.5)
Platelets: 207 10*3/uL (ref 140–400)
RBC: 4 10*6/uL (ref 4.20–5.80)
RDW: 12.8 % (ref 11.0–15.0)
WBC: 5.5 10*3/uL (ref 3.8–10.8)

## 2022-06-09 MED ORDER — levothyroxine (SYNTHROID) tablet 175 mcg
100 | Freq: Every day | ORAL
Start: 2022-06-09 — End: 2022-06-09

## 2022-06-09 MED ORDER — levothyroxine (SYNTHROID) tablet 175 mcg
100 | Freq: Every day | ORAL
Start: 2022-06-09 — End: 2022-06-14
  Administered 2022-06-10 – 2022-06-13 (×4): 175 ug via ORAL

## 2022-06-09 MED FILL — THERA 400 MCG TABLET: 400 400 mcg | ORAL | Qty: 1

## 2022-06-09 MED FILL — PYRIDOSTIGMINE BROMIDE 60 MG TABLET: 60 60 mg | ORAL | Qty: 1

## 2022-06-09 MED FILL — ATORVASTATIN 40 MG TABLET: 40 40 MG | ORAL | Qty: 1

## 2022-06-09 MED FILL — BUPROPION HCL XL 300 MG 24 HR TABLET, EXTENDED RELEASE: 300 300 MG | ORAL | Qty: 1

## 2022-06-09 MED FILL — BACLOFEN 5 MG TABLET: 5 5 mg | ORAL | Qty: 2

## 2022-06-09 MED FILL — PANTOPRAZOLE 40 MG TABLET,DELAYED RELEASE: 40 40 MG | ORAL | Qty: 1

## 2022-06-09 MED FILL — MORPHINE ER 15 MG TABLET,EXTENDED RELEASE: 15 15 MG | ORAL | Qty: 1

## 2022-06-09 MED FILL — HEPARIN (PORCINE) 5,000 UNIT/ML INJECTION SOLUTION: 5000 5,000 unit/mL | INTRAMUSCULAR | Qty: 1

## 2022-06-09 MED FILL — TOPIRAMATE 50 MG TABLET: 50 50 MG | ORAL | Qty: 1

## 2022-06-09 MED FILL — HYDROCODONE 7.5 MG-ACETAMINOPHEN 325 MG TABLET: 7.5-325 7.5-325 mg | ORAL | Qty: 1

## 2022-06-09 MED FILL — OYSTER SHELL CALCIUM 500  500 MG CALCIUM (1,250 MG) TABLET: 500 500 mg calcium (1,250 mg) | ORAL | Qty: 1

## 2022-06-09 MED FILL — QUETIAPINE 12.5 MG DOSE: 12.5 12.5 MG | ORAL | Qty: 1

## 2022-06-09 MED FILL — MAGNESIUM OXIDE 400 MG (241.3 MG MAGNESIUM) TABLET: 400 400 mg | ORAL | Qty: 1

## 2022-06-09 MED FILL — LEVOTHYROXINE 75 MCG TABLET: 75 75 MCG | ORAL | Qty: 1

## 2022-06-09 MED FILL — CHOLECALCIFEROL (VITAMIN D3) 25 MCG (1,000 UNIT) TABLET: 1000 1000 units | ORAL | Qty: 1

## 2022-06-09 MED FILL — MONTELUKAST 10 MG TABLET: 10 10 mg | ORAL | Qty: 1

## 2022-06-09 MED FILL — CELLCEPT 500 MG TABLET: 500 500 mg | ORAL | Qty: 2

## 2022-06-09 MED FILL — ASPIRIN 81 MG TABLET,DELAYED RELEASE: 81 81 MG | ORAL | Qty: 1

## 2022-06-09 MED FILL — PAROXETINE 40 MG TABLET: 40 40 MG | ORAL | Qty: 1

## 2022-06-09 MED FILL — VITAMIN B-12  1,000 MCG TABLET: 1000 1000 MCG | ORAL | Qty: 1

## 2022-06-09 MED FILL — TAMSULOSIN 0.4 MG CAPSULE: 0.4 0.4 mg | ORAL | Qty: 1

## 2022-06-09 NOTE — Unmapped (Deleted)
CVICU Daily Progress Note    Patient: Andre Olson  MRN: [id number]  Room: 5409/W1191    Code Status:     Background / Hospital Course     Serenity Fortner is a 72 y.o. 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    Pt has been having worsening dyspnea the past month, with concurrent chest pain. Hence, pt arrived at [facility] today for his scheduled LHC/RHC. Dyspnea at rest, is not related with exertion. Chest pain occurs at rest, of pressure-like quality that radiates to left axilla. Chest pain intermittent, 3/10. Pt reports chest pain similar to that when he had an MI about a decade ago. Pt reports prior tobacco smoker ~50 pack-year hx, stopped smoking about 5 years ago. Drinks 3 beers/day for past 50 years.     Upon arrival at [facility] on 06/07/2022, pt was HDS/AF. BMP wnl, CBC wnl. ECG showed no e/o ischemia. ASA 325mg  load given.pt went to cath lab. LHC showed LAD: proximal (90%) and mid (80%) stenosis; D1: 80% ostial stenosis . RCA: mid (90%) stenosis. RHC was wnl, w/ low L-sided filling pressures, preserved cardiac output, and normal pulmonary pressures. Pt admitted floor level of care w/ CVICU as primary team for further CT surgery evaluation.    Interval History / Subjective     Significant events/labs/imaging over the past 24 hours:  - NAEON. Pt still having intermittent chest pain and dyspnea, both have been stable.    Brief Daily Plan:  - Vein Mapping studies pending  - Carotid Duplex US pending  - PFT's pending  - TTE pending  - Pending CT surgery recs, order CT Coronary vs. cMRI  - Replace Magnesium orally. IV MAGNESIUM CAN PRECIPITATE A MG CRISIS.      Assessment & Plan     Barry Faircloth is a 72 y.o. 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     Cardiovascular:    # CAD, severe two-vessel disease  CT Surgery consulted. CABG candidacy currently underway.  LHC showed LAD: proximal (90%) and mid (80%) stenosis; D1: 80% ostial stenosis . RCA: mid (90%) stenosis. RHC was wnl, w/ low L-sided filling pressures, preserved cardiac output, and normal pulmonary pressures.  - cardiac surgery, appreciate recs:   - not candidate for CABG, possible hybrid MICS-CABG + PCI vs. PCI only   - Vein Mapping   - carotid Duplex   - PFT's   - TTE   - CT coronary (vs. cMRI) for MICS-CABG eval  - Continue ASA 81mg , atorvastatin 40mg  daily.    Neurology, Pain, Sedation:   #chronic pain  - morphine 15mg  qhs.  - Norco 7.5 - 325 PO TID.  - baclofen 10mg  BID  - lidocaine patch.     #restless legs  - topiramate 50mg  BID.    # seronegativemyasthenia gravis  #autonomic neuropathy  - neurology consulted, appreciate recs   - obtain baseline respiratory parameters (NIF and FVC)  -  continue Mestinon 60mg  TID + Cellcept 1gm BID.  - AVOID if possible: IV Magnesium, beta blockers, curare and related drugs, aminoglycosides, macrolides, FQN, CCB,   quinine and procainamide  - would discuss neuromuscular blocking agents with anesthesia during pre-op evaluation    Pulmonary:      #COPD  - Continue home olodaterol, tiotropium daily  - continue home montelukast 10mg  qhs  - albuterol PRN.  - Flonase BID.    #hx of DVT/PE (2018, 2019)  - not on AC    Gastrointestinal:  #Diarrhea  -PRN Imodium    Renal (Electrolytes, Acid/Base):  - No acute issues.   - Monitor I/Os, renal panels, replete lytes PRN.     Genitourinary:  #BPH  #hx  Peyronie's disease  Pt reports being prescribed finasteride, but not taking at home.  - continue home tamsulosin 0.4mg  qhs.    Infectious Disease:   Antibiotics:  None    Cultures:  None    - No acute issues.    Endocrine:  # hx thyroid papillary cancer (s/p ablation)  # hypothyroidism  TSH 62.83 (elevated), T4 0.5 (low) on admission (1/5)  - continue home levothyroxine daily  - consider endocrine consult for preop management    Hematology/Oncology:  # hx thyroid papillary cancer (s/p ablation)  # hypothyroidism  TSH 62.83 (elevated), T4 0.5 (low) on admission (1/5)  - continue home levothyroxine daily  - consider endocrine consult for preop management    Rheumatology:  - No acute issues.    Musculoskeletal:  - No acute issues.    Psychiatry:  #MDD  #PTSD  - bupriopion 300mg  daily  - paroxetine 40mg  daily  - quetiapine 25mg  qhs -> decreased to 12.5mg  qhs    F/E/N:  - Repalce electrolytes PRN.  - Nutrition: Cardiac Diet  - SLP: N/A.  - Nutrition eval: N/A.    Diet/Nutrition Orders    Diet cardiac(low fat, salt, cholesterol)     Frequency: Effective Now     Number of Occurrences: Until Specified     Order Questions:      Suicide/Behavior Risk Modification? No     Invasive lines: None.  Patient Lines/Drains/Airways Status       Active Line / PIV Line       Name Placement date Placement time Site Days    Port A Cath  Left Chest --  --  Chest  --    Peripheral IV Left Forearm --  --  Forearm  --                  Foley: None.  Pressors: None  Sedation: None  Other Drips: None  Restraints: Non    DVT PPx: SubQ Heparin    GI PPx: PO Protonix    PT/OT: consulted.  PT Recs:    OT Recs:    SLP Recs:      Family discussion: ongoing.   Disposition: Floor status.    Vital Signs & Physical Exam     Vitals:    06/09/22 0457   BP: 95/58   Pulse: 63   Resp: 16   Temp: 97.7 F (36.5 C)   SpO2: 96%     Physical Exam  Constitutional:       General: He is not in acute distress.     Appearance: Normal appearance. He is not  ill-appearing.   Eyes:      General: No scleral icterus.     Extraocular Movements: Extraocular movements intact.  Cardiovascular:      Rate and Rhythm: Regular rhythm. Bradycardia present.      Pulses: Normal pulses.           Posterior tibial pulses are 2+ on the right side and 2+ on the left side.      Heart sounds: Murmur heard.      No friction rub. No gallop.   Pulmonary:      Effort: Pulmonary effort is normal.      Breath sounds: Normal breath sounds.   Abdominal:      General: Bowel sounds are normal.      Palpations: Abdomen is soft.   Musculoskeletal:      Right lower leg: No edema.      Left lower leg: No edema.   Skin:     General: Skin is warm and dry.      Capillary Refill: Capillary refill takes less than 2 seconds.   Neurological:      Mental Status: He is alert and oriented to person, place, and time.      Motor: Motor function is intact. No weakness.      Comments: Strength 4/5 in all ROM in bilateral LE.  Strength 3.5/5 in abduction, adduction, flexion and extension of bilateral UE.        CAM:    RASS:      Admit Wt: Weight: 159 lb 6.4 oz (72.3 kg)     Recent Results     All laboratory and imaging data reviewed.

## 2022-06-09 NOTE — Unmapped (Signed)
CVICU Daily Progress Note    Patient: Andre Olson  MRN: [id number]  Room: 5409/W1191    Code Status:     Background / Hospital Course     Joshuah Minella is a 72 y.o. 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    Pt has been having worsening dyspnea the past month, with concurrent chest pain. Hence, pt arrived at [facility] today for his scheduled LHC/RHC. Dyspnea at rest, is not related with exertion. Chest pain occurs at rest, of pressure-like quality that radiates to left axilla. Chest pain intermittent, 3/10. Pt reports chest pain similar to that when he had an MI about a decade ago. Pt reports prior tobacco smoker ~50 pack-year hx, stopped smoking about 5 years ago. Drinks 3 beers/day for past 50 years.     Upon arrival at [facility] on 06/07/2022, pt was HDS/AF. BMP wnl, CBC wnl. ECG showed no e/o ischemia. ASA 325mg  load given.pt went to cath lab. LHC showed LAD: proximal (90%) and mid (80%) stenosis; D1: 80% ostial stenosis . RCA: mid (90%) stenosis. RHC was wnl, w/ low L-sided filling pressures, preserved cardiac output, and normal pulmonary pressures. Pt admitted floor level of care w/ CVICU as primary team for further CT surgery evaluation.    Interval History / Subjective     Significant events/labs/imaging over the past 24 hours:  - NAEON. Pt still having intermittent chest pain and dyspnea, both have been stable.    Brief Daily Plan:  - endocrine consulted, appreciate recs  - Vein Mapping studies pending  - Carotid Duplex US pending  - PFT's pending  - TTE pending  - CT Coronary ordered  - Replace Magnesium orally. IV MAGNESIUM CAN PRECIPITATE A MG CRISIS.      Assessment & Plan      Patrick Salemi is a 72 y.o. 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     Cardiovascular:    # CAD, severe two-vessel disease  CT Surgery consulted. CABG candidacy currently underway.  LHC showed LAD: proximal (90%) and mid (80%) stenosis; D1: 80% ostial stenosis . RCA: mid (90%) stenosis. RHC was wnl, w/ low L-sided filling pressures, preserved cardiac output, and normal pulmonary pressures.  - cardiac surgery, appreciate recs:   - not candidate for CABG, possible hybrid MICS-CABG + PCI vs. PCI only   - Vein Mapping   - carotid Duplex   - PFT's   - TTE   - CT coronary for MICS-CABG eval  - Continue ASA 81mg , atorvastatin 40mg  daily.    Neurology, Pain, Sedation:   #chronic pain  - morphine 15mg  qhs.  - Norco 7.5 - 325 PO TID.  - baclofen 10mg  BID  - lidocaine patch     #restless legs  - topiramate 50mg  BID.    # seronegativemyasthenia gravis  #autonomic neuropathy  - neurology consulted, appreciate recs   - obtain baseline respiratory parameters (NIF and FVC)  - continue  Mestinon 60mg  TID + Cellcept 1gm BID.  - AVOID if possible: IV Magnesium, beta blockers, curare and related drugs, aminoglycosides, macrolides, FQN, CCB,   quinine and procainamide  - would discuss neuromuscular blocking agents with anesthesia during pre-op evaluation    Pulmonary:      #COPD  - Continue home olodaterol, tiotropium daily  - continue home montelukast 10mg  qhs  - albuterol PRN.  - Flonase BID.    #hx of DVT/PE (2018, 2019)  - not on AC    Gastrointestinal:  #Diarrhea  -PRN Imodium    Renal (Electrolytes, Acid/Base):  - No acute issues.   - Monitor I/Os, renal panels, replete lytes PRN.     Genitourinary:  #BPH  #hx  Peyronie's disease  Pt reports being prescribed finasteride, but not taking at home.  - continue home tamsulosin 0.4mg  qhs.    Infectious Disease:   Antibiotics:  None    Cultures:  None    - No acute issues.    Endocrine:  # hx thyroid papillary cancer (s/p ablation)  # hypothyroidism  TSH 62.83 (elevated), T4 0.5 (low) on admission (1/5)  - continue home levothyroxine 175mcg daily  - consulted endocrine, appreciate recs    Hematology/Oncology:  # hx thyroid papillary cancer (s/p ablation)  # hypothyroidism  TSH 62.83 (elevated), T4 0.5 (low) on admission (1/5)  - continue home levothyroxine 143mcg daily  - consulted endocrine, appreciate recs    Rheumatology:  - No acute issues.    Musculoskeletal:  - No acute issues.    Psychiatry:  #MDD  #PTSD  - bupriopion 300mg  daily  - paroxetine 40mg  daily  - quetiapine 25mg  qhs -> decreased to 12.5mg  qhs    F/E/N:  - Repalce electrolytes PRN.  - Nutrition: Cardiac Diet  - SLP: N/A.  - Nutrition eval: N/A.    Diet/Nutrition Orders    Diet cardiac(low fat, salt, cholesterol)     Frequency: Effective Now     Number of Occurrences: Until Specified     Order Questions:      Suicide/Behavior Risk Modification? No     Invasive lines: None.  Patient Lines/Drains/Airways Status       Active Line / PIV Line       Name Placement date Placement time Site Days    Port A Cath  Left Chest --  --  Chest  --    Peripheral IV Left Forearm --  --  Forearm  --                  Foley: None.  Pressors: None  Sedation: None  Other Drips: None  Restraints: Non    DVT PPx: SubQ Heparin    GI PPx: PO Protonix    PT/OT: consulted.  PT Recs: Recommendation: Defer at this time (due to [facility] on eval)  Equipment Recommended: Defer until further assessment  OT Recs:    SLP Recs:      Family discussion: ongoing.   Disposition: Floor status.    Vital Signs & Physical Exam     Vitals:    06/09/22 1000   BP:    Pulse: 66   Resp:    Temp:    SpO2: 97%     Physical Exam  Constitutional:       General: He is not  in acute distress.     Appearance: Normal appearance. He is not ill-appearing.   Eyes:      General: No scleral icterus.  Extraocular Movements: Extraocular movements intact.   Cardiovascular:      Rate and Rhythm: Regular rhythm. Bradycardia present.      Pulses: Normal pulses.           Posterior tibial pulses are 2+ on the right side and 2+ on the left side.      Heart sounds: Murmur heard.      No friction rub. No gallop.   Pulmonary:      Effort: Pulmonary effort is normal.      Breath sounds: Normal breath sounds.   Abdominal:      General: Bowel sounds are normal.      Palpations: Abdomen is soft.   Musculoskeletal:      Right lower leg: No edema.      Left lower leg: No edema.   Skin:     General: Skin is warm and dry.      Capillary Refill: Capillary refill takes less than 2 seconds.   Neurological:      Mental Status: He is alert and oriented to person, place, and time.      Motor: Motor function is intact. No weakness.      Comments: Strength 4/5 in all ROM in bilateral LE.  Strength 3.5/5 in abduction, adduction, flexion and extension of bilateral UE.        CAM:    RASS:      Admit Wt: Weight: 159 lb 6.4 oz (72.3 kg)     Recent Results     All laboratory and imaging data reviewed.

## 2022-06-09 NOTE — Unmapped (Signed)
Physical Therapy  Physical Therapy  Initial Assessment     Name: Andre Olson  DOB: 20-May-1951  Attending Physician: Collier Flowers, MD  Admission Diagnosis: Angina  Date: 06/09/2022  Room: 5284/X3244  Reviewed Pertinent hospital course: Yes    Hospital Course PT/OT: Pt 72 y.o male who presents for LHC/RHC 2/2 worsening dyspnea with concurrent chest pain. 1/5 RHC: WNL, preserved cardiac output; LHC: proximal + mid LAD stenosis, mid RCA stenosis, ostial 90% diagonal disease; Cardiac surgery consult with final recommendations pending. Neuro following for MG (sees pt at [facility])  Relevant PMH : myasthenia gravis (s/p IVIG), CAD (100% RCA occlusion, s/p thrombectomy and DES, 2013), autonomic neuropathy, and DVT/massive PE s/p EKOS (2018), recurrent PE (2019) (off AC), thyroid papillary cancer (s/p ablation), MDD, PTSD, COPD, and substance use disorder (alcohol)  Precautions: None  Activity Level: Activity as tolerated    Assist: None    Assessment  Assessment: Impaired Transfers, Impaired Gait, Impaired Sensation, Impaired Stair Negotiation, Impaired Activity Tolerance  Prognosis: Good  RN approved therapy session, pt pleasant and agreeable. Pt c/o dizziness while seated EOB requiring return to supine, after rest break pt was willing to try again. Pt performed bed mobility and partial STS with SBA, unable to progress further at this time due to symptomatic orthostatic hypotension. Rn was notified. Pt is independent at baseline and anticipate he will progress well. Pt would benefit from continued skilled PT to maximize safety and independence with functional mobility and facilitate return to prior level of function.    BP Supine: 115/62 mmHg  BP Seated: 95/63 mmHg    Recommendation  Recommendation: Defer at this time (due to +[place] on eval)  Equipment Recommended: Defer until further assessment         AM-PAC 6 Clicks Basic Mobility Inpatient Short Form: PT 6 Clicks Score: 20     Mobility Recommendations for Staff  Patient  ability: Patient transfers to chair/ bedside commode (watch BP/dizziness!)  Assist needed: with 1 person assist    Home Living/Prior Function  Patient able to provide accurate information at this time: Yes  Lives With: Spouse  Assistance available: 24 hour assistance  Type of Home: House  Home Entry: More than 1 step to enter;with railing  Stairs to enter: 4  Home Layout: Able to live on main level with bedroom/bathroom  Bathroom Shower/Tub: Medical sales representative: Standard  Home Equipment: Rolling Walker;Single-point cane  Prior Function  Functional Mobility: Independent ( no assistive device)  Receives Help From: None needed prior to admission  ADL Assistance: Independent  IADL Assistance: Independent  Vocation: Retired  Leisure Activities: builds things in his workshop  Therapy services currently receiving: None  Comments: reports difficulty ambulating long distances 2/2 COPD     Pain  Pain Score:  (does not rate)  Pain Location: Generalized  Pain Descriptors: Aching  Pain Intervention(s): Repositioned    Vision  Vision/Perception  Overall Vision/ Perception: Impaired  Impairments: Basic Vision;Perception  Current Vision: Wears glasses all the time (has prescription glasses that are also prism for diplopia due to MG)       Cognition  Overall Cognitive Status: Within Functional Limits  Cognitive Assessment: Arousal/ Alertness;Orientation Level;Following Commands;Safety Judgment;Insight;Behavior  Arousal/Alertness: Alert  Orientation Level: Oriented X4  Behavior: Appropriate  Following Commands: Follows all commands and directions without difficulty  Safety Judgment: Good awareness of safety precautions  Insight: Demonstrated intact insight into limitation and abilities to complete ADL's safely    Neuromuscular  Overall Sensation: Impaired  Additional Comments: N/T in hands and feet          Upper Extremity  UE Assessment: Impaired  Impairments: Strength  UE Strength: 4/5 proximal    Lower  Extremity  Lower Extremity  LE Assessment: WFL          Functional Mobility  Bed Mobility   Supine to Sit: Stand by assistance;head of bed elevated;towards the left  Sit to Supine: Stand by assistance;increased time to complete task;towards the left  Unable to progress further due to: performed Sup <> Sit 2 x, required return to supine due to dizziness then willing to attempt again during BP assessment, +[place]  Transfers  Sit to Stand: Stand by assistance (for partial STS while donning underwear)  Stand to Sit: Stand by assistance  Balance  Sitting - Static: Supervision  Sitting-Dynamic: Supervision       Gait belt used: Yes    Outcome Measures       Position after Therapy/Safety Handoff  Position after treatment and safety handoff  Position after therapy session: Bed  Details: RN notified;Call light/ needs within reach  Alarms: Bed  Alarms Status: Activated and Interfaced with call system    Goals  Collaborated with: Patient  Patient Stated Goal: to increase activity  Goals to be met by: 06/16/22  Patient will transition from supine to sit: Independent  Patient will transition from sit to supine: Independent  Patient will transfer from sit to stand: Modified Independent  Patient will transfer bed/chair: Will tolerate assessment  Patient will ambulate: Will tolerate assessment  Long-term goal to be met by: 06/23/22  Long Term Goal : Patient will tolerate stair assessment     Patient/Family Education  Educated patient on the role of physical therapy, goals, plan of care, importance of increased activity, and safety and need for supervision during OOB activity and fall prevention strategies, including need for supervision/ assistance with OOB activity and use of call light; patient verbalized understanding. Handout(s) issued: none.    Plan  Plan  Treatment/Interventions: Endurance training, LE strengthening/ROM, Patient/family training, Equipment eval/education, Gait training, Continued evaluation, Stair Training,  Neuromuscular Reeducation, Therapeutic Activity, Therapeutic Exercise  PT Frequency: minimum 3x/week    The plan of care and recommendations assesses the patient's and/or caregiver's readiness, willingness, and ability to provide or support functional mobility and ADL tasks as needed upon discharge.        Time  Start Time: 1045  Stop Time: 1105  Time Calculation (min): 20 min    Charges   $PT Evaluation Mod Complex 30 Min: 1 Procedure                    Problem List  Patient Active Problem List   Diagnosis    Stenosis, cervical spine    Syncope    Myasthenia gravis with exacerbation, adult form (CMS-HCC)      Past Medical History  Past Medical History:   Diagnosis Date    Benign prostate hyperplasia     Cancer (CMS-HCC)     COPD (chronic obstructive pulmonary disease) (CMS-HCC)     Coronary artery disease     Depression     GERD (gastroesophageal reflux disease)     Hypertension     Hypothyroid     Myasthenia gravis (CMS-HCC)     Orthostatic hypotension       Past Surgical History  Past Surgical History:   Procedure Laterality Date    TOTAL THYROIDECTOMY  March 1995  ulnar nerve release

## 2022-06-09 NOTE — Unmapped (Signed)
Occupational Therapy   Reason Patient Not Seen     Name: Andre Olson  DOB: 12-Jun-1950  Attending Physician: Woodfin Ganja, MD  Admission Diagnosis: Angina  Date: 06/09/2022  Precautions: Precautions: None  Reviewed Pertinent hospital course: Yes    Unable to see patient due to: Upon arrival, PT finishing evaluation and reporting pt is + orthostatic hypotension and pt just returned to supine. Will follow-up at a later date for initial OT evaluation.    Time Attempted: 1100

## 2022-06-09 NOTE — Unmapped (Signed)
Bedside NIF and FVC    NIF: -40     FVC: 2.9L

## 2022-06-10 ENCOUNTER — Inpatient Hospital Stay: Admit: 2022-06-10 | Discharge: 2022-11-15 | Payer: PRIVATE HEALTH INSURANCE

## 2022-06-10 ENCOUNTER — Inpatient Hospital Stay: Admit: 2022-06-10 | Discharge: 2022-11-19 | Payer: Medicare (Managed Care)

## 2022-06-10 ENCOUNTER — Inpatient Hospital Stay: Admit: 2022-06-10 | Payer: Medicare (Managed Care)

## 2022-06-10 ENCOUNTER — Inpatient Hospital Stay: Admit: 2022-06-10 | Discharge: 2022-11-15 | Payer: Medicare (Managed Care)

## 2022-06-10 ENCOUNTER — Inpatient Hospital Stay: Payer: PRIVATE HEALTH INSURANCE

## 2022-06-10 DIAGNOSIS — I25119 Atherosclerotic heart disease of native coronary artery with unspecified angina pectoris: Principal | ICD-10-CM

## 2022-06-10 LAB — CBC
Hematocrit: 37.7 % (ref 38.5–50.0)
Hemoglobin: 13.4 g/dL (ref 13.2–17.1)
MCH: 33.2 pg (ref 27.0–33.0)
MCHC: 35.6 g/dL (ref 32.0–36.0)
MCV: 93.1 fL (ref 80.0–100.0)
MPV: 7.5 fL (ref 7.5–11.5)
Platelets: 224 10*3/uL (ref 140–400)
RBC: 4.05 10*6/uL (ref 4.20–5.80)
RDW: 12.7 % (ref 11.0–15.0)
WBC: 6.7 10*3/uL (ref 3.8–10.8)

## 2022-06-10 LAB — RENAL FUNCTION PANEL W/EGFR
Albumin: 3.7 g/dL (ref 3.5–5.7)
Anion Gap: 6 mmol/L (ref 3–16)
BUN: 12 mg/dL (ref 7–25)
CO2: 27 mmol/L (ref 21–33)
Calcium: 8.7 mg/dL (ref 8.6–10.3)
Chloride: 107 mmol/L (ref 98–110)
Creatinine: 1.35 mg/dL (ref 0.60–1.30)
EGFR: 55
Glucose: 94 mg/dL (ref 70–100)
Osmolality, Calculated: 290 mOsm/kg (ref 278–305)
Phosphorus: 3.2 mg/dL (ref 2.1–4.5)
Potassium: 3.8 mmol/L (ref 3.5–5.3)
Sodium: 140 mmol/L (ref 133–146)

## 2022-06-10 LAB — T4, FREE: Free T4: 0.58 ng/dL (ref 0.61–1.76)

## 2022-06-10 LAB — MAGNESIUM: Magnesium: 1.8 mg/dL (ref 1.5–2.5)

## 2022-06-10 MED ORDER — propylene glycoL (SYSTANE) 0.6 % ophthalmic drops 1 drop
0.6 | Freq: Three times a day (TID) | OPHTHALMIC | Status: AC | PRN
Start: 2022-06-10 — End: 2022-06-14
  Administered 2022-06-11 – 2022-06-13 (×3): 1 [drp] via OPHTHALMIC

## 2022-06-10 MED FILL — MAGNESIUM OXIDE 400 MG (241.3 MG MAGNESIUM) TABLET: 400 400 mg | ORAL | Qty: 1

## 2022-06-10 MED FILL — HYDROCODONE 7.5 MG-ACETAMINOPHEN 325 MG TABLET: 7.5-325 7.5-325 mg | ORAL | Qty: 1

## 2022-06-10 MED FILL — HEPARIN (PORCINE) 5,000 UNIT/ML INJECTION SOLUTION: 5000 5,000 unit/mL | INTRAMUSCULAR | Qty: 1

## 2022-06-10 MED FILL — THERA 400 MCG TABLET: 400 400 mcg | ORAL | Qty: 1

## 2022-06-10 MED FILL — OYSTER SHELL CALCIUM 500  500 MG CALCIUM (1,250 MG) TABLET: 500 500 mg calcium (1,250 mg) | ORAL | Qty: 1

## 2022-06-10 MED FILL — TOPIRAMATE 50 MG TABLET: 50 50 MG | ORAL | Qty: 1

## 2022-06-10 MED FILL — LEVOTHYROXINE 75 MCG TABLET: 75 75 MCG | ORAL | Qty: 1

## 2022-06-10 MED FILL — BUPROPION HCL XL 300 MG 24 HR TABLET, EXTENDED RELEASE: 300 300 MG | ORAL | Qty: 1

## 2022-06-10 MED FILL — ASPIRIN 81 MG TABLET,DELAYED RELEASE: 81 81 MG | ORAL | Qty: 1

## 2022-06-10 MED FILL — ATORVASTATIN 40 MG TABLET: 40 40 MG | ORAL | Qty: 1

## 2022-06-10 MED FILL — MONTELUKAST 10 MG TABLET: 10 10 mg | ORAL | Qty: 1

## 2022-06-10 MED FILL — BACLOFEN 5 MG TABLET: 5 5 mg | ORAL | Qty: 2

## 2022-06-10 MED FILL — CELLCEPT 500 MG TABLET: 500 500 mg | ORAL | Qty: 2

## 2022-06-10 MED FILL — PAROXETINE 40 MG TABLET: 40 40 MG | ORAL | Qty: 1

## 2022-06-10 MED FILL — PYRIDOSTIGMINE BROMIDE 60 MG TABLET: 60 60 mg | ORAL | Qty: 1

## 2022-06-10 MED FILL — PANTOPRAZOLE 40 MG TABLET,DELAYED RELEASE: 40 40 MG | ORAL | Qty: 1

## 2022-06-10 MED FILL — QUETIAPINE 12.5 MG DOSE: 12.5 12.5 MG | ORAL | Qty: 1

## 2022-06-10 MED FILL — MORPHINE ER 15 MG TABLET,EXTENDED RELEASE: 15 15 MG | ORAL | Qty: 1

## 2022-06-10 MED FILL — CHOLECALCIFEROL (VITAMIN D3) 25 MCG (1,000 UNIT) TABLET: 1000 1000 units | ORAL | Qty: 1

## 2022-06-10 MED FILL — VITAMIN B-12  1,000 MCG TABLET: 1000 1000 MCG | ORAL | Qty: 1

## 2022-06-10 MED FILL — TAMSULOSIN 0.4 MG CAPSULE: 0.4 0.4 mg | ORAL | Qty: 1

## 2022-06-10 NOTE — Unmapped (Signed)
[facility]/Social Work Assessment      Patient Information     Patient Name: Andre Olson  MRN: [id number]  Hospital Day: 3  Inpatient/Observation: Inpatient  Admit Date: 06/07/2022  Admission Diagnosis: Angina  Attending provider: Woodfin Ganja, MD    PCP: Margaretha Glassing, MD  Home Pharmacy:              [facility]  [place] [address]  Phone: [phone]     [facility]  [place] [address]  Phone: [phone]               Issues related to obtaining medications: None    Payor Information   Medical Insurance Coverage:  Payor: HUMANA MANAGED MEDICARE / Plan: HUMANA GOLD PLUS MEDICARE / Product Type: Medicare Mngd Care /   Secondary Payor: N/A    Functional Assessment   Functional Assessment  Assessment Information Obtained From:: Patient  May We Obtain Collateral Information From Family, Friends and Neighbors?: Yes  Current Mental Status: Awake, Oriented to Situation, Oriented to Person, Oriented to Place, Oriented to Time  Mental Health History: No  Suicide Attempts: No  Activities of Daily Living: Independent  ADL Comments: has cane, walker, shower chair  Work History: Retired  Marital Status: Significant Other/Life Partner  Number of children and their names: Has 2 adult children, Daughter [name]  Relative Search Completed: No  Demographics Correct:: Yes    Current Living Arrangements   Current Living Arrangements  Current Living Arrangements: Home  Type of Housing: House  Who do you live with?: With Family  What family member?: [place]  One Story or Two (check all that apply): Multi-Level  Enter the number of steps and rails to enter the residence: 5  Enter the number of steps and rails inside the residence: 1 flight upstairs, patient states he stays on the second floor  History of Falls?: Yes  Frequency of Falls: every few months    [employer]  Was any abuse  reported by patient?: No    Support Systems   Emergency contact: Extended Emergency Contact Information  Primary Emergency Contact: [place] Phone: [phone]  Mobile Phone: [phone]  Relation: Significant other    Support Systems  Legal Status: HCPOA  Name of Guardian/POA/ Payee and Phone Number: Karle Plumber [phone]  Primary Caregiver: Self  Marital Status: Significant Other/Life Partner  Number of children and their names: Has 2 adult children, Daughter [name]  Relative Search Completed: No  Demographics Correct:: Yes  Expected Discharge Disposition: Home  Next of Kin: [name]  Next of Kin Relationship: Daughter  Next of Kin Phone Number: [phone]  Assessment Information Obtained From:: Patient    Other Pertinent Information     RN/CM assessed patient at bedside.  The patient stated he had brought a copy of his HCPOA, RN/CM placed hard copy in patient's chart.  Patient stated he had no current questions, concerns or needs related to discharge at this time.  Advance Directives (For Healthcare)  Advance Directive: Patient has advance directive, copy in chart  Type of Healthcare Directive: Durable power of attorney for health care  No Advanced Directive: Patient would NOT like information about advanced directives, forgoing or with-drawing life-sustaining treatment, and withholding resuscitative services  Healthcare Agent Appointed: No  Pre-existing DNR/DNI Order: No  Patient Requests Assistance: No    Discharge Plan     Met with patient to initiate discussion regarding discharge planning. Introduced self and role of case management/social work and provided Tour manager.       Anticipated Discharge Plan: Home    Anticipated Discharge Date: TBD    Anticipated Transportation: Family    Patient/Family aware and taking part in the discharge plan.  Patient/family educated that once post-acute care needs have been identified, a provider list  applicable to the identified post-acute care needs as well as the insurance provider will be provided, and patient/family have the freedom to choose their provider(s); financial interest(s) are disclosed as appropriate.    Nastassja Witkop [facility]  RN/Case Manager  Care Management Services  [phone]

## 2022-06-10 NOTE — Unmapped (Signed)
ANESTHESIOLOGY CONSULTATION AND PRE-OPERATIVE HISTORY AND PHYSICAL      Date of Surgery:  TBD    Surgeon:  Panza  Diagnosis:  CAD  Procedure:  MICS      Chief complaint: Andre Olson is a 72 y.o. year old male seen on 3 at the request of Dr. Lennart Pall to render an opinion on perioperative risk optimization and to coordinate medical care as necessary related to the above procedure.      History of Present Illness:  Andre Olson is an 72 y.o. male seen on hospital day 3. Pt admitted on 06/07/2022 with a primary diagnosis of CAD. He has a past medical history significant for well-controlled myasthenia gravis, CAD (with previous 100% RCA occlusion s/p thrombectomy and DES), massive PE s/p EKOS, thyroid papillary cancer s/p thyroidectomy, COPD, PTSD, MDD, prior alcohol abuse who presented to the [place] with CP, transferred to [facility] for RHC/LHC which showed 90% proximal LAD, 90% Diag, 80% RCA stenosis.  Will now undergo hybrid approach for revascularization (MIDCABG LAD and D1) with subsequent PCI of remaining vessels.       Past Medical History:  Past Medical History:   Diagnosis Date    Benign prostate hyperplasia     Cancer (CMS-HCC)     COPD (chronic obstructive pulmonary disease) (CMS-HCC)     Coronary artery disease     Depression     GERD (gastroesophageal reflux disease)     Hypertension     Hypothyroid     Myasthenia gravis (CMS-HCC)     Orthostatic hypotension        Past Surgical History:  Past Surgical History:   Procedure Laterality Date    LEFT AND RIGHT HEART CATHETERIZATION N/A 06/07/2022    Procedure: Left and Right Heart Cath;  Surgeon: Collier Flowers, MD;  Location: [facility];  Service: Cath;  Laterality: N/A;    TOTAL THYROIDECTOMY  March 1995    ulnar nerve release         Social History:  Social History     Socioeconomic History    Marital status: Single     Spouse name: Not on file    Number of children: Not on file    Years of education: Not on file    Highest education level: Not on file    Occupational History    Not on file   Tobacco Use    Smoking status: Former     Packs/day: 1.00     Years: 36.00     Additional pack years: 0.00     Total pack years: 36.00     Types: Cigarettes     Quit date: 02/02/2000     Years since quitting: 22.3    Smokeless tobacco: Never   Substance and Sexual Activity    Alcohol use: Yes     Alcohol/week: 12.0 - 14.0 standard drinks of alcohol     Types: 12 - 14 Cans of beer per week    Drug use: No    Sexual activity: Yes     Partners: Female   Other Topics Concern    Not on file   Social History Narrative    Not on file     Social Determinants of Health     Financial Resource Strain: Not on file   Food Insecurity: No Food Insecurity (06/08/2022)    Hunger Vital Sign     Worried About Running Out of Food in the Last Year: Never true  Ran Out of Food in the Last Year: Never true   Transportation Needs: No Transportation Needs (06/08/2022)    PRAPARE - Therapist, art (Medical): No     Lack of Transportation (Non-Medical): No   Physical Activity: Not on file   Stress: Not on file   Social Connections: Not on file   Intimate Partner Violence: Not At Risk (06/08/2022)    Humiliation, Afraid, Rape, and Kick questionnaire     Fear of Current or Ex-Partner: No     Emotionally Abused: No     Physically Abused: No     Sexually Abused: No   Housing Stability: Low Risk  (06/08/2022)    Housing Stability Vital Sign     Unable to Pay for Housing in the Last Year: No     Number of Places Lived in the Last Year: 1     Unstable Housing in the Last Year: No       Family History:  No family history on file.    Allergies:  Allergies   Allergen Reactions    Dilaudid  [Hydromorphone (Bulk)]      hallucinations    Lamotrigine Hives        Medications:  Scheduled Meds:   aspirin  81 mg Oral Daily with breakfast    atorvastatin  40 mg Oral Nightly (2100)    baclofen  10 mg Oral BID    buPROPion HCL  300 mg Oral Daily 0900    calcium carbonate 1250 mg tablet (500 mg elemental  calcium)  1,250 mg Oral BID    cholecalciferol (vitamin D3)  1,000 Units Oral Daily 0900    cyanocobalamin  1,000 mcg Oral Daily 0900    fluticasone propionate  1 spray Each Nare BID    heparin  5,000 Units Subcutaneous 3 times per day    HYDROcodone-acetaminophen  1 tablet Oral TID    levothyroxine  175 mcg Oral Daily 0900    lidocaine  2 patch Transdermal Daily 0900    magnesium oxide  400 mg Oral Daily 0900    montelukast  10 mg Oral Nightly (2100)    morphine SR  15 mg Oral Nightly (2100)    multivitamin with folic acid  1 tablet Oral Daily 0900    mycophenolate  1,000 mg Oral BID    olodateroL  2 puff Inhalation RT Daily    pantoprazole  40 mg Oral DAILY 0600    PARoxetine  40 mg Oral Daily 0900    pyridostigmine  60 mg Oral TID    QUEtiapine  12.5 mg Oral Nightly (2100)    tamsulosin  0.4 mg Oral Nightly (2100)    topiramate  50 mg Oral BID    umeclidinium  62.5 mcg Inhalation RT Daily     Continuous Infusions:  PRN Meds:.acetaminophen, albuterol, loperamide, ondansetron, propylene glycoL    Review of Systems - Review of Systems   Constitutional:  Negative for chills and fever.   HENT:  Negative for ear pain.    Eyes:  Negative for pain and redness.   Cardiovascular:  Positive for chest pain. Negative for leg swelling.   Gastrointestinal:  Positive for heartburn. Negative for nausea.   Genitourinary:  Negative for dysuria and hematuria.   Musculoskeletal:  Positive for neck pain.   Skin:  Negative for rash.   Neurological:  Negative for weakness.   Psychiatric/Behavioral:  The patient is not nervous/anxious.  Duke Activity Scale:  4 - Raking leaves; weeding or pushing a power mower.    Airway exam: MP III. Limited cervical ROM. Mouth opening >3 FB. TMD >3 FB.    IV access: L chest port, PIV in situ. Does report difficult IV access previously.    Physical Exam:  Temp:  [97.5 F (36.4 C)-98.1 F (36.7 C)] 97.9 F (36.6 C)  Heart Rate:  [59-73] 64  Resp:  [16-17] 17  BP: (89-111)/(56-67)  111/67    Physical Exam  Constitutional:       Appearance: Normal appearance.   Cardiovascular:      Rate and Rhythm: Normal rate and regular rhythm.      Pulses: Normal pulses.      Heart sounds: Normal heart sounds. No murmur heard.     No gallop.   Pulmonary:      Effort: Pulmonary effort is normal.   Musculoskeletal:      Right lower leg: No edema.      Left lower leg: No edema.   Neurological:      Mental Status: He is alert.            Labs:        Invalid input(s): CO2ART        Lab 06/07/22  1233   BNP 33       No results found for: ALT, AST, GGT, ALKPHOS, BILITOT    Lab Results   Component Value Date    WBC 6.7 06/10/2022    HGB 13.4 06/10/2022    HCT 37.7 (L) 06/10/2022    MCV 93.1 06/10/2022    PLT 224 06/10/2022       Studies:    TTE 06/10/21:    Study Conclusions     - Procedure narrative: Image quality was poor. The study was technically     limited due to poor acoustic window availability and body habitus.   - Left ventricle: The cavity size is normal. Wall thickness is normal.     Systolic function is normal. The estimated ejection fraction is 50-55%.     Although no diagnostic regional wall motion abnormality is identified,     this possibility cannot be completely excluded on the basis of this study.     Grade I diastolic dysfunction.   - Right ventricle: Systolic function is normal.   - Pulmonary arteries: Systolic pressure could not be accurately estimated.   - Inferior vena cava: The IVC is normal-sized. Respirophasic diameter     changes are in the normal range (> 50%), consistent with normal central     venous pressure.   Impressions:  No previous study was available for comparison.     RHC/LHC 06/07/21:    SUMMARY:     1. LAD: Proximal vessel stenosis: There is an 80%de novo stenosis.      Mid-vessel stenosis: There is a 60%de novo stenosis.   2. 1st diagonal: Ostial stenosis: There is an 80%de novo stenosis.   3. Right coronary: Mid-vessel stenosis: There is a 90%de novo stenosis.       Distal vessel stenosis: There is a 50%stenosis.       +-------------------------+-----------------------------------+   !Stage description        !Condition 1 - Fick                 !   +-------------------------+-----------------------------------+   !O2 uptake, hemoglobin    !Hgb: 13.83g/dl                     !   +-------------------------+-----------------------------------+   !  Cardiac output (Qs)      !6.4L/min                           !   +-------------------------+-----------------------------------+   !Cardiac index            !3.12L/(min-m^2)                    !   +-------------------------+-----------------------------------+   !HR, R-R, stroke volume   !55bpm,                       !   +-------------------------+-----------------------------------+   !RA pressure a/v (m)      !5/5 (5)                            !   +-------------------------+-----------------------------------+   !RV pressure s/d, ed      !20/4, 4                            !   +-------------------------+-----------------------------------+   !PA wedge a/v (m)         !7/7 (7)                            !   +-------------------------+-----------------------------------+   !MPA pressure s/d (m)     !18/7 (10)                          !   +-------------------------+-----------------------------------+   !LV pressure s/d, ed      !78/3, 4, dP/dt=96mm Hg/s          !   +-------------------------+-----------------------------------+   !Aortic pressure s/d (m)  !108/49 (73)                        !   +-------------------------+-----------------------------------+   !SVR                     !851dyn-sec/cm5                     !   +-------------------------+-----------------------------------+   !SVRI                    !1744dyn-sec-m^2/cm5                !   +-------------------------+-----------------------------------+   !PVR                     !38dyn-sec/cm5                      !    +-------------------------+-----------------------------------+   !PVRI                    !77dyn-sec-m^2/cm5                  !   +-------------------------+-----------------------------------+   !Total systemic resistance!913dyn-sec/cm5, 1872dyn-sec-m^2/cm5!   +-------------------------+-----------------------------------+   Saturations:     +------------------+-------------+   !Stage description !Condition 1 -!   +------------------+-------------+   !Saturation, RA    !75.8%        !   +------------------+-------------+   !Saturation, PA/MPA!73.8%        !   +------------------+-------------+   !  Saturation, aorta !95.1%        !   +------------------+-------------+       Cartoid Duplex 06/10/22:    Conclusions: Less than 50% stenosis bilateral ICA.Antegrade bilateral vertebral artery flow.       Assessment and Plan:    Cardiac Risk  Multivessel CAD  Worsening dyspnea  on exertion and intermittent CP over past several months. Prior to this was able to [facility] without issue. Workup significant for multivessel CAD as demonstrated on recent LHC. RHC wnl. Recent TTE limited but EF 50-55%. TTE at [facility] with inferior wall hypokinesis. CTS prefer minimal approach via MICS for revascularization given co morbidities (MG). Planning for MIDCABG LAD and D1, with PCI remaining diseased vessels following CABG.  -continue ASA, statin perioperatively  -not on BB, neurology recommending against this as it may cause acute MG exacerbation    Myasthenia Gravis  Well controlled on current regimen of pyridostigmine and cellcept, NOT on steroids. Previously received IVIG infusions through L chest port, has not received recently as his symptoms are well controlled. Neurology consulted this admission for perioperative risk of acute crisis. No intervention needed preoperatively (PLEX vs IVIG)  -please obtain NIF and FVC as per neurology, repeat PFTs  -higher risk for myasthenic crisis perioperatively given nature of surgery  -avoid triggers  perioperatively as listed below  -continue pyridostigmine, cellcept perioperatively    Hypothyroidism, s/p total thyroidectomy from thyroid papillary cancer  TSH 62.83, T4 0.5 on admission. On synthroid, but per patient has ran out of prescription for ~2 weeks.  -follow up endocrinology recs    Massive PE, previously on [facility]  Previously required EKOS 2018.     COPD  -PFTs pending  -continue current regimen of montelukast, striverdi, incruse ellipta, albuterol prn periop    AKI  Scr with upward trend, from 1.15 on admission to 1.35.   -continue to monitor daily renal panels    Chronic Pain  Cervical Stenosis  RLS  -continue baclofen, topomax, norco, MS Contin, tylenol prn periop  -recommend aggressive MMA intra-op and postoperatively    Mood Disorder  -continue paxil periop  -continue wellbutrin periop  -may continue seroquel periop    Recommendations:  ASA 4  GETA with OLV (DLT vs bronchial blocker)  Standard monitors + arterial line + MAC CVC + PAC + TEE + BIS   PIV x2-3  Limited cervical neck ROM from cervical stenosis -- recommend video laryngoscopy  L upper chest port for prior IVIG infusion, patient does report difficult IV access previously (does have a PIV in situ)    -Avoid MG triggers, as per neurology note, as follows: IV Magnesium, beta blockers, curare and related drugs, aminoglycosides, macrolides, FQN, CCB, quinine and procainamide   -Consider narcotic infusion (remi vs sufenta) as alternative to neuromuscular blockade  -Recommend aggressive multimodal analgesia regimen given high dose narcotic requirements at baseline   -No absolute contraindications to TEE or dysphagia, varices. Does report prior esophageal stretching (dilation?) in the past, unable to find any record on this in the chart.    I discussed the risks and benefits of general anesthesia and all necessary interventions/procedures to be done on the day of surgery. Patient is agreeable. I discussed that given his history of, although  well-controlled, MG that he is at risk for acute myasthenic crisis which could result in prolonged need for mechanical ventilation and possible respiratory complications as a result of this, especially given COPD diagnosis as well.     Elvina Mattes, DO  PGY3, Dept of Anesthesiology  [place] [facility] [place]

## 2022-06-10 NOTE — Unmapped (Shared)
House  Inpatient Discharge Summary     Patient: Andre Olson  Age: 72 y.o.    MRN: [id number]   CSN: [id number]    Date of Admission: 06/07/2022  Date of Discharge: 06/10/2022  Attending Physician: Collier Flowers, MD   Primary Care Physician: Marolyn Haller, MD     Diagnoses Present on Admission     Past Medical History:   Diagnosis Date    Benign prostate hyperplasia     Cancer (CMS-HCC)     COPD (chronic obstructive pulmonary disease) (CMS-HCC)     Coronary artery disease     Depression     GERD (gastroesophageal reflux disease)     Hypertension     Hypothyroid     Myasthenia gravis (CMS-HCC)     Orthostatic hypotension         Discharge Diagnoses     CAD, severe two-vessel disease    Operations/Procedures Performed (include dates)     Surgeries:  Surgical/Procedural Cases on this Admission       Case IDs Date Procedure Surgeon Location Status    [id number] 06/07/22 Left and Right Heart Cath Collier Flowers, MD [facility]            Lines and tubes:  Patient Lines/Drains/Airways Status       Active Line / PIV Line       Name Placement date Placement time Site Days    Port A Cath  Left Chest --  --  Chest  --    Peripheral IV Left Forearm --  --  Forearm  --                    Other Procedures / Pertinent Imaging:      Consulting Services (include reason)     CT surgery - CABG evaluation  Neurology - myasthenia gravis   Endocrinology - hypothyroidism    Allergies     Allergies   Allergen Reactions    Dilaudid  [Hydromorphone (Bulk)]      hallucinations    Lamotrigine Hives       Discharge Medications     {MEDICATION LIST FOR TRANSFERS AND DISCHARGES:22056}        Discharge Exam     Physical Exam      Reason for Admission     Scheduled LHC for chest pain and dyspnea    Hospital Course     # CAD, severe two-vessel disease  CT Surgery consulted. CABG candidacy currently underway.  LHC showed LAD: proximal (90%) and mid (80%) stenosis; D1: 80% ostial stenosis. RCA: mid (90%) stenosis. RHC was wnl, w/ low  L-sided filling pressures, preserved cardiac output, and normal pulmonary pressures.  - cardiac surgery, appreciate recs:              - not candidate for CABG, possible hybrid MICS-CABG + PCI vs. PCI only              - Vein Mapping              - carotid Duplex              - PFT's              - TTE              - CT coronary for MICS-CABG eval  - Continue ASA 81mg , atorvastatin 40mg  daily.    #chronic pain  - morphine 15mg  qhs.  -  Norco 7.5 - 325 PO TID.  - baclofen 10mg  BID  - lidocaine patch     #restless legs  - topiramate 50mg  BID.    # seronegativemyasthenia gravis  # autonomic neuropathy  - neurology consulted, appreciate recs              - obtain baseline respiratory parameters (NIF and FVC)  - continue Mestinon 60mg  TID + Cellcept 1gm BID.  - AVOID if possible: IV Magnesium, beta blockers, curare and related drugs, aminoglycosides, macrolides, FQN, CCB,   quinine and procainamide  - would discuss neuromuscular blocking agents with anesthesia during pre-op evaluation    #COPD  - Continue home olodaterol, tiotropium daily  - continue home montelukast 10mg  qhs  - albuterol PRN.  - Flonase BID.     #hx of DVT/PE (2018, 2019)  - not on [facility]    #BPH  #hx Peyronie's disease  Pt reports being prescribed finasteride, but not taking at home.  - continue home tamsulosin 0.4mg  qhs    # hx thyroid papillary cancer (s/p ablation)  # hypothyroidism  TSH 62.83 (elevated), T4 0.5 (low) on admission (1/5)  - continue home levothyroxine daily  - consulted endocrine, appreciate recs    #MDD  #PTSD  - bupriopion 300mg  daily  - paroxetine 40mg  daily  - quetiapine 25mg  qhs -> decreased to 12.5mg  qhs    Condition on Discharge     1. Functional Status: {functional capacity:14698::normal}   Describe limitations, if any: ***    2. Mental Status: {MENTAL STATUS:25207}   Describe limitations, if any: ***    3. Dietary Restrictions / Tube Feeding / TPN  Diet/Nutrition Orders    Diet cardiac(low fat, salt, cholesterol)      Frequency: Effective Now     Number of Occurrences: Until Specified     Order Questions:      Suicide/Behavior Risk Modification? No    Diet NPO past midnight Except for: except sips with meds CT coronary evaluation     Frequency: Effective _____     Number of Occurrences: Until Specified     Order Comments: CT coronary evaluation       Order Questions:      Except for except sips with meds     {DISCHARGE DIET:25340::As listed above}    4. Discharge specific orders:   {DISCHARGE SPECIFIC ORDERS:25348}    5. Core measures followed: (if this is a core measure patient)  Discharge Weight: 159 lb 6.4 oz (72.3 kg)          Is patient dx ACS? *** yes, type .acs or delete this    Disposition     {NSR DISPOSITION:25164}      Follow-Up Appointments     Future Appointments   Date Time Provider Department Center   06/12/2022 10:00 AM [facility]     No follow-up provider specified.    Signed:    Durward Mallard, MD  06/10/2022, 7:25 PM     ***Reminders (do not erase information below until ready to sign the note):  (1) Utilize the route function within Epic to deliver this summary to the primary care physician, Marolyn Haller, MD, and any other physician who will be involved in transitional care    (2) Update the actual date/time of this note so that it appears at the appropriate place in the medical record    (3) Refresh smart links before signing, but after running the discharge navigator  (particularly the  medication reconciliation list)

## 2022-06-10 NOTE — Unmapped (Signed)
Physical Therapy  Treatment    Name: Andre Olson  DOB: 31-Oct-1950  Attending Physician: Collier Flowers, MD  Admission Diagnosis: Angina  Date: 06/10/2022  Room: 2130/Q6578  Reviewed Pertinent hospital course: Yes    Hospital Course PT/OT: Pt 72 y.o male who presents for LHC/RHC 2/2 worsening dyspnea with concurrent chest pain. 1/5 RHC: WNL, preserved cardiac output; LHC: proximal + mid LAD stenosis, mid RCA stenosis, ostial 90% diagonal disease; Cardiac surgery consult with CABG recommendations pending following workup. Neuro following for MG (sees pt at [facility])  Relevant PMH : myasthenia gravis (s/p IVIG), CAD (100% RCA occlusion, s/p thrombectomy and DES, 2013), autonomic neuropathy, and DVT/massive PE s/p EKOS (2018), recurrent PE (2019) (off AC), thyroid papillary cancer (s/p ablation), MDD, PTSD, COPD, and substance use disorder (alcohol)  Precautions: None  Activity Level: Activity as tolerated    Assist: Co-treatment performed to integrate multiple skills simultaneously     Assessment  Patient was agreeable to participate in PT tx this afternoon and tolerated PT tx poorly.  Patient is limited by orthostatic hypotension.  Patient will benefit from continued inpatient PT in order to promote return to PLOF and safely increase independence during functional mobility and transfers.  PT will progress as tolerated and appropriate.    Assessment: Impaired Transfers;Impaired Gait;Impaired Balance;Impaired Strength;Impaired Activity Tolerance;Deconditioning  Co-treatment performed: to integrate multiple skills simultaneously in order to challenge patient and advance progress  Prognosis: Good    Recommendation  Recommendation: Home PT (pending further mobility)  Equipment Recommended: Patient already has needed DME    AM-PAC 6 Clicks Basic Mobility Inpatient Short Form: PT 6 Clicks Score: 19     Mobility Recommendations for Staff  Patient ability: Patient transfers to chair/ bedside commode (watch BP/dizziness!)  Assist  needed: with 1 person assist    Cognition  Overall Cognitive Status: Within Functional Limits  Arousal/Alertness: Alert  Orientation Level: Oriented X4  Behavior: Appropriate;Cooperative  Following Commands: Follows all commands and directions without difficulty  Safety Judgment: Good awareness of safety precautions  Insight: Demonstrated intact insight into limitation and abilities to complete ADL's safely     Pain  Pain Score: 4   Pain Location: Back  Pain Descriptors: Aching  Pain Intervention(s): Repositioned;Ambulation/increased activity  Therapist reported pain to: RN     Mobility  Bed Mobility  Supine to Sit: Modified independent;head of bed elevated;towards the left  Sit to Supine: Modified independent;head of bed elevated;towards the right  Transfers  Sit to Stand: Contact Guard assistance  Stand to Sit: Contact Guard assistance  Bed to Chair: Contact Guard assistance  Chair to Bed: Contact Guard assistance  Unable to progress further due to: orthostatics (see OT note for vitals)  Balance  Sitting - Static: Independent  Sitting - Dynamic: Supervision  Standing - Static: Contact Guard Assistance  Standing - Dynamic: Contact Guard Assistance    Gait belt used: Yes    Position after Treatment and Safety Handoff  Position after treatment and safety handoff  Position after therapy session: Bed  Details: RN notified;Call light/ needs within reach  Alarms: Bed  Alarms Status: Activated and Interfaced with call system    Goals  Goals Met: Bed to chair    Collaborated with: Patient  Patient Stated Goal: to increase activity  Goals to be met by: 06/16/22  Patient will transition from supine to sit: Independent  Patient will transition from sit to supine: Independent  Patient will transfer from sit to stand: Modified Independent  Patient will transfer  bed/chair: Supervision  Patient will ambulate: Will tolerate assessment  Long-term goal to be met by: 06/23/22  Long Term Goal : Patient will tolerate stair  assessment    Patient/Family Education  Educated patient on the role of physical therapy, goals, plan of care, importance of increased activity, discharge recommendations, and transfer training and fall prevention strategies, including need for supervision/ assistance with OOB activity and use of call light; patient verbalized understanding and demonstrated understanding.    Plan  Plan  Treatment/Interventions: Endurance training, LE strengthening/ROM, Patient/family training, Equipment eval/education, Gait training, Continued evaluation, Stair Training, Neuromuscular Reeducation, Therapeutic Activity, Therapeutic Exercise  PT Frequency: minimum 3x/week    The plan of care and recommendations assesses the patient's and/or caregiver's readiness, willingness, and ability to provide or support functional mobility and ADL tasks as needed upon discharge.    Time  Start Time: 1211  Stop Time: 1234  Time Calculation (min): 23 min    Charges  $Therapeutic Activity: 2 units    Problem List  Patient Active Problem List   Diagnosis    Stenosis, cervical spine    Syncope    Myasthenia gravis with exacerbation, adult form (CMS-HCC)        Past Medical History  Past Medical History:   Diagnosis Date    Benign prostate hyperplasia     Cancer (CMS-HCC)     COPD (chronic obstructive pulmonary disease) (CMS-HCC)     Coronary artery disease     Depression     GERD (gastroesophageal reflux disease)     Hypertension     Hypothyroid     Myasthenia gravis (CMS-HCC)     Orthostatic hypotension         Past Surgical History  Past Surgical History:   Procedure Laterality Date    LEFT AND RIGHT HEART CATHETERIZATION N/A 06/07/2022    Procedure: Left and Right Heart Cath;  Surgeon: Woodfin Ganja, MD;  Location: [facility];  Service: Cath;  Laterality: N/A;    TOTAL THYROIDECTOMY  March 1995    ulnar nerve release

## 2022-06-10 NOTE — Unmapped (Addendum)
Occupational Therapy  Initial Assessment     Name: Andre Olson  DOB: 02-Mar-1951  Attending Physician: Collier Flowers, MD  Admission Diagnosis: Angina  Date: 06/10/2022  Room: 0981/X9147  Reviewed Pertinent hospital course: Yes    Hospital Course PT/OT: Pt 72 y.o male who presents for LHC/RHC 2/2 worsening dyspnea with concurrent chest pain. 1/5 RHC: WNL, preserved cardiac output; LHC: proximal + mid LAD stenosis, mid RCA stenosis, ostial 90% diagonal disease; Cardiac surgery consult with CABG recommendations pending following workup. Neuro following for MG (sees pt at [facility])  Relevant PMH : myasthenia gravis (s/p IVIG), CAD (100% RCA occlusion, s/p thrombectomy and DES, 2013), autonomic neuropathy, and DVT/massive PE s/p EKOS (2018), recurrent PE (2019) (off AC), thyroid papillary cancer (s/p ablation), MDD, PTSD, COPD, and substance use disorder (alcohol)  Precautions: None  Activity Level: Activity as tolerated    Assist: Co-treatment performed to integrate multiple skills simultaneously       Recommendation  Recommendation: Anticipate no further OT needed after discharge  Equipment Recommendations: None      Assessment  Assessment: Decreased self-care transfers, Decreased IADLs, Decreased activity tolerance, Decreased ADL status, Decreased Functional Mobility, Decreased Balance         Prognosis for OT goals: Good                      Pt presents supine in bed. Agreeable and motivated for evaluation. Prior to ambulating pt has c/o of light headedness that worsens with orthostatic vital measurement positional changes. Following standing EOB for BP and assessment with CGA for safety pt request to lay down. BP rises once spending time in supine and pt expresses need to have a BM. [facility] with stand pivot transfer and CGA. Once pt finished on [facility] assisted back to bed with safety precautions in place and all needs in reach.     BP vitals:   Supine: 102/67 (80)  Sitting: 86/62 (71)  Standing: 61/45  (57)  Returned to supine: 118/67 (81)    RN notified of orthostatic vitals.     Outcome Measures  AM-PAC 6 Clicks Daily Activity Inpatient Short Form: OT 6 Clicks Score: 18    Home Living/Prior Function  Patient able to provide accurate information at this time: Yes  Lives With: Spouse  Assistance available: 24 hour assistance  Type of Home: House  Home Entry: More than 1 step to enter, with railing  Stairs to enter: 4  Home Layout: Able to live on main level with bedroom/bathroom  Bathroom Shower/Tub: Medical sales representative: Standard  Home Equipment: Agricultural consultant, Single-point cane    Prior Function  Functional Mobility: Independent ( no assistive device)  Receives Help From: None needed prior to admission  ADL Assistance: Independent  IADL Assistance: Independent  Vocation: Retired  Leisure Activities: builds things in his workshop  Therapy services currently receiving: None  Comments: reports difficulty ambulating long distances 2/2 COPD     Pain  Pain Score: 4   Pain Location: Back  Pain Descriptors: Aching  Pain Intervention(s): Repositioned;Ambulation/increased activity  Therapist reported pain to: RN    Cognition  Overall Cognitive Status: Within Functional Limits  Cognitive Assessment: Arousal/ Alertness;Orientation Level;Behavior;Following Commands;Safety Judgment;Insight;Memory;Communication  Arousal/Alertness: Alert  Orientation Level: Oriented X4  Behavior: Appropriate;Cooperative  Following Commands: Follows multistep commands  Safety Judgment: Good awareness of safety precautions  Insight: Demonstrated intact insight into limitation and abilities to complete ADL's safely    Vision  Overall  Vision/ Perception: Within Functional Limits       Right Upper Extremity   Right UE ROM: Grossly WFL as observed during functional activities  Right UE Strength: Grossly WFL (at least 3+/5) as observed during functional activities  Right Hand Function: Grossly WFL as observed during functional  activity         Left Upper Extremity  Left UE ROM: Grossly WFL as observed during functional activities  Left UE Strength: Grossly WFL (at least 3+/5) as observed during functional activities  Left UE Hand Function: Grossly WFL as observed during functional activites         Neuromuscular  Overall Sensation: Impaired  Impairments: Light touch  Light Touch: RLE partial deficits;LLE partial deficits  Additional Comments: Secondary to neuropathy          Functional Mobility  Bed Mobility   Supine to Sit: Stand by assistance;head of bed elevated;use of handrail  Sit to Supine: Stand by assistance;head of bed elevated;use of handrail  Transfers  Sit to Stand: Contact guard assistance  Stand to Sit: Contact guard assistance  Toilet Transfers: Contact guard assistance;BSC  Unable to progress further due to: safety concerns secondary to orthostatic hypostension.  Balance  Sitting - Static: Supervision  Sitting-Dynamic: Supervision   Standing-Static: Contact Guard Assistance  Standing-Dynamic: Contact Guard Assistance    Gait belt used: Yes    ADL  Toileting: Contact guard assistance  Toileting Deficit: Clothing management down;Clothing management up  Location Assessed Toileting: BSC         Position after Treatment/Safety Handoff  Position after therapy session: Bed  Details: RN notified;Call light/ needs within reach  Alarms: Bed  Alarms Status: Not needed-patient on Universal fall risk precautions with other interventions in place    Plan  Plan  Treatment Interventions: ADL retraining, Activity Tolerance training, Energy Conservation, IADL retraining, Equipment eval/education, Patient/Family training, Functional transfer training, Therapeutic Activity, Compensatory technique education  OT Frequency: minimum 3x/week    The plan of care and recommendations assesses the patient's and/or caregiver's readiness, willingness, and ability to provide or support functional mobility and ADL tasks as needed upon discharge.    Goals    Goals to be met in: 1 week  Patient stated goal: to go home, to increase activity, to increase independence, to return to baseline/PLOF  Patient will complete supine to sit in prep for ADLs: Modified Independent (from flat bed)  Patient will complete toilet transfer: Modified Independent  Patient will complete toileting: Modified Independent  Patient will complete lower body dressing: Modified Independent  Patient will complete lower body bathing : Modified Independent  Long Term Goal : Pt will demonstrate community distance mobility with SBA and AE as needed.  Long term goal to be met in: 2 weeks  Collaborated with: Patient    Patient/Family Education  Educated patient on the role of occupational therapy, OT goals, OT plan of care, discharge recommendation, ADL training, functional mobility training, and the importance of safety and fall prevention strategies including need for supervision/ assistance with OOB activity and use of call light. patient  verbalized understanding and demonstrated understanding.    OT Time  Start Time: 1213  Stop Time: 1234  Time Calculation (min): 21 min    OT Charges  $OT Evaluation Mod Complex 45 Min: 1 Procedure              Problem List  Patient Active Problem List   Diagnosis    Stenosis, cervical spine    Syncope  Myasthenia gravis with exacerbation, adult form (CMS-HCC)      Past Medical History  Past Medical History:   Diagnosis Date    Benign prostate hyperplasia     Cancer (CMS-HCC)     COPD (chronic obstructive pulmonary disease) (CMS-HCC)     Coronary artery disease     Depression     GERD (gastroesophageal reflux disease)     Hypertension     Hypothyroid     Myasthenia gravis (CMS-HCC)     Orthostatic hypotension      Past Surgical History  Past Surgical History:   Procedure Laterality Date    LEFT AND RIGHT HEART CATHETERIZATION N/A 06/07/2022    Procedure: Left and Right Heart Cath;  Surgeon: Collier Flowers, MD;  Location: [facility];  Service: Cath;  Laterality:  N/A;    TOTAL THYROIDECTOMY  March 1995    ulnar nerve release

## 2022-06-10 NOTE — Unmapped (Signed)
CVICU Daily Progress Note    Patient: Andre Olson  MRN: [id number]  Room: 7829/F6213    Code Status:     Background / Hospital Course     Andre Olson is a 72 y.o. 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    Pt has been having worsening dyspnea the past month, with concurrent chest pain. Hence, pt arrived at [facility] today for his scheduled LHC/RHC. Dyspnea at rest, is not related with exertion. Chest pain occurs at rest, of pressure-like quality that radiates to left axilla. Chest pain intermittent, 3/10. Pt reports chest pain similar to that when he had an MI about a decade ago. Pt reports prior tobacco smoker ~50 pack-year hx, stopped smoking about 5 years ago. Drinks 3 beers/day for past 50 years.     Upon arrival at [facility] on 06/07/2022, pt was HDS/AF. BMP wnl, CBC wnl. ECG showed no e/o ischemia. ASA 325mg  load given.pt went to cath lab. LHC showed LAD: proximal (90%) and mid (80%) stenosis; D1: 80% ostial stenosis . RCA: mid (90%) stenosis. RHC was wnl, w/ low L-sided filling pressures, preserved cardiac output, and normal pulmonary pressures. Pt admitted floor level of care w/ CVICU as primary team for further CT surgery evaluation.    Interval History / Subjective     Significant events/labs/imaging over the past 24 hours:  - NAEON. Pt still having intermittent chest pain and dyspnea, both have been stable.    Brief Daily Plan:  - endocrine consulted, appreciate recs  - Vein Mapping studies pending  - Carotid Duplex US pending  - PFT's pending  - TTE pending  - CT Coronary pending  - AVOID if possible (myasthenia gravis):  - IV Magnesium, beta blockers, curare and related drugs,  aminoglycosides, macrolides, FQN, CCB, quinine and procainamide.  - Replace Magnesium orally. IV MAGNESIUM CAN PRECIPITATE A MG CRISIS.      Assessment & Plan     Andre Olson is a 72 y.o. 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     Cardiovascular:    # CAD, severe two-vessel disease  CT Surgery consulted. CABG candidacy currently underway.  LHC showed LAD: proximal (90%) and mid (80%) stenosis; D1: 80% ostial stenosis . RCA: mid (90%) stenosis. RHC was wnl, w/ low L-sided filling pressures, preserved cardiac output, and normal pulmonary pressures.  - cardiac surgery, appreciate recs:   - not candidate for CABG, possible hybrid MICS-CABG + PCI vs. PCI only   - Vein Mapping   - carotid Duplex   - PFT's   - TTE   - CT coronary for MICS-CABG eval  - Continue ASA 81mg , atorvastatin 40mg  daily.    Neurology, Pain, Sedation:   #chronic pain  - morphine 15mg  qhs.  - Norco 7.5 - 325 PO TID.  - baclofen 10mg  BID  - lidocaine patch     #restless legs  - topiramate 50mg  BID.    #  seronegativemyasthenia gravis  #autonomic neuropathy  - neurology consulted, appreciate recs   - obtain baseline respiratory parameters (NIF and FVC)  - continue Mestinon 60mg  TID + Cellcept 1gm BID.  - AVOID if possible: IV Magnesium, beta blockers, curare and related drugs, aminoglycosides, macrolides, FQN, CCB,   quinine and procainamide  - would discuss neuromuscular blocking agents with anesthesia during pre-op evaluation    Pulmonary:      #COPD  - Continue home olodaterol, tiotropium daily  - continue home montelukast 10mg  qhs  - albuterol PRN.  - Flonase BID.    #hx of DVT/PE (2018, 2019)  - not on  AC    Gastrointestinal:  #Diarrhea  -PRN Imodium    Renal (Electrolytes, Acid/Base):  - No acute issues.   - Monitor I/Os, renal panels, replete lytes PRN.     Genitourinary:  #BPH  #hx Peyronie's disease  Pt reports being prescribed finasteride, but not taking at home.  - continue home tamsulosin 0.4mg  qhs.    Infectious Disease:   Antibiotics:  None    Cultures:  None    - No acute issues.    Endocrine:  # hx thyroid papillary cancer (s/p ablation)  # hypothyroidism  TSH 62.83 (elevated), T4 0.5 (low) on admission (1/5)  - continue home levothyroxine daily  - consulted endocrine, appreciate recs    Hematology/Oncology:  # hx thyroid papillary cancer (s/p ablation)  # hypothyroidism  TSH 62.83 (elevated), T4 0.5 (low) on admission (1/5)  - continue home levothyroxine daily  - consulted endocrine, appreciate recs    Rheumatology:  - No acute issues.    Musculoskeletal:  - No acute issues.    Psychiatry:  #MDD  #PTSD  - bupriopion 300mg  daily  - paroxetine 40mg  daily  - quetiapine 25mg  qhs -> decreased to 12.5mg  qhs    F/E/N:  - Repalce electrolytes PRN.  - Nutrition: Cardiac Diet  - SLP: N/A.  - Nutrition eval: N/A.    Diet/Nutrition Orders    Diet cardiac(low fat, salt, cholesterol)     Frequency: Effective Now     Number of Occurrences: Until Specified     Order Questions:      Suicide/Behavior Risk Modification? No     Invasive lines: None.  Patient Lines/Drains/Airways Status       Active Line / PIV Line       Name Placement date Placement time Site Days    Port A Cath  Left Chest --  --  Chest  --    Peripheral IV Left Forearm --  --  Forearm  --                  Foley: None.  Pressors: None  Sedation: None  Other Drips: None  Restraints: Non    DVT PPx: SubQ Heparin    GI PPx: PO Protonix    PT/OT: consulted.  PT Recs: Recommendation: Defer at this time (due to [facility] on eval)  Equipment Recommended: Defer until further assessment  OT Recs:    SLP Recs:      Family discussion: ongoing.    Disposition: Floor status.    Vital Signs & Physical Exam     Vitals:    06/10/22 1200   BP:    Pulse: 59   Resp:    Temp:    SpO2: 96%     Physical Exam  Constitutional:       General: He is not in acute distress.  Appearance: Normal appearance. He is not ill-appearing.   Eyes:      General: No scleral icterus.     Extraocular Movements: Extraocular movements intact.   Cardiovascular:      Rate and Rhythm: Regular rhythm. Bradycardia present.      Pulses: Normal pulses.           Posterior tibial pulses are 2+ on the right side and 2+ on the left side.      Heart sounds: Murmur heard.      No friction rub. No gallop.   Pulmonary:      Effort: Pulmonary effort is normal.      Breath sounds: Normal breath sounds.   Abdominal:      General: Bowel sounds are normal.      Palpations: Abdomen is soft.   Musculoskeletal:      Right lower leg: No edema.      Left lower leg: No edema.   Skin:     General: Skin is warm and dry.      Capillary Refill: Capillary refill takes less than 2 seconds.   Neurological:      Mental Status: He is alert and oriented to person, place, and time.      Motor: Motor function is intact. No weakness.      Comments: Strength 4/5 in all ROM in bilateral LE.  Strength 3.5/5 in abduction, adduction, flexion and extension of bilateral UE.        CAM:    RASS:      Admit Wt: Weight: 159 lb 6.4 oz (72.3 kg)     Recent Results     All laboratory and imaging data reviewed.

## 2022-06-10 NOTE — Unmapped (Signed)
Endocrine Consultation Note    Date of admission: 06/07/2022  Date/Time of Consult: 06/10/2022, 1:25 PM  Attending: Woodfin Ganja, MD  Consultant attending: Dr Posey Pronto    Reason for consult: hypothyroidism, preop optimization     HPI:   Andre Olson is a 72 y.o. male who is referred to endocrine service for evaluation of hypothyroidism in the setting of pre-op planning. 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 Was found to have multivessel disease, is planned for cardiac surgery evaluation.     He is prescribed LT4 175 mcg daily. Reports not taking due to running out and having issues getting it filled by PCP. Follows at the [place], follows with [facility], last seen 10/22. 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  He had radioactive iodine treatment in 1995, he had recent scan in 2018 which was negative, last thyroid ultrasound in May 2019 was negative, thyroglobulin/thyroglobulin antibody were negative in July 2019.     Last TFTs in [place] system 05/08/22 TSH 24.6, Ft4 1.6    TFTs this admission: TSH 62 with FT4 0.5 now improved to 0.58. Was started on his home LT4 dose of 175 mcg daily.       History    Past Medical History:   Diagnosis Date    Benign prostate hyperplasia     Cancer (CMS-HCC)     COPD (chronic obstructive pulmonary disease) (CMS-HCC)     Coronary artery disease     Depression     GERD (gastroesophageal reflux disease)     Hypertension     Hypothyroid     Myasthenia gravis (CMS-HCC)     Orthostatic hypotension      Past Surgical History:   Procedure Laterality Date    LEFT AND RIGHT HEART CATHETERIZATION N/A 06/07/2022    Procedure: Left and Right Heart Cath;  Surgeon: Woodfin Ganja, MD;  Location:  [facility];  Service: Cath;  Laterality: N/A;    TOTAL THYROIDECTOMY  March 1995    ulnar nerve release       No family history on file.  Social History     Tobacco Use    Smoking status: Former     Packs/day: 1.00     Years: 36.00     Additional pack years: 0.00     Total pack years: 36.00     Types: Cigarettes     Quit date: 02/02/2000     Years since quitting: 22.3    Smokeless tobacco: Never   Substance Use Topics    Alcohol use: Yes     Alcohol/week: 12.0 - 14.0 standard drinks of alcohol     Types: 12 - 14 Cans of beer per week    Drug use: No         Allergies   Allergen Reactions    Dilaudid  [Hydromorphone (Bulk)]      hallucinations    Lamotrigine Hives       Current Facility-Administered Medications   Medication Dose Route Frequency Provider Last Rate Last Admin    acetaminophen (TYLENOL) tablet 650 mg  650 mg Oral Q4H PRN Zachary Ahart, DO        albuterol (PROVENTIL) 90 mcg/actuation inhaler 2 puff  2 puff Inhalation RT Q4H PRN Durward Mallard, MD        aspirin EC tablet 81 mg  81 mg Oral Daily with breakfast Durward Mallard, MD   81 mg at 06/10/22 0949    atorvastatin (LIPITOR) tablet 40 mg  40 mg Oral Nightly (2100) Durward Mallard, MD   40 mg at 06/09/22 2111    baclofen (LIORESAL) tablet 10 mg  10 mg Oral BID Durward Mallard, MD   10 mg at 06/10/22 0949    buPROPion HCL (WELLBUTRIN XL) 24 hr tablet 300 mg  300 mg Oral Daily 0900 Durward Mallard, MD   300 mg at 06/10/22 0950    calcium carbonate (OS-CAL) tablet 1250 mg (500 mg elemental calcium)  1,250 mg Oral BID Durward Mallard, MD   1,250 mg at 06/10/22 0950    cholecalciferol (vitamin D3) tablet 1,000 Units  1,000 Units Oral Daily 0900 Durward Mallard, MD   1,000 Units at 06/10/22 0950    cyanocobalamin (VITAMIN B-12) tablet 1,000 mcg  1,000 mcg Oral Daily 0900 Durward Mallard, MD   1,000 mcg at 06/10/22 0949    fluticasone propionate (FLONASE) 50 mcg/actuation nasal spray 1 spray  1 spray Each Nare BID Durward Mallard, MD   1 spray at 06/10/22 9604    heparin (porcine)  injection 5,000 Units  5,000 Units Subcutaneous 3 times per day Kennith Center, MD   5,000 Units at 06/07/22 2157    HYDROcodone-acetaminophen (NORCO) 7.5-325 mg per tablet 1 tablet  1 tablet Oral TID Durward Mallard, MD   1 tablet at 06/10/22 0949    levothyroxine (SYNTHROID) tablet 175 mcg  175 mcg Oral Daily 0900 Durward Mallard, MD   175 mcg at 06/10/22 0452    lidocaine (LIDODERM) 5 % 2 patch  2 patch Transdermal Daily 0900 Durward Mallard, MD   1 patch at 06/08/22 1155    loperamide (IMODIUM) capsule 2 mg  2 mg Oral 4x Daily PRN Jamie Brookes, MD        magnesium oxide (MAG-OX) tablet 400 mg  400 mg Oral Daily 0900 Wendi Snipes, DO   400 mg at 06/10/22 0950    montelukast (SINGULAIR) tablet 10 mg  10 mg Oral Nightly (2100) Durward Mallard, MD   10 mg at 06/09/22 2112    morphine SR (MS CONTIN) 12 hr tablet 15 mg  15 mg Oral Nightly (2100) Durward Mallard, MD   15 mg at 06/09/22 2247    multivitamin with folic acid tablet 1 tablet  1 tablet Oral Daily 0900 Durward Mallard, MD   1 tablet at 06/10/22 0950    mycophenolate (CELLCEPT) 500 mg tablet 1,000 mg  1,000 mg Oral BID Durward Mallard, MD   1,000 mg at 06/10/22 0950    olodateroL (STRIVERDI) 2.5 mcg/actuation inhaler 2 puff  2 puff Inhalation RT Daily Durward Mallard, MD   2 puff at 06/10/22 1023    ondansetron (ZOFRAN) tablet 4 mg  4 mg Oral Q6H PRN Durward Mallard, MD        pantoprazole (PROTONIX) EC tablet 40 mg  40 mg Oral DAILY 0600 Durward Mallard, MD   40 mg at 06/10/22 0452    PARoxetine (PAXIL) tablet 40 mg  40 mg Oral Daily 0900 Durward Mallard, MD   40 mg at 06/10/22 0951    propylene glycoL 0.6 % Drop 1 drop  1 drop Both Eyes Q8H PRN Collier Flowers, MD   1 drop at 06/10/22 0951    pyridostigmine (MESTINON)  tablet 60 mg  60 mg Oral TID Durward Mallard, MD   60 mg at 06/10/22 0951    QUEtiapine (SEROQUEL) half tablet 12.5 mg  12.5 mg Oral Nightly (2100) Jamie Brookes, MD   12.5 mg at 06/09/22 2247    tamsulosin (FLOMAX) capsule 0.4 mg  0.4 mg Oral Nightly (2100) Durward Mallard, MD   0.4 mg at 06/09/22  2112    topiramate (TOPAMAX) tablet 50 mg  50 mg Oral BID Durward Mallard, MD   50 mg at 06/10/22 0950    umeclidinium (INCRUSE ELLIPTA) 62.5 mcg/actuation powder for inhalation DsDv 62.5 mcg  62.5 mcg Inhalation RT Daily Durward Mallard, MD   62.5 mcg at 06/10/22 1022       Physical Exam    BP 111/63 (BP Location: Left upper arm, Patient Position: Lying)   Pulse 71   Temp 98.1 F (36.7 C) (Oral)   Resp 16   Ht 6' 8 (2.032 m)   Wt 159 lb 6.4 oz (72.3 kg)   SpO2 95%   BMI 17.51 kg/m   Physical Exam  Physical Exam:  General: NAD, well appearing  HEENT: eomi, non-icteric sclera  Resp: no increased WOB  Neuro: NFD, grossly normal  Psych: normal mood and affect         CBC:    Lab Results   Component Value Date    HGB 13.4 06/10/2022    HCT 37.7 (L) 06/10/2022    PLT 224 06/10/2022    WBC 6.7 06/10/2022    MCV 93.1 06/10/2022       RENAL:    Lab Results   Component Value Date    NA 140 06/10/2022    K 3.8 06/10/2022    CL 107 06/10/2022    CO2 27 06/10/2022    BUN 12 06/10/2022    CREATININE 1.35 (H) 06/10/2022    GLUCOSE 94 06/10/2022    PHOS 3.2 06/10/2022    ALBUMIN 3.7 06/10/2022    CALCIUM 8.7 06/10/2022       THYROID:    Lab Results   Component Value Date    GLUCOSE 94 06/10/2022    ALBUMIN 3.7 06/10/2022    TSH 62.83 (H) 06/07/2022    FREET4 0.58 (L) 06/10/2022       No results found for: HGBA1C    Assessment and Plan:  73 y.o. male who is referred to endocrine service for evaluation of hypothyroidism in the setting of pre-op planning. 72 yo M with a PMH of MG, CAD, HTN, post surgical hypothyroidism (Hx of remote follicular thyroid cancer) with a hx of levothyroxine non adherence presented for [facility] 06/07/22 due to chest pain. Was found to have multivessel disease, is planned for cardiac surgery evaluation.     #uncontrolled hypothyroidism due to Lt4 non adherence  #postsurgical hypothyroidism, hx of follicular thyroid cancer-remote  -continue home dose PO Lt4 175 mcg daily  -As discussed with the primary team  today, we recommend patient to be euthyroid prior to undergoing cardiac surgery but the current uncontrolled hypothyroidism is not a contraindication for surgery.   -if non emergent, would recommend treatment until hypothyroidism controlled prior to OR  -if emergent or needs to be done sooner than this, would recommend T3 dosing post operatively  -Discussed with primary team. Please reach out to endocrine when the plan for intervention has been established as it seems the decision regarding the type of procedure is being discussed at this time (Obtain Ft4 and total T3 at  that time)    Follow up plan:  [facility] Chryl Heck    The patient was seen and discussed with attending Dr. Allena Katz who concurs.     Lanell Matar, MD   Endocrine Fellow   Endocrine Fellow Pager: [phone]

## 2022-06-11 ENCOUNTER — Inpatient Hospital Stay: Payer: PRIVATE HEALTH INSURANCE

## 2022-06-11 LAB — RENAL FUNCTION PANEL W/EGFR
Albumin: 3.8 g/dL (ref 3.5–5.7)
Albumin: 4 g/dL (ref 3.5–5.7)
Anion Gap: 5 mmol/L (ref 3–16)
Anion Gap: 7 mmol/L (ref 3–16)
BUN: 16 mg/dL (ref 7–25)
BUN: 16 mg/dL (ref 7–25)
CO2: 26 mmol/L (ref 21–33)
CO2: 27 mmol/L (ref 21–33)
Calcium: 8.3 mg/dL (ref 8.6–10.3)
Calcium: 8.7 mg/dL (ref 8.6–10.3)
Chloride: 103 mmol/L (ref 98–110)
Chloride: 104 mmol/L (ref 98–110)
Creatinine: 1.39 mg/dL (ref 0.60–1.30)
Creatinine: 1.51 mg/dL (ref 0.60–1.30)
EGFR: 48
EGFR: 54
Glucose: 86 mg/dL (ref 70–100)
Glucose: 94 mg/dL (ref 70–100)
Osmolality, Calculated: 281 mOsm/kg (ref 278–305)
Osmolality, Calculated: 284 mOsm/kg (ref 278–305)
Phosphorus: 2.9 mg/dL (ref 2.1–4.5)
Phosphorus: 3.1 mg/dL (ref 2.1–4.5)
Potassium: 3.6 mmol/L (ref 3.5–5.3)
Potassium: 3.9 mmol/L (ref 3.5–5.3)
Sodium: 135 mmol/L (ref 133–146)
Sodium: 137 mmol/L (ref 133–146)

## 2022-06-11 LAB — CBC
Hematocrit: 38.7 % (ref 38.5–50.0)
Hemoglobin: 13.9 g/dL (ref 13.2–17.1)
MCH: 33.6 pg (ref 27.0–33.0)
MCHC: 36.1 g/dL (ref 32.0–36.0)
MCV: 93.2 fL (ref 80.0–100.0)
MPV: 8 fL (ref 7.5–11.5)
Platelets: 227 10*3/uL (ref 140–400)
RBC: 4.15 10*6/uL (ref 4.20–5.80)
RDW: 12.5 % (ref 11.0–15.0)
WBC: 6 10*3/uL (ref 3.8–10.8)

## 2022-06-11 LAB — MAGNESIUM: Magnesium: 1.8 mg/dL (ref 1.5–2.5)

## 2022-06-11 MED ORDER — ticagrelor (BRILINTA) tablet 180 mg
90 | Freq: Once | ORAL | Status: AC
Start: 2022-06-11 — End: 2022-06-11
  Administered 2022-06-12: 05:00:00 180 mg via ORAL

## 2022-06-11 MED ORDER — potassium chloride (KLOR-CON M20) CR tablet 40 mEq
20 | Freq: Once | ORAL | Status: AC
Start: 2022-06-11 — End: 2022-06-11
  Administered 2022-06-12: 05:00:00 40 meq via ORAL

## 2022-06-11 MED ORDER — ticagrelor (BRILINTA) tablet 90 mg
90 | Freq: Two times a day (BID) | ORAL
Start: 2022-06-11 — End: 2022-06-14
  Administered 2022-06-12 – 2022-06-13 (×3): 90 mg via ORAL

## 2022-06-11 MED ORDER — electrolyte-R (pH 7.4) (NORMOSOL-R pH 7.4) IV solution
INTRAVENOUS | Status: AC
Start: 2022-06-11 — End: 2022-06-11
  Administered 2022-06-11: 17:00:00 100 mL/h via INTRAVENOUS

## 2022-06-11 MED FILL — TOPIRAMATE 50 MG TABLET: 50 50 MG | ORAL | Qty: 1

## 2022-06-11 MED FILL — OYSTER SHELL CALCIUM 500  500 MG CALCIUM (1,250 MG) TABLET: 500 500 mg calcium (1,250 mg) | ORAL | Qty: 1

## 2022-06-11 MED FILL — PYRIDOSTIGMINE BROMIDE 60 MG TABLET: 60 60 mg | ORAL | Qty: 1

## 2022-06-11 MED FILL — HEPARIN (PORCINE) 5,000 UNIT/ML INJECTION SOLUTION: 5000 5,000 unit/mL | INTRAMUSCULAR | Qty: 1

## 2022-06-11 MED FILL — VITAMIN B-12  1,000 MCG TABLET: 1000 1000 MCG | ORAL | Qty: 1

## 2022-06-11 MED FILL — LEVOTHYROXINE 75 MCG TABLET: 75 75 MCG | ORAL | Qty: 1

## 2022-06-11 MED FILL — BUPROPION HCL XL 300 MG 24 HR TABLET, EXTENDED RELEASE: 300 300 MG | ORAL | Qty: 1

## 2022-06-11 MED FILL — LIDODERM 5 % TOPICAL PATCH: 5 5 % | TOPICAL | Qty: 2

## 2022-06-11 MED FILL — MAGNESIUM OXIDE 400 MG (241.3 MG MAGNESIUM) TABLET: 400 400 mg | ORAL | Qty: 1

## 2022-06-11 MED FILL — ATORVASTATIN 40 MG TABLET: 40 40 MG | ORAL | Qty: 1

## 2022-06-11 MED FILL — ASPIRIN 81 MG TABLET,DELAYED RELEASE: 81 81 MG | ORAL | Qty: 1

## 2022-06-11 MED FILL — PANTOPRAZOLE 40 MG TABLET,DELAYED RELEASE: 40 40 MG | ORAL | Qty: 1

## 2022-06-11 MED FILL — BACLOFEN 5 MG TABLET: 5 5 mg | ORAL | Qty: 2

## 2022-06-11 MED FILL — MONTELUKAST 10 MG TABLET: 10 10 mg | ORAL | Qty: 1

## 2022-06-11 MED FILL — CHOLECALCIFEROL (VITAMIN D3) 25 MCG (1,000 UNIT) TABLET: 1000 1000 units | ORAL | Qty: 1

## 2022-06-11 MED FILL — HYDROCODONE 7.5 MG-ACETAMINOPHEN 325 MG TABLET: 7.5-325 7.5-325 mg | ORAL | Qty: 1

## 2022-06-11 MED FILL — CELLCEPT 500 MG TABLET: 500 500 mg | ORAL | Qty: 2

## 2022-06-11 MED FILL — THERA 400 MCG TABLET: 400 400 mcg | ORAL | Qty: 1

## 2022-06-11 MED FILL — PAROXETINE 40 MG TABLET: 40 40 MG | ORAL | Qty: 1

## 2022-06-11 MED FILL — TAMSULOSIN 0.4 MG CAPSULE: 0.4 0.4 mg | ORAL | Qty: 1

## 2022-06-11 MED FILL — QUETIAPINE 12.5 MG DOSE: 12.5 12.5 MG | ORAL | Qty: 1

## 2022-06-11 MED FILL — MORPHINE ER 15 MG TABLET,EXTENDED RELEASE: 15 15 MG | ORAL | Qty: 1

## 2022-06-11 NOTE — Unmapped (Signed)
[facility]  CVICU Progress Note    Room: [phone], LOS: 4    Background / Hosp course to date:     Andre Olson is a 72 y.o. 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     Upon arrival at [facility] on 06/07/2022, pt was HDS/AF. BMP wnl, CBC wnl. ECG showed no e/o ischemia. ASA 325mg  load given.pt went to cath lab. LHC showed LAD: proximal (90%) and mid (80%) stenosis; D1: 80% ostial stenosis . RCA: mid (90%) stenosis. RHC was wnl, w/ low L-sided filling pressures, preserved cardiac output, and normal pulmonary pressures. Pt admitted floor level of care w/ CVICU as primary team for further CT surgery evaluation.     CT surgery ultimately decided patient was poor surgical candidate and interventional cardiology will proceed with PCI. 1/9-1/10 he developed an AKI likely pre-renal that was treating with fluids. Currently being optimized in preparation for PCI.     Interval Hx / Subjective     Significant events/labs/imaging/hemodynamics over past 24h:   - Yesterday: no acute events overnight, patient not endorsing chest pain currently. No events on tele     Brief Daily Plan:   - Ultimately CTS decided patient is not a surgical candidate  - Planning for PCI  possibly tomorrow but more likely later this week or next week  - npo @ midnight  - normosol 1L maintenance fluids  - evening renal panel to assess renal function  - AVOID if possible (myasthenia gravis):  - IV Magnesium, beta blockers, curare and related drugs, aminoglycosides, macrolides, FQN, CCB, quinine and procainamide.  - Replace Magnesium orally. IV MAGNESIUM CAN PRECIPITATE A MG CRISIS.    Medications      Allergies:   Allergies   Allergen Reactions    Dilaudid  [Hydromorphone (Bulk)]      hallucinations    Lamotrigine Hives       Scheduled Meds:   aspirin  81 mg Oral Daily with breakfast    atorvastatin  40 mg Oral Nightly (2100)    baclofen  10 mg Oral BID    buPROPion HCL  300 mg Oral Daily 0900    calcium carbonate 1250 mg tablet (500 mg elemental calcium)  1,250 mg Oral BID    cholecalciferol (vitamin D3)  1,000 Units Oral Daily 0900    cyanocobalamin  1,000 mcg Oral Daily 0900    fluticasone propionate  1 spray Each Nare BID    heparin  5,000 Units Subcutaneous 3 times per day    HYDROcodone-acetaminophen  1 tablet Oral TID    levothyroxine  175 mcg Oral Daily 0900    lidocaine  2 patch Transdermal Daily 0900    magnesium oxide  400 mg Oral Daily 0900    montelukast  10 mg Oral Nightly (2100)    morphine SR  15 mg Oral Nightly (2100)    multivitamin with folic acid  1 tablet Oral Daily 0900    mycophenolate  1,000 mg Oral BID    olodateroL  2 puff Inhalation RT Daily    pantoprazole  40 mg Oral DAILY 0600    PARoxetine  40 mg Oral Daily 0900    pyridostigmine  60 mg Oral TID    QUEtiapine  12.5 mg Oral Nightly (2100)    tamsulosin  0.4 mg Oral Nightly (2100)    topiramate  50 mg Oral BID    umeclidinium  62.5 mcg Inhalation RT Daily       PRN Meds:  acetaminophen, albuterol, loperamide, ondansetron, propylene glycoL     Continuous Infusions:      Vital Signs     Temp:  [97.3 F (36.3 C)-98.1 F (36.7 C)] 97.3 F (36.3 C)  Heart Rate:  [59-71] 61  Resp:  [16-17] 16  BP: (111-119)/(56-76) 112/56    Physical Exam     Constitutional:       General: He is not in acute distress.     Appearance: Normal appearance. He is not ill-appearing.   Eyes:      General: No scleral icterus.     Extraocular Movements: Extraocular movements intact.   Cardiovascular:      Rate and Rhythm: Regular rhythm. Bradycardia present.      Pulses: Normal pulses.           Posterior tibial pulses are 2+ on the right side and 2+ on the  left side.      Heart sounds: Murmur heard.      No friction rub. No gallop.   Pulmonary:      Effort: Pulmonary effort is normal.      Breath sounds: Normal breath sounds.   Abdominal:      General: Bowel sounds are normal.      Palpations: Abdomen is soft.   Musculoskeletal:      Right lower leg: No edema.      Left lower leg: No edema.   Skin:     General: Skin is warm and dry.      Capillary Refill: Capillary refill takes less than 2 seconds.   Neurological:      Mental Status: He is alert and oriented to person, place, and time.      Motor: Motor function is intact. No weakness.      Comments: Strength 4/5 in all ROM in bilateral LE.  Strength 4/5 in abduction, adduction, flexion and extension of bilateral UE.     Labs     Renal  Recent Labs     06/09/22  0557 06/10/22  0455 06/11/22  0549   NA 139 140 137   K 3.9 3.8 3.9   CL 105 107 104   CO2 27 27 26    BUN 14 12 16    CREATININE 1.30 1.35* 1.51*   CALCIUM 8.6 8.7 8.7   MG 1.8 1.8 1.8   PHOS 4.4 3.2 3.1    CBC  Recent Labs     06/09/22  0557 06/10/22  0455 06/11/22  0549   WBC 5.5 6.7 6.0   HGB 13.3 13.4 13.9   HCT 36.8* 37.7* 38.7   PLT 207 224 227    Liver  Recent Labs     06/09/22  0557 06/10/22  0455 06/11/22  0549   ALBUMIN 3.6 3.7 3.8          Other Diagnostic Studies     Radiology past 24 hrs:  Echo 2D Complete (TTE)    Result Date: 06/10/2022                   * [facility]*  [place], [place] [address]                                 [phone] Transthoracic Echocardiogram Patient:     Andre Olson, Andre Olson      Room:   8469       Height: 70in MR Number:   [id number]                   DOB:    [birth year] Weight: 159lb Account:     [id number]                 Gender: M          BP:     94 / 57 Study Date:  06/10/2022                 Age:    25         BSA:    1.67m^2 Referring physician:    Mindi Curling Interpreting physician: Carlis Abbott, MD    PERFORMING   Carlis Abbott, MD  SONOGRAPHER  Forestine Na   Team, [facility] Consult  ATTENDING    Woodfin Ganja, MD  ADMITTING    Woodfin Ganja, MD ---------------------------------------------------------------------------- Procedure:ECHO 2D COMPLETE (TTE)   Order: Accession Number:[id number] ---------------------------------------------------------------------------- Indications:      Heart disease (I51.9). ---------------------------------------------------------------------------- PMH:  CAD, COPD, Syncope. ---------------------------------------------------------------------------- Study data:  Height: 70in. 177.8cm. Weight: 159lb. 72.1kg. No prior study was available for comparison.  Study status:  Routine.  Procedure:  A transthoracic echocardiogram was performed. Image quality was poor. The study was technically limited due to poor acoustic window availability and body habitus. Scanning was performed from the parasternal, apical, and subcostal acoustic windows.          Transthoracic echocardiogram.  M-mode, complete 2D, complete spectral Doppler, color Doppler, and tissue Doppler. Birthdate:  Patient birthdate: 1950/11/15.  Age:  Patient is 67year(s) old. Sex:  Birth gender: male.  Body mass index:  BMI: 22.8kg/m^2.  Body surface area:    BSA: 1.53m^2.  Blood pressure:     94/57  Patient status: Inpatient.  Study date:  Study date: 06/10/2022. Study time: 08:48 AM. Location:  Echo laboratory. ---------------------------------------------------------------------------- Study Conclusions - Procedure narrative: Image quality was poor. The study was technically   limited due to poor acoustic window availability and body habitus. - Left ventricle: The cavity size is normal. Wall thickness is normal.   Systolic function is normal. The estimated ejection fraction is 50-55%.   Although no diagnostic regional wall motion abnormality is  identified,   this possibility cannot be completely excluded on the basis of this study.   Grade I diastolic dysfunction. - Right ventricle: Systolic function is normal. - Pulmonary arteries: Systolic pressure could not be accurately estimated. - Inferior vena cava: The IVC is normal-sized. Respirophasic diameter   changes are in the normal range (> 50%), consistent with normal central   venous pressure. Impressions:  No previous study was available for comparison. ---------------------------------------------------------------------------- Cardiac Anatomy Left ventricle: - The cavity size is normal. Wall thickness is normal. Systolic function is   normal. The estimated ejection fraction is 50-55%. Although  no diagnostic   regional wall motion abnormality is identified, this possibility cannot be   completely excluded on the basis of this study. - Grade I diastolic dysfunction. Aorta: Aortic root: The root is normal in size. Aortic valve: - TrileafletThe leaflets are normal thickness. Mobility is not restricted.   Velocity is within the normal range. There is no stenosis. There is no   regurgitation. The mean systolic gradient is 1mm Hg. The peak systolic   gradient is 2mm Hg. The LVOT to aortic valve VTI ratio is 0.91. The ratio   of LVOT to aortic valve peak velocity is 0.78. Mitral valve: - The valve is structurally normal. Mobility is not restricted. Inflow   velocity is within the normal range. There is no evidence for stenosis.   There is no regurgitation. The valve area by pressure half-time is   1.7cm^2. The valve area index by pressure half-time is 0.89cm^2/m^2. Left atrium:  The atrium is normal in size. Pulmonary artery: - The main pulmonary artery is normal-sized. Systolic pressure could not be   accurately estimated. Main pulmonary artery: - Right ventricle: - The cavity size is normal. Wall thickness is normal. Systolic function is   normal. Pulmonic valve: - Velocity is within the normal range. There  is no evidence for stenosis.   There is no regurgitation. Tricuspid valve: - The valve is structurally normal. Inflow velocity is within the normal   range. There is no regurgitation. Right atrium:  The atrium is normal in size. Pericardium: - There is no pericardial effusion. Systemic veins: Inferior vena cava: The IVC is normal-sized. The IVC is normal-sized. Respirophasic diameter changes are in the normal range (> 50%), consistent with normal central venous pressure. ---------------------------------------------------------------------------- Measurements  Left ventricle            Value          Ref  EDD, LAX              (N) 4.3   cm       4.2 - 5.8  ESD, LAX              (N) 3.2   cm       2.5 - 4.0  EDD/bsa, LAX          (N) 2.3   cm/m^2   2.2 - 3.0  ESD/bsa, LAX          (N) 1.7   cm/m^2   1.3 - 2.1  FS, LAX               (N) 26    %        25 - 43  FS, LAX chord         (N) 26    %        25 - 43  IVS, ED               (N) 0.9   cm       0.6 - 1.0  ESD                   (N) 3.2   cm       2.5 - 4.0  ESD/bsa               (N) 1.7   cm/m^2   1.3 - 2.1  PW, ED                (H) 1.2  cm       0.6 - 1.0  IVS/PW, ED                0.75           ---------  EDV                   (N) 83    ml       62 - 150  ESV                   (N) 41    ml       21 - 61  EF                    (L) 51    %        52 - 72  EDV/bsa               (N) 44    ml/m^2   34 - 74  ESV/bsa               (N) 22    ml/m^2   11 - 31  SV, 1-p A2C               42    ml       ---------  SV/bsa, 1-p A2C           22.3  ml/m^2   ---------  SV, 1-p A4C               47    ml       ---------  SV/bsa, 1-p A4C           25    ml/m^2   ---------  E', lat ann, TDI      (N) 11.0  cm/sec   >=10.0  E/e', lat ann, TDI    (N) 4              <=13  A', lat ann, TDI          7.9   cm/sec   ---------  E'/a', lat ann, TDI       1.39           ---------  S', lat ann, TDI          4.7   cm/sec   ---------  E', med ann, TDI      (N) 7.5   cm/sec   >=7.0  E/e', med  ann, TDI        5              ---------  A', med ann, TDI          12.3  cm/sec   ---------  E'/a', med ann, TDI       0.61           ---------  S', med ann, TDI          5.8   cm/sec   ---------  E', avg, TDI              9.2   cm/sec   ---------  E/e', avg, TDI        (N) 4              <=14   LVOT                      Value  Ref  Peak vel, S               0.55  m/sec    ---------  Peak grad, S              1     mm Hg    ---------   Right ventricle           Value          Ref  S' lateral            (N) 9.6   cm/sec   >=9.5   RVOT                      Value          Ref  Peak v, S                 0.39  m/sec    ---------  Mean v, S                 0.29  m/sec    ---------   Left atrium               Value          Ref  Area ES, A4C          (H) 26    cm^2     <=20  Area/bsa ES, A4C          13.49 cm^2/m^2 ---------  SI dim, A2C               4.5   cm       ---------  Vol, S                (H) 60    ml       18 - 58  Vol/bsa, S            (N) 32    ml/m^2   16 - 34  Vol, ES, 1-p A4C      (H) 89    ml       18 - 58  Vol/bsa, ES, 1-p A4C  (H) 47    ml/m^2   12 - 37  Vol, ES, 1-p A2C      (N) 33    ml       18 - 58  Vol/bsa, ES, 1-p A2C  (N) 17    ml/m^2   11 - 43  Vol, ES, 2-p              60    ml       ---------  Vol/bsa, ES, 2-p      (N) 32    ml/m^2   16 - 34   Right atrium              Value          Ref  Area, ES, A4C         (N) 13    cm^2     10 - 18   Aortic valve              Value          Ref  Peak v, S                 0.7   m/sec    ---------  Mean v, S  0.47  m/sec    ---------  Mean grad, S              1     mm Hg    ---------  Peak grad, S              2     mm Hg    ---------  LVOT/AV, VTI ratio        0.91           ---------  LVOT/AV, Vpeak ratio      0.78           ---------   Mitral valve              Value          Ref  Peak E                    0.39  m/sec    ---------  Peak A                    0.52  m/sec    ---------  Decel slope               86.2  cm/s^2    ---------  Decel time                447   ms       ---------  PHT                       131   ms       ---------  Peak E/A ratio            0.7            ---------  MVA, PHT                  1.7   cm^2     ---------  MVA/bsa, PHT              0.89  cm^2/m^2 ---------   Pulmonic valve            Value          Ref  Peak v, S                 0.5   m/sec    ---------  Mean vel, S               0.35  m/sec    ---------   Tricuspid valve           Value          Ref  TR peak v             (N) 1.8   m/sec    <=2.8  Peak RV-RA grad, S        13    mm Hg    ---------   Main pulmonary artery     Value          Ref  Mean grad                 1     mm Hg    --------- Legend: (L)  and  (H)  mark values outside specified reference range. (N)  marks values inside specified reference range. Reviewed and confirmed by Claretta Fraise, MD 2024-01-08T10:52:40    [facility] Carotid Duplex Bilateral    Result Date:  06/10/2022  Bilateral: Doppler criteria are suggestive for less than 50% ICA stenosis.  The bilateral mid vertebral arteries appear patent with antegrade flow. Conclusions: Less than 50% stenosis bilateral ICA.Antegrade bilateral vertebral artery flow. Procedure: Real time B-mode imaging along with spectral and color Doppler were used to evaluate the extracranial carotid arteries routinely including the common carotid artery starting at the base of the neck, extending to the bifurcation, the proximal external carotid artery and the internal carotid artery, beginning at the origin and extending as far as it can be visualized. Imaging was performed in longitudinal and transverse planes. Spectral Doppler information was obtained in a longitudinal orientation with a 60 degree or less angle throughout the extracranial carotids, allowing analysis of the Doppler velocities to quantify severity of disease. The proximal subclavian and mid-vertebral arteries are routinely included in this evaluation.    VASC Vein Mapping Lower Extremity  Bilateral    Result Date: 06/10/2022  Right: Normal appearance and compressibility is seen in the right great saphenous vein from the sapheno-femoral junction to the ankle. The vessel is greater than 0.2 cm in diameter at all levels and is 72 cm in length. Left: Normal appearance and compressibility is seen in the left great saphenous vein from the sapheno-femoral junction to the ankle. The vessel is greater than 0.2 cm in diameter at all levels and is 72 cm in length. Conclusions: 72 cm of right GSV meet criteria.  72 cm of left GSV meet criteria. Procedure: Lower extremity venous imaging was performed using real time B-mode in the transverse plane with and without compression to evaluate for the presence or absence of thrombus. Visualization included the great saphenous vein from saphenofemoral junction to the ankle.  The small saphenous vein was included if the great saphenous appeared inadequate. The diameters and length of the vessels were measured to determine their suitability for use.     Assessment & Plan     Andre Olson is a 72 y.o. 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      Cardiovascular:    # CAD, severe two-vessel disease  LHC showed LAD: proximal (90%) and mid (80%) stenosis; D1: 80% ostial stenosis . RCA: mid (90%) stenosis. RHC was wnl, w/ low L-sided filling pressures, preserved cardiac output, and normal pulmonary pressures.  CT Surgery consulted, patient not a surgical candidate. Will plan for PCI once renal function stable.   - Continue ASA 81mg , atorvastatin 40mg  daily.    Renal (Electrolytes, Acid/Base):  #AKI  Cr steadily rising since 1/5. 1.08 >1.15 > 1.3 > 1.51. Patient  admittedly was not drinking much fluid.   - 1L normosol maintenance along with encouragement of oral hydration  - repeat PM renal panel     Neurology, Pain, Sedation:   #chronic pain  - morphine 15mg  qhs.  - Norco 7.5 - 325 PO TID.  - baclofen 10mg  BID  - lidocaine patch     #restless legs  - topiramate 50mg  BID.    # seronegativemyasthenia gravis  #autonomic neuropathy  - neurology consulted, appreciate recs              - obtain baseline respiratory parameters (NIF and FVC)  - continue Mestinon 60mg  TID + Cellcept  1gm BID.  - AVOID if possible: IV Magnesium, beta blockers, curare and related drugs, aminoglycosides, macrolides, FQN, CCB, quinine, and procainamide  - would discuss neuromuscular blocking agents with anesthesia during pre-op evaluation     Pulmonary:   #COPD  - Continue home olodaterol, tiotropium daily  - continue home montelukast 10mg  qhs  - albuterol PRN.  - Flonase BID.     #hx of DVT/PE (2018, 2019)  - not on AC     Gastrointestinal:  #Diarrhea  -PRN Imodium     Genitourinary:  #BPH  #hx Peyronie's disease  Pt reports being prescribed finasteride, but not taking at home.  - continue home tamsulosin 0.4mg  qhs.     Infectious Disease:   Antibiotics:  None     Cultures:  None     - No acute issues.     Endocrine:  # hx thyroid papillary cancer (s/p ablation)  # hypothyroidism  TSH 62.83 (elevated), T4 0.5 (low) on admission (1/5)  - continue home levothyroxine daily  - T3 dosing post-operatively.   - consulted endocrine, appreciate recs, will follow re-engage when procedure date is known.     Hematology/Oncology:  - No acute issues.      Rheumatology:  - No acute issues.     Musculoskeletal:  - No acute issues.     Psychiatry:  #MDD  #PTSD  - bupriopion 300mg  daily  - paroxetine 40mg  daily  - quetiapine 25mg  qhs -> decreased to 12.5mg  qhs     F/E/N:  - Repalce electrolytes PRN.  - Nutrition: Cardiac Diet  - SLP: N/A.  - Nutrition eval: N/A.      Code Status: Full Code  DVT PPx: SQH TID.   GI  PPx: PPI.   PT/OT: consulted.  - PT Recs: Recommendation: Home PT (pending further mobility)  Equipment Recommended: Patient already has needed DME  - OT Recs: Recommendation: Anticipate no further OT needed after discharge (Pending orthostatic hypotension management and further mobility practice)  Equipment Recommendations: None  - SLP Recs:      SignedDaine Gravel  CVICU Resident  06/11/2022, 7:02 AM

## 2022-06-11 NOTE — Unmapped (Signed)
Cardiac Surgery Update:    Mr. Andre Olson is very familiar to the Cardiac Surgery team. Briefly, he has an extensive complex PMH that includes Myasthenia Gravis, CAD, autonomic neuropathy, DVT/Massive PE s/p EKOS (2018), recurrent PE, Thyroid Papillary cancer s/p ablation, COPD who presented with Chest pain. LHC revealed MV-CAD. Given his hospitalization, Neurology gave recommendations to avoid IV magnesium and beta blockade, Anesthesia evaluated him as high risk for cardiac surgery. Endocrinology was following for Uncontrolled Hypothyroidism d/t Lt4 non-adherence, with TSH 62.83 and Free T4 0.58. Per Endocrine evaluated patient and labs and there is no contraindication to cardiac surgery. Dr. [name] reviewed all consults and labs and with the multiple, complex co-morbidities, he is high risk for surgery and should be evaluated for stents. We will not offer him a MICS-CABG, instead, we recommend continued workup for PCI with interventional Cardiology.    Rayna Sexton, [place]  [facility] Cardiac Surgery  [phone]

## 2022-06-11 NOTE — Unmapped (Signed)
Patient has been arranged for home care with [facility] [phone] for Home PT.  Notified treating team that home care orders to be completed.      Shireen Quan  Discharge Planning Assistant  [phone]

## 2022-06-11 NOTE — Unmapped (Signed)
[facility] sent referrals for patient to recieve [facility] for PT. [facility] will follow.      Shireen Quan  Discharge Planning Assistant  [phone]

## 2022-06-11 NOTE — Unmapped (Signed)
  Case Management/Social Work Department  Progress Note  Patient Information     Patient Name: Andre Olson  MRN: [id number]  Hospital day: 4  Inpatient/Observation:  Inpatient   Level of Care:  Team A [id number]  Admit date:  06/07/2022  Admission diagnosis: Angina    PMH:  has a past medical history of Benign prostate hyperplasia, Cancer (CMS-HCC), COPD (chronic obstructive pulmonary disease) (CMS-HCC), Coronary artery disease, Depression, GERD (gastroesophageal reflux disease), Hypertension, Hypothyroid, Myasthenia gravis (CMS-HCC), and Orthostatic hypotension.    PCP:  Margaretha Glassing, MD    Home Pharmacy:    [place]  [place] [address]  Phone: [phone]     [place]  [place] [address]  Phone: [phone]         Medical Insurance Coverage:  Payor: [phone] MANAGED MEDICARE / Plan: [employer] GOLD PLUS MEDICARE / Product Type: Medicare Mngd Care /     Other Pertinent Information     SW completed chart review and rounded with the team. Per team, patient not medically ready. CT surgery stated that patient is not a surgical candidate for CABG, planning for PCI hopefully this week but possibly early next week. Treating AKI with fluids.     Discharge Plan     Anticipated discharge plan:  Home with [facility]      Anticipated discharge date:  Mid next week      CM/SW will continue to follow and remain available for discharge planning needs.      Hardin Negus, MSW [facility]  Inpatient Social Worker  Care Coordinator   [phone]

## 2022-06-12 ENCOUNTER — Inpatient Hospital Stay

## 2022-06-12 LAB — RENAL FUNCTION PANEL W/EGFR
Albumin: 3.8 g/dL (ref 3.5–5.7)
Anion Gap: 6 mmol/L (ref 3–16)
BUN: 16 mg/dL (ref 7–25)
CO2: 27 mmol/L (ref 21–33)
Calcium: 8.3 mg/dL (ref 8.6–10.3)
Chloride: 107 mmol/L (ref 98–110)
Creatinine: 1.44 mg/dL (ref 0.60–1.30)
EGFR: 51
Glucose: 96 mg/dL (ref 70–100)
Osmolality, Calculated: 291 mOsm/kg (ref 278–305)
Phosphorus: 3.2 mg/dL (ref 2.1–4.5)
Potassium: 3.8 mmol/L (ref 3.5–5.3)
Sodium: 140 mmol/L (ref 133–146)

## 2022-06-12 LAB — CBC
Hematocrit: 37.2 % (ref 38.5–50.0)
Hemoglobin: 13.6 g/dL (ref 13.2–17.1)
MCH: 33.7 pg (ref 27.0–33.0)
MCHC: 36.5 g/dL (ref 32.0–36.0)
MCV: 92.2 fL (ref 80.0–100.0)
MPV: 7.6 fL (ref 7.5–11.5)
Platelets: 211 10*3/uL (ref 140–400)
RBC: 4.03 10*6/uL (ref 4.20–5.80)
RDW: 12.4 % (ref 11.0–15.0)
WBC: 6.9 10*3/uL (ref 3.8–10.8)

## 2022-06-12 LAB — PFT13-PULMONARY FUNCTION TEST
DLCO%: 44
DLCO: 13.3
FEV1%: 101
FEV1/FVC EXP: 73
FEV1/FVC: 75
FEV1: 3.42
FVC%: 98
FVC: 4.54
RV%: 44
RV: 1.23
TLC%: 74
TLC: 5.53
VC%: 92
VC: 4.3

## 2022-06-12 LAB — MAGNESIUM: Magnesium: 2 mg/dL (ref 1.5–2.5)

## 2022-06-12 MED ORDER — electrolyte-R (pH 7.4) (NORMOSOL-R pH 7.4) IV bolus 1,000 mL
Freq: Once | INTRAVENOUS | Status: AC
Start: 2022-06-12 — End: 2022-06-12
  Administered 2022-06-12: 17:00:00 1000 mL via INTRAVENOUS

## 2022-06-12 MED ORDER — electrolyte-R (pH 7.4) (NORMOSOL-R pH 7.4) IV bolus 500 mL
Freq: Once | INTRAVENOUS | Status: AC
Start: 2022-06-12 — End: 2022-06-12
  Administered 2022-06-12: 21:00:00 500 mL via INTRAVENOUS

## 2022-06-12 MED FILL — HYDROCODONE 7.5 MG-ACETAMINOPHEN 325 MG TABLET: 7.5-325 7.5-325 mg | ORAL | Qty: 1

## 2022-06-12 MED FILL — BACLOFEN 5 MG TABLET: 5 5 mg | ORAL | Qty: 2

## 2022-06-12 MED FILL — PAROXETINE 40 MG TABLET: 40 40 MG | ORAL | Qty: 1

## 2022-06-12 MED FILL — MAGNESIUM OXIDE 400 MG (241.3 MG MAGNESIUM) TABLET: 400 400 mg | ORAL | Qty: 1

## 2022-06-12 MED FILL — BRILINTA 90 MG TABLET: 90 90 mg | ORAL | Qty: 1

## 2022-06-12 MED FILL — CELLCEPT 500 MG TABLET: 500 500 mg | ORAL | Qty: 2

## 2022-06-12 MED FILL — PYRIDOSTIGMINE BROMIDE 60 MG TABLET: 60 60 mg | ORAL | Qty: 1

## 2022-06-12 MED FILL — QUETIAPINE 12.5 MG DOSE: 12.5 12.5 MG | ORAL | Qty: 1

## 2022-06-12 MED FILL — LEVOTHYROXINE 75 MCG TABLET: 75 75 MCG | ORAL | Qty: 1

## 2022-06-12 MED FILL — OYSTER SHELL CALCIUM 500  500 MG CALCIUM (1,250 MG) TABLET: 500 500 mg calcium (1,250 mg) | ORAL | Qty: 1

## 2022-06-12 MED FILL — PANTOPRAZOLE 40 MG TABLET,DELAYED RELEASE: 40 40 MG | ORAL | Qty: 1

## 2022-06-12 MED FILL — BUPROPION HCL XL 300 MG 24 HR TABLET, EXTENDED RELEASE: 300 300 MG | ORAL | Qty: 1

## 2022-06-12 MED FILL — LIDODERM 5 % TOPICAL PATCH: 5 5 % | TOPICAL | Qty: 2

## 2022-06-12 MED FILL — VITAMIN B-12  1,000 MCG TABLET: 1000 1000 MCG | ORAL | Qty: 1

## 2022-06-12 MED FILL — CHOLECALCIFEROL (VITAMIN D3) 25 MCG (1,000 UNIT) TABLET: 1000 1000 units | ORAL | Qty: 1

## 2022-06-12 MED FILL — MONTELUKAST 10 MG TABLET: 10 10 mg | ORAL | Qty: 1

## 2022-06-12 MED FILL — TAMSULOSIN 0.4 MG CAPSULE: 0.4 0.4 mg | ORAL | Qty: 1

## 2022-06-12 MED FILL — BRILINTA 90 MG TABLET: 90 90 mg | ORAL | Qty: 2

## 2022-06-12 MED FILL — TOPIRAMATE 50 MG TABLET: 50 50 MG | ORAL | Qty: 1

## 2022-06-12 MED FILL — HEPARIN (PORCINE) 5,000 UNIT/ML INJECTION SOLUTION: 5000 5,000 unit/mL | INTRAMUSCULAR | Qty: 1

## 2022-06-12 MED FILL — MORPHINE ER 15 MG TABLET,EXTENDED RELEASE: 15 15 MG | ORAL | Qty: 1

## 2022-06-12 MED FILL — POTASSIUM CHLORIDE ER 20 MEQ TABLET,EXTENDED RELEASE(PART/CRYST): 20 20 MEQ | ORAL | Qty: 2

## 2022-06-12 MED FILL — THERA 400 MCG TABLET: 400 400 mcg | ORAL | Qty: 1

## 2022-06-12 MED FILL — ATORVASTATIN 40 MG TABLET: 40 40 MG | ORAL | Qty: 1

## 2022-06-12 MED FILL — ASPIRIN 81 MG TABLET,DELAYED RELEASE: 81 81 MG | ORAL | Qty: 1

## 2022-06-12 NOTE — Unmapped (Signed)
Occupational Therapy  Treatment     Name: Andre Olson  DOB: [birth year]  Attending Physician: Bufford Spikes, MD  Admission Diagnosis: Angina  Date: 06/12/2022  Room: 1610/R6045  Reviewed Pertinent hospital course: Yes   Hospital Course PT/OT: Pt 72 y.o male who presents for LHC/RHC 2/2 worsening dyspnea with concurrent chest pain. 1/5 RHC: WNL, preserved cardiac output; LHC: proximal + mid LAD stenosis, mid RCA stenosis, ostial 90% diagonal disease; Cardiac surgery consult with CABG recommendations pending following workup. 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), thyroid papillary cancer (s/p ablation), MDD, PTSD, COPD, and substance use disorder (alcohol)  Precautions: None  Activity Level: Activity as tolerated    Assist: None      Recommendation  Recommendation: Outpatient OT  Equipment Recommendations: Patient already has needed DME  Patient already has needed DME: shower chair      Assessment  Assessment: Decreased self-care transfers, Decreased IADLs, Decreased activity tolerance, Decreased ADL status, Decreased Functional Mobility, Decreased Balance          Prognosis for OT goals: Good                        Patient tolerated tx session mildly well however limited by nausea this date. Patient complete basic ADLs and mobility with SBA however remains unsteady at this time. Please see tx details below.     Outcome Measures  AM-PAC 6 Clicks Daily Activity Inpatient Short Form: OT 6 Clicks Score: 19     Cognition  Overall Cognitive Status: Within Functional Limits  Arousal/Alertness: Alert  Orientation Level: Oriented X4  Behavior: Appropriate;Cooperative  Following Commands: Follows all commands and directions without difficulty  Safety Judgment: Impaired judgment  Insight: Demonstrated decreased insight into  limitations and abilities to complete ADLs safely    Pain  Pain Score: 0 - No Pain    Exercises       Functional Mobility  Bed Mobility  Supine to Sit: Modified independent  Sit to Supine: Modified independent  Functional Transfers  Sit to Stand: Stand-by assistance  Stand to Sit: Stand-by assistance  Toilet Transfers: Stand by assistance  Functional Mobility: Stand-by assistance  Functional Mobility Comment: Close SBA during ambulation with mild unsteadiness noted however no overt LOB; pt with + SOB requiring monitoring of vitals - O2 saturation stable, mildly orthostatic (113/76 dropped to 110/74)  Balance  Sitting - Static: Independent  Sitting - Dynamic: Independent  Standing - Static: Supervision  Standing - Dynamic: Stand-by assistance    Gait belt used: Yes    ADL  Feeding Deficit Additional Comments: education provided on weight utensils  Grooming: Stand-by assistance  Grooming Deficit: Set-up;Supervision/safety;Increased time to complete;Wash/dry hands  Location Assessed Grooming: Standing at sink  Toileting: Stand-by assistance  Toileting Deficit: Supervison/safety;Increased time to complete;Verbal cueing  Location Assessed Toileting: Public librarian Deficit Additional Comments: pt unsteady when leaning to complete pericares requiring close SBA       Position after Treatment/Safety Handoff  Position after therapy session: Bed  Details: RN notified;Call light/ needs within reach  Alarms: Bed  Alarms Status: Activated and Interfaced with call system    Goals   Goals to be met in: 1 week  Patient stated goal: to go home, to increase activity, to increase independence, to return to baseline/PLOF  Patient will complete supine to sit in prep for ADLs: Independent (from flat bed)  Patient will complete toilet transfer: Modified Independent  Patient will complete toileting: Modified Independent  Patient will complete lower body dressing: Modified Independent  Patient will complete lower body bathing : Modified  Independent  Miscellaneous Goal #1: Patient will complete an IADL task assessment  Long Term Goal : Pt will complete IADLs with Set up assist  Long term goal to be met in: 2 weeks  Collaborated with: Patient    Plan  Plan  Treatment Interventions: ADL retraining, Activity Tolerance training, Energy Conservation, IADL retraining, Equipment eval/education, Patient/Family training, Functional transfer training, Therapeutic Activity, Compensatory technique education, Continued evaluation  Progress: Progressing toward goals  OT Frequency: minimum 2x/week  The plan of care and recommendations assesses the patient's and/or caregiver's readiness, willingness, and ability to provide or support functional mobility and ADL tasks as needed upon discharge.    Patient/Family Education  Educated patient on the role of occupational therapy, OT goals, OT plan of care, discharge recommendation, ADL training, functional mobility training, and the importance of safety and fall prevention strategies including need for supervision/ assistance with OOB activity and use of call light. patient  verbalized understanding and demonstrated understanding.    OT Time  Start Time: 5956  Stop Time: 1449  Time Calculation (min): 23 min    OT Charges     $Self Care/ADL/Home Management Training: 23-37 mins           Problem List  Patient Active Problem List   Diagnosis    Stenosis, cervical spine    Syncope    Myasthenia gravis with exacerbation, adult form (CMS-HCC)     Past Medical History  Past Medical History:   Diagnosis Date    Benign prostate hyperplasia     Cancer (CMS-HCC)     COPD (chronic obstructive pulmonary disease) (CMS-HCC)     Coronary artery disease     Depression     GERD (gastroesophageal reflux disease)     Hypertension     Hypothyroid     Myasthenia gravis (CMS-HCC)     Orthostatic hypotension      Past Surgical History  Past Surgical History:   Procedure Laterality Date    LEFT AND RIGHT HEART CATHETERIZATION N/A 06/07/2022     Procedure: Left and Right Heart Cath;  Surgeon: Woodfin Ganja, MD;  Location: [facility];  Service: Cath;  Laterality: N/A;    TOTAL THYROIDECTOMY  March 1995    ulnar nerve release

## 2022-06-12 NOTE — Unmapped (Signed)
Pt's height is in EPIC as 6'8 however pt's height is actually 6'0.

## 2022-06-12 NOTE — Unmapped (Signed)
  Case Management/Social Work Department  Progress Note  Patient Information     Patient Name: Andre Olson  MRN: [id number]  Hospital day: 5  Inpatient/Observation:  Inpatient   Level of Care:  Team A [id number]  Admit date:  06/07/2022  Admission diagnosis: Angina    PMH:  has a past medical history of Benign prostate hyperplasia, Cancer (CMS-HCC), COPD (chronic obstructive pulmonary disease) (CMS-HCC), Coronary artery disease, Depression, GERD (gastroesophageal reflux disease), Hypertension, Hypothyroid, Myasthenia gravis (CMS-HCC), and Orthostatic hypotension.    PCP:  Margaretha Glassing, MD    Home Pharmacy:    [facility]  [place] [address]  Phone: [phone]     [place] [address]  Phone: [place], [place] [address]  Phone: [phone]         Medical Insurance Coverage:  Payor: [phone] MANAGED MEDICARE / Plan: HU[employer] GOLD PLUS MEDICARE / Product Type: Medicare Mngd Care /     Other Pertinent Information     SW completed chart review and rounded with the team. Per team, patient not medically ready. Patient will likely  discharge tomorrow, patient will get PCI at the [place] next week.     Patient has been rec'd for home PT. Patient has been accepted at [facility] [phone].     Discharge Plan     Anticipated discharge plan:  Home with Home Care      Anticipated discharge date:  1/11     CM/SW will continue to follow and remain available for discharge planning needs.      Hardin Negus, MSW [facility]  Inpatient Social Worker  Care Coordinator   [phone]

## 2022-06-12 NOTE — Unmapped (Signed)
Ambulated patient to the bathroom, but patient refused gait belt.

## 2022-06-12 NOTE — Unmapped (Signed)
[facility]  CVICU Progress Note    Room: [phone], LOS: 5    Background / Hosp course to date:     Andre Olson is a 72 y.o. 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     Upon arrival at [facility] on 06/07/2022, Andre Olson was HDS/AF. BMP wnl, CBC wnl. ECG showed no e/o ischemia. ASA 325mg  load given.Andre Olson went to cath lab. LHC showed LAD: proximal (90%) and mid (80%) stenosis; D1: 80% ostial stenosis . RCA: mid (90%) stenosis. RHC was wnl, w/ low L-sided filling pressures, preserved cardiac output, and normal pulmonary pressures. Andre Olson admitted floor level of care w/ CVICU as primary team for further CT surgery evaluation.      CT surgery ultimately decided Andre Olson was poor surgical candidate and interventional cardiology will proceed with PCI. 1/9-1/10 Andre Olson developed an AKI likely pre-renal that was treating with fluids. Currently being optimized in preparation for PCI.     Interval Hx / Subjective     Significant events/labs/imaging/hemodynamics over past 24h:   - Yesterday: Ticagrelor loaded for possible PCI today, gave fluids for AKI which slightly improved.    - Overnight: no acute events  - Telemetry reviewed from overnight: no clinically significant arrhythmias noted    Brief Daily Plan:  - [facility]  - 1/2L fluid bolus + 1L of maintenance fluids, encourage oral intake  - was orthostat positive, likely a combination of dehydration and polypharmacy  - cannot add on imdur 2/2 orthostasis, will re-evaluate tomorrow.   - [facility]      Medications     Allergies:   Allergies   Allergen Reactions    Beta-Blockers (Beta-Adrenergic  Blocking Agts)      Avoid as possible with hx myasthenia gravis.    Calcium Channel Blocking Agent Diltiazem Analogues      Avoid as possible with hx myasthenia gravis.    Dilaudid  [Hydromorphone (Bulk)]      hallucinations    Lamotrigine Hives    Magnesium      Avoid IV magnesium if possible with hx myasthenia gravis.       Scheduled Meds:   aspirin  81 mg Oral Daily with breakfast    atorvastatin  40 mg Oral Nightly (2100)    baclofen  10 mg Oral BID    buPROPion HCL  300 mg Oral Daily 0900    calcium carbonate 1250 mg tablet (500 mg elemental calcium)  1,250 mg Oral BID    cholecalciferol (vitamin D3)  1,000 Units Oral Daily 0900    cyanocobalamin  1,000 mcg Oral Daily 0900    fluticasone propionate  1 spray Each Nare BID    heparin  5,000 Units Subcutaneous 3 times per day    HYDROcodone-acetaminophen  1 tablet Oral TID    levothyroxine  175 mcg Oral Daily 0900    lidocaine  2 patch Transdermal Daily 0900    magnesium oxide  400 mg Oral Daily 0900    montelukast  10 mg Oral Nightly (2100)    morphine SR  15 mg Oral Nightly (2100)    multivitamin with folic acid  1  tablet Oral Daily 0900    mycophenolate  1,000 mg Oral BID    olodateroL  2 puff Inhalation RT Daily    pantoprazole  40 mg Oral DAILY 0600    PARoxetine  40 mg Oral Daily 0900    pyridostigmine  60 mg Oral TID    QUEtiapine  12.5 mg Oral Nightly (2100)    tamsulosin  0.4 mg Oral Nightly (2100)    ticagrelor  90 mg Oral BID    topiramate  50 mg Oral BID    umeclidinium  62.5 mcg Inhalation RT Daily       PRN Meds:  acetaminophen, albuterol, loperamide, ondansetron, propylene glycoL     Continuous Infusions:      Vital Signs     Temp:  [97.3 F (36.3 C)-99.3 F (37.4 C)] 97.7 F (36.5 C)  Heart Rate:  [58-64] 58  Resp:  [16-18] 16  BP: (109-137)/(63-72) 137/71    Physical Exam     Constitutional:       General: Andre Olson is not in acute distress.     Appearance: Normal appearance. Andre Olson is not ill-appearing.   Eyes:      General: No scleral icterus.      Extraocular Movements: Extraocular movements intact.   Cardiovascular:      Rate and Rhythm: Regular rhythm. Bradycardia present.      Pulses: Normal pulses.           Posterior tibial pulses are 2+ on the right side and 2+ on the left side.      Heart sounds: Murmur heard.      No friction rub. No gallop.   Pulmonary:      Effort: Pulmonary effort is normal.      Breath sounds: Normal breath sounds.   Abdominal:      General: Bowel sounds are normal.      Palpations: Abdomen is soft.   Musculoskeletal:      Right lower leg: No edema.      Left lower leg: No edema.   Skin:     General: Skin is warm and dry.      Capillary Refill: Capillary refill takes less than 2 seconds.   Neurological:      Mental Status: Andre Olson is alert and oriented to person, place, and time.      Motor: Motor function is intact. No weakness.      Comments: Strength 4/5 in all ROM in bilateral LE.  Strength 4/5 in abduction, adduction, flexion and extension of bilateral UE.     Labs     Renal  Recent Labs     06/10/22  0455 06/11/22  0549 06/11/22  1843 06/12/22  0512   NA 140 137 135 140   K 3.8 3.9 3.6 3.8   CL 107 104 103 107   CO2 27 26 27 27    BUN 12 16 16 16    CREATININE 1.35* 1.51* 1.39* 1.44*   CALCIUM 8.7 8.7 8.3* 8.3*   MG 1.8 1.8  --  2.0   PHOS 3.2 3.1 2.9 3.2    CBC  Recent Labs     06/10/22  0455 06/11/22  0549 06/12/22  0512   WBC 6.7 6.0 6.9   HGB 13.4 13.9 13.6   HCT 37.7* 38.7 37.2*   PLT 224 227 211    Liver  Recent Labs     06/11/22  0549 06/11/22  1843 06/12/22  0512   ALBUMIN 3.8 4.0 3.8  Other Diagnostic Studies     Labs and imaging reviewed    Assessment & Plan     Andre Olson is a 72 y.o. 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      Cardiovascular:    # CAD, severe two-vessel disease  LHC showed LAD: proximal (90%) and mid (80%) stenosis; D1: 80% ostial stenosis . RCA: mid (90%) stenosis. RHC was wnl, w/ low L-sided filling pressures, preserved cardiac output, and normal pulmonary pressures.  CT Surgery consulted, Andre Olson not a surgical candidate. Will plan for PCI once renal function stable.   - Continue ASA 81mg , atorvastatin 40mg  daily, tic  - [facility], hopefully d/c tomorrow    #Orthostatic hypotension   + vitals 06/12/22, will rehydrate with bolus, likely an element of polypharmacy as Andre Olson is on extensive medication list  - Compression stockings,   - Education about [place]     Renal (Electrolytes, Acid/Base):  #AKI  Cr steadily rising since 1/5. 1.08 >1.15 > 1.3 > 1.51. 1L maintenance fluids given > 1.39 > 1.44.   - Orthostats positive today, likely some dehydration  - 1L normosol maintenance along with encouragement of oral hydration  - 1/2L bolus normosol  - [facility]     Neurology, Pain, Sedation:   #chronic pain  - morphine 15mg  qhs.  - Norco 7.5 - 325 PO TID.  - baclofen 10mg  BID  - lidocaine patch     #restless legs  - topiramate 50mg  BID.    # seronegativemyasthenia gravis  #autonomic neuropathy  - neurology consulted, appreciate recs              - obtain baseline respiratory parameters (NIF and FVC)  - continue Mestinon 60mg  TID + Cellcept 1gm BID.  - AVOID if possible: IV Magnesium, beta blockers, curare and related drugs, aminoglycosides, macrolides, FQN, CCB, quinine, and procainamide  - would discuss neuromuscular blocking agents with anesthesia during pre-op evaluation     Pulmonary:   #COPD  - Continue home olodaterol, tiotropium daily  - continue home montelukast 10mg  qhs  - albuterol PRN.  - Flonase BID.     #hx of DVT/PE (2018, 2019)  - not on AC     Gastrointestinal:  #Diarrhea  -PRN Imodium     Genitourinary:  #BPH  #hx Peyronie's disease  Andre Olson  reports being prescribed finasteride, but not taking at home.  - continue home tamsulosin 0.4mg  qhs.     Infectious Disease:   Antibiotics:  None     Cultures:  None     - No acute issues.     Endocrine:  # hx thyroid papillary cancer (s/p ablation)  # hypothyroidism  TSH 62.83 (elevated), T4 0.5 (low) on admission (1/5)  - continue home levothyroxine daily  - T3 dosing post-operatively.   - consulted endocrine, appreciate recs, will follow re-engage when procedure date is known.     Hematology/Oncology:  - No acute issues.      Rheumatology:  - No acute issues.     Musculoskeletal:  - No acute issues.     Psychiatry:  #MDD  #PTSD  - bupriopion 300mg  daily  - paroxetine 40mg  daily  -  quetiapine 25mg  qhs -> decreased to 12.5mg  qhs     F/E/N:  - Repalce electrolytes PRN.  - Nutrition: Cardiac Diet  - SLP: N/A.  - Nutrition eval: N/A.      Code Status: Full Code  DVT PPx: SQH TID.   GI PPx: PPI.   Andre Olson/OT: consulted.  - Andre Olson Recs: Recommendation: Home Andre Olson (pending further mobility)  Equipment Recommended: Andre Olson already has needed DME  - OT Recs: Recommendation: Anticipate no further OT needed after discharge (Pending orthostatic hypotension management and further mobility practice)  Equipment Recommendations: None  - SLP Recs:      SignedMarveen Reeks  CVICU Resident  06/12/2022, 6:42 AM

## 2022-06-13 LAB — CBC
Hematocrit: 37.9 % (ref 38.5–50.0)
Hemoglobin: 13.4 g/dL (ref 13.2–17.1)
MCH: 32.6 pg (ref 27.0–33.0)
MCHC: 35.4 g/dL (ref 32.0–36.0)
MCV: 91.9 fL (ref 80.0–100.0)
MPV: 7.3 fL (ref 7.5–11.5)
Platelets: 213 10*3/uL (ref 140–400)
RBC: 4.12 10*6/uL (ref 4.20–5.80)
RDW: 12.8 % (ref 11.0–15.0)
WBC: 6.1 10*3/uL (ref 3.8–10.8)

## 2022-06-13 LAB — RENAL FUNCTION PANEL W/EGFR
Albumin: 3.8 g/dL (ref 3.5–5.7)
Anion Gap: 8 mmol/L (ref 3–16)
BUN: 16 mg/dL (ref 7–25)
CO2: 21 mmol/L (ref 21–33)
Calcium: 8.1 mg/dL (ref 8.6–10.3)
Chloride: 111 mmol/L (ref 98–110)
Creatinine: 1.3 mg/dL (ref 0.60–1.30)
EGFR: 58
Glucose: 86 mg/dL (ref 70–100)
Osmolality, Calculated: 290 mOsm/kg (ref 278–305)
Phosphorus: 3.2 mg/dL (ref 2.1–4.5)
Potassium: 3.7 mmol/L (ref 3.5–5.3)
Sodium: 140 mmol/L (ref 133–146)

## 2022-06-13 LAB — MAGNESIUM: Magnesium: 1.8 mg/dL (ref 1.5–2.5)

## 2022-06-13 MED ORDER — tiotropium-olodateroL (STIOLO RESPIMAT) 2.5-2.5 mcg/actuation Mist
2.5-2.5 | Freq: Every day | RESPIRATORY_TRACT | 1 refills | 30.00000 days | Status: AC
Start: 2022-06-13 — End: ?

## 2022-06-13 MED ORDER — baclofen (LIORESAL) 10 MG tablet
10 | ORAL_TABLET | Freq: Every day | ORAL | 1 refills | Status: AC | PRN
Start: 2022-06-13 — End: ?

## 2022-06-13 MED ORDER — PARoxetine (PAXIL) 40 MG tablet
40 | ORAL_TABLET | Freq: Every day | ORAL | 1 refills | Status: AC
Start: 2022-06-13 — End: ?

## 2022-06-13 MED ORDER — fluticasone propionate (FLONASE) 50 mcg/actuation nasal spray
50 | Freq: Two times a day (BID) | NASAL | 0 refills | Status: AC
Start: 2022-06-13 — End: ?

## 2022-06-13 MED ORDER — levothyroxine (SYNTHROID) 175 MCG tablet
175 | ORAL_TABLET | Freq: Every day | ORAL | 0 refills | Status: AC
Start: 2022-06-13 — End: ?

## 2022-06-13 MED ORDER — lidocaine (LIDODERM) 5 %
5 | MEDICATED_PATCH | Freq: Every day | TOPICAL | 0 refills | 30.00000 days | Status: AC
Start: 2022-06-13 — End: ?

## 2022-06-13 MED ORDER — HYDROcodone-acetaminophen (NORCO) 7.5-325 mg per tablet
7.5-325 | Freq: Three times a day (TID) | ORAL | 0.00 refills | 15.50000 days | Status: AC | PRN
Start: 2022-06-13 — End: 2022-06-17

## 2022-06-13 MED ORDER — ticagrelor (BRILINTA) 90 mg Tab tablet
90 | ORAL_TABLET | Freq: Two times a day (BID) | ORAL | 1 refills | Status: AC
Start: 2022-06-13 — End: ?

## 2022-06-13 MED ORDER — QUEtiapine (SEROQUEL) 25 MG tablet
25 | ORAL_TABLET | Freq: Every evening | ORAL | 1 refills | Status: AC
Start: 2022-06-13 — End: ?

## 2022-06-13 MED ORDER — propylene glycoL (SYSTANE) 0.6 % Drop
0.6 | Freq: Three times a day (TID) | OPHTHALMIC | 1 refills | 30.00000 days | Status: AC | PRN
Start: 2022-06-13 — End: ?

## 2022-06-13 MED ORDER — buPROPion SR (WELLBUTRIN SR) 150 MG tablet
150 | ORAL_TABLET | Freq: Every day | ORAL | 1 refills | Status: AC
Start: 2022-06-13 — End: ?

## 2022-06-13 MED ORDER — calcium carbonate 1250 mg tablet, 500 mg elemental calcium, (OS-CAL) 500 mg calcium (1,250 mg) tablet
500 | ORAL_TABLET | Freq: Two times a day (BID) | ORAL | 0 refills | Status: AC
Start: 2022-06-13 — End: ?

## 2022-06-13 MED ORDER — aspirin 81 MG EC tablet
81 | ORAL_TABLET | Freq: Every day | ORAL | 0 refills | 30.00000 days | Status: AC
Start: 2022-06-13 — End: ?

## 2022-06-13 MED ORDER — potassium chloride (KLOR-CON M20) CR tablet 20 mEq
20 | Freq: Once | ORAL | Status: AC
Start: 2022-06-13 — End: 2022-06-13
  Administered 2022-06-13: 13:00:00 20 meq via ORAL

## 2022-06-13 MED ORDER — morphine SR (MS CONTIN) 15 MG 12 hr tablet
15 | ORAL_TABLET | Freq: Every evening | ORAL | 0 refills | Status: AC
Start: 2022-06-13 — End: 2022-07-13

## 2022-06-13 MED ORDER — cholecalciferol, vitamin D3, 1000 units tablet
1000 | ORAL_TABLET | Freq: Every day | ORAL | 0 refills | Status: AC
Start: 2022-06-13 — End: ?

## 2022-06-13 MED ORDER — omeprazole (PRILOSEC) 20 MG capsule
20 | ORAL_CAPSULE | Freq: Two times a day (BID) | ORAL | 0 refills | Status: AC
Start: 2022-06-13 — End: ?

## 2022-06-13 MED ORDER — albuterol 90 mcg/actuation Inhl inhaler
90 | Freq: Four times a day (QID) | RESPIRATORY_TRACT | 0 refills | Status: AC | PRN
Start: 2022-06-13 — End: ?

## 2022-06-13 MED ORDER — pyridostigmine (MESTINON) 60 mg tablet
60 | ORAL_TABLET | Freq: Two times a day (BID) | ORAL | 0 refills | Status: AC
Start: 2022-06-13 — End: ?

## 2022-06-13 MED ORDER — mycophenolate (CELLCEPT) 500 mg tablet
500 | ORAL_TABLET | Freq: Two times a day (BID) | ORAL | 1 refills | Status: AC
Start: 2022-06-13 — End: ?

## 2022-06-13 MED ORDER — naloxone (NARCAN) 4 mg/actuation Spry
4 | NASAL | 1 refills | Status: AC | PRN
Start: 2022-06-13 — End: ?

## 2022-06-13 MED ORDER — topiramate (TOPAMAX) 50 MG tablet
50 | ORAL_TABLET | Freq: Two times a day (BID) | ORAL | 0 refills | Status: AC
Start: 2022-06-13 — End: ?

## 2022-06-13 MED ORDER — pyridostigmine (MESTINON) tablet 60 mg
60 | Freq: Two times a day (BID) | ORAL
Start: 2022-06-13 — End: 2022-06-14

## 2022-06-13 MED FILL — OYSTER SHELL CALCIUM 500  500 MG CALCIUM (1,250 MG) TABLET: 500 500 mg calcium (1,250 mg) | ORAL | Qty: 1

## 2022-06-13 MED FILL — TOPIRAMATE 50 MG TABLET: 50 50 MG | ORAL | Qty: 1

## 2022-06-13 MED FILL — LIDODERM 5 % TOPICAL PATCH: 5 5 % | TOPICAL | Qty: 2

## 2022-06-13 MED FILL — CHOLECALCIFEROL (VITAMIN D3) 25 MCG (1,000 UNIT) TABLET: 1000 1000 units | ORAL | Qty: 1

## 2022-06-13 MED FILL — HYDROCODONE 7.5 MG-ACETAMINOPHEN 325 MG TABLET: 7.5-325 7.5-325 mg | ORAL | Qty: 1

## 2022-06-13 MED FILL — BRILINTA 90 MG TABLET: 90 90 mg | ORAL | Qty: 1

## 2022-06-13 MED FILL — ASPIRIN 81 MG TABLET,DELAYED RELEASE: 81 81 MG | ORAL | Qty: 1

## 2022-06-13 MED FILL — THERA 400 MCG TABLET: 400 400 mcg | ORAL | Qty: 1

## 2022-06-13 MED FILL — CELLCEPT 500 MG TABLET: 500 500 mg | ORAL | Qty: 2

## 2022-06-13 MED FILL — POTASSIUM CHLORIDE ER 20 MEQ TABLET,EXTENDED RELEASE(PART/CRYST): 20 20 MEQ | ORAL | Qty: 1

## 2022-06-13 MED FILL — HEPARIN (PORCINE) 5,000 UNIT/ML INJECTION SOLUTION: 5000 5,000 unit/mL | INTRAMUSCULAR | Qty: 1

## 2022-06-13 MED FILL — LEVOTHYROXINE 75 MCG TABLET: 75 75 MCG | ORAL | Qty: 1

## 2022-06-13 MED FILL — MORPHINE ER 15 MG TABLET,EXTENDED RELEASE: 15 15 MG | ORAL | Qty: 1

## 2022-06-13 MED FILL — PYRIDOSTIGMINE BROMIDE 60 MG TABLET: 60 60 mg | ORAL | Qty: 1

## 2022-06-13 MED FILL — MAGNESIUM OXIDE 400 MG (241.3 MG MAGNESIUM) TABLET: 400 400 mg | ORAL | Qty: 1

## 2022-06-13 MED FILL — BUPROPION HCL XL 300 MG 24 HR TABLET, EXTENDED RELEASE: 300 300 MG | ORAL | Qty: 1

## 2022-06-13 MED FILL — TAMSULOSIN 0.4 MG CAPSULE: 0.4 0.4 mg | ORAL | Qty: 1

## 2022-06-13 MED FILL — MONTELUKAST 10 MG TABLET: 10 10 mg | ORAL | Qty: 1

## 2022-06-13 MED FILL — ATORVASTATIN 40 MG TABLET: 40 40 MG | ORAL | Qty: 1

## 2022-06-13 MED FILL — PANTOPRAZOLE 40 MG TABLET,DELAYED RELEASE: 40 40 MG | ORAL | Qty: 1

## 2022-06-13 MED FILL — QUETIAPINE 12.5 MG DOSE: 12.5 12.5 MG | ORAL | Qty: 1

## 2022-06-13 MED FILL — BACLOFEN 5 MG TABLET: 5 5 mg | ORAL | Qty: 2

## 2022-06-13 MED FILL — VITAMIN B-12  1,000 MCG TABLET: 1000 1000 MCG | ORAL | Qty: 1

## 2022-06-13 MED FILL — PAROXETINE 40 MG TABLET: 40 40 MG | ORAL | Qty: 1

## 2022-06-13 NOTE — Unmapped (Signed)
Subspecialty Follow-up Appointment Chart Note    Service: Endocrinology    Patient is scheduled for follow up with Dr Myrlene Broker from endocrinology  Appointment details: [facility] endocrine clinic, 4th floor, 06/27/22 at 9:00 am     Andre Mins, MD  06/13/2022  10:55 AM

## 2022-06-13 NOTE — Unmapped (Signed)
Patient given discharge paperwork. Denies questions. Patients family/ride present and states he is going to [place] now to pick up medications.

## 2022-06-13 NOTE — Unmapped (Incomplete)
[facility]  CVICU Progress Note    Room: [phone], LOS: 6    Background / Hosp course to date:     Andre Olson is a 72 y.o. 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     LHC showed LAD: proximal (90%) and mid (80%) stenosis; D1: 80% ostial stenosis . RCA: mid (90%) stenosis. Pt admitted for further CT surgery evaluation.      Interval Hx / Subjective     Significant events/labs/imaging/hemodynamics over past 24h:   - Yesterday: Plan for outpatient PCI, received 1.5 L IVF due to orthostatic hypotension  - Overnight: no acute events    Brief Daily Plan:  - orthostatics to assess if we can add imdur      Medications     Reviewed    Vital Signs     Temp:  [97.5 F (36.4 C)-98.4 F (36.9 C)] 97.5 F (36.4 C)  Heart Rate:  [53-67] 56  Resp:  [16-18] 16  BP: (85-135)/(63-79) 133/68    Physical Exam     Constitutional:       General: He is not in acute distress.     Appearance: Normal appearance. He is not ill-appearing.   Eyes:      General: No scleral icterus.     Extraocular Movements: Extraocular movements intact.   Cardiovascular:      Rate and Rhythm: Regular rhythm. Bradycardia present.      Pulses: Normal pulses.           Posterior tibial pulses are 2+ on the right side and 2+ on the left side.      Heart sounds: Murmur heard.      No friction rub. No gallop.   Pulmonary:      Effort: Pulmonary effort is normal.      Breath sounds: Normal breath sounds.   Abdominal:      General: Bowel sounds are normal.      Palpations: Abdomen is soft.   Musculoskeletal:      Right lower leg: No edema.      Left lower leg: No edema.   Skin:     General: Skin is warm and dry.      Capillary Refill:  Capillary refill takes less than 2 seconds.   Neurological:      Mental Status: He is alert and oriented to person, place, and time.      Motor: Motor function is intact. No weakness.      Comments: Strength 4/5 in all ROM in bilateral LE.  Strength 4/5 in abduction, adduction, flexion and extension of bilateral UE.     Labs     Reviewed    Other Diagnostic Studies     Labs and imaging reviewed    Assessment & Plan     Andre Olson is a 72 y.o. 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      Cardiovascular:    #  CAD, severe two-vessel disease  LHC showed LAD: proximal (90%) and mid (80%) stenosis; D1: 80% ostial stenosis . RCA: mid (90%) stenosis. RHC was wnl, w/ low L-sided filling pressures, preserved cardiac output, and normal pulmonary pressures. CT Surgery consulted, patient not a surgical candidate. Will plan for PCI outpatient once renal function stable.   - Continue ASA 81mg , brilinta 90 mg BID, atorvastatin 40mg  daily, tic    #AKI  Baseline Cr ~1.1, elevated to 1.4 this admission. IVF given yesterday  - monitor    #chronic pain: morphine 15mg  qhs, Norco 7.5 - 325 PO TID., baclofen 10mg  BID, lidocaine patch     #restless legs: topiramate 50mg  BID.    # seronegativemyasthenia gravis  #autonomic neuropathy  - neurology consulted, appreciate recs              - obtain baseline respiratory parameters (NIF and FVC)  - continue Mestinon 60mg  TID + Cellcept 1gm BID.  - AVOID if possible: IV Magnesium, beta blockers, curare and related drugs, aminoglycosides, macrolides, FQN, CCB, quinine, and procainamide  - would discuss neuromuscular blocking agents with anesthesia during pre-op evaluation     #COPD  -  Continue home olodaterol, tiotropium daily, montelukast 10mg  qhs, albuterol PRN, Flonase BID.     #hx of DVT/PE (2018, 2019)  - not on [facility]     #Diarrhea  -PRN Imodium     #BPH  #hx Peyronie's disease  Pt reports being prescribed finasteride, but not taking at home.  - continue home tamsulosin 0.4mg  qhs.     # hx thyroid papillary cancer (s/p ablation)  # hypothyroidism  TSH 62.83 (elevated), T4 0.5 (low) on admission (1/5) as he hadn't been taking her home medication  - continue home levothyroxine daily  - consulted endocrine, appreciate recs, will follow re-engage when procedure date is known.     #MDD  #PTSD  - bupriopion 300mg  daily, paroxetine 40mg  daily, quetiapine 25mg  qhs -> decreased to 12.5mg  qhs     F/E/N:  - Repalce electrolytes PRN.  - Nutrition: Cardiac Diet  - SLP: N/A.  - Nutrition eval: N/A.    Code Status: Full Code  DVT PPx: SQH TID.   GI PPx: PPI.   PT/OT: consulted.  - PT Recs: Recommendation: Home PT (pending further mobility)  Equipment Recommended: Patient already has needed DME  - OT Recs: Recommendation: Outpatient OT  Equipment Recommendations: Patient already has needed DME  Patient already has needed DME: shower chair  - SLP Recs:      SignedCharlton Amor  CVICU Resident  06/13/2022, 12:13 AM

## 2022-06-13 NOTE — Unmapped (Signed)
Physical Therapy  Treatment    Name: Andre Olson  DOB: 1951-05-28  Attending Physician: Bufford Spikes, MD  Admission Diagnosis: Angina  Date: 06/13/2022  Room: 0981/X9147  Reviewed Pertinent hospital course: Yes    Hospital Course PT/OT: Pt 72 y.o male who presents for LHC/RHC 2/2 worsening dyspnea with concurrent chest pain. 1/5 RHC: WNL, preserved cardiac output; LHC: proximal + mid LAD stenosis, mid RCA stenosis, ostial 90% diagonal disease; Cardiac surgery consult with CABG recommendations pending following workup. 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 He needs increased time for positional changes and reports increased dizziness that doesn't fully resolve. Pt returned to supine and BP taken. With next stand he reports resolve of symptoms and ambulates 200' with SBA and use of the RW. Pt does have decreased activity tolerance and SOB intermittently throughout. SpO2 reads 92% after ambulation. Pt reports feeling unsteady and demonstrates a noticeable tremor at rest and with intention. Orthostatics listed below. PT will continue to progress as tolerated per POC.     Supine: 150/83 (98) mmHg  Seated: 128/77 (90) mmHg  Standing: 100/64 (74) mmHg  Seated post ambulation: 126/71 (88) mmHg         Recommendation  Recommendation: Home PT (pending further mobility)  Equipment Recommended: Patient already has needed DME         AM-PAC 6 Clicks Basic Mobility Inpatient Short Form: PT 6 Clicks Score: 21     Mobility Recommendations for Staff  Patient ability: Patient  transfers to chair/ bedside commode (watch BP/dizziness!)  Assist needed: with 1 person assist    Cognition  Overall Cognitive Status: Within Functional Limits  Cognitive Assessment: Arousal/ Alertness;Orientation Level;Behavior;Following Commands;Safety Judgment  Arousal/Alertness: Alert  Orientation Level:  (not formally assessed)  Behavior: Appropriate;Cooperative  Following Commands: Follows all commands and directions without difficulty  Safety Judgment: Decreased safety awareness  Insight: Demonstrated decreased insight into limitations and abilities to complete ADLs safely     Pain  Pain Score:  (mentioned but didn't give a number)  Pain Location: Neck  Therapist reported pain to: RN aware     Mobility  Bed Mobility  Rolling: Independent  Supine to Sit: Modified independent;head of bed elevated;use of handrail;towards the left  Sit to Supine: towards the right;head of bed elevated;Modified independent;use of handrail  Transfers  Sit to Stand: Stand-by assistance;up to assistive device  Sit to Stand Assistive Device: Rolling walker  Stand to Sit: Stand-by assistance;with assistive device  Stand to Sit Assistive Device: Rolling walker  Gait  Distance: 200  Level of Assistance: Stand By assistance  Assistive Device: Rolling walker  Gait Characteristics: decreased cadence;Unsteady;swing-through pattern;No LOB  Balance  Sitting - Static: Independent  Sitting - Dynamic: Supervision  Standing - Static: Stand-by assistance  Standing - Dynamic: Stand-by assistance    Gait belt used: Yes    Outcome Measures       Exercise                 Position after Treatment and Safety Handoff  Position after treatment and safety handoff  Position after therapy session: Bed  Details: RN notified;Call light/ needs within reach  Alarms: Other (pt left the way he was found)    Goals  Goals Met: Gait    Collaborated with: Patient  Patient Stated Goal: to increase activity  Goals to be met by: 06/16/22  Patient will transition from  supine to sit: Independent  Patient will transition from sit to supine: Independent  Patient will transfer from sit to stand: Modified Independent  Patient will transfer bed/chair: Supervision  Patient will ambulate: Modified Independent, distance (in feet)  Distance (in feet): >250  Long-term goal to be met by: 06/23/22  Long Term Goal : Patient will tolerate stair assessment    Patient/Family Education  Educated patient on the role of physical therapy and plan of care and fall prevention strategies, including use of call light; patient verbalized understanding. Handout(s) issued: NA.    Plan  Plan  Treatment/Interventions: Endurance training, LE strengthening/ROM, Patient/family training, Equipment eval/education, Gait training, Continued evaluation, Stair Training, Neuromuscular Reeducation, Therapeutic Activity, Therapeutic Exercise  PT Frequency: minimum 3x/week    The plan of care and recommendations assesses the patient's and/or caregiver's readiness, willingness, and ability to provide or support functional mobility and ADL tasks as needed upon discharge.      Time  Start Time: 1447  Stop Time: 1516  Time Calculation (min): 29 min    Charges     $Gait/Mobility: 23-37 mins             Problem List  Patient Active Problem List   Diagnosis    Stenosis, cervical spine    Syncope    Myasthenia gravis with exacerbation, adult form (CMS-HCC)    CAD, multiple vessel        Past Medical History  Past Medical History:   Diagnosis Date    Benign prostate hyperplasia     Cancer (CMS-HCC)     COPD (chronic obstructive pulmonary disease) (CMS-HCC)     Coronary artery disease     Depression     GERD (gastroesophageal reflux disease)     Hypertension     Hypothyroid     Myasthenia gravis (CMS-HCC)     Orthostatic hypotension         Past Surgical History  Past Surgical History:   Procedure Laterality Date    LEFT AND RIGHT HEART CATHETERIZATION N/A 06/07/2022    Procedure: Left and Right Heart Cath;  Surgeon: Collier Flowers, MD;   Location: [facility];  Service: Cath;  Laterality: N/A;    TOTAL THYROIDECTOMY  March 1995    ulnar nerve release

## 2022-06-13 NOTE — Unmapped (Addendum)
Andre Olson,  Here are your hospital discharge instructions:  --> You were hospitalized for: a procedure called coronary angiogram due to ongoing chest pain. This showed that you have blockages in the vessels around your heart that need to be fixed by stents. You will follow-up at the [facility] for further management of these. We made some medication changes as seen in this paperwork to help with these. If you have chest pain at home that does not improve with rest, please return to the ED.     --> Please review all your medications that are attached to your discharge paperwork. These are all important medications and it is important for you to take them as prescribed.    --> Other Instructions:   1) Follow-up with Dr. Rica Mote on 06/20/2022 at the [facility] for further planning to place stents in your heart vessels    2) Follow-up with your endocrinologist at [facility] endocrine clinic, 4th floor, 06/27/22 at 9:00 am)   3) Follow-up with your neurologist at the [facility] for management of your myasthenia gravis medications    Thank you for trusting [place] with your care. We wish you well.   Thank you, Cardiovascular ICU Team

## 2022-06-13 NOTE — Unmapped (Signed)
    Case Manager/Social Worker Discharge Summary     Patient name: Andre Olson                                        Patient MRN: [id number]  DOB: 01/21/1951                              Age: 72 y.o.              Gender: male  Patient emergency contact: Extended Emergency Contact Information  Primary Emergency Contact: [place] Phone: [phone]  Mobile Phone: [phone]  Relation: Significant other      Attending provider: Bufford Spikes, MD  Primary care physician: Margaretha Glassing, MD    The MD has indicated that the patient is ready for discharge.   Transfer Mode/Level of Care: Family    The plan has been reviewed:     Patient/Family Informed of Discharge Plan: Yes    Plan Reviewed With Patient, Family, or Significant Other: Yes    Patient and or family are aware and in agreement with the discharge plan: Yes             Plan reviewed with MD and other members of the health care team: Yes  Care Plan Completed: Yes    No further CM/SW needs.    This plan has been reviewed with the multi-disciplinary team.     Post-Discharge Goals    Patient's Post-Discharge goals: Discharge home safely    Post Acute Care Provider Information:    Information systems manager at Discharge  Community Services at [facility] post discharge: [place] Name/Phone # post discharge: [place] Types at Discharge: PT/OT/SLP    DC Summary and COC have been faxed to facility.     Hardin Negus, MSW [facility]  Inpatient Social Worker  Care Coordinator   [phone]

## 2022-06-13 NOTE — Unmapped (Signed)
CCA has sent HHC orders and Discharge Summary via epic to [facility] [phone] as well as called to inform them that the orders has been sent to epic. States when the patient will be discharged.      Adriana Barnett  Discharge Planning Assistant  [phone]

## 2022-06-13 NOTE — Unmapped (Signed)
  Case Management/Social Work Department  Progress Note  Patient Information     Patient Name: Andre Olson  MRN: [id number]  Hospital day: 6  Inpatient/Observation:  Inpatient   Level of Care:  Team A [id number]  Admit date:  06/07/2022  Admission diagnosis: Angina    PMH:  has a past medical history of Benign prostate hyperplasia, Cancer (CMS-HCC), COPD (chronic obstructive pulmonary disease) (CMS-HCC), Coronary artery disease, Depression, GERD (gastroesophageal reflux disease), Hypertension, Hypothyroid, Myasthenia gravis (CMS-HCC), and Orthostatic hypotension.    PCP:  Margaretha Glassing, MD    Home Pharmacy:    [facility]  [place] [address]  Phone: [phone]     [place] [address]  Phone: [place] [place]  [place]  [place] [address]  Phone: [phone]         Medical Insurance Coverage:  Payor: [phone] MANAGED MEDICARE / Plan: HUMANA GOLD PLUS MEDICARE / Product Type: Medicare Mngd Care /     Other Pertinent Information     SW completed chart review and rounded with the team. The team is working on some mediactions but if patient is able to walk normally, patient will d/c today.     [facility] has accepted patient for home PT. [phone]  Discharge Plan     Anticipated discharge plan:  Home with Home Care      Anticipated discharge date:  1/11-1/12     CM/SW will continue to follow and remain available for discharge planning needs.      Hardin Negus, MSW [facility]  Inpatient Social Worker  Care Coordinator   [phone]

## 2022-06-13 NOTE — Unmapped (Signed)
REFERRAL FOR HOME HEALTH SERVICES FORM     Patient name: Andre Olson  Patient MRN: [id number]  DOB: 11-08-1950  Age: 72 y.o.  Gender: male  SSN: [id number]  Address: [address] [place] [address]     Phone number: [phone] (home)    Patient emergency contact: Extended Emergency Contact Information  Primary Emergency Contact: [place]  Home Phone: [phone]  Mobile Phone: [phone]  Relation: Significant other    Date of admission: 06/07/2022  Date of discharge: 06/13/2022  Attending provider: Corrie Dandy, MD  Primary care physician: Marolyn Haller, MD    Code status: Full Code  Allergies:   Allergies   Allergen Reactions    Beta-Blockers (Beta-Adrenergic Blocking Agts)      Avoid as possible with hx myasthenia gravis.    Calcium Channel Blocking Agent Diltiazem Analogues      Avoid as possible with hx myasthenia gravis.    Dilaudid  [Hydromorphone (Bulk)]      hallucinations    Lamotrigine Hives    Magnesium      Avoid IV magnesium if possible with hx myasthenia gravis.       Soil scientist MANAGED MEDICARE/HUMANA GOLD PLUS MEDICARE Phone: --    Subscriber: [place], [place] Subscriber#: [id number]    Group#: [id number] Precert#: --          Diagnoses Present on Admission   Primary Diagnosis: <principal problem not specified>  Discharge Diagnosis : There are no hospital problems to display for this patient.    Prognosis: fair  Rehabilitation potential: good    Diet     Diet/Nutrition Orders    Diet Regular(7)     Frequency: Effective Now     Number of Occurrences: Until Specified     Order Questions:      Suicide/Behavior Risk Modification? No        As listed above    Services Required   Physical Therapy: Plan  Treatment/Interventions: Endurance training, LE strengthening/ROM, Patient/family training, Equipment eval/education, Gait training, Continued evaluation, Stair Training, Neuromuscular  Reeducation, Therapeutic Activity, Therapeutic Exercise  PT Frequency: minimum 3x/week  Recommendation  Recommendation: Home PT (pending further mobility)  Equipment Recommended: Patient already has needed DME  Occupational Therapy: Plan  Treatment Interventions: ADL retraining, Activity Tolerance training, Energy Conservation, IADL retraining, Equipment eval/education, Patient/Family training, Functional transfer training, Therapeutic Activity, Compensatory technique education, Continued evaluation  Progress: Progressing toward goals  OT Frequency: minimum 2x/week  Recommendation  Recommendation: Outpatient OT  Equipment Recommendations: Patient already has needed DME  Patient already has needed DME: shower chair    Weight bearing status: full    Needs 24 hour supervision due to cognitive impairment: No    Discharge Medications   Medications:  Current Discharge Medication List        START taking these medications    Details   albuterol 90 mcg/actuation Inhl inhaler Inhale 2 puffs into the lungs every 6 hours as needed for Wheezing or Shortness of Breath.  Qty: 18 g, Refills: 0    Comments: Formulary Preferred Albuterol - Ventolin/Proventil/Proair/Albuterol      aspirin 81 MG EC tablet Take 1 tablet (81 mg total) by mouth daily with breakfast.  Qty: 30 tablet, Refills: 0      calcium carbonate 1250 mg tablet, 500 mg elemental calcium, (OS-CAL) 500 mg  calcium (1,250 mg) tablet Take 1 tablet (1,250 mg total) by mouth 2 times a day.  Qty: 30 tablet, Refills: 0      cholecalciferol, vitamin D3, 1000 units tablet Take 1 tablet (1,000 Units total) by mouth daily.  Qty: 30 tablet, Refills: 0      fluticasone propionate (FLONASE) 50 mcg/actuation nasal spray Use 1 spray into each nostril in the morning and at bedtime.  Qty: 16 g, Refills: 0      lidocaine (LIDODERM) 5 % Place 2 patches onto the skin daily. Apply patch for 12 hours and then remove patch and leave off for 12 hours.  Qty: 30 patch, Refills: 0      morphine SR  (MS CONTIN) 15 MG 12 hr tablet Take 1 tablet (15 mg total) by mouth at bedtime for 30 days. Does not need refills  Qty: 30 tablet, Refills: 0    Associated Diagnoses: Stenosis, cervical spine; Myasthenia gravis with exacerbation, adult form (CMS-HCC)      mycophenolate (CELLCEPT) 500 mg tablet Take 2 tablets (1,000 mg total) by mouth 2 times a day.  Qty: 60 tablet, Refills: 1      PARoxetine (PAXIL) 40 MG tablet Take 0.5 tablets (20 mg total) by mouth daily.  Qty: 30 tablet, Refills: 1      propylene glycoL (SYSTANE) 0.6 % Drop Place 1 drop into both eyes every 8 hours as needed (dry eyes).  Qty: 20 mL, Refills: 1      QUEtiapine (SEROQUEL) 25 MG tablet Take 0.5 tablets (12.5 mg total) by mouth at bedtime.  Qty: 30 tablet, Refills: 1      ticagrelor (BRILINTA) 90 mg Tab tablet Take 1 tablet (90 mg total) by mouth 2 times a day.  Qty: 60 tablet, Refills: 1      topiramate (TOPAMAX) 50 MG tablet Take 1 tablet (50 mg total) by mouth in the morning and at bedtime.  Qty: 60 tablet, Refills: 0      naloxone (NARCAN) 4 mg/actuation Spry Apply 1 spray in one nostril if needed. Call 911. May repeat dose in other nostril if no response in 3 minutes.  Qty: 2 each, Refills: 1      tiotropium-olodateroL (STIOLO RESPIMAT) 2.5-2.5 mcg/actuation Mist Inhale 2 puffs into the lungs daily.  Qty: 4 g, Refills: 1           CONTINUE these medications which have CHANGED    Details   baclofen (LIORESAL) 10 MG tablet Take 2 tablets (20 mg total) by mouth daily as needed (for muscle spasm).  Qty: 30 tablet, Refills: 1      buPROPion SR (WELLBUTRIN SR) 150 MG tablet Take 1 tablet (150 mg total) by mouth daily.  Qty: 30 tablet, Refills: 1      levothyroxine (SYNTHROID) 175 MCG tablet Take 1 tablet (175 mcg total) by mouth daily.  Qty: 30 tablet, Refills: 0      omeprazole (PRILOSEC) 20 MG capsule Take 1 capsule (20 mg total) by mouth in the morning and at bedtime.  Qty: 60 capsule, Refills: 0      pyridostigmine (MESTINON) 60 mg tablet Take 1  tablet (60 mg total) by mouth 2 times a day.  Qty: 90 tablet, Refills: 0      HYDROcodone-acetaminophen (NORCO) 7.5-325 mg per tablet Take 1 tablet by mouth every 8 hours as needed for Pain for up to 6 doses.    Associated Diagnoses: Myasthenia gravis (CMS-HCC); Stenosis, cervical spine  CONTINUE these medications which have NOT CHANGED    Details   atorvastatin (LIPITOR) 40 MG tablet Take by mouth.      cyanocobalamin (VITAMIN B-12) 1000 MCG tablet Take 1 tablet (1,000 mcg total) by mouth daily.      multivitamin capsule Take 1 capsule by mouth daily.           STOP taking these medications       azaTHIOprine (IMURAN) 100 mg tablet Comments:   Reason for Stopping:         azelastine (ASTELIN) 137 mcg (0.1 %) nasal spray Comments:   Reason for Stopping:         budesonide-formoterol (SYMBICORT) 80-4.5 mcg/actuation inhaler Comments:   Reason for Stopping:         ipratropium (ATROVENT) 0.03 % nasal spray Comments:   Reason for Stopping:         montelukast (SINGULAIR) 10 mg tablet Comments:   Reason for Stopping:         ondansetron (ZOFRAN) 4 MG tablet Comments:   Reason for Stopping:         tamsulosin (FLOMAX) 0.4 mg Cp24 Comments:   Reason for Stopping:         buPROPion HCL (WELLBUTRIN XL) 300 MG 24 hr tablet Comments:   Reason for Stopping:         clopidogrel (PLAVIX) 75 mg tablet Comments:   Reason for Stopping:         divalproex (DEPAKOTE) 125 MG DELAYED RELEASE tablet Comments:   Reason for Stopping:         finasteride (PROSCAR) 5 mg tablet Comments:   Reason for Stopping:         HYDROcodone-acetaminophen (NORCO) 5-325 mg per tablet Comments:   Reason for Stopping:         ipratropium-albuterol (DUO-NEB) 0.5 mg-3 mg(2.5 mg base)/3 mL nebulizer solution Comments:   Reason for Stopping:         midodrine (PROAMATINE) 10 MG tablet Comments:   Reason for Stopping:         ondansetron (ZOFRAN) 4 mg/2 mL Soln injection Comments:   Reason for Stopping:         pantoprazole (PROTONIX) 40 MG tablet  Comments:   Reason for Stopping:         pravastatin (PRAVACHOL) 40 MG tablet Comments:   Reason for Stopping:         rivaroxaban (XARELTO) 20 mg Tab Comments:   Reason for Stopping:         UNABLE TO FIND Comments:   Reason for Stopping:         venlafaxine (EFFEXOR) 75 MG tablet Comments:   Reason for Stopping:                   Discharge Specific Orders   Discharge specific orders:  None required  Isolation     Patient Isolation Status       None to display            Vitals   Patient Vitals for the past 4 hrs:   BP Temp Temp src Pulse Resp SpO2   06/13/22 1201 122/67 97.5 F (36.4 C) Oral 61 18 97 %   06/13/22 1100 -- -- -- 58 -- 97 %       Equipment/Supplies   Shower chair that he already has  The patient will need the following 4 tests completed on: 05/29/2022  1. IV pole 3. IV pump, single   2. IV pole 4. Compression stockings           Diagnosis:            Authorizing Provider: Collier Flowers, MD, Corrie Dandy, MD, Hulda Marin, MD     Ordering Physician: NPI  Corrie Dandy, MD]    Physician Certification   Further, I certify that my clinical findings support that this patient is homebound (i.e. absences from home require considerable and taxing effort and are for medical reasons or religious services or infrequently or short duration when for other reasons) due to deconditioning it would be a taxing effort to receive outpatient services.    My signature below is to certify that this patient is under my care and that I, or nurse practitioner, or a physician assistant working with me, had a face-to-face encounter with this is patient on: 06/13/2022     Follow-up Appointments and [facility] Discharge Physician Name   No future appointments.  [facility]  [address] [place] [address]  [phone]          Discharging Physician Signature and Credentials   Discharging Physician: Electronically signed by Charlton Amor  06/13/2022, 1:34 PM    Physician to  follow up Information   PCP: Marolyn Haller, MD  PCP address: [address] / [place] [address]  PCP phone number: [phone]  PCP fax number: [phone]    If PCP is not following patient, type physician contact information here:           Patient will be followed by PCP    Discharge Planner and Credentials     Provider/Company Name and Contact Number:           Community Services at Discharge  Community Services at [facility] post discharge: Home Health Care  Home Health Care Name/Phone # post discharge: [facility] [phone]  Home Health Services Types at Discharge: PT/OT/SLP    Discharge Planner Name and Telephone Number: Nancy Marus [phone]

## 2022-06-13 NOTE — Unmapped (Signed)
[facility]  Department of Internal Medicine  Inpatient Discharge Summary    Patient: Andre Olson   MRN: [id number]   CSN: [id number]    Date of Admission: 06/07/2022  Date of Discharge: 06/13/2022  Attending Physician: Bufford Spikes, MD     Diagnoses Present on Admission     Past Medical History:   Diagnosis Date    Benign prostate hyperplasia     Cancer (CMS-HCC)     COPD (chronic obstructive pulmonary disease) (CMS-HCC)     Coronary artery disease     Depression     GERD (gastroesophageal reflux disease)     Hypertension     Hypothyroid     Myasthenia gravis (CMS-HCC)     Orthostatic hypotension       Discharge Diagnoses     Active Hospital Problems    Diagnosis Date Noted    CAD, multiple vessel [I25.10] 06/13/2022      Resolved Hospital Problems   No resolved problems to display.     Operations/Procedures Performed (include dates)     Surgeries:  Surgical/Procedural Cases on this Admission       Case IDs Date Procedure Surgeon Location Status    [id number] 06/07/22 Left and Right [place], MD [facility] CARDIAC CATH LABS Comp          Lines/Drains/Airways:  Patient Lines/Drains/Airways Status       Active Line / [place]       Name Placement date Placement time Site Days    Port A Cath  Left Chest --  --  Chest  --                  Procedures:  1. LHC:   Severe 2-vessel CAD:   LM: Patent   LAD: Moderate diffuse disease with focal 80% proximal stenosis and 60% mid-vessel stenosis   D1: 80% ostial stenosis   LCX: Small, patent   RCA: Large, dominant vessel with moderate diffuse disease. 90% mid-vessel stenosis.     Consulting Services (include reason)     1. Endocrinology (hypothyoidism)  2. Neurology (Myasthenia gravis)  3. Cardiac surgery (CABG assessment)    Allergies     Beta-Blockers (Beta-Adrenergic Blocking Agts)  Calcium Channel Blocking Agent Diltiazem Analogues  Dilaudid  [Hydromorphone (Bulk)]  Lamotrigine  Magnesium      Discharge Medications        Medication List         TAKE these medications, which are NEW        Quantity/Refills   naloxone 4 mg/actuation Spry  Commonly known as: NARCAN  Apply 1 spray in one nostril if needed. Call 911. May repeat dose in other nostril if no response in 3 minutes.   Quantity: 2 each  Refills: 1     pyridostigmine 60 mg tablet  Commonly known as: MESTINON  Take 1 tablet (60 mg total) by mouth 2 times a day.   Quantity: 90 tablet  Refills: 0     ticagrelor 90 mg Tab tablet  Commonly known as: BRILINTA  Take 1 tablet (90 mg total) by mouth 2 times a day.   Quantity: 60 tablet  Refills: 1            TAKE these medication, which have CHANGED        Quantity/Refills   baclofen 10 MG tablet  Commonly known as: LIORESAL  Take 2 tablets (20 mg total) by mouth daily as needed (for  muscle spasm).  What changed:   how much to take  when to take this  reasons to take this   Quantity: 30 tablet  Refills: 1            TAKE these medications, which you were ALREADY TAKING        Quantity/Refills   albuterol 90 mcg/actuation inhaler  Commonly known as: PROVENTIL  Inhale 2 puffs into the lungs every 6 hours as needed for Wheezing or Shortness of Breath.   Quantity: 18 g  Refills: 0     aspirin 81 MG EC tablet  Take 1 tablet (81 mg total) by mouth daily with breakfast.  Start taking on: June 14, 2022   Quantity: 30 tablet  Refills: 0     atorvastatin 40 MG tablet  Commonly known as: LIPITOR  Take by mouth.   Refills: 0     buPROPion SR 150 MG tablet  Commonly known as: WELLBUTRIN SR  Take 1 tablet (150 mg total) by mouth daily.   Quantity: 30 tablet  Refills: 1     calcium carbonate 1250 mg tablet (500 mg elemental calcium) 500 mg calcium (1,250 mg) tablet  Commonly known as: OS-CAL  Take 1 tablet (1,250 mg total) by mouth 2 times a day.   Quantity: 30 tablet  Refills: 0     cholecalciferol (vitamin D3) 1000 units tablet  Take 1 tablet (1,000 Units total) by mouth daily.  Start taking on: June 14, 2022   Quantity: 30 tablet  Refills: 0      cyanocobalamin 1000 MCG tablet  Commonly known as: VITAMIN B-12  Take 1 tablet (1,000 mcg total) by mouth daily.   Refills: 0     fluticasone propionate 50 mcg/actuation nasal spray  Commonly known as: FLONASE  Use 1 spray into each nostril in the morning and at bedtime.   Quantity: 16 g  Refills: 0     HYDROcodone-acetaminophen 7.5-325 mg per tablet  Commonly known as: NORCO  Take 1 tablet by mouth every 8 hours as needed for Pain for up to 6 doses.   Refills: 0     levothyroxine 175 MCG tablet  Commonly known as: SYNTHROID  Take 1 tablet (175 mcg total) by mouth daily.  Start taking on: June 14, 2022   Quantity: 30 tablet  Refills: 0     lidocaine 5 %  Commonly known as: LIDODERM  Place 2 patches onto the skin daily. Apply patch for 12 hours and then remove patch and leave off for 12 hours.  Start taking on: June 14, 2022   Quantity: 30 patch  Refills: 0     morphine SR 15 MG 12 hr tablet  Commonly known as: MS CONTIN  Take 1 tablet (15 mg total) by mouth at bedtime for 30 days. Does not need refills   Quantity: 30 tablet  Refills: 0     multivitamin capsule  Take 1 capsule by mouth daily.   Refills: 0     mycophenolate 500 mg tablet  Commonly known as: CELLCEPT  Take 2 tablets (1,000 mg total) by mouth 2 times a day.   Quantity: 60 tablet  Refills: 1     omeprazole 20 MG capsule  Commonly known as: PRILOSEC  Take 1 capsule (20 mg total) by mouth in the morning and at bedtime.   Quantity: 60 capsule  Refills: 0     PARoxetine 40 MG tablet  Commonly known as: PAXIL  Take 0.5 tablets (20  mg total) by mouth daily.  Start taking on: June 14, 2022   Quantity: 30 tablet  Refills: 1     propylene glycoL 0.6 % Drop  Commonly known as: SYSTANE  Place 1 drop into both eyes every 8 hours as needed (dry eyes).   Quantity: 20 mL  Refills: 1     QUEtiapine 25 MG tablet  Commonly known as: SEROQUEL  Take 0.5 tablets (12.5 mg total) by mouth at bedtime.   Quantity: 30 tablet  Refills: 1     tiotropium-olodateroL  2.5-2.5 mcg/actuation Mist  Commonly known as: STIOLO RESPIMAT  Inhale 2 puffs into the lungs daily.   Quantity: 4 g  Refills: 1     topiramate 50 MG tablet  Commonly known as: TOPAMAX  Take 1 tablet (50 mg total) by mouth in the morning and at bedtime.   Quantity: 60 tablet  Refills: 0            STOP taking these medications      azaTHIOprine 100 mg tablet  Commonly known as: IMURAN     azelastine 137 mcg (0.1 %) nasal spray  Commonly known as: ASTELIN     budesonide-formoteroL 80-4.5 mcg/actuation inhaler  Commonly known as: SYMBICORT     clopidogreL 75 mg tablet  Commonly known as: PLAVIX     Depakote 125 MG DELAYED RELEASE tablet  Generic drug: divalproex     finasteride 5 mg tablet  Commonly known as: PROSCAR     ipratropium 21 mcg (0.03 %) nasal spray  Commonly known as: ATROVENT     ipratropium-albuteroL 0.5 mg-3 mg(2.5 mg base)/3 mL nebulizer solution  Commonly known as: DUO-NEB     midodrine 10 MG tablet  Commonly known as: PROAMATINE     montelukast 10 mg tablet  Commonly known as: SINGULAIR     ondansetron 4 MG tablet  Commonly known as: ZOFRAN     ondansetron 4 mg/2 mL Soln injection  Commonly known as: ZOFRAN     pantoprazole 40 MG tablet  Commonly known as: PROTONIX     pravastatin 40 MG tablet  Commonly known as: PRAVACHOL     rivaroxaban 20 mg Tab  Commonly known as: XARELTO     tamsulosin 0.4 mg Cap  Commonly known as: FLOMAX     UNABLE TO FIND     venlafaxine 75 MG tablet  Commonly known as: EFFEXOR               Where to Get Your Medications        These medications were sent to [facility] [address]  [place], [place] [address]      Phone: [phone]   albuterol 90 mcg/actuation inhaler  aspirin 81 MG EC tablet  baclofen 10 MG tablet  buPROPion SR 150 MG tablet  calcium carbonate 1250 mg tablet (500 mg elemental calcium) 500 mg calcium (1,250 mg) tablet  cholecalciferol (vitamin D3) 1000 units tablet  fluticasone propionate 50 mcg/actuation  nasal spray  levothyroxine 175 MCG tablet  lidocaine 5 %  morphine SR 15 MG 12 hr tablet  mycophenolate 500 mg tablet  naloxone 4 mg/actuation Spry  omeprazole 20 MG capsule  PARoxetine 40 MG tablet  propylene glycoL 0.6 % Drop  pyridostigmine 60 mg tablet  QUEtiapine 25 MG tablet  ticagrelor 90 mg Tab tablet  tiotropium-olodateroL 2.5-2.5 mcg/actuation Mist  topiramate 50 MG tablet       Information about  where to get these medications is not yet available    Ask your nurse or doctor about these medications  HYDROcodone-acetaminophen 7.5-325 mg per tablet       Reason for Admission     Andre Olson is a 72 y.o. 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    Hospital Course By Problem     #CAD, severe two-vessel disease  LHC showed LAD: proximal (90%) and mid (80%) stenosis; D1: 80% ostial stenosis. RCA: mid (90%) stenosis. RHC was wnl, w/ low L-sided filling pressures, preserved cardiac output, and normal pulmonary pressures. TTE showed EF 50-55%. CT Surgery consulted, patient not a surgical candidate. He will follow-up with Dr. Rica Mote at the [facility] next week for PCI planning. He was discharged on aspirin 81 mg, atorvastatin 40 mg, brilinta 90 mg BID. No BB due to myasthenia gravis and bradycardia    #Seronegative myasthenia gravis  #Autonomic neuropathy  Neurology consulted. We monitored his respiratory status that stayed stable. Continued cellcept 1000 mg BID. He wasn't taking pyridostigmine, so started on 60 mg BID with plan to follow-up with neurology at the [facility]    #Orthostatic hypotension  Likely multifactorial from polypharmacy (med rec completed),  hypothyroidism (elevated TSH) and autonomic dysfunction. He was able to ambulate without difficulty and felt back to his baseline on day of  discharge.     #AKI  Baseline ~1.1. Improved with IVF    #Chronic pain: continue home morphine 15mg  qhs, Norco 5 - 325 PO BID PRN, baclofen 10mg  daily PRN, lidocaine patch PRN     #Thyroid papillary cancer (s/p ablation)  #Acquired hypothyroidism  TSH 62.83 (elevated), T4 0.5 (low) on admission (1/5). Endocrine consulted, continued home levothyroxine 175 mcg as he wasn't taking it correctly. He will follow-up with endocrine at [facility]    #Restless legs: topiramate 50mg  BID.    #COPD: continue home stiolo and albuterol PRN    #BPH: he has a reported hx but has not been taking tamsulosin since 2016. Discontinued on DC, particularly given orthostatic hypotension    #MDD #PTSD: Continue home bupriopion 300mg  daily, paroxetine 40mg  daily, quetiapine 12.5mg  qhs    #GERD: continue home omeprazole 20 mg BID    Condition on Discharge     1. Functional Status: Normal    2. Mental Status: Normal    3. Diet / Tube Feeding / TPN:  Diet/Nutrition Orders    Diet Regular(7)     Frequency: Effective Now     Number of Occurrences: Until Specified     Order Questions:      Suicide/Behavior Risk Modification? No     As listed above    4. Respiratory / Lines & Tubes / Wounds:  None required    5. Discharge Physical Exam:  BP 122/67 (BP Location: Left upper arm, Patient Position: Lying)   Pulse 61   Temp 97.5 F (36.4 C) (Oral)   Resp 18   Ht 6' 8 (2.032 m)   Wt 159 lb 6.4 oz (72.3 kg)   SpO2 97%   BMI 17.51 kg/m    General: Resting comfortable. No acute distress  ENT: Moist mucous membranes. No pharyngeal erythema.  Cardiovascular:  Heart is regular rate and rhythm. No murmurs. Radial and dorsalis pedis pulses are 2+ bilaterally. No lower extremity edema  Pulmonary: Normal work of breathing on room air. Lungs are clear to auscultation bilaterally. No wheezes, rales, or rhonchi  Abdomen: Bowel  sounds present. Soft and non tender. No noticeable hepatomegaly.  Neuro: Alert and oriented X4. Moves all extremities spontaneously. No sensory deficit.     Core Measure Documentation (As Applicable)     Most Recent Wt: Weight: 159 lb 6.4 oz (72.3 kg)    Disposition     Home with Home Health    Follow-Up Appointments     No future appointments.    [facility]  [address] [place] [address]  [phone]         Patient Instructions / Follow-Up Items for Receiving Physician     1. Follow-up with Dr. Richardine Service for PCI planning  2. Follow-up with endocrine at [facility] for hypothyroidism  3. Follow-up with neurology at [facility] for myasthenia gravis    Signed:    Charlton Amor, MD   06/13/2022, 1:40 PM

## 2022-06-13 NOTE — Unmapped (Incomplete)
Pt. Requesting to speak with providers regarding discharge today as he states physicians told patient he would discharge today.

## 2022-06-14 MED FILL — PYRIDOSTIGMINE BROMIDE 60 MG TABLET: 60 60 mg | ORAL | Qty: 1

## 2022-06-14 MED FILL — PAROXETINE 40 MG TABLET: 40 40 MG | ORAL | Qty: 1

## 2022-06-14 MED FILL — CELLCEPT 500 MG TABLET: 500 500 mg | ORAL | Qty: 2

## 2022-06-14 NOTE — ED Notes (Signed)
Formatting of this note might be different from the original.  Pt d/c'ed to home at this time per orders.  Pt and family verbalizes understanding of d/c instructions, home medications, and follow up. Pt denying questions, concerns or further needs.  Pt otherwise calm, skin with normal coloration and temperature and resp effort easy and stable. Pt assisted out of department in wheelchair.     Cranford Mon, RN    Electronically signed by Cranford Mon, RN at 06/14/2022 11:16 AM EST

## 2022-06-14 NOTE — ED Notes (Signed)
Formatting of this note might be different from the original.  Pt demanding that he arrived with coat. Pt reports coat was left in squad. Dispatch called and spoke with [facility]. Per medics, pt removed coat in home prior to leaving. Pt updated on medics account. Pt called spouse and spouse confirms coat is in home.   Electronically signed by Cranford Mon, RN at 06/14/2022 11:18 AM EST

## 2022-06-14 NOTE — Progress Notes (Signed)
Formatting of this note might be different from the original.                                                Discharge Planning Brief Note    Plan A:      Plan B:     Reason for Intervention: [facility] Notification ID: [id number]   Electronically signed by Jerral Bonito, RN at 06/14/2022 10:06 AM EST

## 2022-06-14 NOTE — ED Triage Notes (Signed)
Formatting of this note might be different from the original.  Pt arrived via squad for shortness of breath. Pt reports being DC from UC with "multiple blockages." Pt reports UC was unable to place stents due to thyroid disease. Pt very anxious upon arrival. Pt reports shortness of breath has been going on "for awhile."   Electronically signed by Cranford Mon, RN at 06/14/2022  8:15 AM EST

## 2022-06-14 NOTE — ED Notes (Signed)
Formatting of this note might be different from the original.  Bed: ER 21  Expected date: 06/14/22  Expected time: 8:01 AM  Means of arrival: [place]  Comments:  Aldona Lento 25  Electronically signed by Courtney Paris, RN at 06/14/2022  8:13 AM EST

## 2022-06-26 NOTE — ED Triage Notes (Signed)
Formatting of this note might be different from the original.  Pt arrives to the ER with c/o chest pain. Pt states, pain started ten minutes prior to arrival to the hospital. Pt states, He is scheduled to have stents placed in the AM at the [place]. Pt is alert and oriented, skin warm and dry at this time.  Electronically signed by Eliezer Champagne, RN at 06/26/2022  7:06 PM EST

## 2022-06-26 NOTE — ED Provider Notes (Signed)
Formatting of this note is different from the original.  Images from the original note were not included.      CHIEF COMPLAINT  Chief Complaint   Patient presents with    Chest Pain     HPI  Frank Kaiser is a 72 y.o. male who presents with several hours of chest pain and feelings of shortness of breath.  He states that his symptoms started with increasing feelings of shortness of breath, this been ongoing for several weeks, worse today.  He relates that he has slight chest pressure that has resolved.  He states that he felt warm and tingly all over which is not atypical for him when he has panic attack.  He denies having hemoptysis.  Denies unilateral calf pain or calf swelling.  He denies history of DVT or PE.  He states he has been compliant with his Brilinta.  He denies fever or chills.  He states he does not smoke but lives with a smoker.  He smelled strongly of tobacco.  He tells me that he has several coronary artery blockages and as a plan to go to the [place] tomorrow as an outpatient to receive stenting.    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    Chest pain 09/21/2014    Chicken pox     Chronic pain     Congenital heart defect     COPD (chronic obstructive pulmonary disease) (HCC)     Coronary artery disease     Emphysema of lung (HCC)     Fractures 2020    Right ankle    GERD (gastroesophageal reflux disease)     Headache     migraines    Hyperlipidemia     Hypertension     Hypothyroidism 1981    Thyroidectomy    Major depressive disorder     MRSA (methicillin resistant Staphylococcus aureus)     2014 nose    Mumps     Myasthenia gravis ([place])     Myocardial infarction ([place])     stent x1 dec 2015    Neuropathy     Orthostatic hypotension     Osteoarthritis     Osteoporosis     PE (pulmonary thromboembolism) ([place]) 2018    Peripheral neuropathy      Pneumonia     2014    PTSD (post-traumatic stress disorder)     Restless leg syndrome     Right ankle pain 05/07/2019    Open Reduction Internal Fixation Right Trimalleolar Fracture     Rubeola     Sinus congestion     Sjoegren syndrome     STD (sexually transmitted disease)     past minor and treated pt does not want to say what    Thyroid disease     Urinary tract infection      FAMILY HISTORY  Family History   Problem Relation Age of Onset    Cancer Father         Lung cancer from smoking    Heart disease Father      SOCIAL HISTORY  Social History     Socioeconomic History    Marital status: Significant Other   Tobacco Use    Smoking status:  Former     Packs/day: 2.00     Years: 25.00     Additional pack years: 0.00     Total pack years: 50.00     Types: Cigarettes, Pipe, Cigars     Quit date: 02/06/2018     Years since quitting: 4.3     Passive exposure: Never    Smokeless tobacco: Never    Tobacco comments:     Smoked up to 2 packs a day for over 20 years.   Vaping Use    Vaping Use: Never used   Substance and Sexual Activity    Alcohol use: Not Currently     Alcohol/week: 21.0 standard drinks of alcohol     Types: 21 Cans of beer per week     Comment: last drink 06/06/2022    Drug use: No    Sexual activity: Not Currently     Partners: Female     Birth control/protection: None   Other Topics Concern    Daily Caffeine Intake ? No    Do you exercise regularly ? No     SURGICAL HISTORY  Past Surgical History:   Procedure Laterality Date    ANKLE FRACTURE SURGERY  2020    CARDIAC SURGERY      stents dec 2013-multi-LINK vision stent-spatial gradient <3300    CORONARY ANGIOPLASTY WITH STENT PLACEMENT  2013    x 1    EKG STRESS TEST-NUCLEAR MED  05/09/2016      ELBOW SURGERY      FOOT FRACTURE SURGERY  November 2020    HAND SURGERY  2023    INSERTION CENTRAL VENOUS ACCESS DEVICE W/ SUBCUTANEOUS PORT      left side march 2016    NEUROPLASTY / TRANSPOSITION ULNAR NERVE AT ELBOW      right arm    ORIF ANKLE FRACTURE  Right 05/07/2019    Open Reduction Internal Fixation Right Trimalleolar Fracture -Dr. Delana Meyer    SHOULDER ARTHROSCOPY  2017    SHOULDER SURGERY  2017    STRESS TEST LEXISCAN  12/14/2013      THYROIDECTOMY      TURP      urodynamics study  05/24/2014     CURRENT MEDICATIONS  No current facility-administered medications for this encounter.    Current Outpatient Medications:     albuterol (ACCUNEB) 1.25 mg/3 mL nebulizer solution, Inhale 3 mLs (1.25 mg total) via nebulizer every 6 hours as needed for Wheezing. (Patient not taking: Reported on 12/31/2021), Disp: 60 vial, Rfl: 6    aspirin (ASPIR-81) 81 mg enteric coated tablet, Take 1 tablet by mouth daily., Disp: 30 tablet, Rfl: 0    atorvastatin (LIPITOR) 40 mg tablet, Take 1 tablet (40 mg total) by mouth daily., Disp: , Rfl:     baclofen (LIORESAL) 10 mg tablet, Take 2 tablets (20 mg total) by mouth daily as needed. (Patient not taking: Reported on 06/26/2022), Disp: , Rfl:     buPROPion (WELLBUTRIN XL) 150 mg 24 hr tablet, 1 tablet (150 mg total)., Disp: , Rfl:     calcium carbonate-vitamin D (OSCAL D) 500 mg(1,250mg ) -200 unit per tablet, Take 1 tablet by mouth daily., Disp: , Rfl:     cyanocobalamin (B-12) 1,000 mcg tablet, Take 1 tablet (1,000 mcg total) by mouth daily., Disp: , Rfl:     fluticasone (FLONASE) 50 mcg/actuation nasal spray, 1 spray into each nostril in the morning and 1 spray before bedtime., Disp: , Rfl:     HYDROcodone-acetaminophen (NORCO)  5-325 mg tablet, Take 1 tablet by mouth every 8 hours as needed for Pain., Disp: , Rfl:     levothyroxine (SYNTHROID) 175 mcg tablet, Take 1 tablet (175 mcg total) by mouth daily., Disp: , Rfl:     lidocaine (LIDODERM) 5% (700 mg/patch) transdermal patch, Apply 2 patches onto the skin daily., Disp: , Rfl:     morphine (MSIR) 15 mg immediate release tablet, Take 1 tablet (15 mg total) by mouth every 4 hours as needed for Pain., Disp: , Rfl:     multivitamin (THERAGRAN) tablet, Take 1 tablet by mouth daily.,  Disp: , Rfl:     mycophenolate (CELLCEPT) 500 mg tablet, 2 tablets (1,000 mg total)., Disp: , Rfl:     naloxone (NARCAN) 4 mg/actuation Spry spray, Take device out of package. Place tip of nozzle into nostril until your fingers touch the person's nose. Press the plunger firmly to give dose. You do not need to give another dose in the other nostril. If you can, continue to give rescue breaths every 5 seconds as needed. If the patient does not respond or responds and then relapses into respiratory depression, additional doses may be given every 2 to 3 minutes using a new device with each dose, until emergency medical assistance arrives., Disp: , Rfl:     omeprazole (PRILOSEC) 40 mg delayed-release capsule, Take 1 capsule by mouth every 12 hours., Disp: 60 capsule, Rfl: 0    paroxetine (PAXIL) 40 mg tablet, 0.5 tablets (20 mg total)., Disp: , Rfl:     pyridostigmine (MESTINON) 60 mg tablet, Take 1 tablet by mouth every morning. (Patient taking differently: Take 1 tablet (60 mg total) by mouth in the morning and 1 tablet (60 mg total) before bedtime.), Disp: 90 tablet, Rfl: 0    QUEtiapine (SEROQUEL) 25 mg tablet, TAKE ONE-HALF TO ONE TABLET BY MOUTH AT BEDTIME FOR ANXIETY, Disp: , Rfl:     ticagrelor (BRILINTA) 90 mg tablet, Take 1 tablet (90 mg total) by mouth in the morning and 1 tablet (90 mg total) before bedtime., Disp: , Rfl:     tiotropium-olodaterol (STIOLTO RESPIMAT) 2.5-2.5 mcg/actuation Mist, Inhale into the lungs., Disp: , Rfl:     topiramate (TOPAMAX) 15 mg delayed release capsule, Take 1 capsule (15 mg total) by mouth in the morning and 1 capsule (15 mg total) before bedtime., Disp: , Rfl:     PHYSICAL EXAM  VITAL SIGNS: BP 134/77   Pulse 63   Temp 97.5 F (36.4 C)   Resp 14   Wt 150 lb (68 kg)   SpO2 98%   BMI 19.79 kg/m  ,   Constitutional: He is alert and pleasant.  He is able to speak calmly in long sentences,   CDC criteria for BMI < 18.5 = underweight ; 18.5 to <25 = normal, 25.0 to <30=  overweight range. 30 - 39 = obese . 40 or greater = severe obesity    HENT: Normocephalic, Atraumatic, Bilateral external ears normal, Oropharynx moist, No oral exudates, Nose normal.   Eyes: PERRLA, EOMI, Conjunctiva normal, No discharge.   Neck: Normal range of motion, No tenderness, Supple, No stridor.   Lymphatic: No lymphadenopathy noted.   Cardiovascular: Normal heart rate, Normal rhythm, No murmurs, No rubs, No gallops.   Thorax & Lungs: Normal breath sounds, No respiratory distress, No wheezing, No chest tenderness.   Abdomen:  Soft, No tenderness, No masses, No pulsatile masses.   Skin: Warm, Dry, No erythema, No rash.  Back: No tenderness, No CVA tenderness.     Extremities: Intact distal pulses, No edema, No tenderness, No cyanosis, No clubbing.   Musculoskeletal: Good range of motion in all major joints. No tenderness to palpation or major deformities noted.  There are no palpable cords.  There is no calf tenderness.  Calf girth is equal bilaterally.  There is no pallor or coolness or pulse deficit.  Neurologic: Alert & oriented x 3, Normal motor function, Normal sensory function, No focal deficits noted.   Psychiatric: Affect normal, Judgment normal, Mood normal.     RADIOLOGY/PROCEDURES  XR-CHEST PORTABLE STAT   Final Result     No acute cardiopulmonary findings.     Electronically signed by Dione HousekeeperPhong Ha, MD         BASIC METABOLIC PANEL - Abnormal; Notable for the following components:       Result Value Ref Range Status    Chloride 111 (*) 98 - 107 mmol/L Final    CO2 19 (*) 21 - 31 mmol/L Final    Anion Gap 6 (*) 7 - 16 mmol/L Final    Calcium 8.4 (*) 8.6 - 10.2 mg/dL Final    All other components within normal limits    Narrative:     KDIGO 2012 GFR Categories                    Stage Description    eGFR (mL/min/1.6873m2)                    G1 Normal or high    >=90                  G2 Mildly decreased   60-89                  G3a Mildly to moderately decreased 45-59                  G3b Moderately to  severely decreased 30-44                  G4 Severely decreased   15-29                  G5 Kidney Failure    <15   HS TROPONIN I - Normal    Narrative:     The Access high sensitivity troponin assay is not intended to be used in isolation; results should be interpreted in conjunction with other diagnostic tests and clinical information.        LIVER PROFILE PANEL - Normal   D-DIMER, QUANTITATIVE - Normal    Narrative:     CUTOFF FOR EXCLUSION OF DVT/PE = 230 ng/ml(DDU) (see below)                  The quantitative D-dimer test, in conjunction with a clinical pretest                  probability (PTP) assessment model, can be used to exclude venous                  thromboembolism in outpatients suspected of DVT and PE. A positive                  D-dimer alone is not diagnostic of DVT. Studies have shown that                  D-dimer levels below a certain  cutoff value can be used to exclude                  DVT and PE in the acute care setting when correlated with the PTP.                  This assay expresses D-dimer  results in D-dimer units (D-DU).                    Due to the lack of an International Reference Standard some manufacturers                   express D-Dimer results in FEU (Fibrinogen Equivalent Units).  The                   equivalence between these two measurement units is                  approximately 2 ng/ml FEU ~ 1 ng/ml D-DU.                    Marylyn Ishihara Hemost 2012; [phone].   HS TROPONIN I - Normal    Narrative:     The Access high sensitivity troponin assay is not intended to be used in isolation; results should be interpreted in conjunction with other diagnostic tests and clinical information.        CBC W/DIFF   SARS-COV2-2 RAPID TEST   RSV BY NAAT   INFLUENZA A/B BY NEAR     Nursing Notes were reviewed    COURSE & MEDICAL DECISION MAKING  Pertinent Labs & Imaging studies reviewed. (See chart for details)      MEDICAL DECISION MAKING     Number and Complexity of Problems  Differential Diagnosis: The patient arrives today with numerous risk factors for coronary artery disease, history of COPD, etc. consideration given to an etiology such as acute coronary syndrome, pulmonary embolism, pneumonia, pneumothorax, aortic dissection, ruptured aortic aneurysm, acute abdomen, esophageal tear, etc.    MDM Data    External documents reviewed: Yes, described elsewhere  My EKG interpretation: Sinus rhythm, rate of 58  The axis is normal [place] QTc is 123  Minimal nonspecific ST segment abnormalities, nonspecific T wave abnormalities  Abnormal R wave progression, question age-indeterminate anteroseptal infarct  This EKG was compared to prior EKG performed on June 14, 2022, nonspecific changes noted, there are some changes in the R wave progression.  Prolonged QTc is normalized.  My X-ray interpretation: Nonacute  Heart score would estimate moderate risk for adverse outcome with outpatient management.  Discussed with: The patient's family member    Treatment and Disposition  Medications   aspirin (BAYER ASPIRIN) 325 mg tablet 325 mg (325 mg Oral Given 06/26/22 2046)     2347  Patient reassessed for the fourth time, updated on final results.  I have also discussed the case thoroughly with Dr. Erik Obey from [facility] who kindly agrees to except this patient to his service.  Currently arranging for transport.    Mr. Daubert arrives today with numerous risk factors for coronary artery disease, serial troponins are undetectable, I would estimate low to moderate pretest probably for pulmonary embolism, his D-dimer is undetectable as well.  No obvious acute ischemic EKG changes.  Case has been discussed thoroughly with the hospitalist at [facility], he is requesting transfer to [facility] as his preference.  He currently appears very appropriate for ground transport.  Shared decision making: The implications of the patient's test results was reviewed with the patient and/or family at the bedside.   They were counseled on the risk benefits indications for the proposed treatment plans and decided according to their preferences      Code Status: Prior     Patient was given precautions and reasons to return to the ED  New Prescriptions    No medications on file     FINAL IMPRESSION  1. Acute chest pain    2. History of coronary artery disease      Electronically signed by: Kathi Simpers, MD, 06/27/2022 12:38 AM      Kathi Simpers, MD  06/27/22 [id number]    Electronically signed by Kathi Simpers, MD at 06/27/2022 12:39 AM EST
